# Patient Record
Sex: Female | Born: 1975 | Race: Black or African American | Hispanic: No | Marital: Single | Smoking: Never smoker
Health system: Southern US, Community
[De-identification: ages and names within clinical notes are randomized; demographics above are authoritative.]

## PROBLEM LIST (undated history)

## (undated) DIAGNOSIS — Z6830 Body mass index (BMI) 30.0-30.9, adult: Secondary | ICD-10-CM

## (undated) DIAGNOSIS — F419 Anxiety disorder, unspecified: Secondary | ICD-10-CM

## (undated) DIAGNOSIS — D259 Leiomyoma of uterus, unspecified: Secondary | ICD-10-CM

## (undated) DIAGNOSIS — G35 Multiple sclerosis: Secondary | ICD-10-CM

## (undated) DIAGNOSIS — F32A Depression, unspecified: Secondary | ICD-10-CM

## (undated) DIAGNOSIS — I10 Essential (primary) hypertension: Secondary | ICD-10-CM

## (undated) DIAGNOSIS — G35D Multiple sclerosis, unspecified: Secondary | ICD-10-CM

## (undated) HISTORY — PX: ABDOMINAL HYSTERECTOMY: SHX81

## (undated) HISTORY — DX: Depression, unspecified: F32.A

## (undated) HISTORY — DX: Anxiety disorder, unspecified: F41.9

---

## 1997-10-19 ENCOUNTER — Emergency Department (HOSPITAL_COMMUNITY): Admission: EM | Admit: 1997-10-19 | Discharge: 1997-10-19 | Payer: Self-pay | Admitting: Emergency Medicine

## 1998-06-25 ENCOUNTER — Inpatient Hospital Stay (HOSPITAL_COMMUNITY): Admission: AD | Admit: 1998-06-25 | Discharge: 1998-06-25 | Payer: Self-pay | Admitting: Obstetrics & Gynecology

## 1998-06-26 ENCOUNTER — Inpatient Hospital Stay (HOSPITAL_COMMUNITY): Admission: RE | Admit: 1998-06-26 | Discharge: 1998-06-26 | Payer: Self-pay | Admitting: *Deleted

## 1998-07-14 ENCOUNTER — Encounter (HOSPITAL_COMMUNITY): Admission: RE | Admit: 1998-07-14 | Discharge: 1998-10-12 | Payer: Self-pay | Admitting: *Deleted

## 1998-08-24 ENCOUNTER — Inpatient Hospital Stay (HOSPITAL_COMMUNITY): Admission: AD | Admit: 1998-08-24 | Discharge: 1998-08-24 | Payer: Self-pay | Admitting: Obstetrics & Gynecology

## 1998-10-06 ENCOUNTER — Encounter: Payer: Self-pay | Admitting: *Deleted

## 1998-10-10 ENCOUNTER — Encounter: Payer: Self-pay | Admitting: Endocrinology

## 1998-10-10 ENCOUNTER — Emergency Department (HOSPITAL_COMMUNITY): Admission: EM | Admit: 1998-10-10 | Discharge: 1998-10-10 | Payer: Self-pay | Admitting: Endocrinology

## 1998-10-20 ENCOUNTER — Observation Stay (HOSPITAL_COMMUNITY): Admission: AD | Admit: 1998-10-20 | Discharge: 1998-10-21 | Payer: Self-pay | Admitting: *Deleted

## 1998-10-27 ENCOUNTER — Inpatient Hospital Stay (HOSPITAL_COMMUNITY): Admission: RE | Admit: 1998-10-27 | Discharge: 1998-10-27 | Payer: Self-pay | Admitting: *Deleted

## 1998-11-03 ENCOUNTER — Inpatient Hospital Stay (HOSPITAL_COMMUNITY): Admission: AD | Admit: 1998-11-03 | Discharge: 1998-11-05 | Payer: Self-pay | Admitting: *Deleted

## 1999-02-16 ENCOUNTER — Emergency Department (HOSPITAL_COMMUNITY): Admission: EM | Admit: 1999-02-16 | Discharge: 1999-02-16 | Payer: Self-pay | Admitting: Emergency Medicine

## 1999-02-16 ENCOUNTER — Encounter: Payer: Self-pay | Admitting: Emergency Medicine

## 1999-02-18 ENCOUNTER — Emergency Department (HOSPITAL_COMMUNITY): Admission: EM | Admit: 1999-02-18 | Discharge: 1999-02-18 | Payer: Self-pay | Admitting: Emergency Medicine

## 1999-03-15 ENCOUNTER — Inpatient Hospital Stay (HOSPITAL_COMMUNITY): Admission: AD | Admit: 1999-03-15 | Discharge: 1999-03-15 | Payer: Self-pay | Admitting: Obstetrics

## 1999-04-14 ENCOUNTER — Inpatient Hospital Stay (HOSPITAL_COMMUNITY): Admission: AD | Admit: 1999-04-14 | Discharge: 1999-04-14 | Payer: Self-pay | Admitting: *Deleted

## 1999-06-02 ENCOUNTER — Inpatient Hospital Stay (HOSPITAL_COMMUNITY): Admission: AD | Admit: 1999-06-02 | Discharge: 1999-06-02 | Payer: Self-pay | Admitting: Obstetrics

## 1999-09-14 ENCOUNTER — Emergency Department (HOSPITAL_COMMUNITY): Admission: EM | Admit: 1999-09-14 | Discharge: 1999-09-14 | Payer: Self-pay | Admitting: Emergency Medicine

## 1999-09-14 ENCOUNTER — Encounter: Payer: Self-pay | Admitting: Emergency Medicine

## 1999-09-22 ENCOUNTER — Inpatient Hospital Stay (HOSPITAL_COMMUNITY): Admission: AD | Admit: 1999-09-22 | Discharge: 1999-09-22 | Payer: Self-pay | Admitting: Obstetrics

## 1999-10-28 ENCOUNTER — Inpatient Hospital Stay (HOSPITAL_COMMUNITY): Admission: AD | Admit: 1999-10-28 | Discharge: 1999-10-28 | Payer: Self-pay | Admitting: *Deleted

## 1999-12-04 ENCOUNTER — Emergency Department (HOSPITAL_COMMUNITY): Admission: EM | Admit: 1999-12-04 | Discharge: 1999-12-04 | Payer: Self-pay | Admitting: Emergency Medicine

## 2000-02-25 ENCOUNTER — Encounter (INDEPENDENT_AMBULATORY_CARE_PROVIDER_SITE_OTHER): Payer: Self-pay

## 2000-02-25 ENCOUNTER — Other Ambulatory Visit: Admission: RE | Admit: 2000-02-25 | Discharge: 2000-02-25 | Payer: Self-pay | Admitting: Obstetrics

## 2000-04-08 ENCOUNTER — Inpatient Hospital Stay (HOSPITAL_COMMUNITY): Admission: AD | Admit: 2000-04-08 | Discharge: 2000-04-08 | Payer: Self-pay | Admitting: Obstetrics

## 2000-05-05 ENCOUNTER — Emergency Department (HOSPITAL_COMMUNITY): Admission: EM | Admit: 2000-05-05 | Discharge: 2000-05-05 | Payer: Self-pay | Admitting: Emergency Medicine

## 2000-09-09 ENCOUNTER — Inpatient Hospital Stay (HOSPITAL_COMMUNITY): Admission: AD | Admit: 2000-09-09 | Discharge: 2000-09-09 | Payer: Self-pay | Admitting: *Deleted

## 2000-09-12 ENCOUNTER — Inpatient Hospital Stay (HOSPITAL_COMMUNITY): Admission: AD | Admit: 2000-09-12 | Discharge: 2000-09-12 | Payer: Self-pay | Admitting: *Deleted

## 2000-10-27 ENCOUNTER — Emergency Department (HOSPITAL_COMMUNITY): Admission: EM | Admit: 2000-10-27 | Discharge: 2000-10-27 | Payer: Self-pay | Admitting: Emergency Medicine

## 2000-11-16 ENCOUNTER — Inpatient Hospital Stay (HOSPITAL_COMMUNITY): Admission: AD | Admit: 2000-11-16 | Discharge: 2000-11-16 | Payer: Self-pay | Admitting: Obstetrics & Gynecology

## 2000-11-27 ENCOUNTER — Inpatient Hospital Stay (HOSPITAL_COMMUNITY): Admission: AD | Admit: 2000-11-27 | Discharge: 2000-11-27 | Payer: Self-pay | Admitting: Obstetrics

## 2000-12-11 ENCOUNTER — Inpatient Hospital Stay (HOSPITAL_COMMUNITY): Admission: AD | Admit: 2000-12-11 | Discharge: 2000-12-11 | Payer: Self-pay | Admitting: Obstetrics

## 2001-03-01 ENCOUNTER — Inpatient Hospital Stay (HOSPITAL_COMMUNITY): Admission: AD | Admit: 2001-03-01 | Discharge: 2001-03-04 | Payer: Self-pay | Admitting: *Deleted

## 2001-03-01 ENCOUNTER — Encounter: Payer: Self-pay | Admitting: *Deleted

## 2001-03-09 ENCOUNTER — Encounter: Payer: Self-pay | Admitting: *Deleted

## 2001-03-09 ENCOUNTER — Inpatient Hospital Stay (HOSPITAL_COMMUNITY): Admission: AD | Admit: 2001-03-09 | Discharge: 2001-03-09 | Payer: Self-pay | Admitting: *Deleted

## 2001-03-22 ENCOUNTER — Encounter (INDEPENDENT_AMBULATORY_CARE_PROVIDER_SITE_OTHER): Payer: Self-pay

## 2001-03-22 ENCOUNTER — Inpatient Hospital Stay (HOSPITAL_COMMUNITY): Admission: AD | Admit: 2001-03-22 | Discharge: 2001-04-01 | Payer: Self-pay | Admitting: Obstetrics

## 2001-03-22 ENCOUNTER — Encounter: Payer: Self-pay | Admitting: Obstetrics

## 2001-04-20 ENCOUNTER — Emergency Department (HOSPITAL_COMMUNITY): Admission: EM | Admit: 2001-04-20 | Discharge: 2001-04-20 | Payer: Self-pay | Admitting: Emergency Medicine

## 2001-05-02 ENCOUNTER — Emergency Department (HOSPITAL_COMMUNITY): Admission: EM | Admit: 2001-05-02 | Discharge: 2001-05-02 | Payer: Self-pay

## 2001-08-07 ENCOUNTER — Emergency Department (HOSPITAL_COMMUNITY): Admission: EM | Admit: 2001-08-07 | Discharge: 2001-08-07 | Payer: Self-pay | Admitting: Emergency Medicine

## 2001-09-02 ENCOUNTER — Emergency Department (HOSPITAL_COMMUNITY): Admission: EM | Admit: 2001-09-02 | Discharge: 2001-09-02 | Payer: Self-pay | Admitting: Emergency Medicine

## 2001-10-17 ENCOUNTER — Emergency Department (HOSPITAL_COMMUNITY): Admission: EM | Admit: 2001-10-17 | Discharge: 2001-10-17 | Payer: Self-pay | Admitting: Emergency Medicine

## 2001-11-02 ENCOUNTER — Emergency Department (HOSPITAL_COMMUNITY): Admission: EM | Admit: 2001-11-02 | Discharge: 2001-11-02 | Payer: Self-pay | Admitting: Emergency Medicine

## 2001-12-06 ENCOUNTER — Emergency Department (HOSPITAL_COMMUNITY): Admission: EM | Admit: 2001-12-06 | Discharge: 2001-12-06 | Payer: Self-pay | Admitting: Emergency Medicine

## 2002-02-22 ENCOUNTER — Inpatient Hospital Stay (HOSPITAL_COMMUNITY): Admission: AD | Admit: 2002-02-22 | Discharge: 2002-02-22 | Payer: Self-pay | Admitting: Family Medicine

## 2002-03-11 ENCOUNTER — Inpatient Hospital Stay (HOSPITAL_COMMUNITY): Admission: AD | Admit: 2002-03-11 | Discharge: 2002-03-11 | Payer: Self-pay | Admitting: *Deleted

## 2002-03-23 ENCOUNTER — Inpatient Hospital Stay (HOSPITAL_COMMUNITY): Admission: AD | Admit: 2002-03-23 | Discharge: 2002-03-23 | Payer: Self-pay | Admitting: *Deleted

## 2002-04-15 ENCOUNTER — Inpatient Hospital Stay (HOSPITAL_COMMUNITY): Admission: AD | Admit: 2002-04-15 | Discharge: 2002-04-15 | Payer: Self-pay | Admitting: *Deleted

## 2002-04-23 ENCOUNTER — Encounter (HOSPITAL_COMMUNITY): Admission: RE | Admit: 2002-04-23 | Discharge: 2002-05-23 | Payer: Self-pay | Admitting: *Deleted

## 2002-05-25 ENCOUNTER — Encounter: Payer: Self-pay | Admitting: *Deleted

## 2002-05-25 ENCOUNTER — Inpatient Hospital Stay (HOSPITAL_COMMUNITY): Admission: AD | Admit: 2002-05-25 | Discharge: 2002-05-25 | Payer: Self-pay | Admitting: Family Medicine

## 2002-06-11 ENCOUNTER — Emergency Department (HOSPITAL_COMMUNITY): Admission: EM | Admit: 2002-06-11 | Discharge: 2002-06-11 | Payer: Self-pay | Admitting: Emergency Medicine

## 2002-06-11 ENCOUNTER — Encounter: Payer: Self-pay | Admitting: Emergency Medicine

## 2002-06-12 ENCOUNTER — Ambulatory Visit (HOSPITAL_COMMUNITY): Admission: RE | Admit: 2002-06-12 | Discharge: 2002-06-12 | Payer: Self-pay | Admitting: *Deleted

## 2002-06-12 ENCOUNTER — Encounter: Admission: RE | Admit: 2002-06-12 | Discharge: 2002-06-12 | Payer: Self-pay | Admitting: *Deleted

## 2002-06-19 ENCOUNTER — Encounter: Admission: RE | Admit: 2002-06-19 | Discharge: 2002-06-19 | Payer: Self-pay | Admitting: *Deleted

## 2002-06-26 ENCOUNTER — Encounter: Admission: RE | Admit: 2002-06-26 | Discharge: 2002-06-26 | Payer: Self-pay | Admitting: *Deleted

## 2002-06-27 ENCOUNTER — Inpatient Hospital Stay (HOSPITAL_COMMUNITY): Admission: AD | Admit: 2002-06-27 | Discharge: 2002-06-27 | Payer: Self-pay | Admitting: *Deleted

## 2002-06-28 ENCOUNTER — Inpatient Hospital Stay (HOSPITAL_COMMUNITY): Admission: AD | Admit: 2002-06-28 | Discharge: 2002-06-28 | Payer: Self-pay | Admitting: *Deleted

## 2002-07-11 ENCOUNTER — Encounter: Admission: RE | Admit: 2002-07-11 | Discharge: 2002-07-11 | Payer: Self-pay | Admitting: *Deleted

## 2002-08-01 ENCOUNTER — Encounter: Admission: RE | Admit: 2002-08-01 | Discharge: 2002-08-01 | Payer: Self-pay | Admitting: *Deleted

## 2002-08-15 ENCOUNTER — Encounter: Admission: RE | Admit: 2002-08-15 | Discharge: 2002-08-15 | Payer: Self-pay | Admitting: *Deleted

## 2002-08-17 ENCOUNTER — Inpatient Hospital Stay (HOSPITAL_COMMUNITY): Admission: AD | Admit: 2002-08-17 | Discharge: 2002-08-17 | Payer: Self-pay | Admitting: *Deleted

## 2002-08-17 ENCOUNTER — Encounter: Payer: Self-pay | Admitting: *Deleted

## 2002-08-19 ENCOUNTER — Inpatient Hospital Stay (HOSPITAL_COMMUNITY): Admission: AD | Admit: 2002-08-19 | Discharge: 2002-08-19 | Payer: Self-pay | Admitting: Obstetrics and Gynecology

## 2002-08-29 ENCOUNTER — Encounter (HOSPITAL_COMMUNITY): Admission: RE | Admit: 2002-08-29 | Discharge: 2002-09-28 | Payer: Self-pay | Admitting: *Deleted

## 2002-08-29 ENCOUNTER — Encounter: Admission: RE | Admit: 2002-08-29 | Discharge: 2002-08-29 | Payer: Self-pay | Admitting: *Deleted

## 2002-09-05 ENCOUNTER — Inpatient Hospital Stay (HOSPITAL_COMMUNITY): Admission: AD | Admit: 2002-09-05 | Discharge: 2002-09-05 | Payer: Self-pay | Admitting: Family Medicine

## 2002-09-12 ENCOUNTER — Encounter: Admission: RE | Admit: 2002-09-12 | Discharge: 2002-09-12 | Payer: Self-pay | Admitting: Family Medicine

## 2002-09-20 ENCOUNTER — Inpatient Hospital Stay (HOSPITAL_COMMUNITY): Admission: AD | Admit: 2002-09-20 | Discharge: 2002-09-20 | Payer: Self-pay | Admitting: *Deleted

## 2002-09-23 ENCOUNTER — Inpatient Hospital Stay (HOSPITAL_COMMUNITY): Admission: AD | Admit: 2002-09-23 | Discharge: 2002-09-23 | Payer: Self-pay | Admitting: Family Medicine

## 2002-09-26 ENCOUNTER — Encounter: Admission: RE | Admit: 2002-09-26 | Discharge: 2002-09-26 | Payer: Self-pay | Admitting: Family Medicine

## 2002-10-03 ENCOUNTER — Encounter: Admission: RE | Admit: 2002-10-03 | Discharge: 2002-10-03 | Payer: Self-pay | Admitting: Family Medicine

## 2002-10-04 ENCOUNTER — Inpatient Hospital Stay (HOSPITAL_COMMUNITY): Admission: AD | Admit: 2002-10-04 | Discharge: 2002-10-04 | Payer: Self-pay | Admitting: Family Medicine

## 2002-10-04 ENCOUNTER — Encounter: Payer: Self-pay | Admitting: Family Medicine

## 2002-10-06 ENCOUNTER — Inpatient Hospital Stay (HOSPITAL_COMMUNITY): Admission: AD | Admit: 2002-10-06 | Discharge: 2002-10-06 | Payer: Self-pay | Admitting: Family Medicine

## 2002-10-08 ENCOUNTER — Inpatient Hospital Stay (HOSPITAL_COMMUNITY): Admission: AD | Admit: 2002-10-08 | Discharge: 2002-10-08 | Payer: Self-pay | Admitting: *Deleted

## 2002-10-10 ENCOUNTER — Encounter: Admission: RE | Admit: 2002-10-10 | Discharge: 2002-10-10 | Payer: Self-pay | Admitting: *Deleted

## 2002-10-17 ENCOUNTER — Encounter: Admission: RE | Admit: 2002-10-17 | Discharge: 2002-10-17 | Payer: Self-pay | Admitting: Family Medicine

## 2002-10-24 ENCOUNTER — Encounter: Admission: RE | Admit: 2002-10-24 | Discharge: 2002-10-24 | Payer: Self-pay | Admitting: Family Medicine

## 2002-10-27 ENCOUNTER — Inpatient Hospital Stay (HOSPITAL_COMMUNITY): Admission: AD | Admit: 2002-10-27 | Discharge: 2002-10-27 | Payer: Self-pay | Admitting: *Deleted

## 2002-10-28 ENCOUNTER — Encounter: Admission: RE | Admit: 2002-10-28 | Discharge: 2002-10-28 | Payer: Self-pay | Admitting: *Deleted

## 2002-10-31 ENCOUNTER — Encounter (INDEPENDENT_AMBULATORY_CARE_PROVIDER_SITE_OTHER): Payer: Self-pay | Admitting: *Deleted

## 2002-10-31 ENCOUNTER — Encounter: Admission: RE | Admit: 2002-10-31 | Discharge: 2002-10-31 | Payer: Self-pay | Admitting: *Deleted

## 2002-10-31 ENCOUNTER — Inpatient Hospital Stay (HOSPITAL_COMMUNITY): Admission: AD | Admit: 2002-10-31 | Discharge: 2002-11-03 | Payer: Self-pay | Admitting: Obstetrics & Gynecology

## 2003-04-02 ENCOUNTER — Inpatient Hospital Stay (HOSPITAL_COMMUNITY): Admission: AD | Admit: 2003-04-02 | Discharge: 2003-04-02 | Payer: Self-pay | Admitting: *Deleted

## 2003-06-18 ENCOUNTER — Emergency Department (HOSPITAL_COMMUNITY): Admission: EM | Admit: 2003-06-18 | Discharge: 2003-06-19 | Payer: Self-pay | Admitting: Emergency Medicine

## 2003-06-20 ENCOUNTER — Emergency Department (HOSPITAL_COMMUNITY): Admission: EM | Admit: 2003-06-20 | Discharge: 2003-06-21 | Payer: Self-pay | Admitting: Emergency Medicine

## 2003-07-18 ENCOUNTER — Emergency Department (HOSPITAL_COMMUNITY): Admission: EM | Admit: 2003-07-18 | Discharge: 2003-07-19 | Payer: Self-pay | Admitting: Emergency Medicine

## 2003-07-19 ENCOUNTER — Emergency Department (HOSPITAL_COMMUNITY): Admission: AD | Admit: 2003-07-19 | Discharge: 2003-07-19 | Payer: Self-pay

## 2003-09-16 ENCOUNTER — Inpatient Hospital Stay (HOSPITAL_COMMUNITY): Admission: AD | Admit: 2003-09-16 | Discharge: 2003-09-16 | Payer: Self-pay | Admitting: *Deleted

## 2003-09-16 IMAGING — US US OB TRANSVAGINAL MODIFY
1 series · 18 of 28 positions shown · non-contrast
Comparison: none

CLINICAL DATA: Pelvic pain radiating to back.  Positive home pregnancy test.  4 week 4 day gestational age by LMP.
 OBSTETRICAL ULTRASOUND WITH TRANSVAGINAL
 The uterus is retroverted.  A single tiny intrauterine gestational sac is seen with a double decidual sac sign.  Mean sac diameter measures 3 mm, corresponding to a gestational age of 4 weeks 5 days.  There is no evidence of subchorionic hemorrhage.  A submucosal fibroid is seen in the posterior uterine wall measuring 1.7 x 1.3 x 1.1 cm.  No other fibroids are identified.
 The right ovary contains an avascular heterogeneous area measuring approximately 2 cm in greatest diameter, consistent with a hemorrhagic corpus luteum.  The left ovary is normal in appearance.  There is no evidence of other adnexal masses or free fluid.
 IMPRESSION 
 Single living intrauterine gestational sac with estimated gestational age of 4 weeks 5 days by mean sac diameter.  This correlates closely with stated LMP.  
 Retroverted uterus.  1.7 cm submucosal fibroid in the posterior uterine wall.  
 2 cm right ovarian corpus luteum.  No evidence of other adnexal masses or free fluid.

[Series 1: us ob comp<14 wk · 18 of 54 slices shown]
[im 1/54]
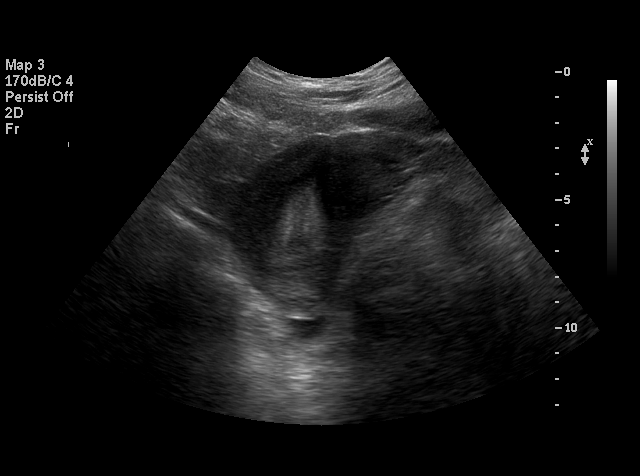
[im 4/54]
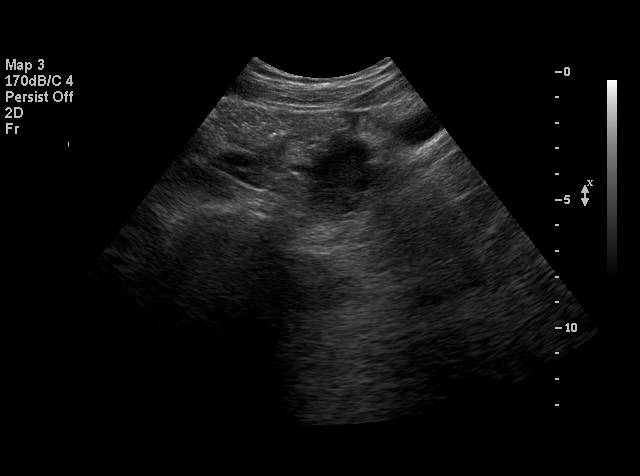
[im 6/54]
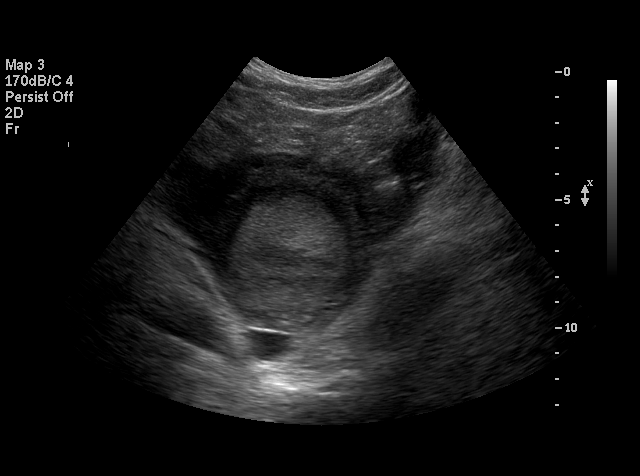
[im 10/54]
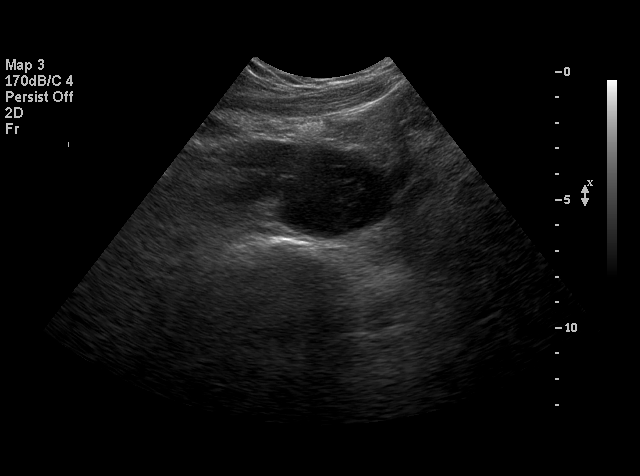
[im 14/54]
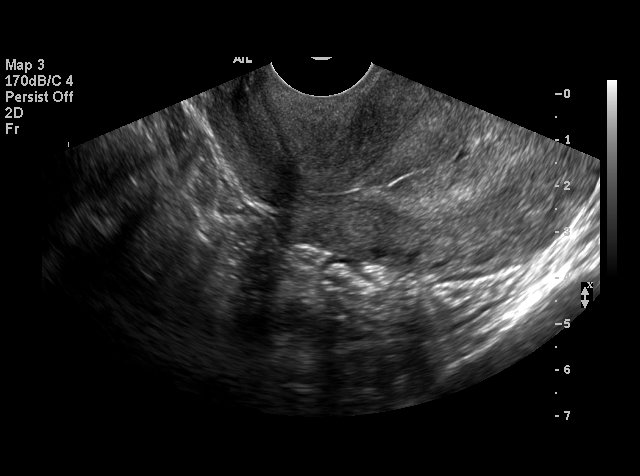
[im 16/54]
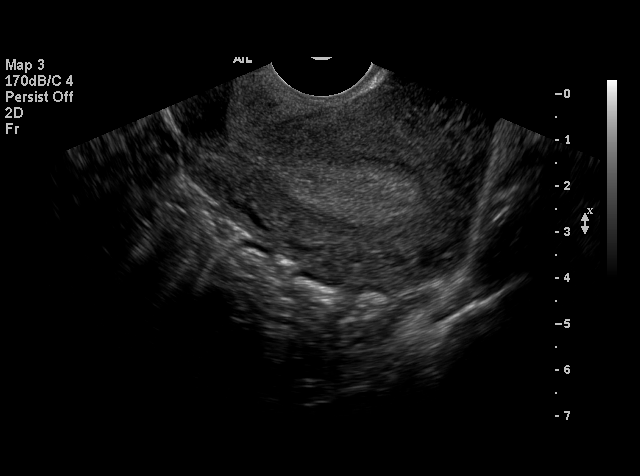
[im 20/54]
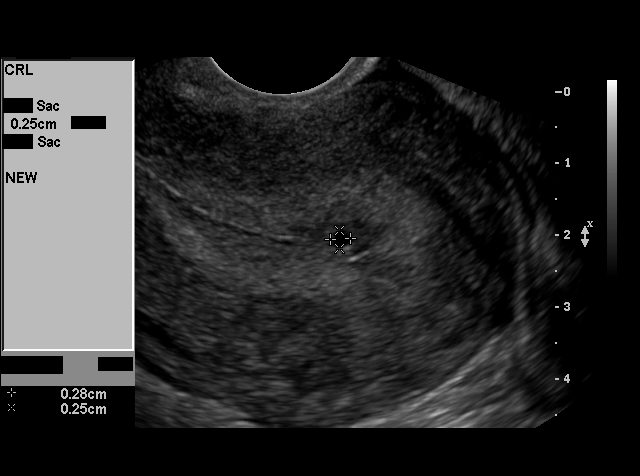
[im 22/54]
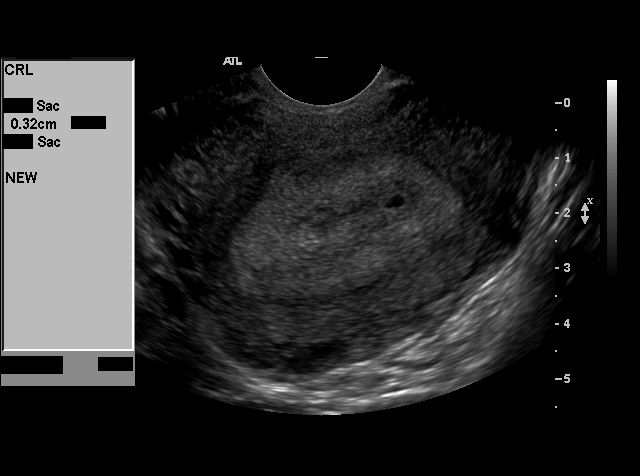
[im 26/54]
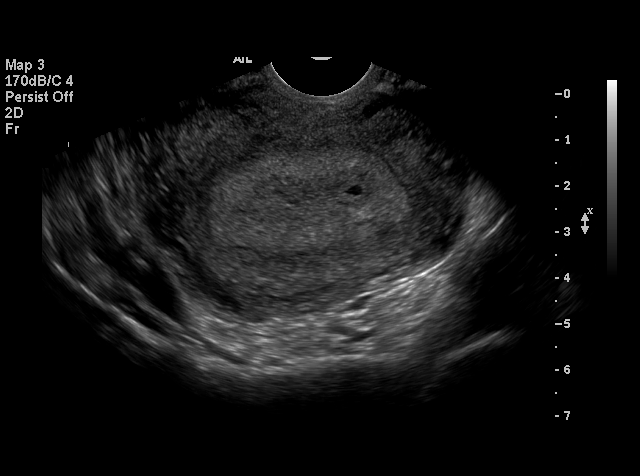
[im 28/54]
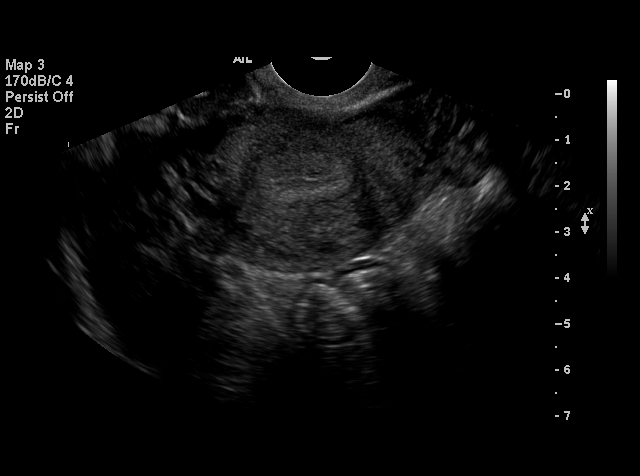
[im 32/54]
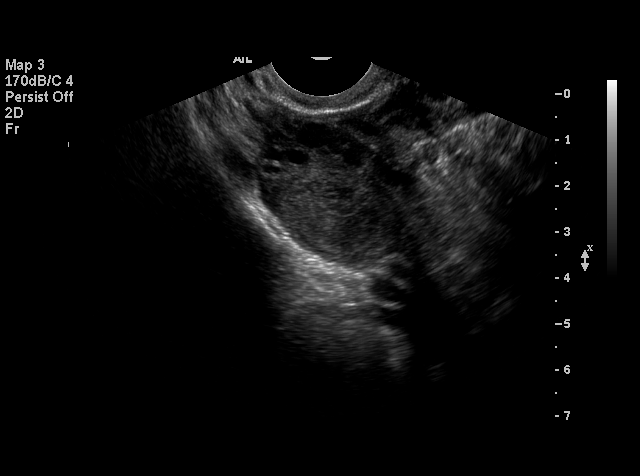
[im 34/54]
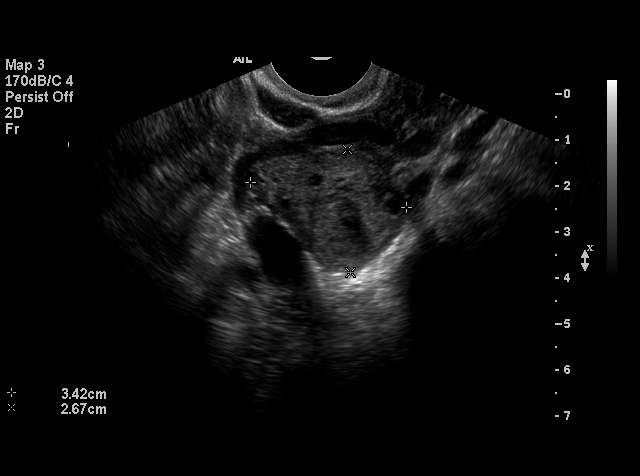
[im 38/54]
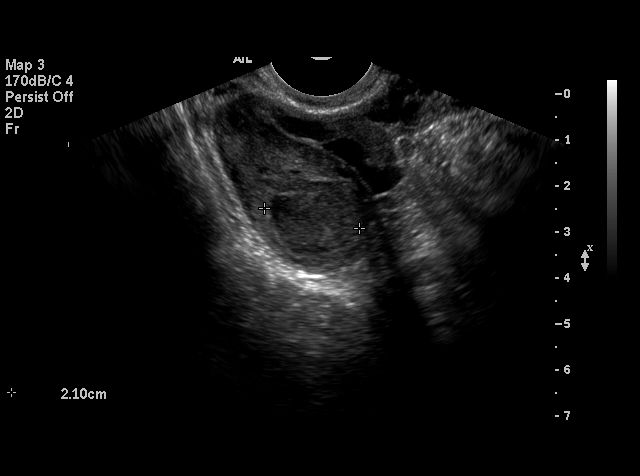
[im 42/54]
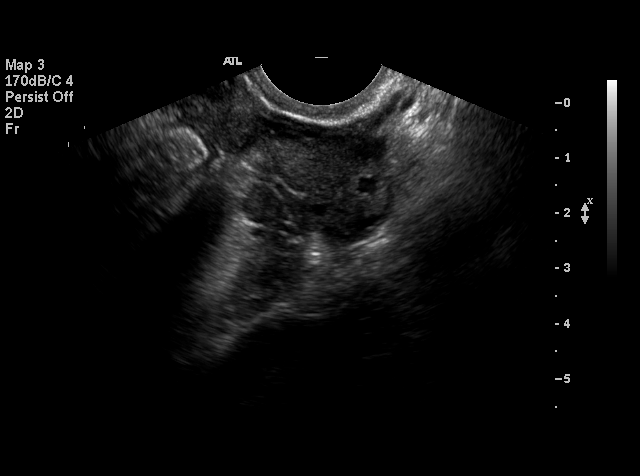
[im 44/54]
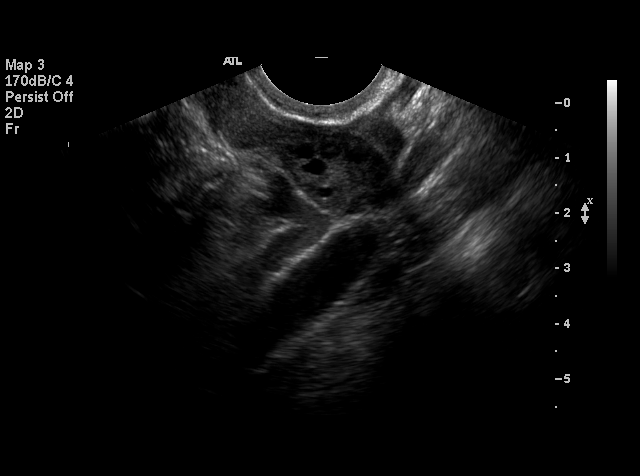
[im 48/54]
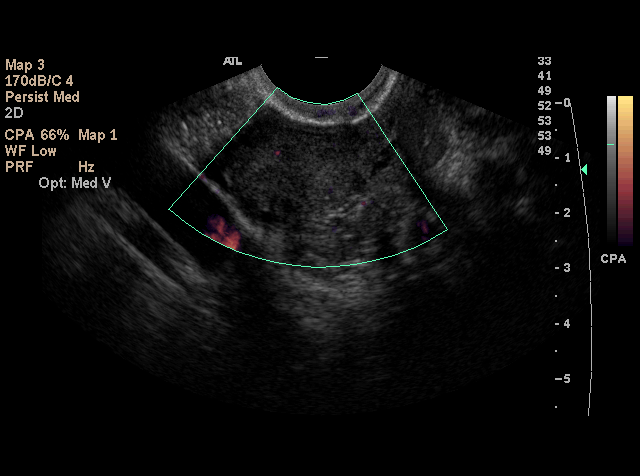
[im 50/54]
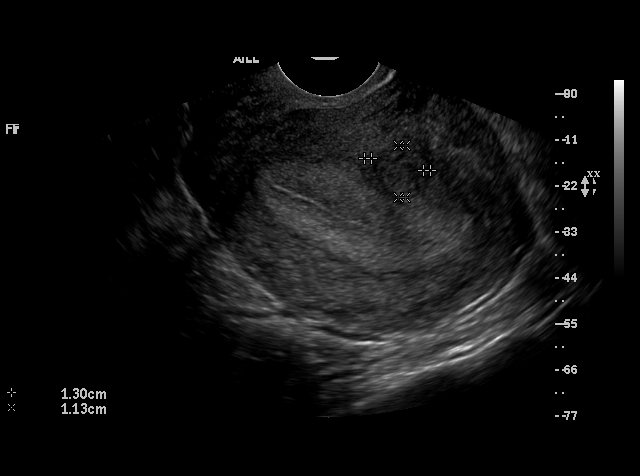
[im 54/54]
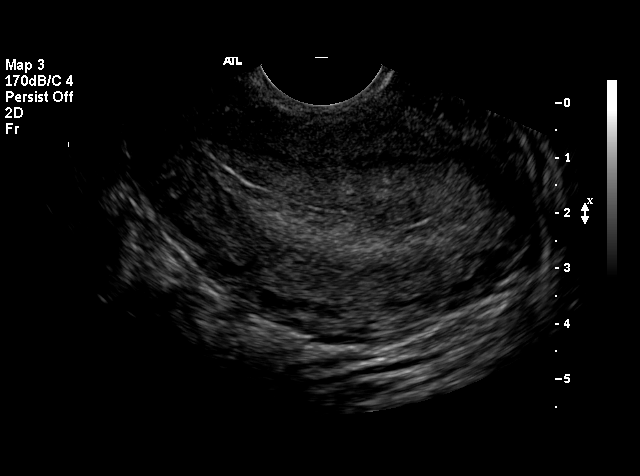

[18 of 28 positions shown; findings below may reference images not displayed]

## 2003-10-21 ENCOUNTER — Inpatient Hospital Stay (HOSPITAL_COMMUNITY): Admission: AD | Admit: 2003-10-21 | Discharge: 2003-10-21 | Payer: Self-pay | Admitting: *Deleted

## 2003-11-19 ENCOUNTER — Ambulatory Visit (HOSPITAL_COMMUNITY): Admission: RE | Admit: 2003-11-19 | Discharge: 2003-11-19 | Payer: Self-pay | Admitting: Obstetrics

## 2003-11-19 IMAGING — US US OB COMP LESS 14 WK
1 series · 18 of 28 positions shown · non-contrast
Comparison: none

CLINICAL DATA: o fetal heart tones on office exam.  Assess for fetal life.  G5 P4. LMP [DATE].
OBSTETRICAL ULTRASOUND

[Series 1: us ob comp less 14 wk · 18 of 54 slices shown]
[im 1/54]
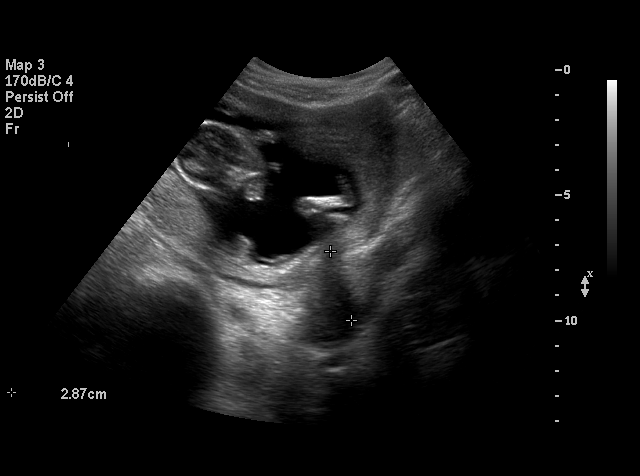
[im 4/54]
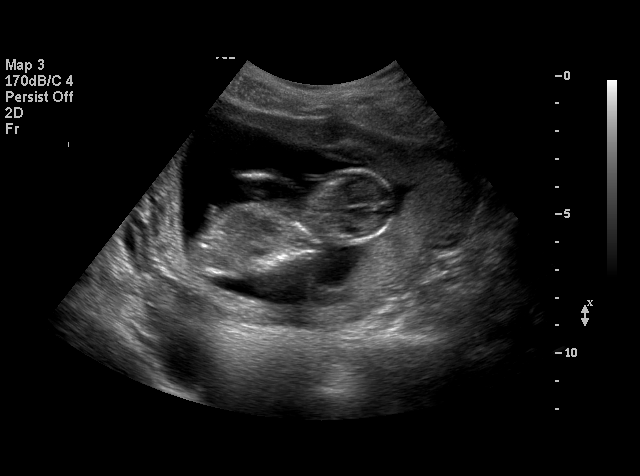
[im 6/54]
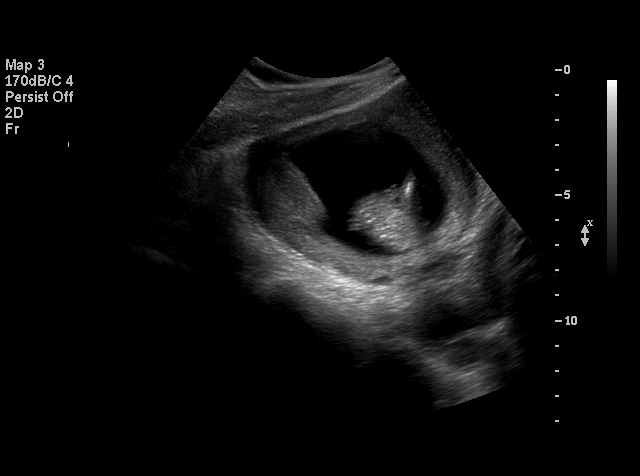
[im 10/54]
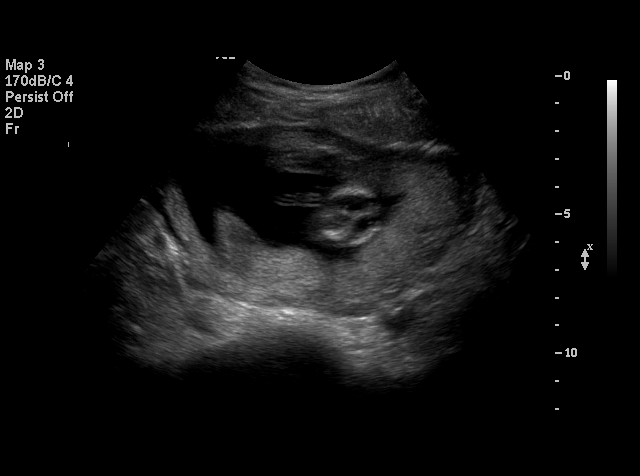
[im 14/54]
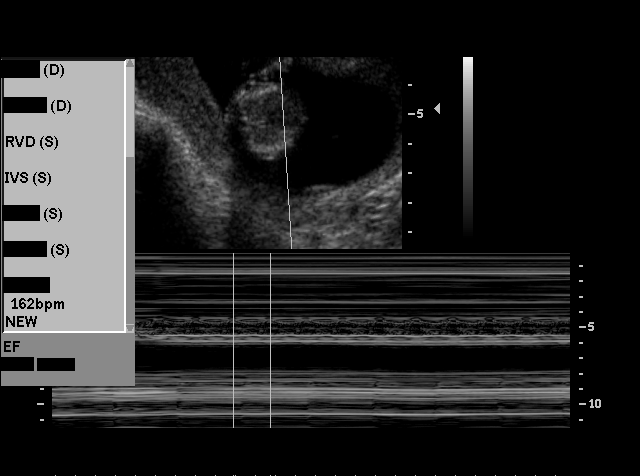
[im 16/54]
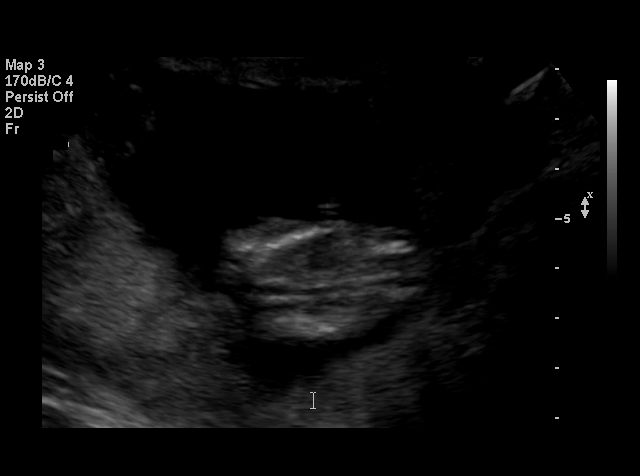
[im 20/54]
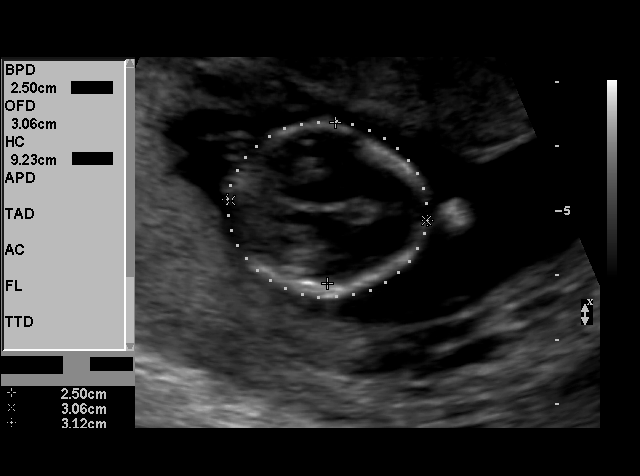
[im 22/54]
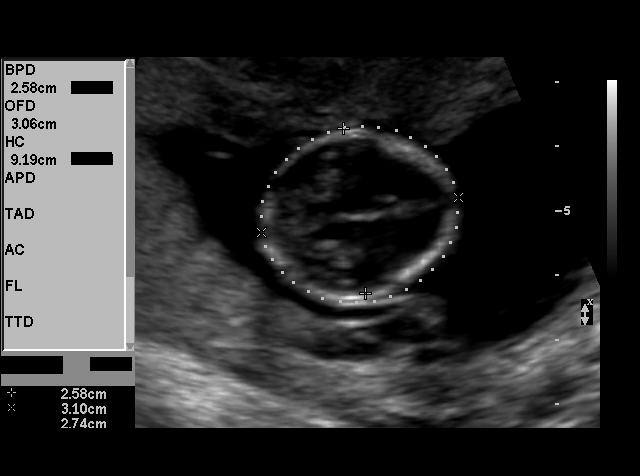
[im 26/54]
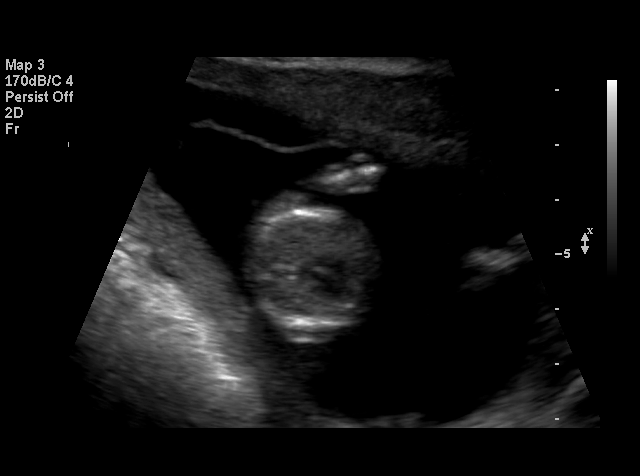
[im 28/54]
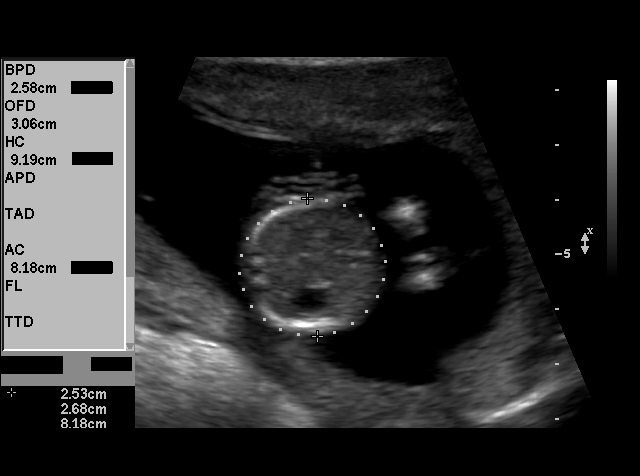
[im 32/54]
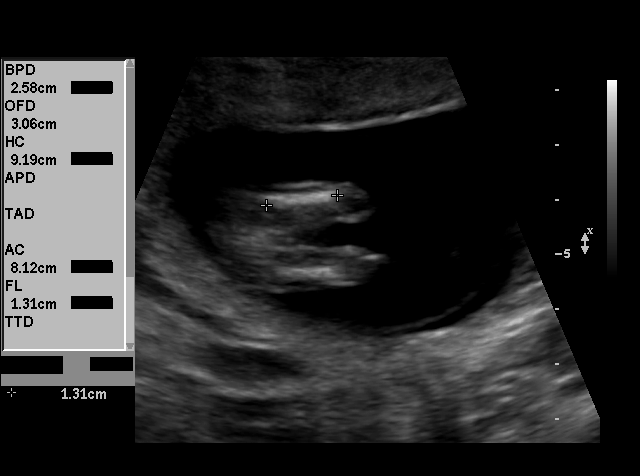
[im 34/54]
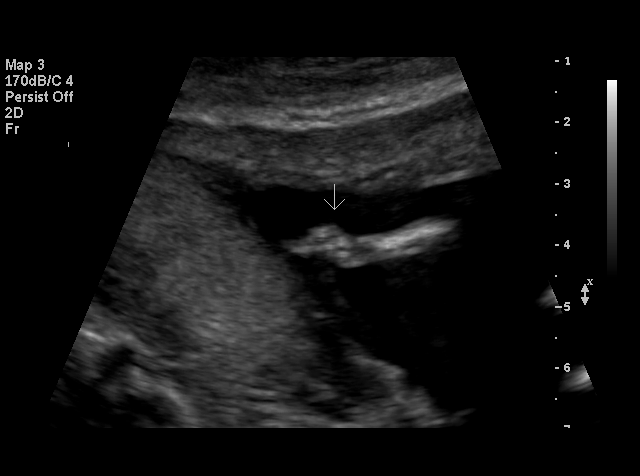
[im 38/54]
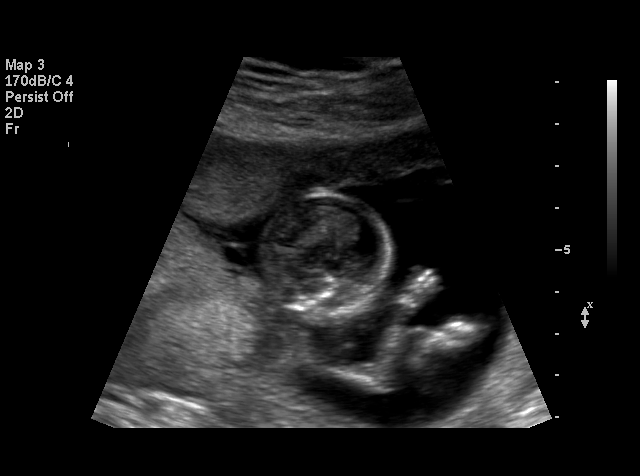
[im 42/54]
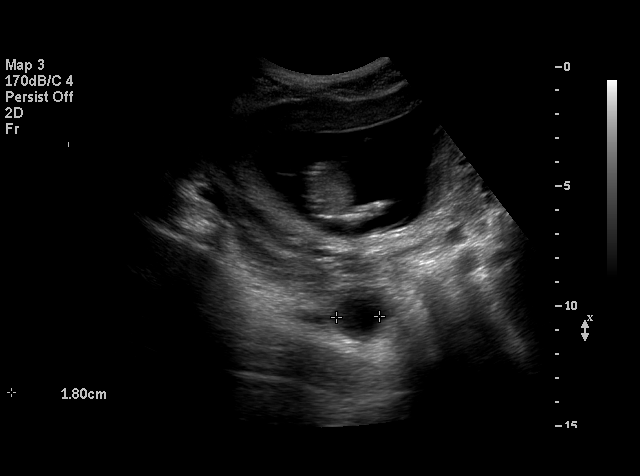
[im 44/54]
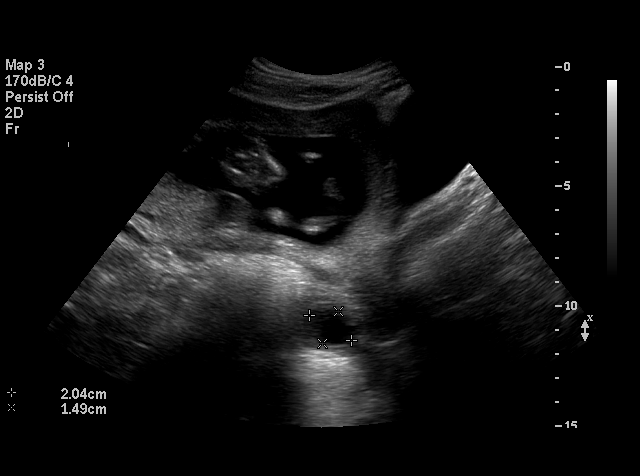
[im 48/54]
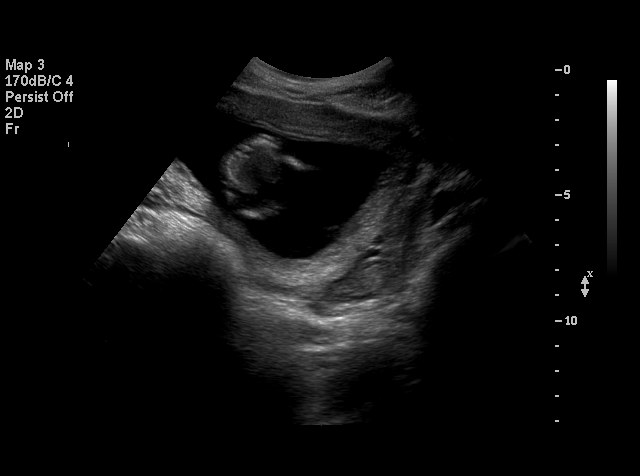
[im 50/54]
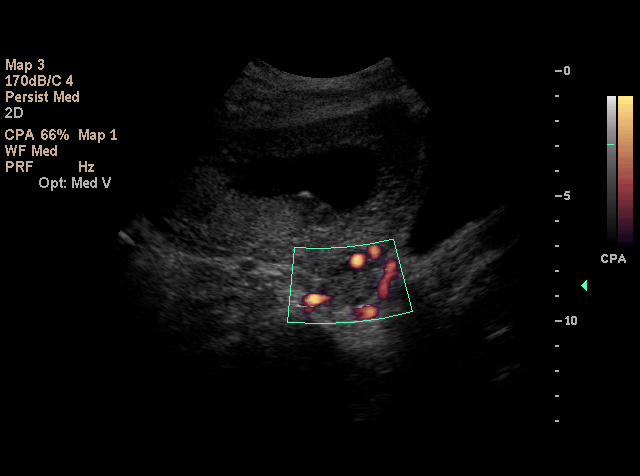
[im 54/54]
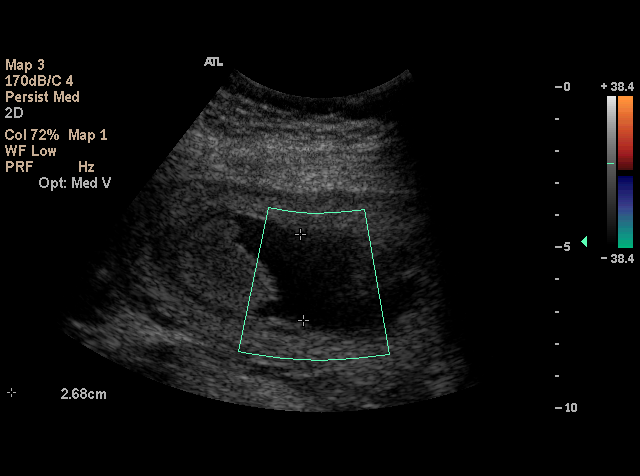

[18 of 28 positions shown; findings below may reference images not displayed]

Number of fetuses:  1
Heart Rate:  162
Movement:  Yes
Breathing:  No
Presentation:  Variable
Placental Location:  Fundal, posterior
Grade:  0
Previa:   No
Amniotic Fluid (Subjective):  Normal
Amniotic Fluid (Objective):  2.7 cm Vertical pocket 

FETAL BIOMETRY
BPD:  2.6 cm   14 w 4 d
HC:  9.3 cm   14 w 2 d
AC:  8.2 cm   14 w 4 d
FL:  1.3 cm   13 w 6 d

MEAN GA:  14 w 3 d

FETAL ANATOMY 
Lateral Ventricles:  CP visualized
Thalami/CSP:  Visualized 
Posterior Fossa:  Not visualized 
Nuchal Region:  Not visualized 
Spine:  Not visualized 
4 Chamber Heart on L:  Not visualized 
Stomach on L:  Visualized 
Three Vessel Cord:  Visualized 
Cord Insertion Site:  Not visualized 
Kidneys:  Not visualized 
Bladder:  Visualized 
Extremities:  Not visualized 

ADDITIONAL ANATOMY VISUALIZED:  Orbits

Evaluation limited by:  Early gestational age 

MATERNAL FINDINGS
Cervix:  Not evaluated
IMPRESSION: Single living intrauterine fetus in variable presentation.  Patient is 13 weeks 5 days by LMP dating and measures 14 weeks 3 days today which is within the measurement variation at this gestational age.
Limited anatomic survey due to early gestational age.
Right ovarian corpus luteum.  Normal left ovary.

## 2003-12-25 ENCOUNTER — Inpatient Hospital Stay (HOSPITAL_COMMUNITY): Admission: AD | Admit: 2003-12-25 | Discharge: 2003-12-25 | Payer: Self-pay | Admitting: Obstetrics

## 2004-03-15 ENCOUNTER — Inpatient Hospital Stay (HOSPITAL_COMMUNITY): Admission: AD | Admit: 2004-03-15 | Discharge: 2004-03-15 | Payer: Self-pay | Admitting: Obstetrics

## 2004-03-19 ENCOUNTER — Emergency Department (HOSPITAL_COMMUNITY): Admission: EM | Admit: 2004-03-19 | Discharge: 2004-03-19 | Payer: Self-pay | Admitting: Emergency Medicine

## 2004-03-22 ENCOUNTER — Inpatient Hospital Stay (HOSPITAL_COMMUNITY): Admission: AD | Admit: 2004-03-22 | Discharge: 2004-03-22 | Payer: Self-pay | Admitting: Obstetrics

## 2004-03-30 ENCOUNTER — Inpatient Hospital Stay (HOSPITAL_COMMUNITY): Admission: AD | Admit: 2004-03-30 | Discharge: 2004-03-30 | Payer: Self-pay | Admitting: Obstetrics

## 2004-04-22 ENCOUNTER — Emergency Department (HOSPITAL_COMMUNITY): Admission: EM | Admit: 2004-04-22 | Discharge: 2004-04-22 | Payer: Self-pay | Admitting: Emergency Medicine

## 2004-04-22 IMAGING — CR DG KNEE COMPLETE 4+V*R*
4 series · 4 of 4 positions shown · non-contrast
Comparison: none

CLINICAL DATA: Fall with pain

RIGHT KNEE - 4 VIEW:

[view not recorded (1 of 4)]
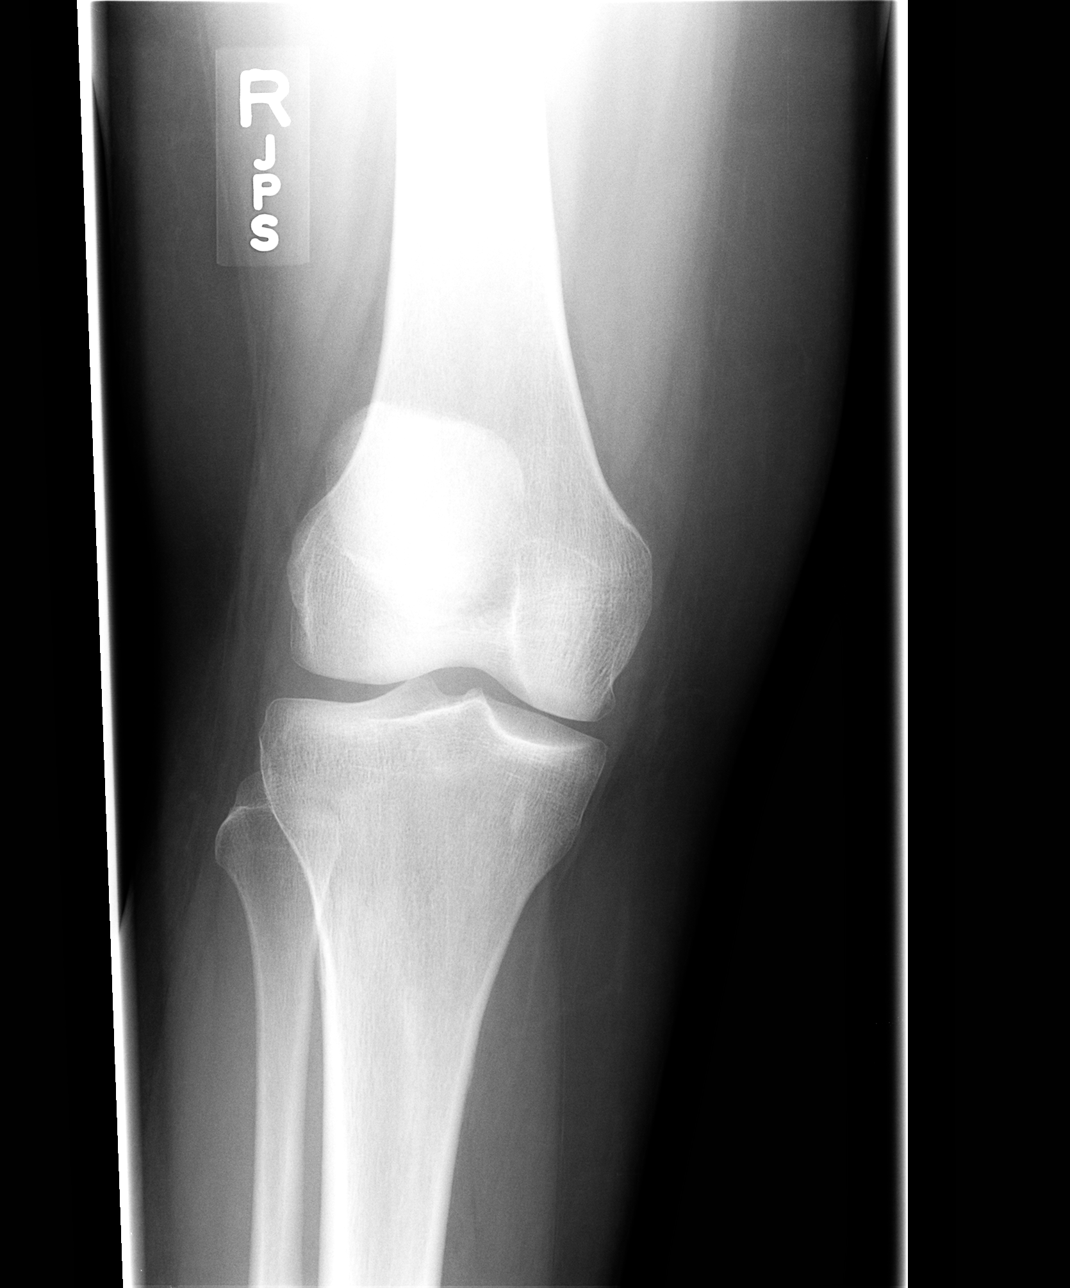

[view not recorded (2 of 4)]
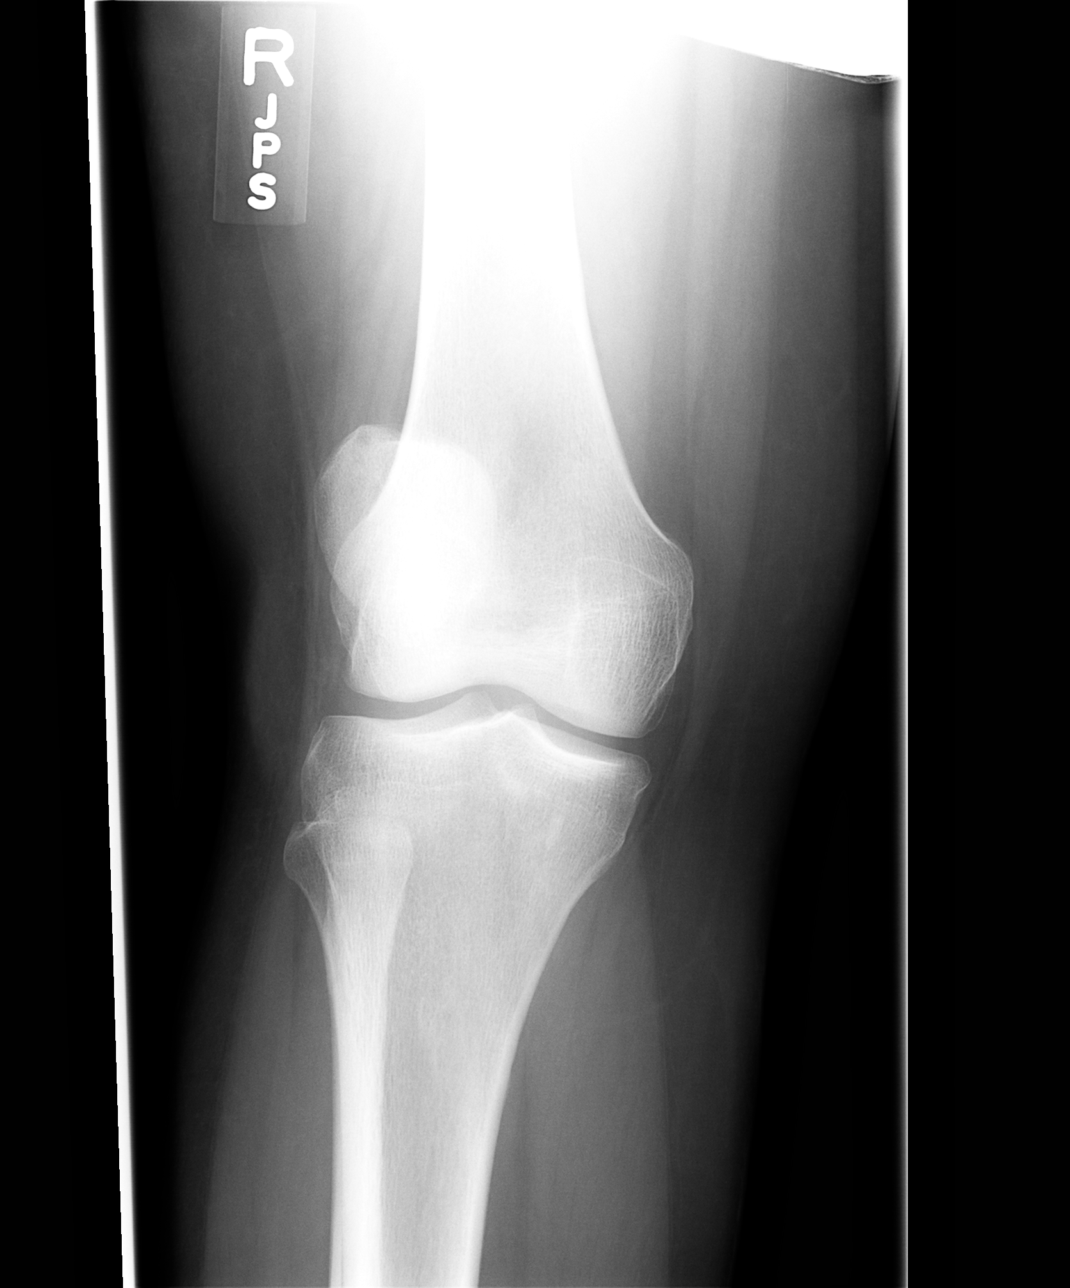

[view not recorded (3 of 4)]
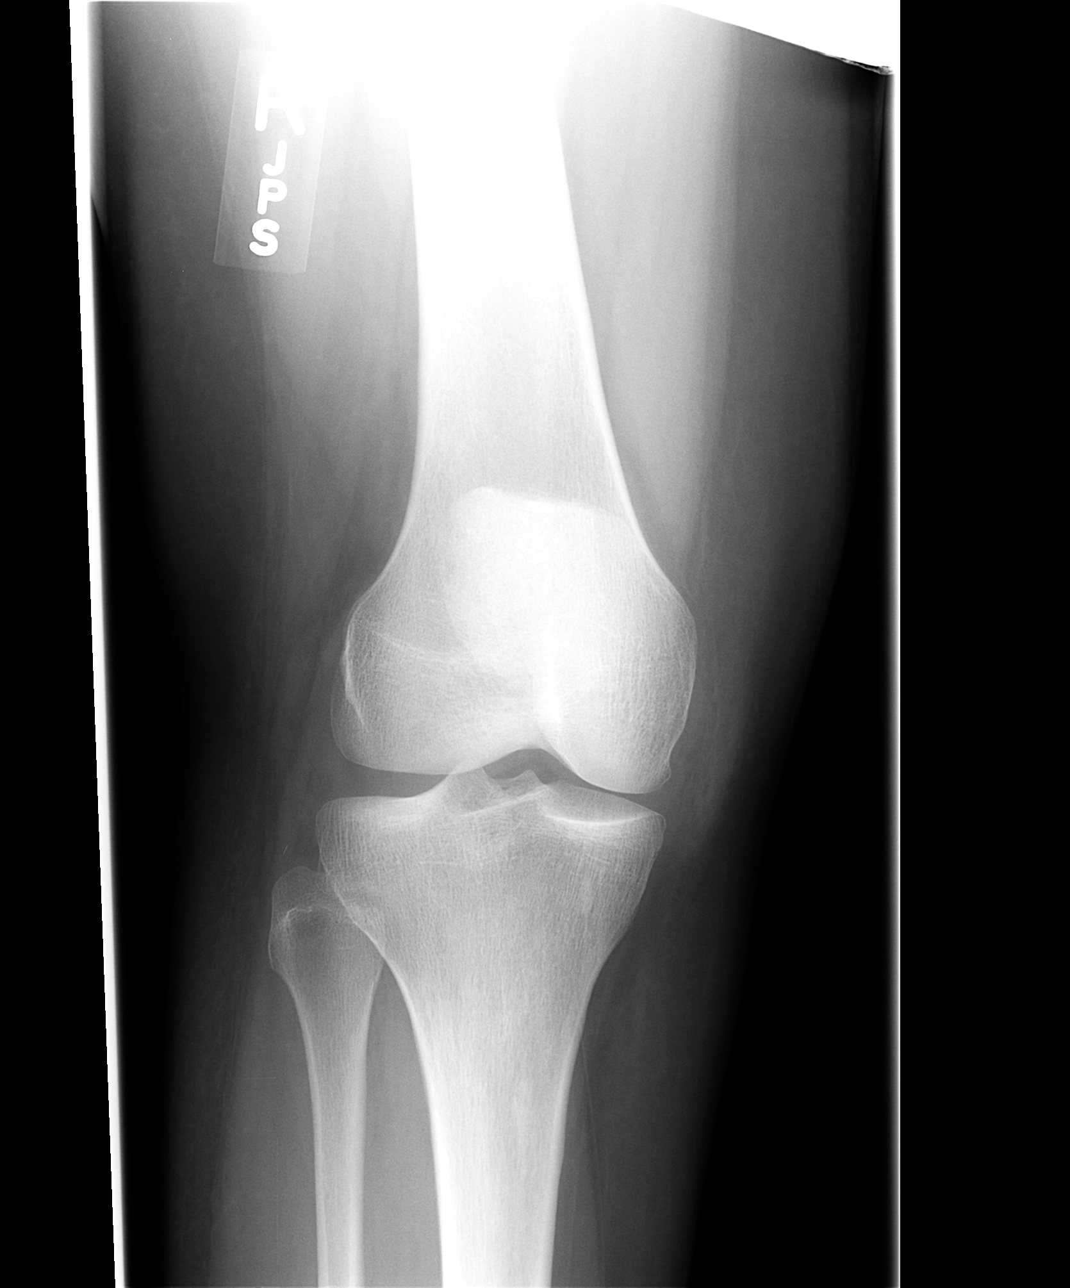

[view not recorded (4 of 4)]
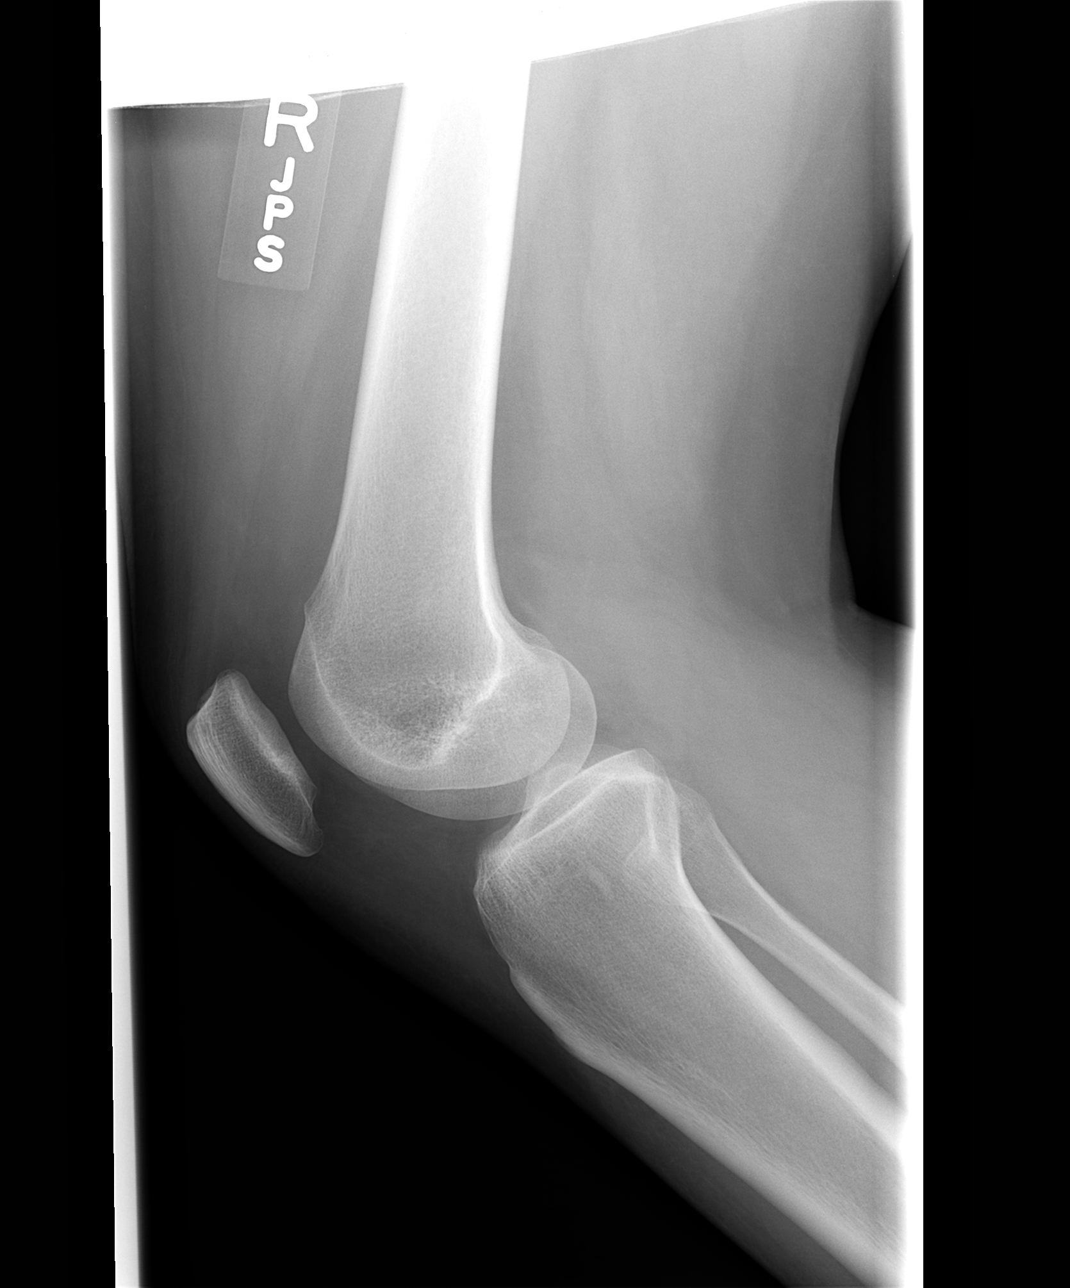

[4 of 4 positions shown; findings below may reference images not displayed]

FINDINGS: There is no evidence of fracture, dislocation, or joint effusion. 
There is no evidence of arthropathy or other focal bone abnormality.  Soft
tissues are unremarkable.
IMPRESSION: Negative.

## 2004-04-26 ENCOUNTER — Inpatient Hospital Stay (HOSPITAL_COMMUNITY): Admission: AD | Admit: 2004-04-26 | Discharge: 2004-04-26 | Payer: Self-pay | Admitting: Obstetrics

## 2004-05-04 ENCOUNTER — Ambulatory Visit (HOSPITAL_COMMUNITY): Admission: RE | Admit: 2004-05-04 | Discharge: 2004-05-04 | Payer: Self-pay | Admitting: Obstetrics

## 2004-05-04 IMAGING — US US OB COMP +14 WK
1 series · 13 of 23 positions shown · non-contrast
Comparison: none

CLINICAL DATA: 28-year-old.  G5 P4 with LMP of [DATE].  IUGR at 37 weeks 4 day assigned gestational age and LMP.

[Series 1: us ob comp +14 wk · 0.39mm/px · 13 of 23 slices shown]
[im 1/23]
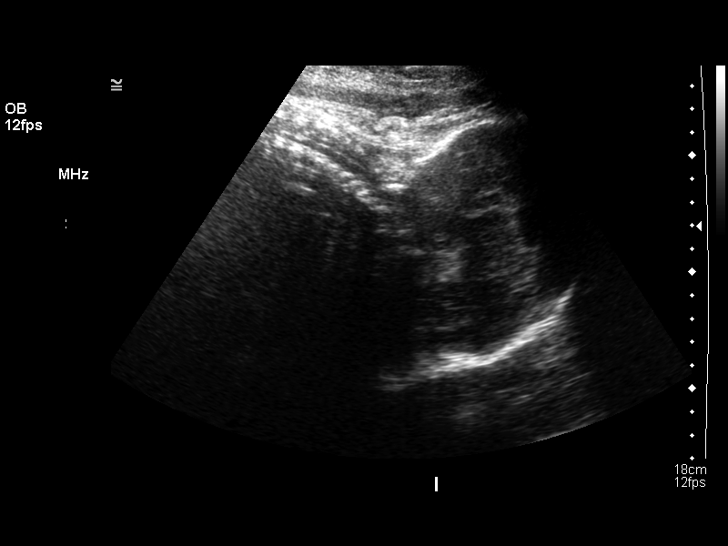
[im 3/23]
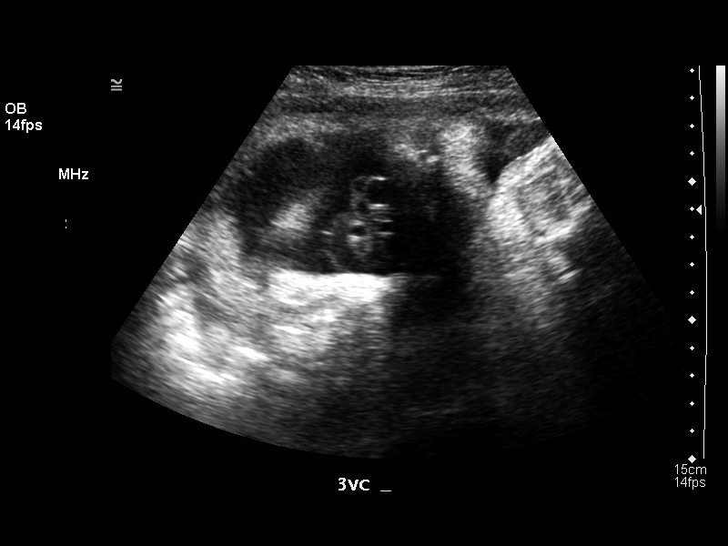
[im 5/23]
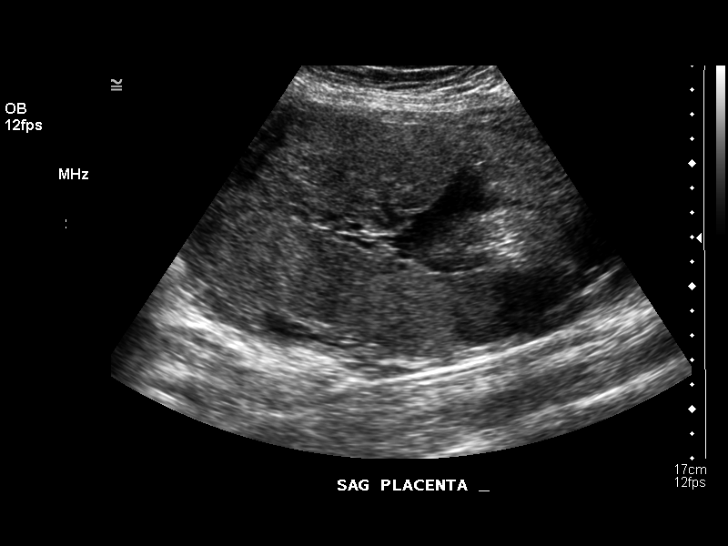
[im 7/23]
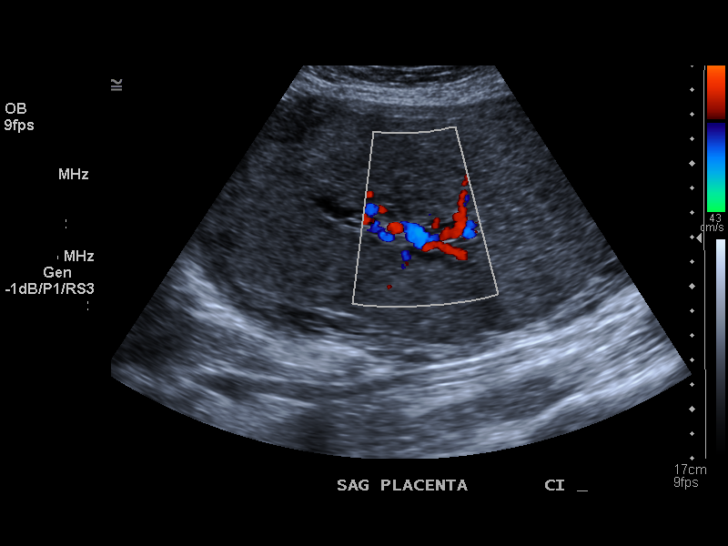
[im 8/23]
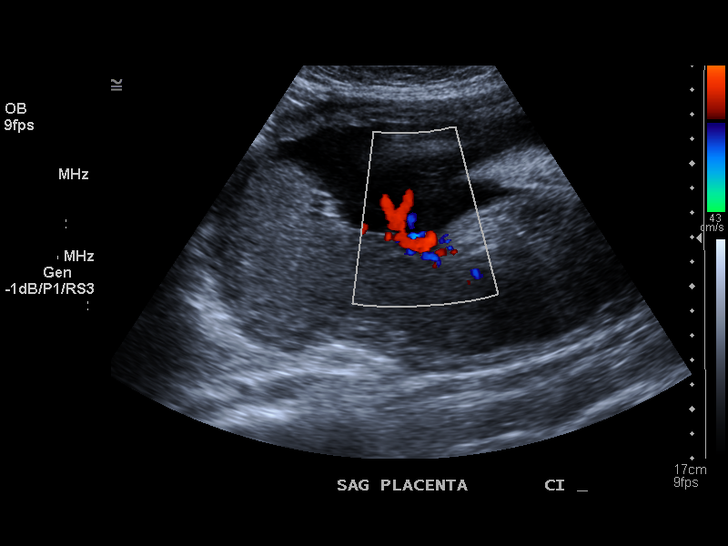
[im 10/23]
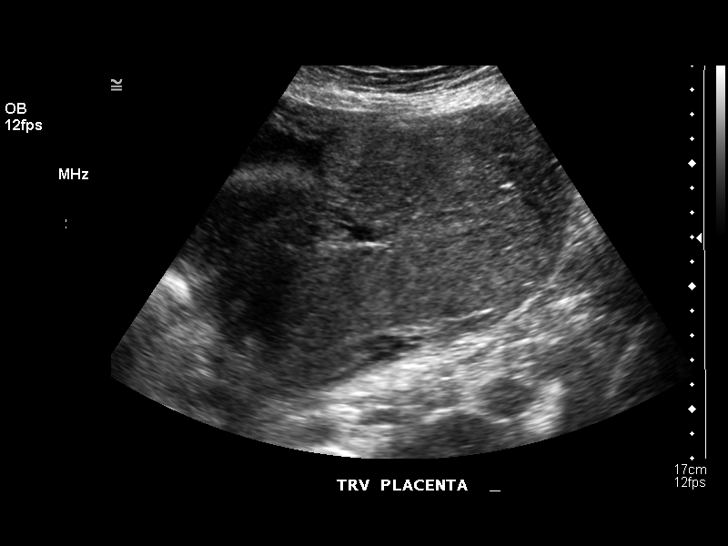
[im 12/23]
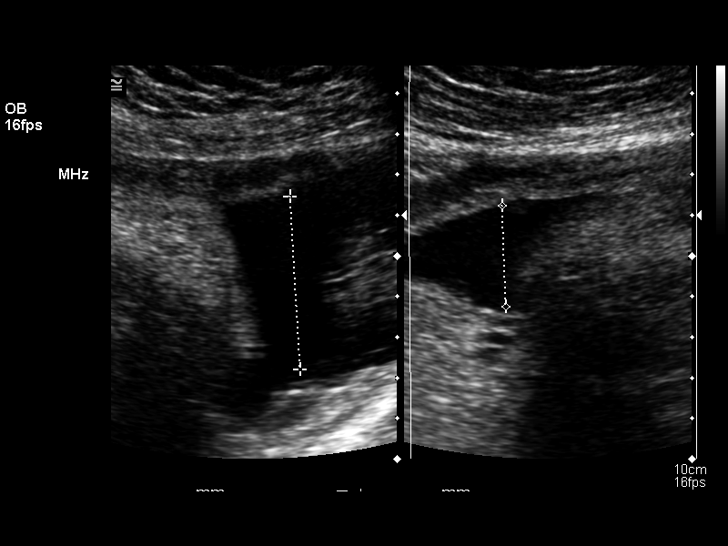
[im 14/23]
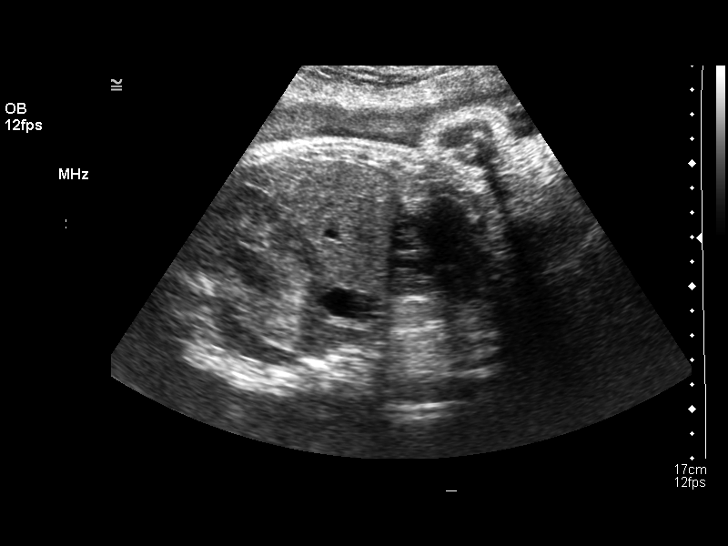
[im 16/23]
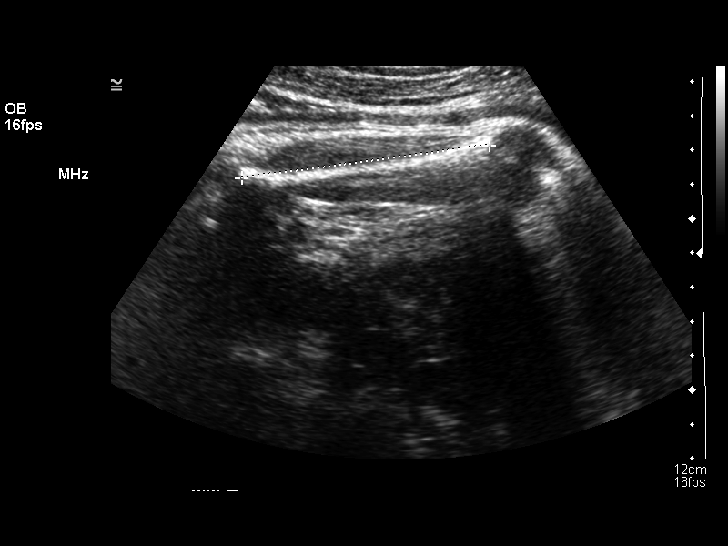
[im 17/23]
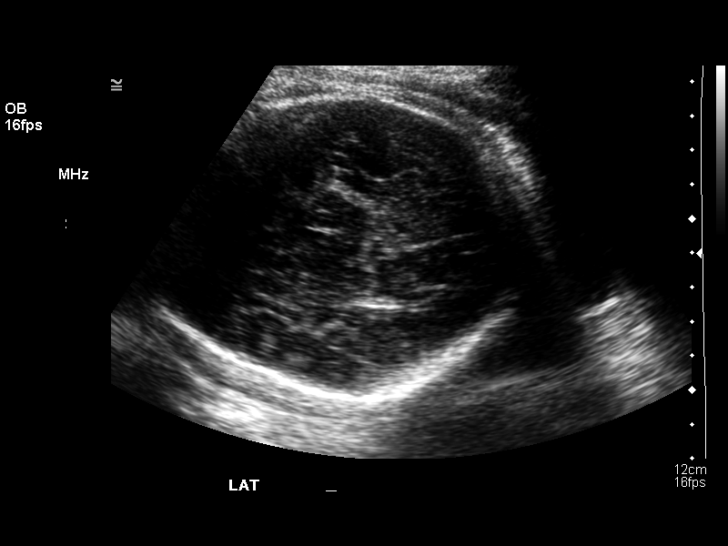
[im 19/23]
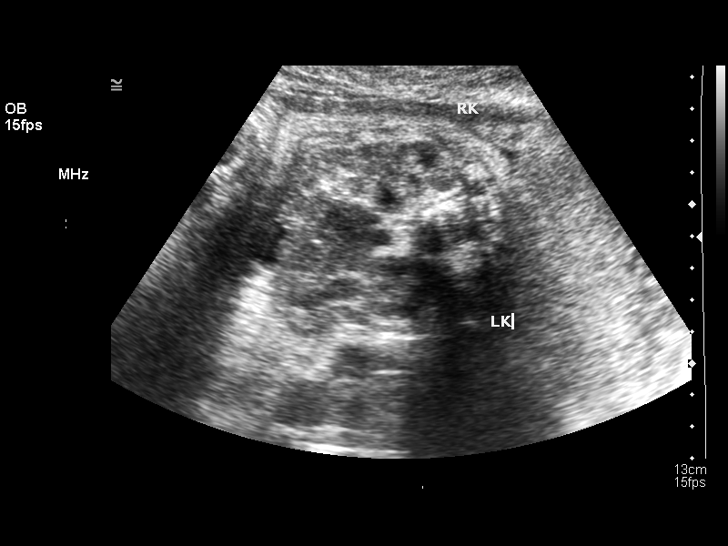
[im 21/23]
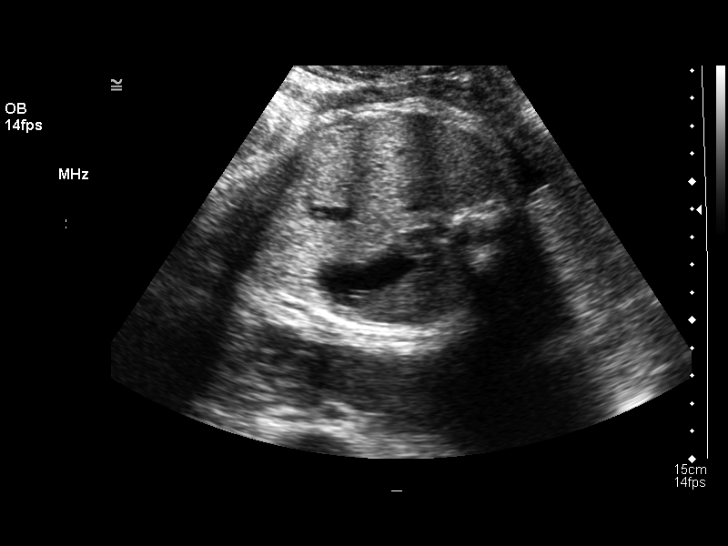
[im 23/23]
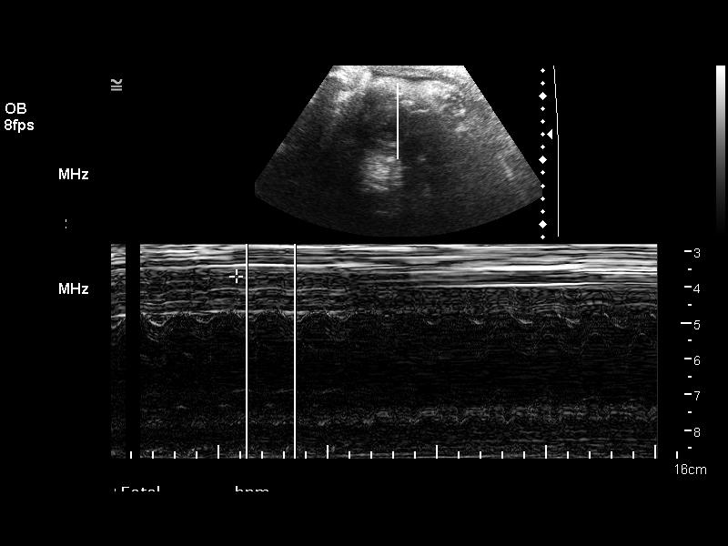

[13 of 23 positions shown; findings below may reference images not displayed]

OBSTETRICAL ULTRASOUND:
 Number of Fetuses:  1
 Heart Rate:  136
 Movement:  Yes
 Breathing:  Yes  
 Presentation:  Cephalic
 Placental Location:  Fundal, posterior
 Grade:  I
 Previa:  No
 Amniotic Fluid (Subjective):  Normal
 Amniotic Fluid (Objective):   14.1 cm AFI (5th -95th%ile = 9.2 ? 22.6 cm for 38 wks)

 FETAL BIOMETRY
 BPD:   8.8 cm   35 w 3 d
 HC:   32.0 cm  35 w 6 d
 AC:   33.0 cm   36 w 5 d
 FL:    7.2 cm   37 w 0 d

 MEAN GA:  36 w 2 d

 EFW:  [7E] g (H) 25th ? 50th%ile ([7E] ? [7E] g) For 38 wks

 FETAL ANATOMY
 Lateral Ventricles:    Visualized 
 Thalami/CSP:      Previously seen 
 Posterior Fossa:  Not visualized 
 Nuchal Region:    N/A
 Spine:      Not visualized 
 4 Chamber Heart on Left:      Not visualized 
 Stomach on Left:      Visualized 
 3 Vessel Cord:    Visualized 
 Cord Insertion site:    Not visualized 
 Kidneys:  Visualized 
 Bladder:  Visualized 
 Extremities:      Not visualized 

 MATERNAL UTERINE AND ADNEXAL FINDINGS
 Cervix:   Not evaluated.
IMPRESSION: Single living intrauterine fetus in cephalic presentation.   Amniotic fluid volume is within normal limits.  Estimated fetal weight is in the 25th ? 50th percentile for 38 wks.  

 </u12:p>

## 2004-05-09 ENCOUNTER — Inpatient Hospital Stay (HOSPITAL_COMMUNITY): Admission: AD | Admit: 2004-05-09 | Discharge: 2004-05-09 | Payer: Self-pay | Admitting: Obstetrics

## 2004-05-14 ENCOUNTER — Inpatient Hospital Stay (HOSPITAL_COMMUNITY): Admission: AD | Admit: 2004-05-14 | Discharge: 2004-05-14 | Payer: Self-pay | Admitting: Obstetrics

## 2004-05-18 ENCOUNTER — Inpatient Hospital Stay (HOSPITAL_COMMUNITY): Admission: AD | Admit: 2004-05-18 | Discharge: 2004-05-20 | Payer: Self-pay | Admitting: Obstetrics

## 2004-05-18 ENCOUNTER — Encounter (INDEPENDENT_AMBULATORY_CARE_PROVIDER_SITE_OTHER): Payer: Self-pay | Admitting: *Deleted

## 2004-07-08 ENCOUNTER — Emergency Department (HOSPITAL_COMMUNITY): Admission: EM | Admit: 2004-07-08 | Discharge: 2004-07-08 | Payer: Self-pay | Admitting: Family Medicine

## 2004-08-13 ENCOUNTER — Emergency Department (HOSPITAL_COMMUNITY): Admission: EM | Admit: 2004-08-13 | Discharge: 2004-08-13 | Payer: Self-pay | Admitting: Family Medicine

## 2004-09-16 ENCOUNTER — Emergency Department (HOSPITAL_COMMUNITY): Admission: EM | Admit: 2004-09-16 | Discharge: 2004-09-17 | Payer: Self-pay | Admitting: Emergency Medicine

## 2005-01-01 ENCOUNTER — Emergency Department (HOSPITAL_COMMUNITY): Admission: EM | Admit: 2005-01-01 | Discharge: 2005-01-01 | Payer: Self-pay | Admitting: Family Medicine

## 2005-06-30 ENCOUNTER — Emergency Department (HOSPITAL_COMMUNITY): Admission: EM | Admit: 2005-06-30 | Discharge: 2005-06-30 | Payer: Self-pay | Admitting: Emergency Medicine
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
## 2005-07-17 ENCOUNTER — Inpatient Hospital Stay (HOSPITAL_COMMUNITY): Admission: AD | Admit: 2005-07-17 | Discharge: 2005-07-17 | Payer: Self-pay | Admitting: Obstetrics

## 2005-07-17 IMAGING — US US PELVIS COMPLETE MODIFY
1 series · 14 of 25 positions shown · non-contrast
Comparison: None relevant.

CLINICAL DATA: Pelvic and back pain.
 TRANSABDOMINAL AND TRANSVAGINAL PELVIC ULTRASOUND:
TECHNIQUE: Both transabdominal and transvaginal ultrasound examinations of the pelvis were performed including evaluation of the uterus, ovaries, adnexal regions, and pelvic cul-de-sac.

[Series 1: us pelvis complete modify · 0.33mm/px · 14 of 54 slices shown]
[im 1/54]
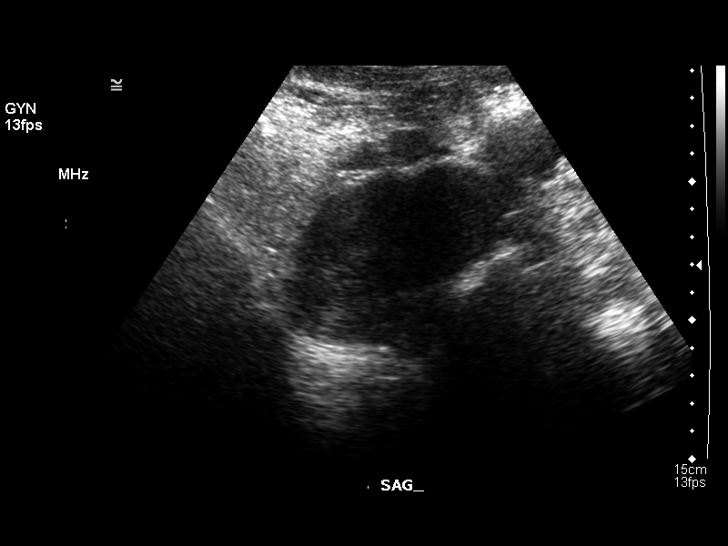
[im 5/54]
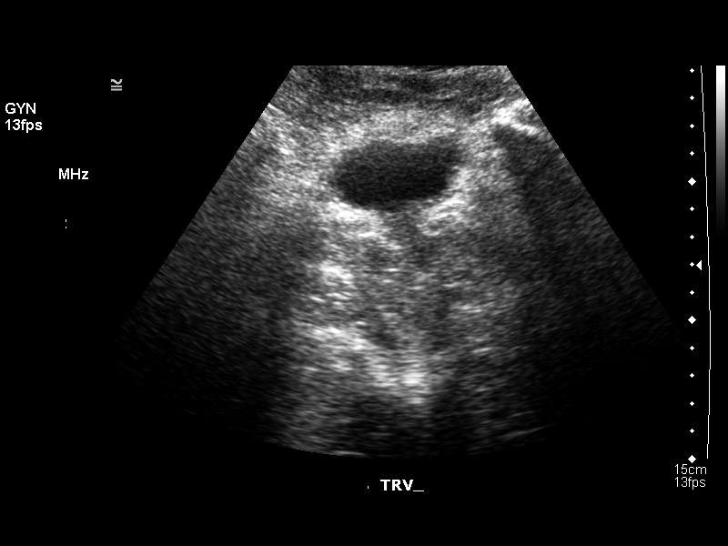
[im 9/54]
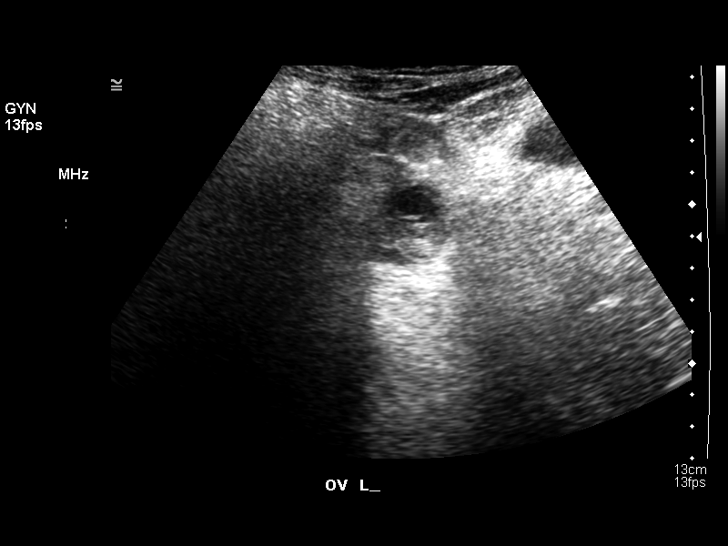
[im 14/54]
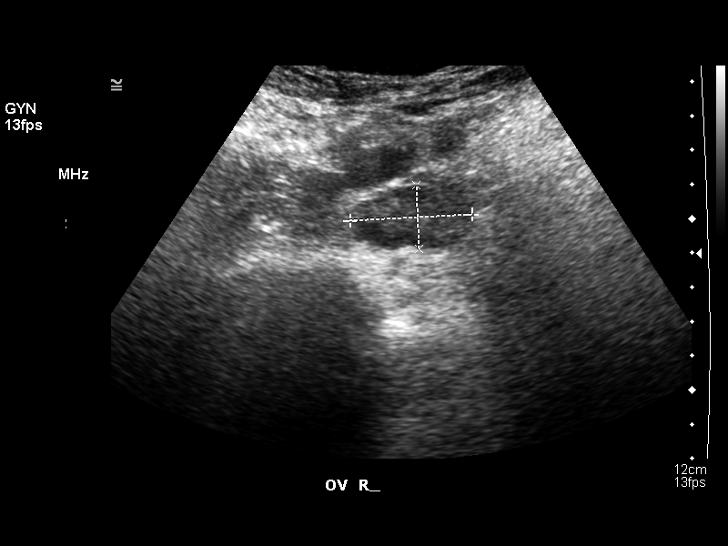
[im 18/54]
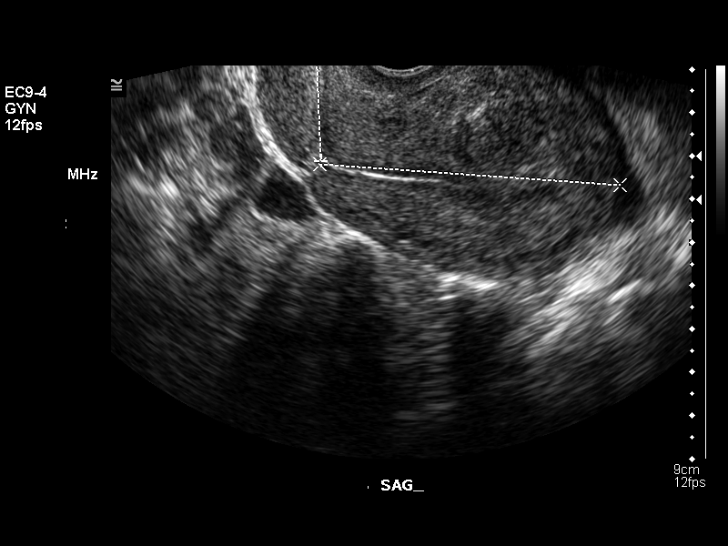
[im 20/54]
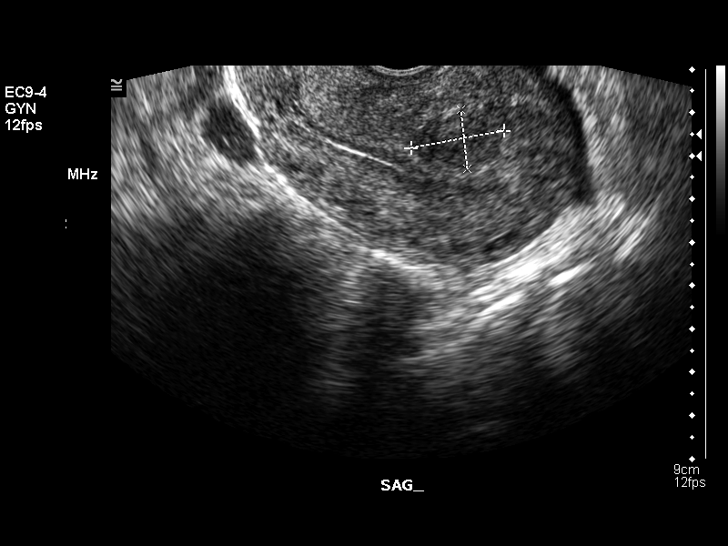
[im 25/54]
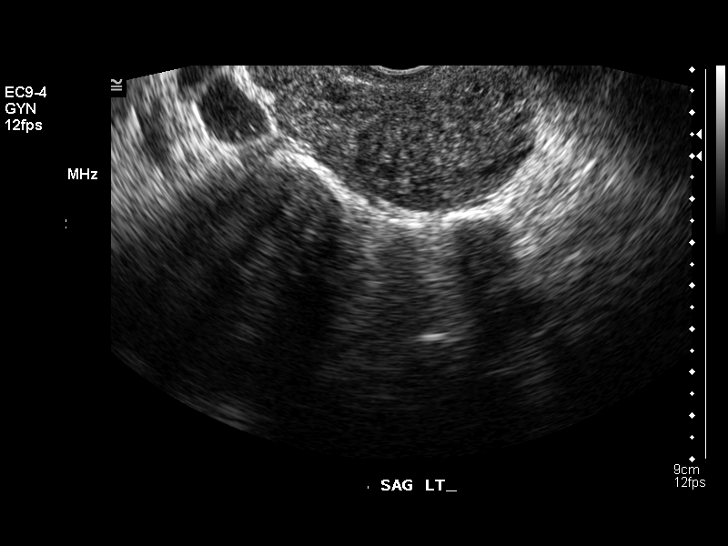
[im 29/54]
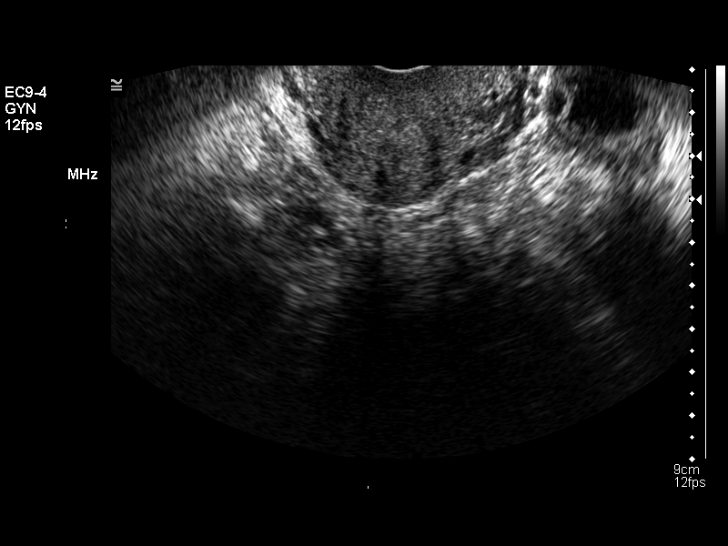
[im 34/54]
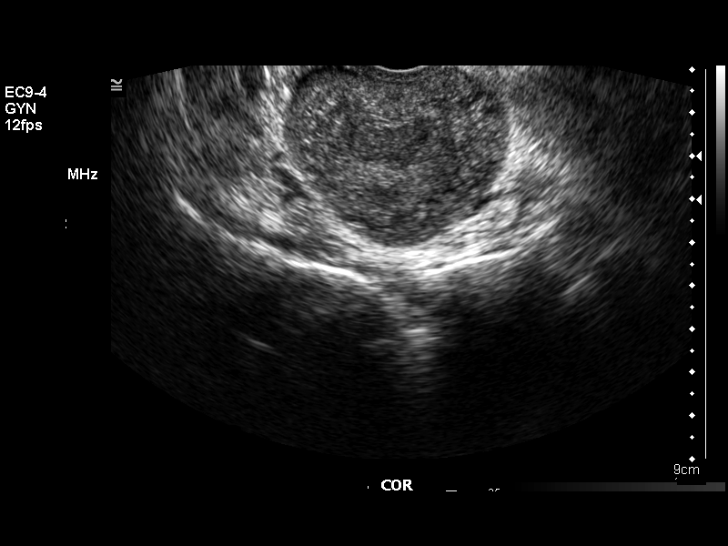
[im 36/54]
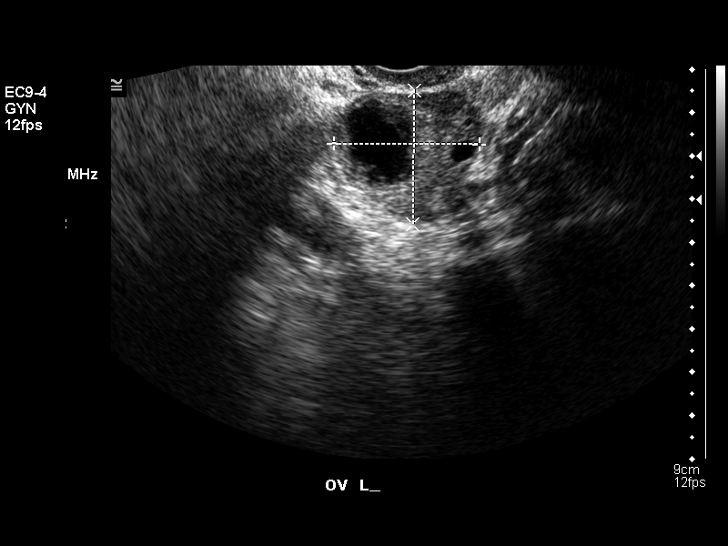
[im 40/54]
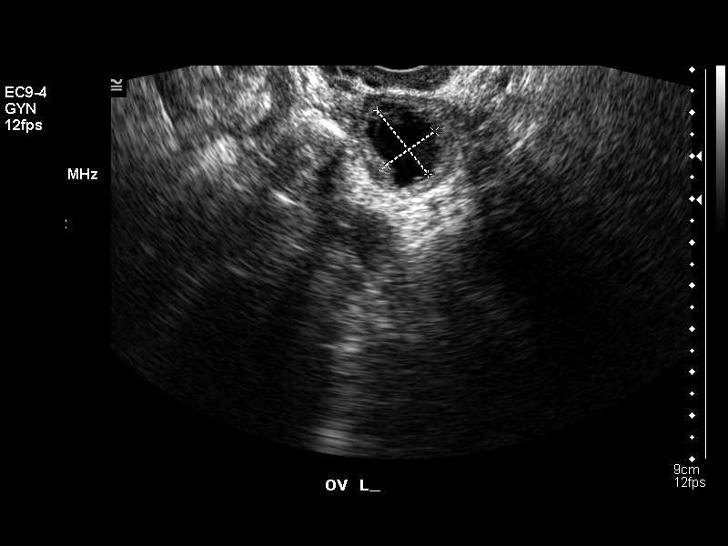
[im 45/54]
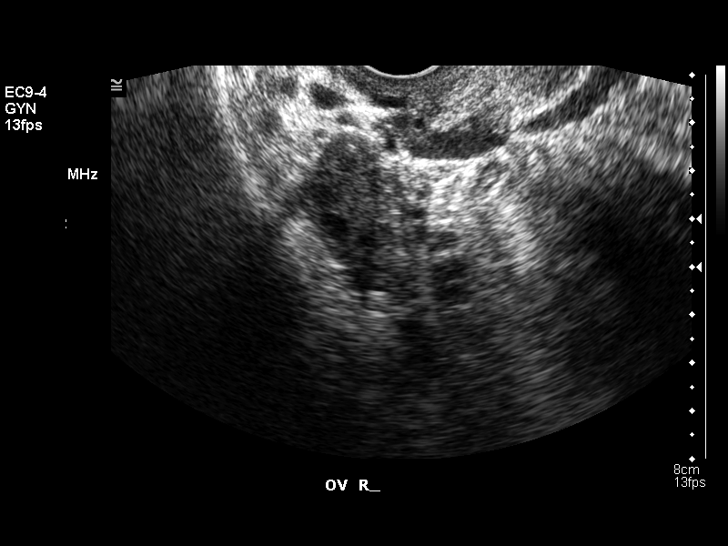
[im 49/54]
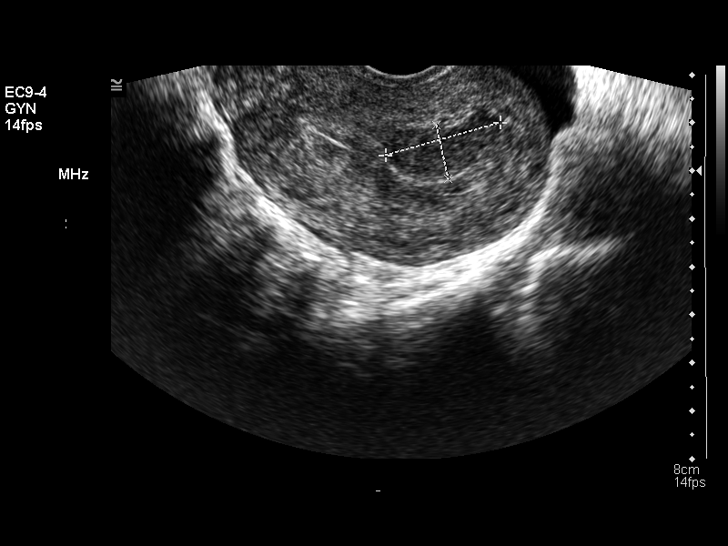
[im 54/54]
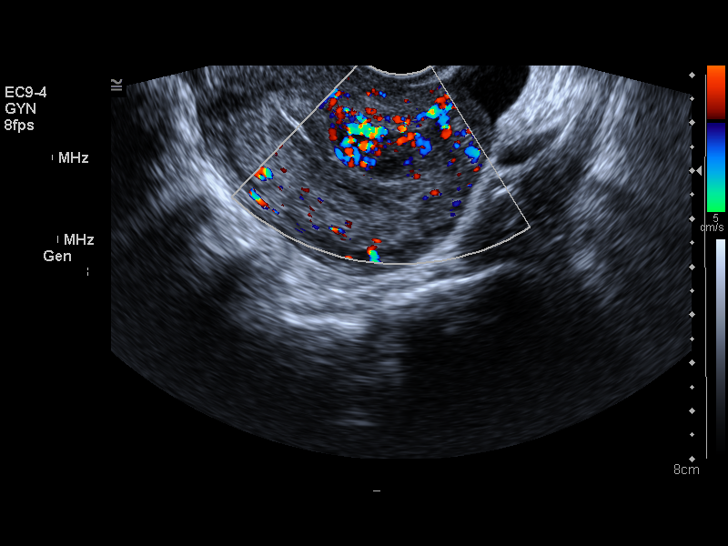

[14 of 25 positions shown; findings below may reference images not displayed]

FINDINGS: The uterus is retroverted, measuring approximately 9.5 x 4.8 x 5.7 cm.  An intramural fundal fibroid measures 2.4 x 1.1 cm and distorts the fundal endometrium.  There is no endometrial thickening.
 The right ovary appears normal, measuring 3.3 x 1.7 x 1.8 cm.  The left ovary measures 3.3 x 3.1 x 2.1 cm and demonstrates a small collapsing cyst measuring 1.8 cm in greatest dimension.  There is a small amount of free pelvic fluid.
IMPRESSION: 1.  Submucosal fundal fibroid with resulting endometrial distortion.
 2.  Collapsing cyst or follicle of the left ovary.  The right ovary appears unremarkable.

## 2005-07-31 ENCOUNTER — Emergency Department (HOSPITAL_COMMUNITY): Admission: EM | Admit: 2005-07-31 | Discharge: 2005-07-31 | Payer: Self-pay | Admitting: Emergency Medicine

## 2005-07-31 IMAGING — CR DG KNEE COMPLETE 4+V*L*
4 series · 4 of 4 positions shown · non-contrast
Comparison: None.

CLINICAL DATA: Popping sensation in the knee.
 LEFT KNEE - 4 VIEW:

[t knee ap left]
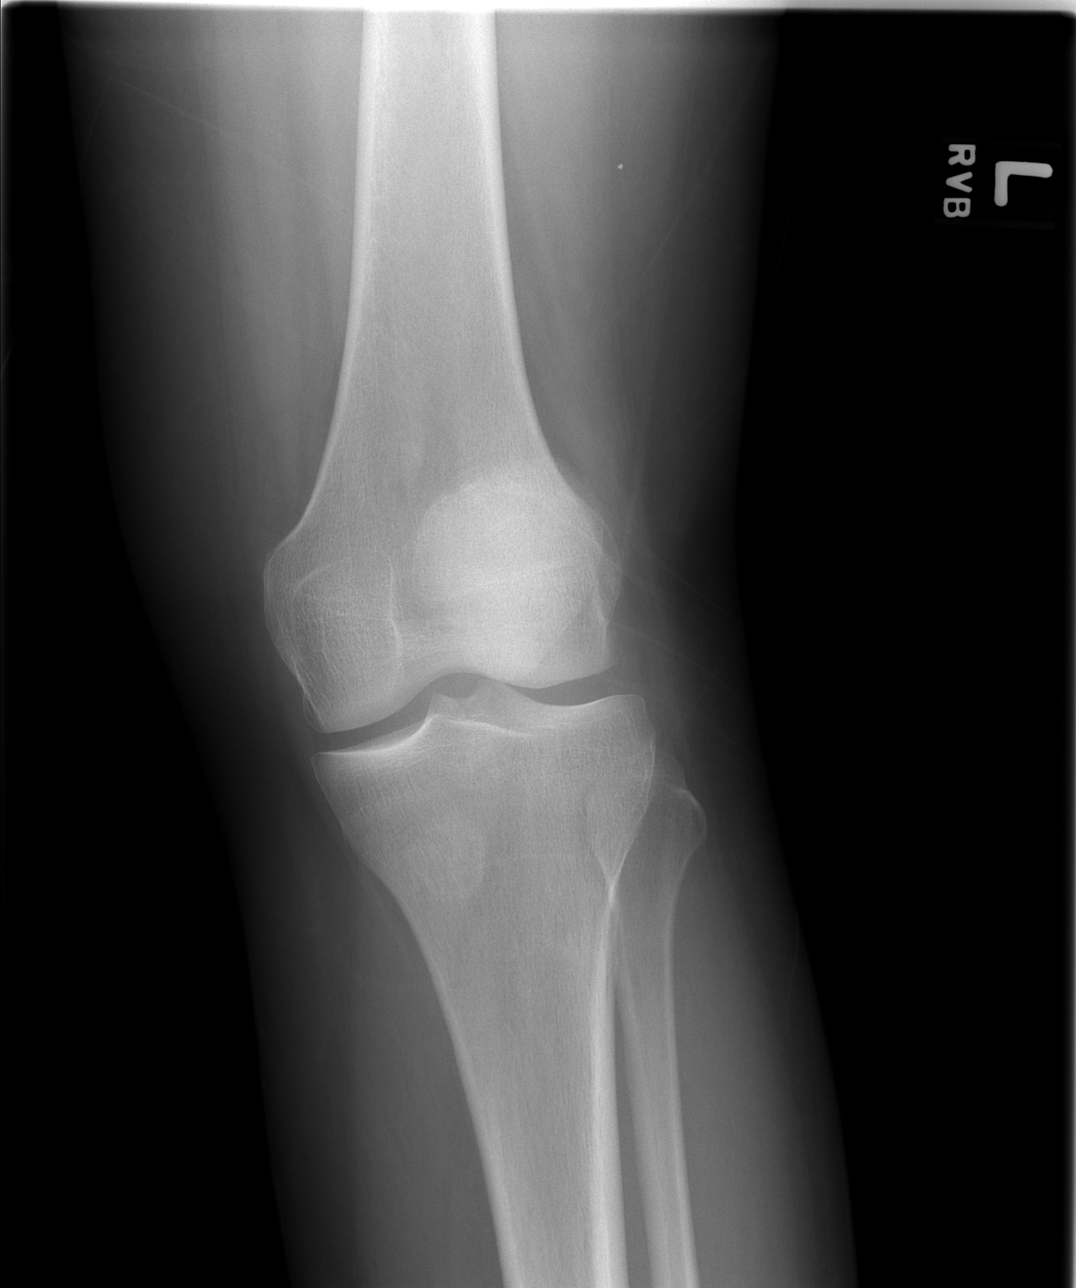

[t knee oblique left (1 of 2)]
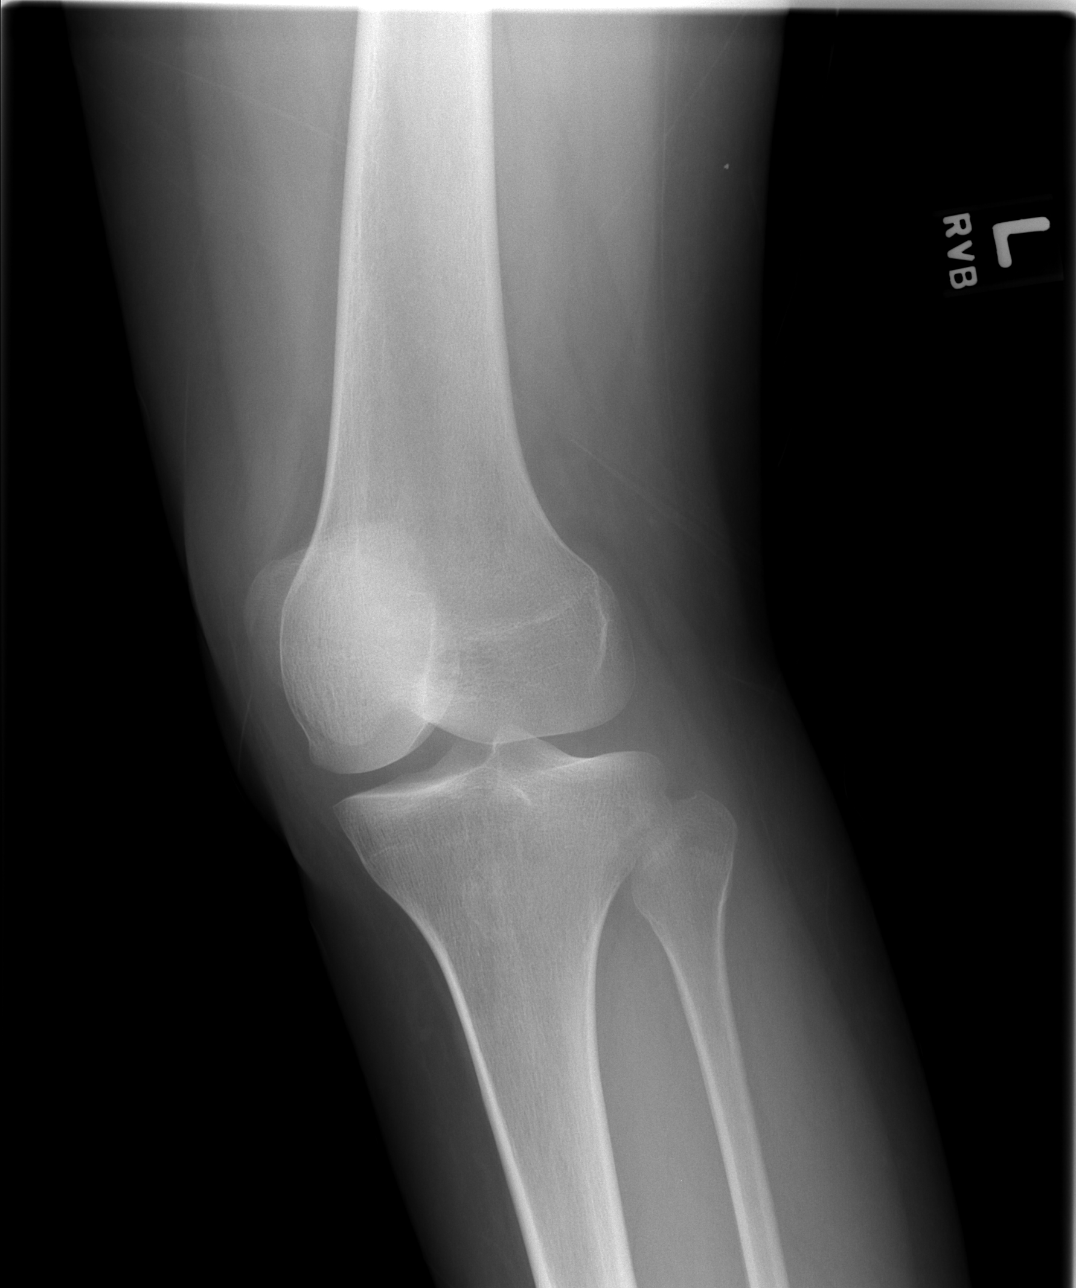

[t knee oblique left (2 of 2)]
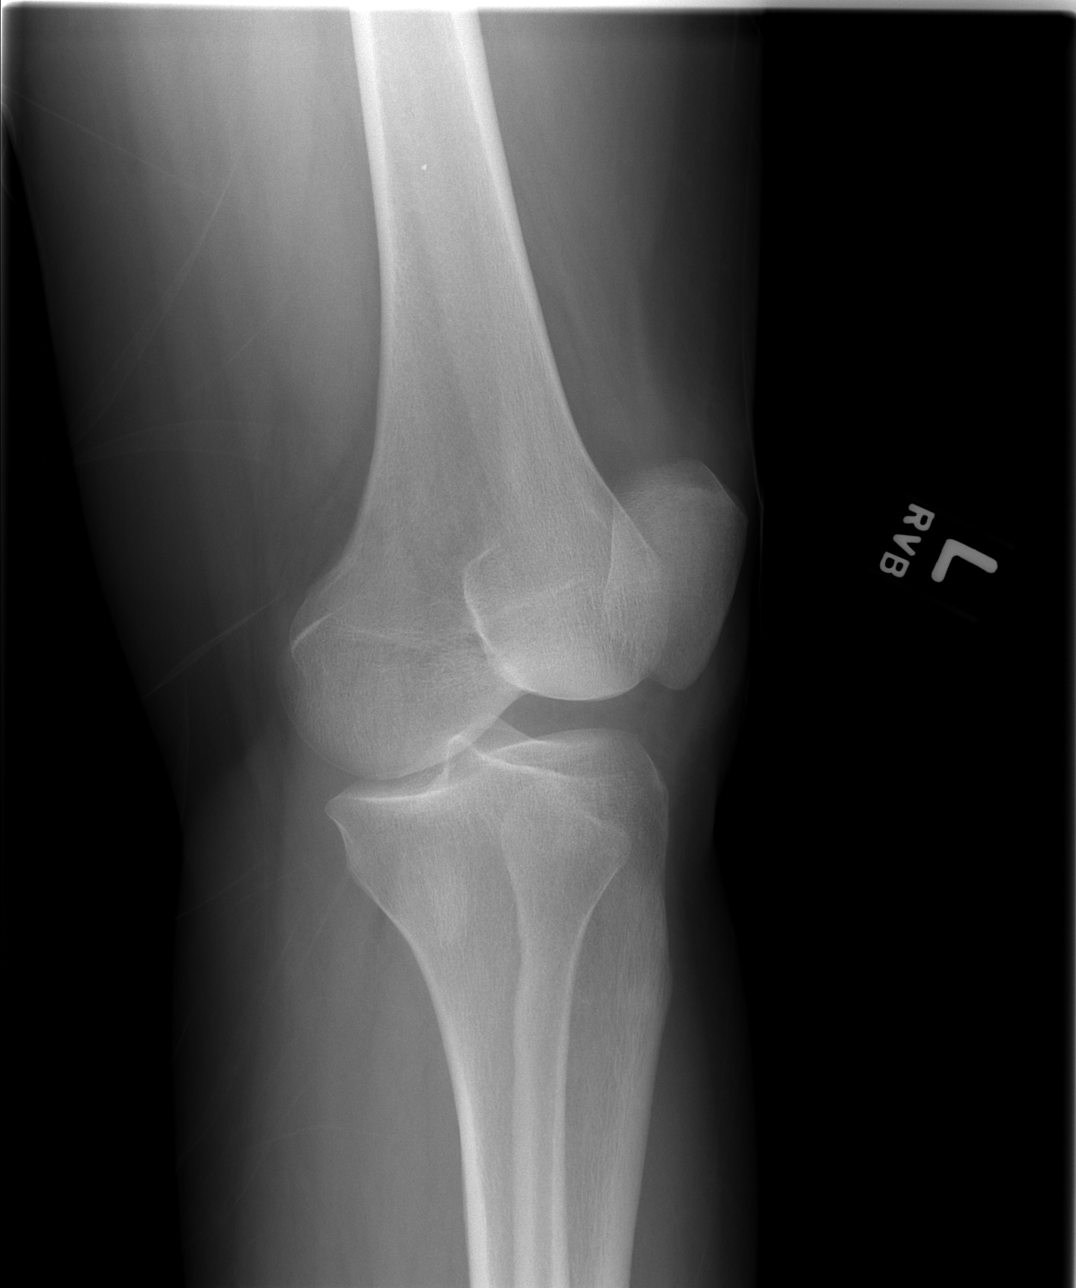

[t knee lat left]
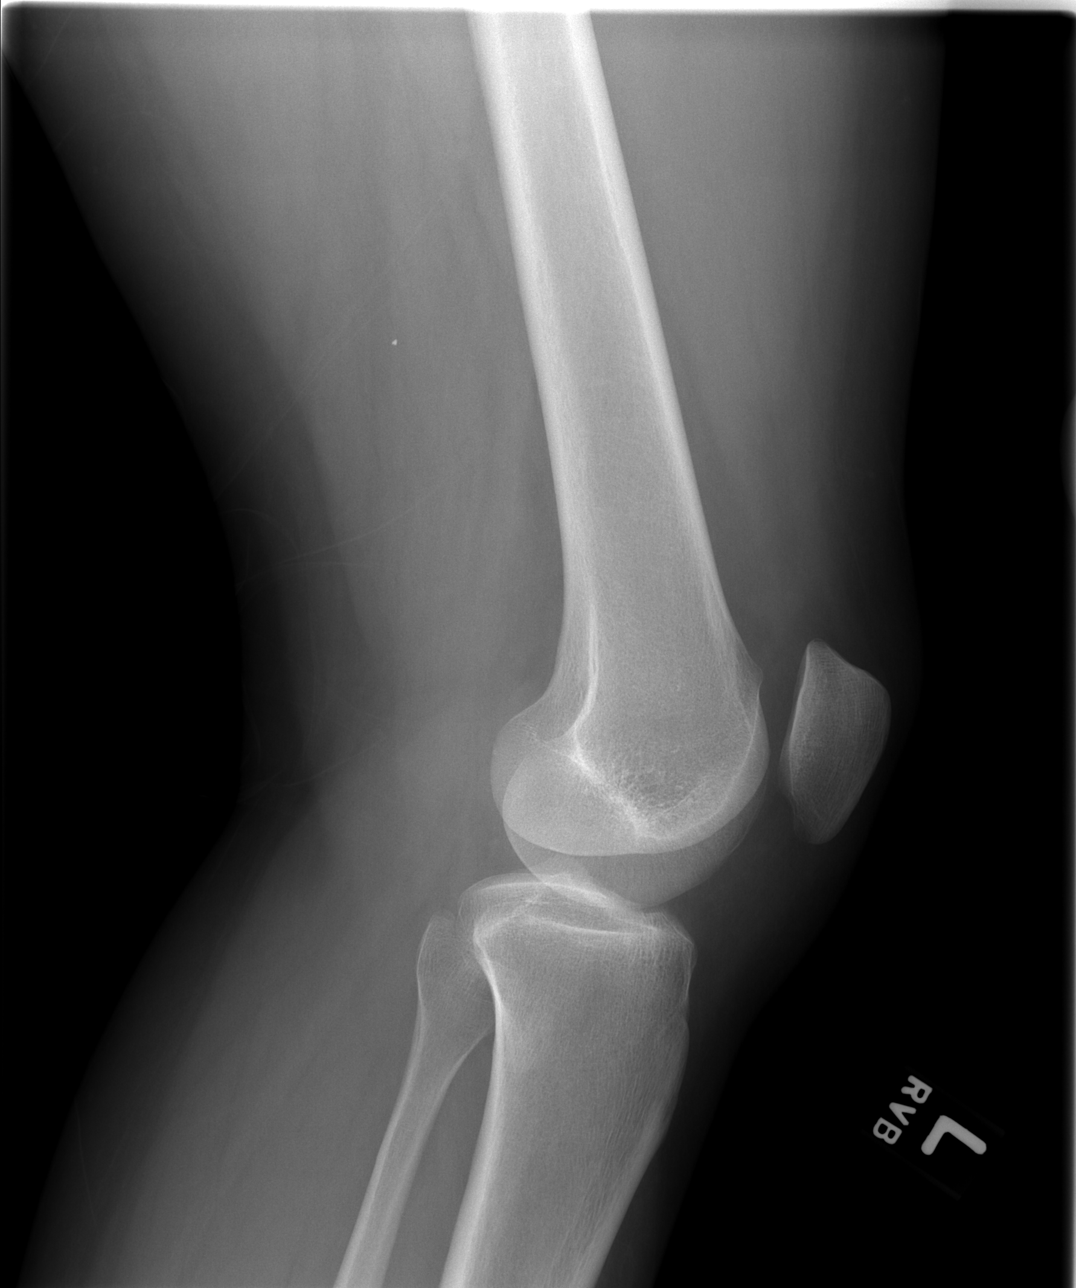

[4 of 4 positions shown; findings below may reference images not displayed]

FINDINGS: No definite knee effusion.  No visible fracture.  If pain persists despite conservative therapy, then MRI may be warranted.
IMPRESSION: No acute radiographic finding.

## 2005-10-16 ENCOUNTER — Emergency Department (HOSPITAL_COMMUNITY): Admission: EM | Admit: 2005-10-16 | Discharge: 2005-10-16 | Payer: Self-pay | Admitting: Emergency Medicine

## 2005-12-15 ENCOUNTER — Inpatient Hospital Stay (HOSPITAL_COMMUNITY): Admission: AD | Admit: 2005-12-15 | Discharge: 2005-12-15 | Payer: Self-pay | Admitting: Obstetrics

## 2006-07-22 ENCOUNTER — Emergency Department (HOSPITAL_COMMUNITY): Admission: EM | Admit: 2006-07-22 | Discharge: 2006-07-22 | Payer: Self-pay | Admitting: Family Medicine
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
## 2006-08-15 ENCOUNTER — Emergency Department (HOSPITAL_COMMUNITY): Admission: EM | Admit: 2006-08-15 | Discharge: 2006-08-15 | Payer: Self-pay | Admitting: Emergency Medicine

## 2006-10-08 ENCOUNTER — Emergency Department (HOSPITAL_COMMUNITY): Admission: EM | Admit: 2006-10-08 | Discharge: 2006-10-08 | Payer: Self-pay | Admitting: Family Medicine

## 2006-10-12 ENCOUNTER — Emergency Department (HOSPITAL_COMMUNITY): Admission: EM | Admit: 2006-10-12 | Discharge: 2006-10-12 | Payer: Self-pay | Admitting: Emergency Medicine

## 2006-11-18 ENCOUNTER — Inpatient Hospital Stay (HOSPITAL_COMMUNITY): Admission: AD | Admit: 2006-11-18 | Discharge: 2006-11-19 | Payer: Self-pay | Admitting: Obstetrics

## 2006-11-29 ENCOUNTER — Emergency Department (HOSPITAL_COMMUNITY): Admission: EM | Admit: 2006-11-29 | Discharge: 2006-11-30 | Payer: Self-pay | Admitting: Emergency Medicine

## 2006-11-30 IMAGING — CR DG CERVICAL SPINE COMPLETE 4+V
5 series · 5 of 5 positions shown · non-contrast
Comparison: none

CLINICAL DATA: Trauma. 
 CERVICAL SPINE - 5 VIEW:

[w c-spine lat]
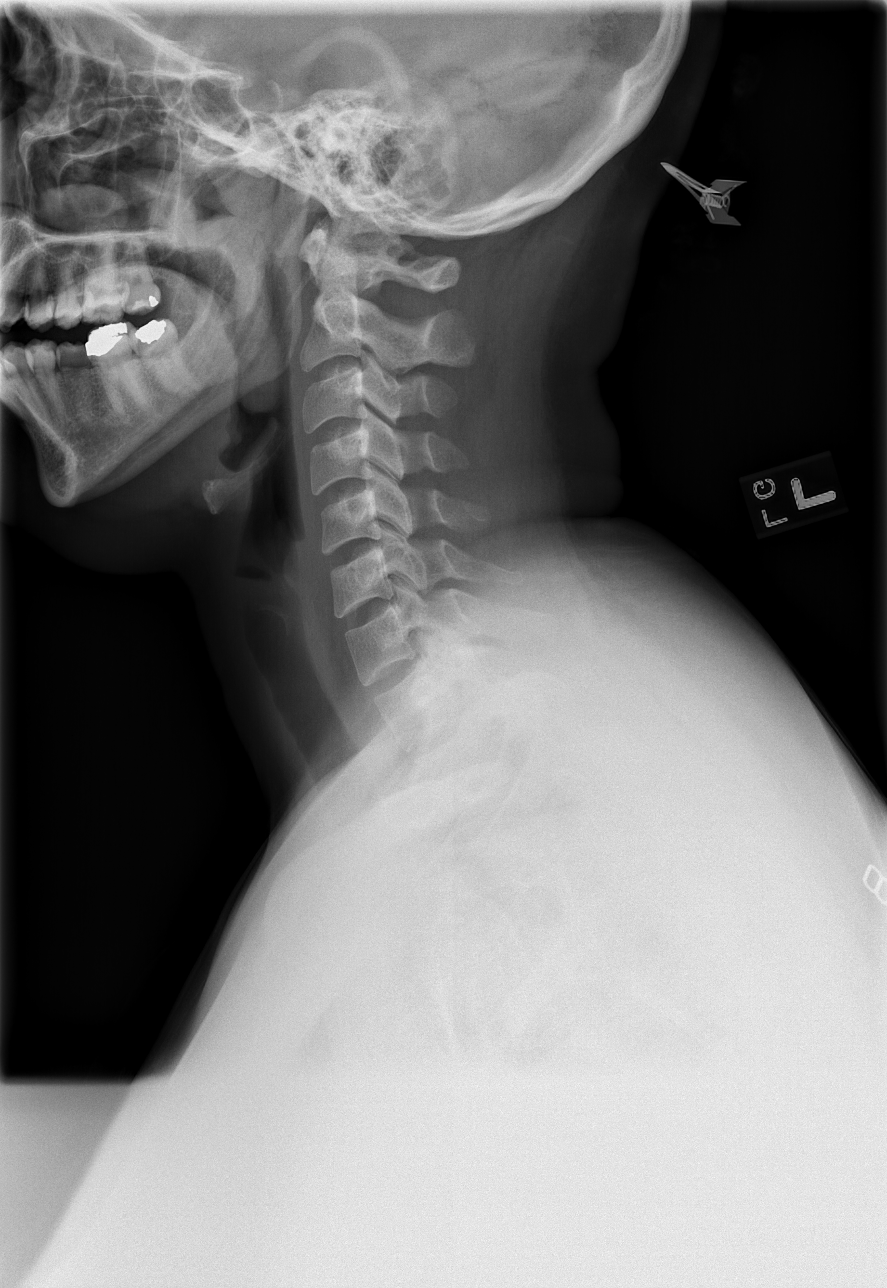

[w c-spine oblique (1 of 2)]
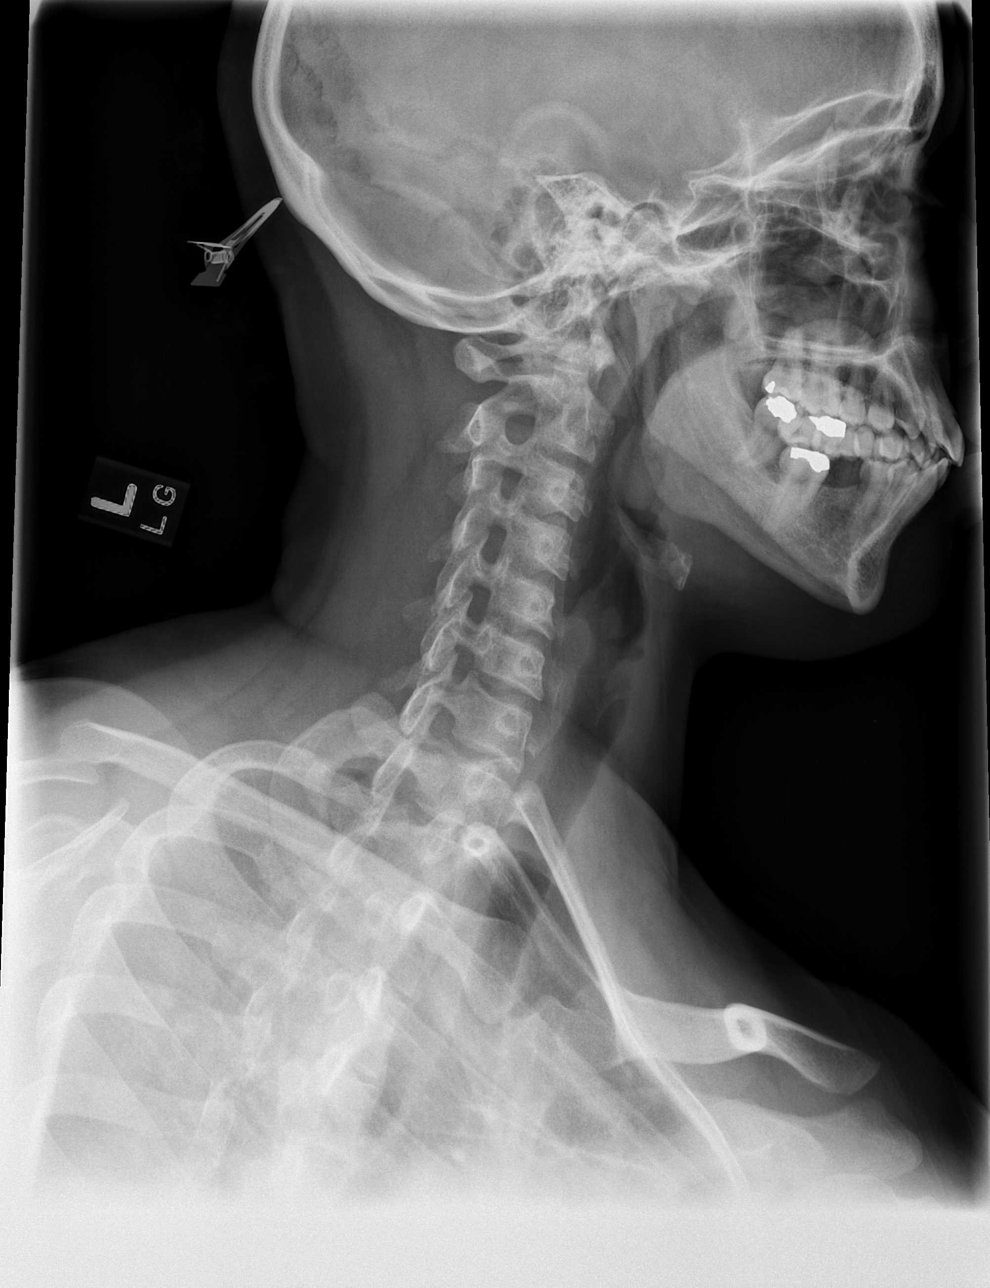

[w c-spine oblique (2 of 2)]
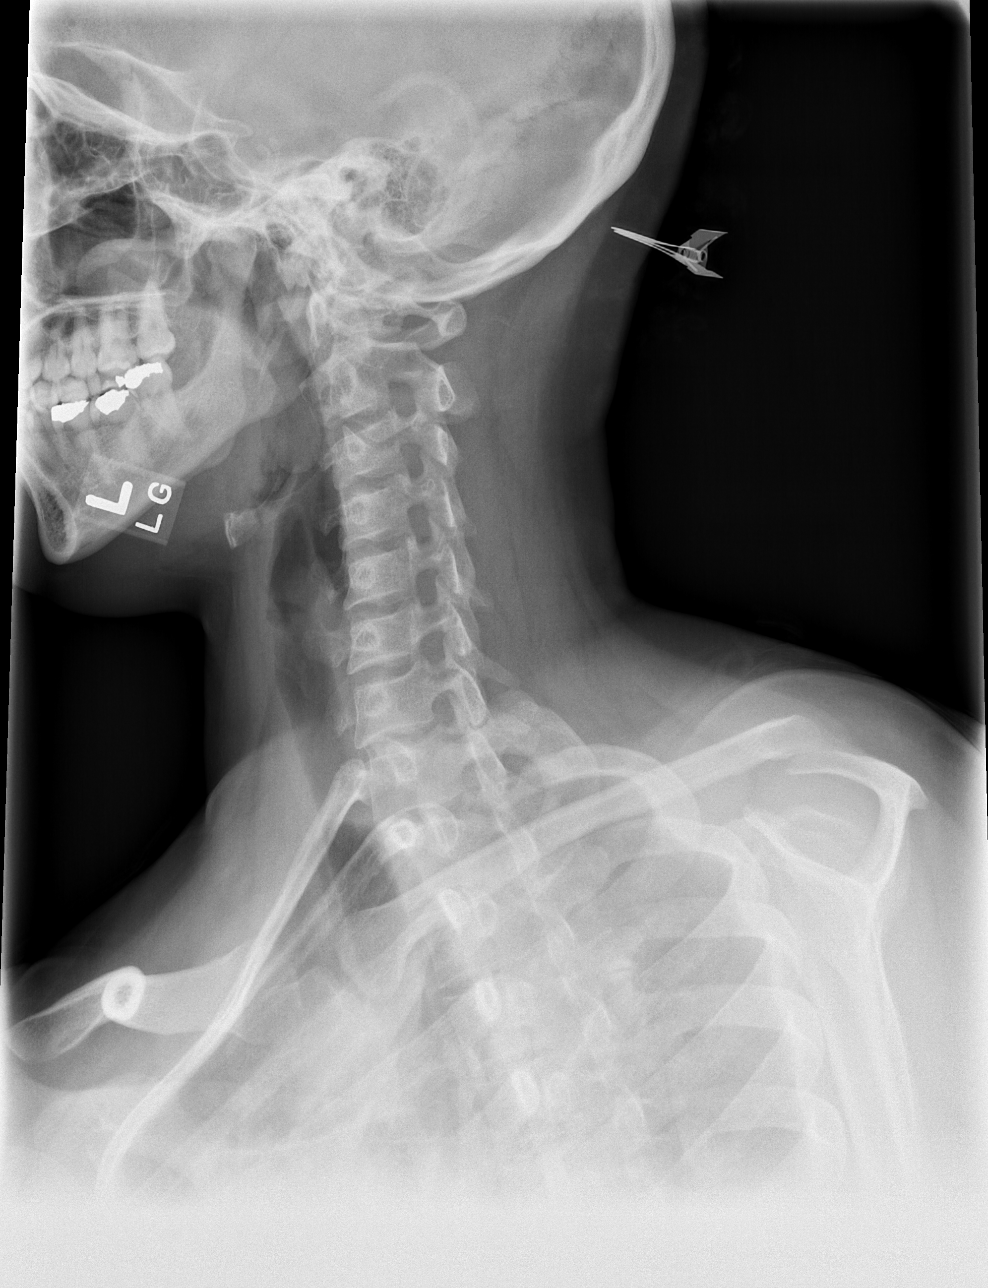

[t c-spine odontoid]
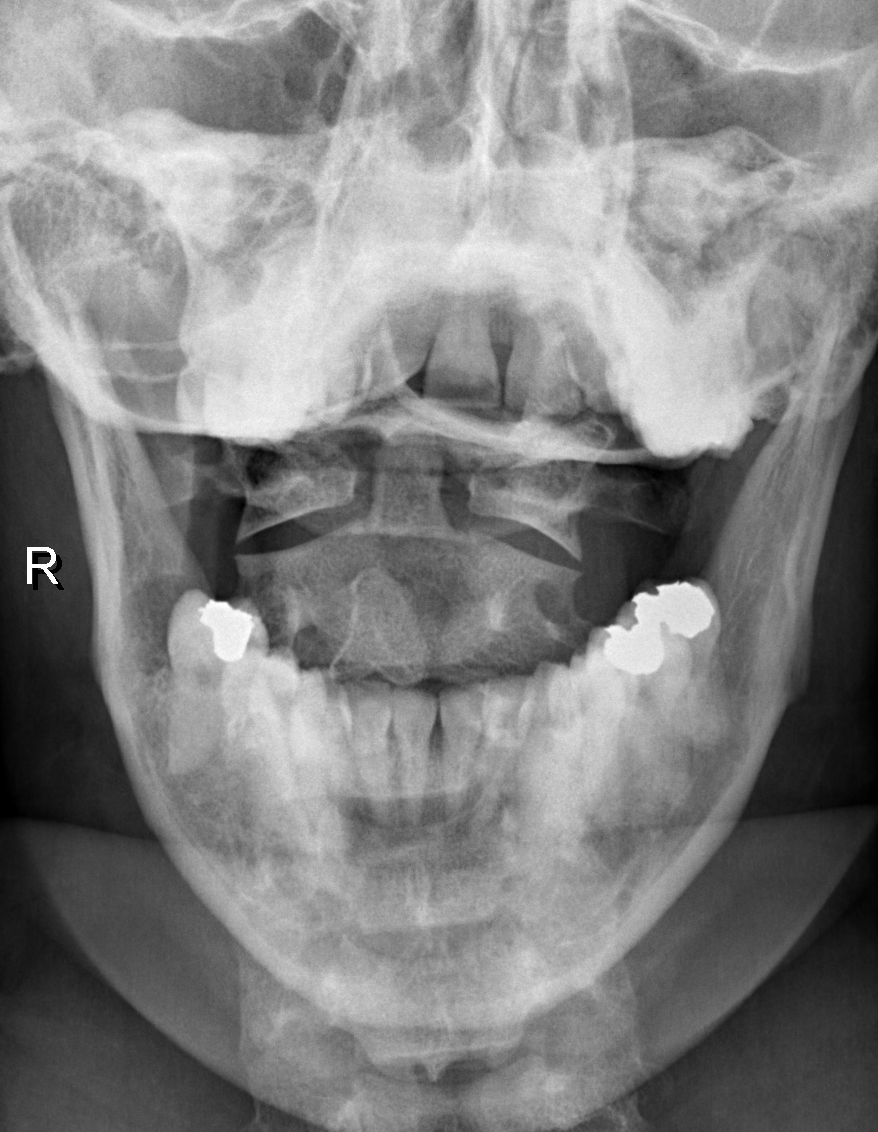

[t c-spine a.p.]
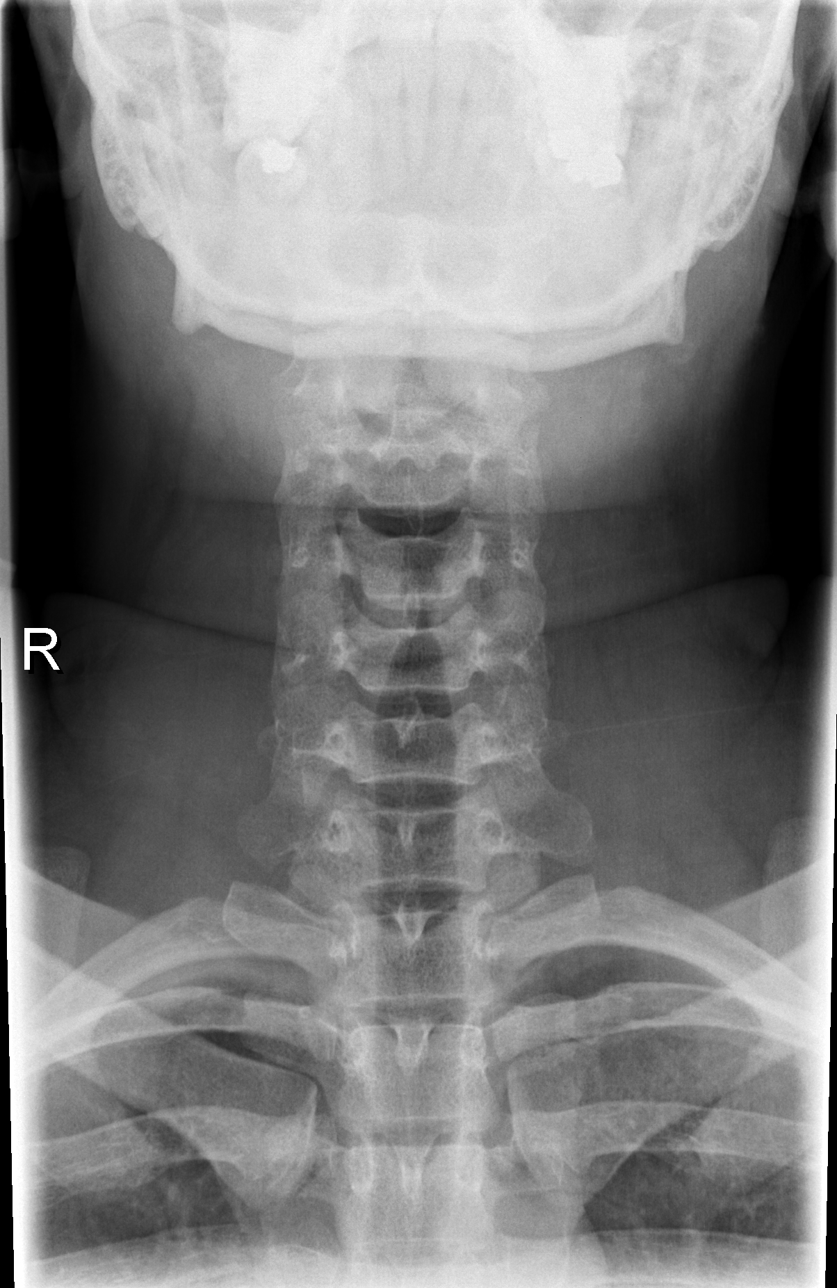

[5 of 5 positions shown; findings below may reference images not displayed]

FINDINGS: AP, lateral, oblique, and odontoid views of the cervical spine were obtained. Normal alignment without fracture or dislocation.  The prevertebral soft tissues are normal.
IMPRESSION: Negative cervical spine series.

## 2006-11-30 IMAGING — CR DG LUMBAR SPINE COMPLETE 4+V
6 series · 6 of 6 positions shown · non-contrast
Comparison: none

CLINICAL DATA: Trauma.  
 LUMBAR SPINE ? 4 VIEW:

[t l-spine a.p.]
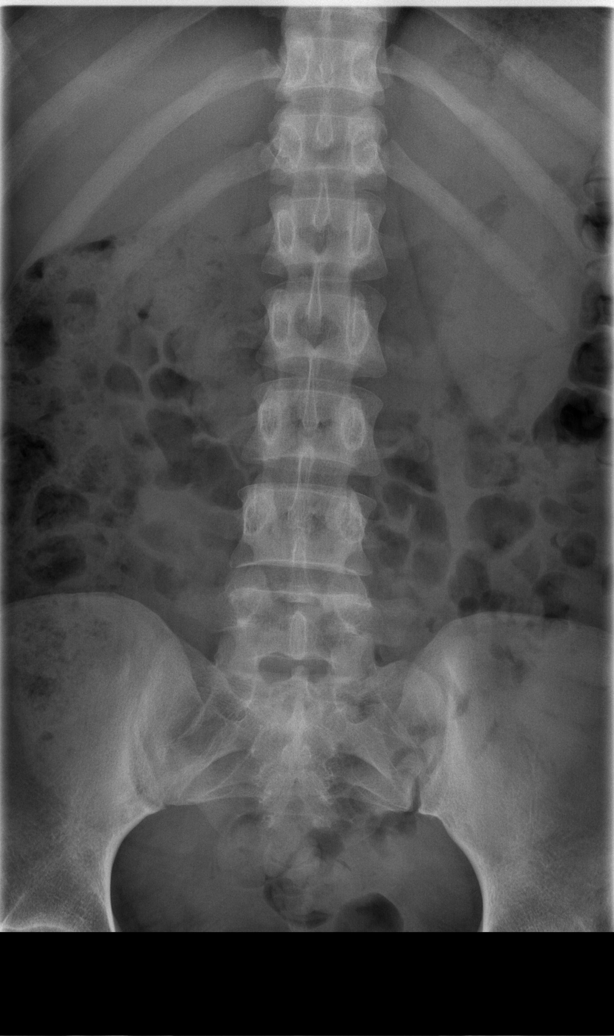

[t l-spine oblique exposure (1 of 2)]
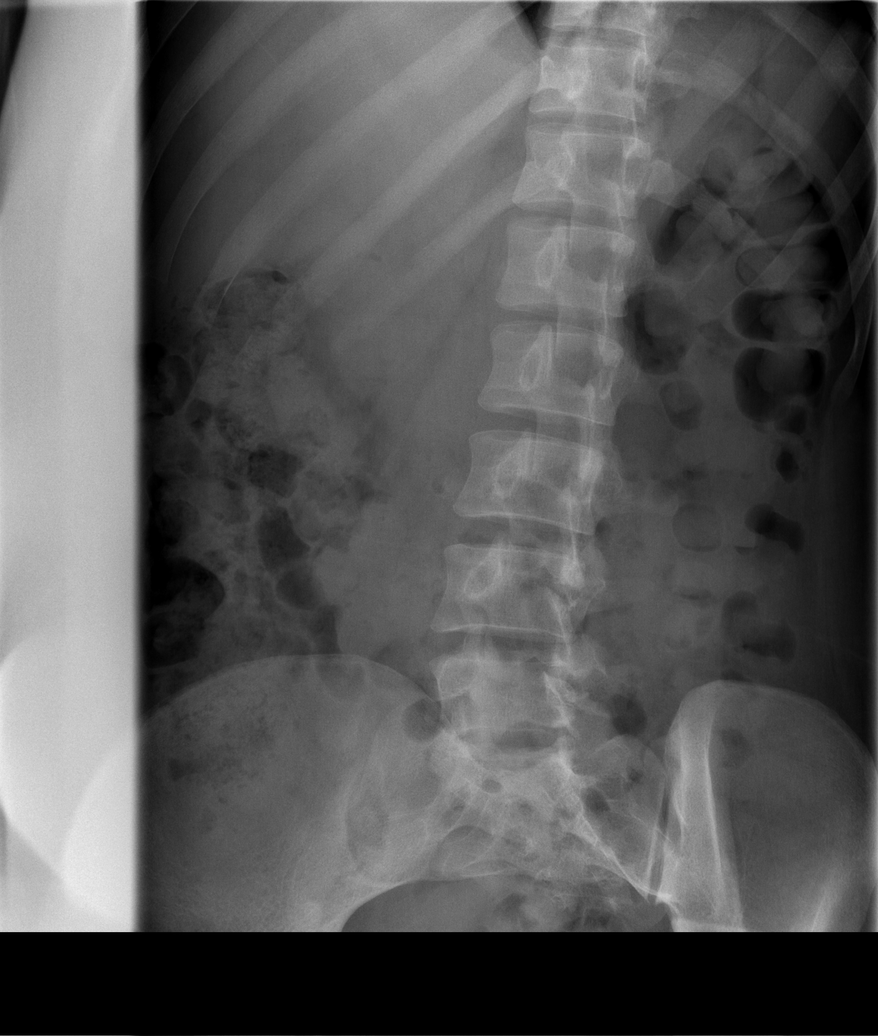

[t l-spine oblique exposure (2 of 2)]
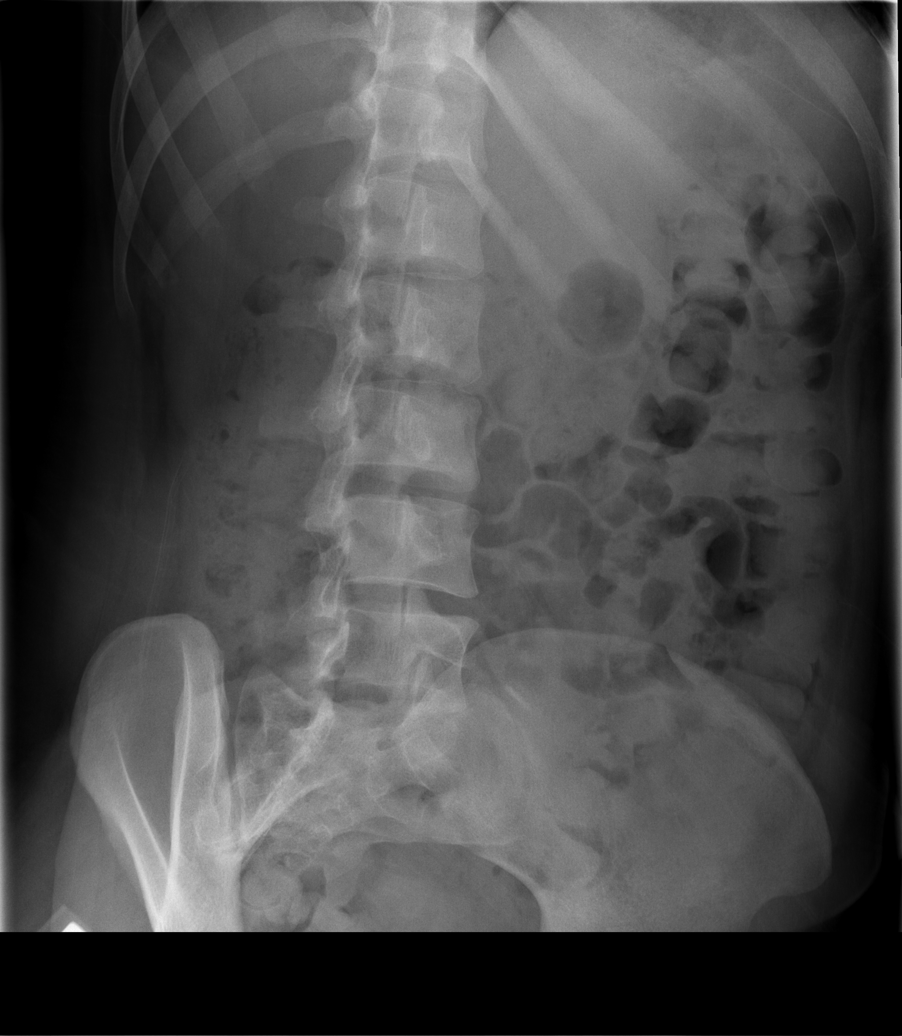

[t l-spine lat (1 of 2)]
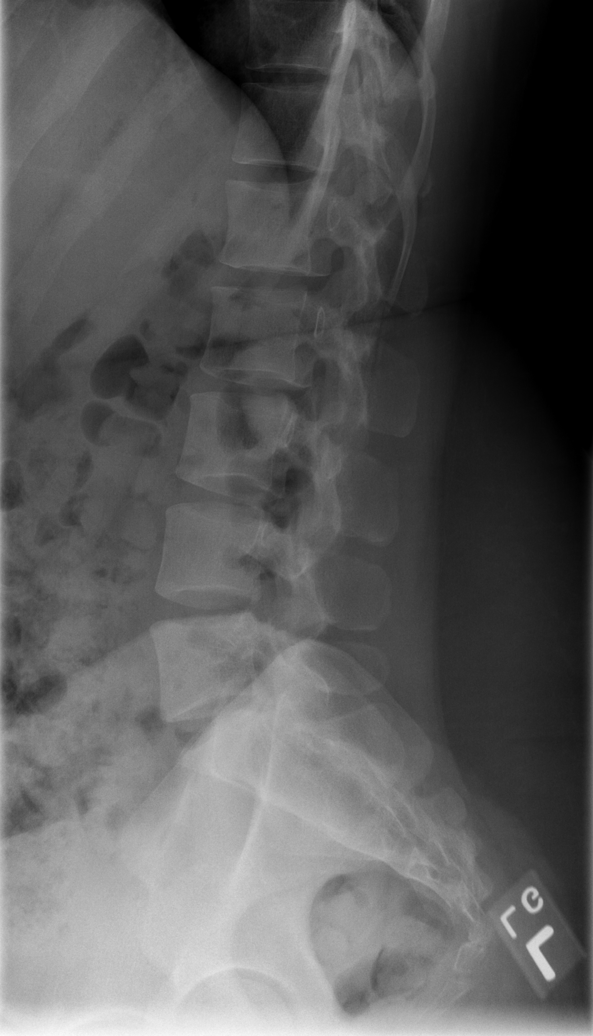

[t l-spine l5-s1 spot]
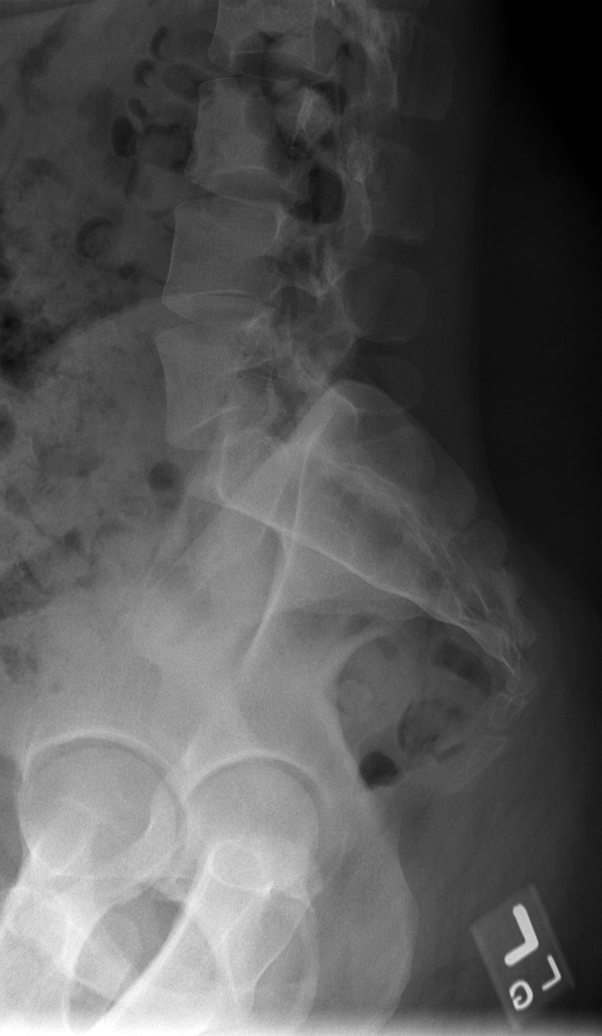

[t l-spine lat (2 of 2)]
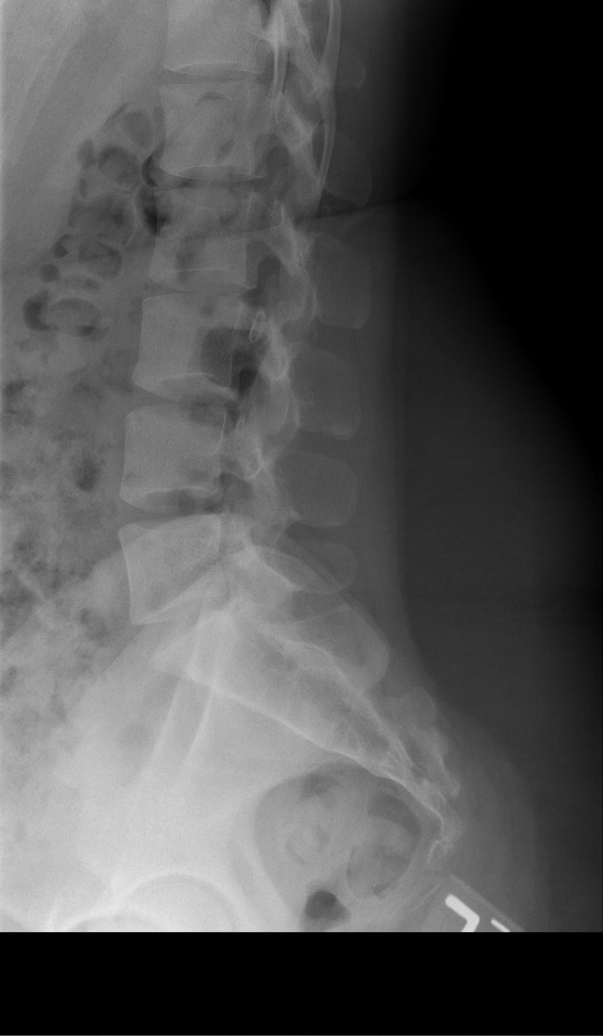

[6 of 6 positions shown; findings below may reference images not displayed]

FINDINGS: AP, lateral and oblique images of the lumbar spine were obtained.  Normal alignment without fracture or dislocation.  The vertebral body heights are maintained.
IMPRESSION: Negative radiographs of the lumbar spine.

## 2006-12-02 ENCOUNTER — Inpatient Hospital Stay (HOSPITAL_COMMUNITY): Admission: AD | Admit: 2006-12-02 | Discharge: 2006-12-02 | Payer: Self-pay | Admitting: Obstetrics

## 2006-12-02 IMAGING — US US OB TRANSVAGINAL MODIFY
1 series · 14 of 28 positions shown · non-contrast
Comparison: [DATE].

CLINICAL DATA: Low back pain following a fall. Four weeks and 3 days pregnant
by last menstrual period. No quantitative beta-hCG available at this time.

COMPLETE OBSTETRICAL ULTRASOUND LESS THAN 14 WEEKS AND TRANSVAGINAL OBSTETRICAL
ULTRASOUND

[Series 1: us ob comp less 14 wks · 14 of 40 slices shown]
[im 2/40]
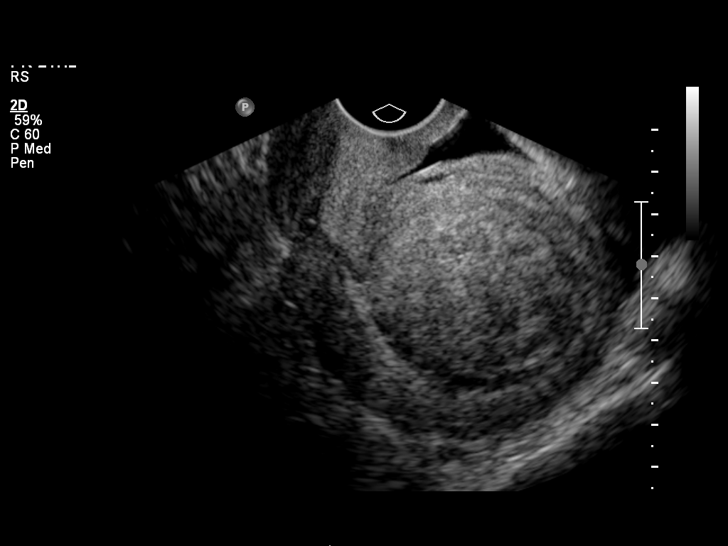
[im 5/40]
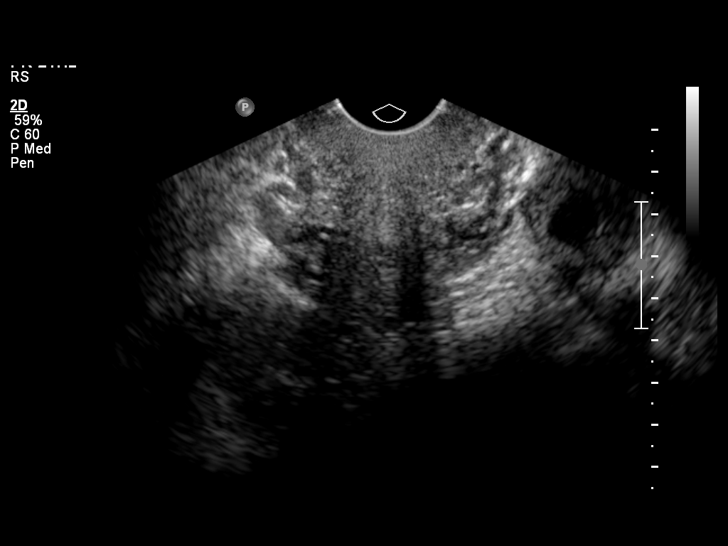
[im 8/40]
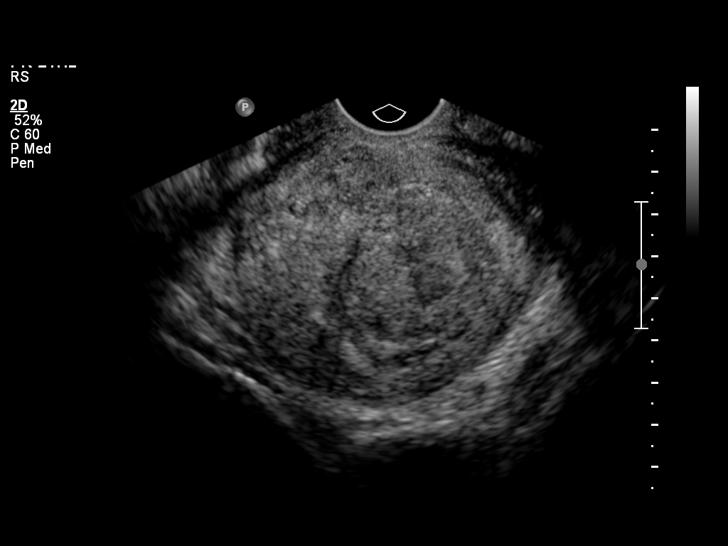
[im 11/40]
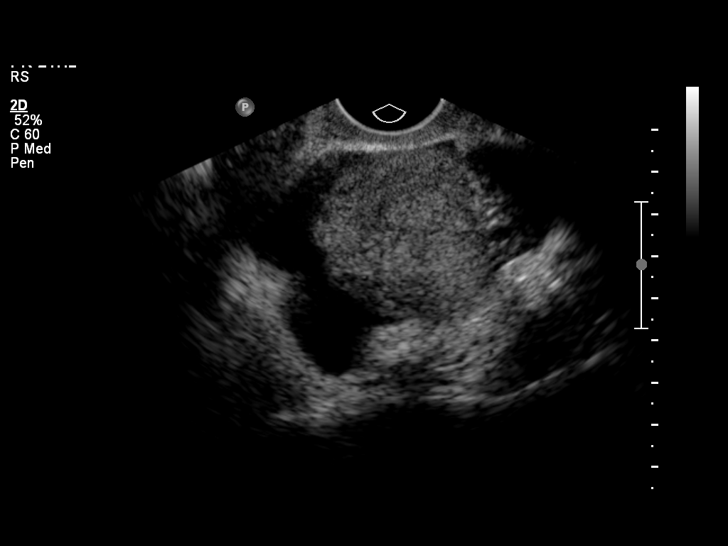
[im 14/40]
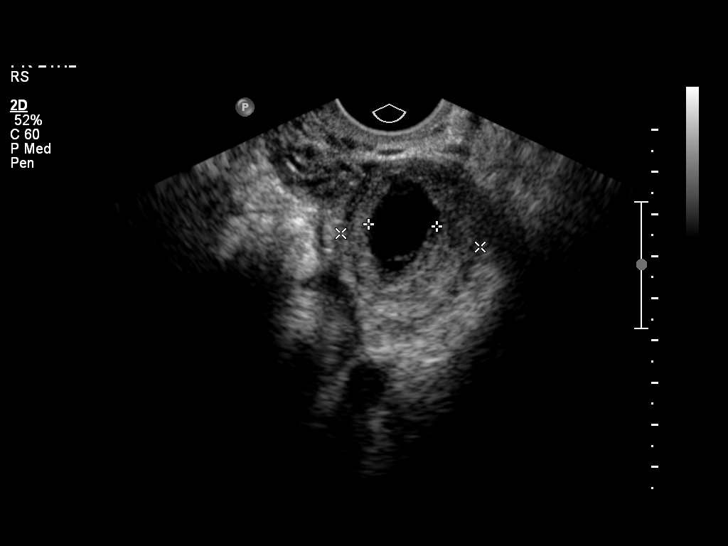
[im 16/40]
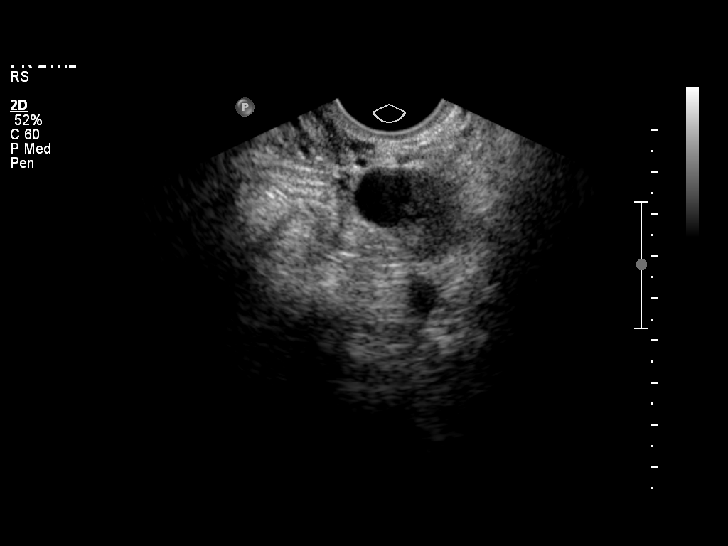
[im 19/40]
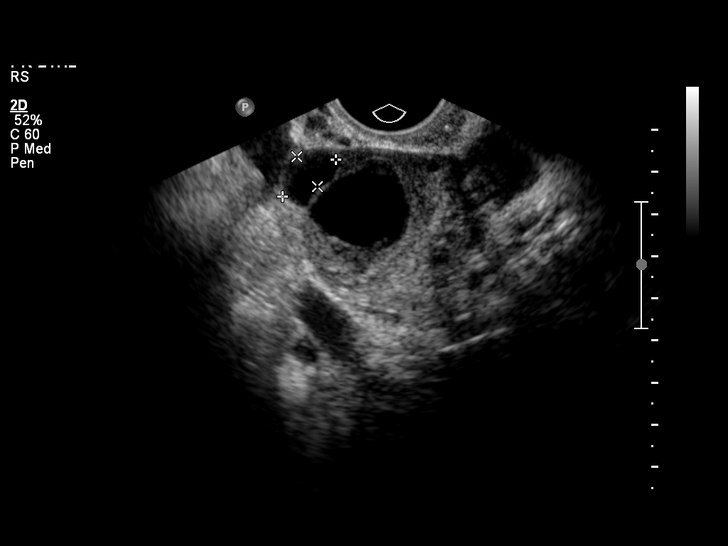
[im 22/40]
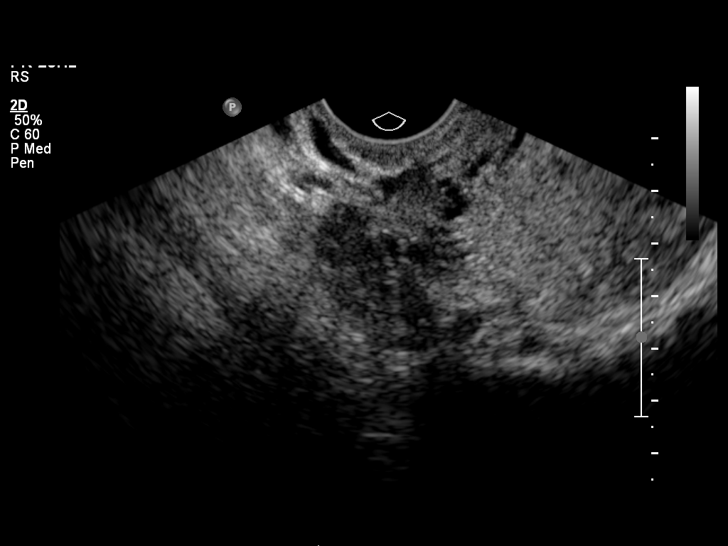
[im 25/40]
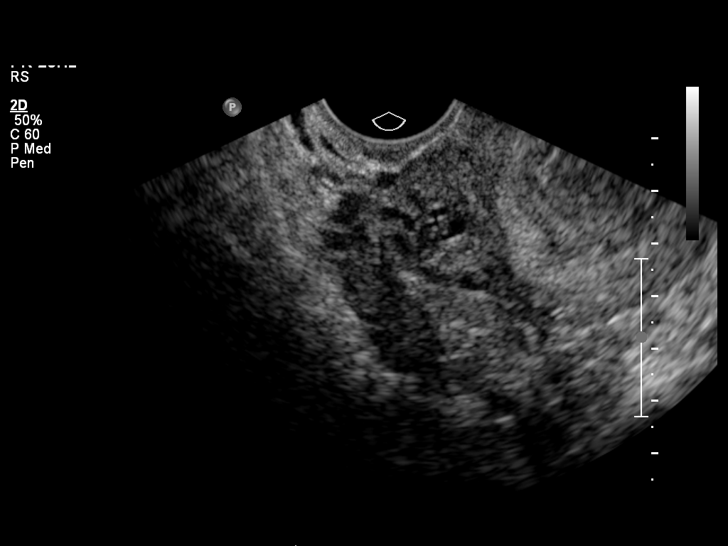
[im 28/40]
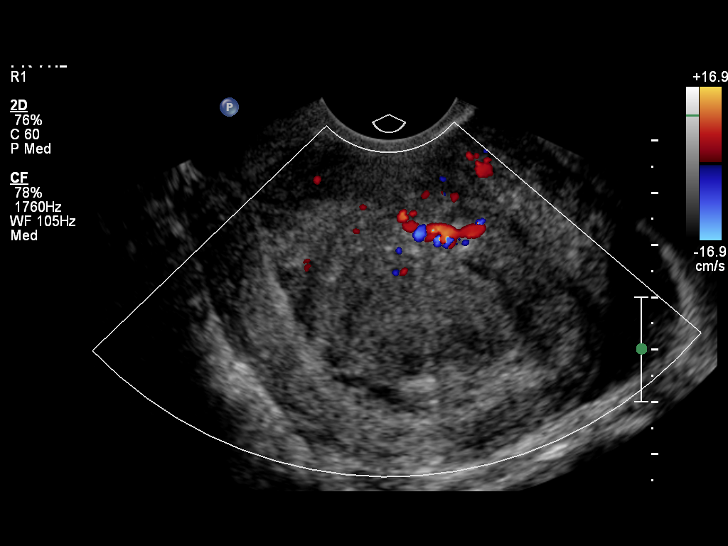
[im 31/40]
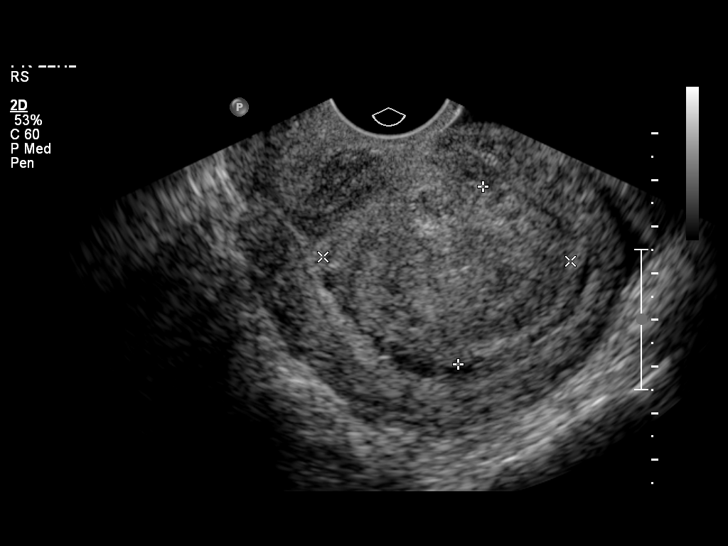
[im 34/40]
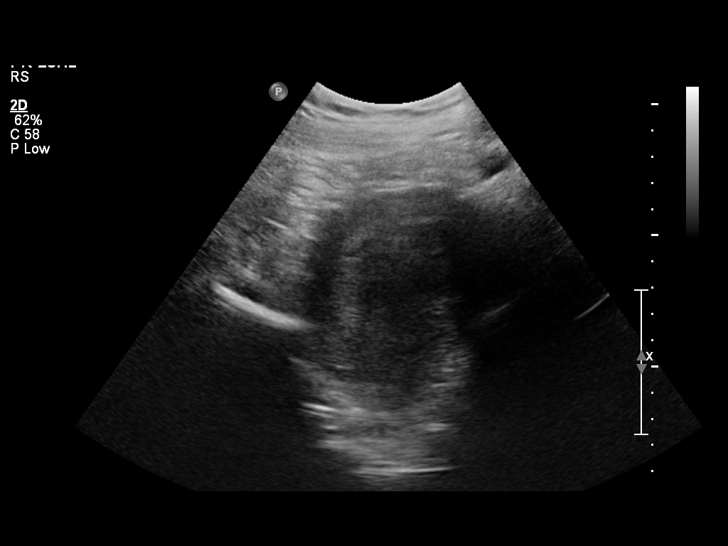
[im 37/40]
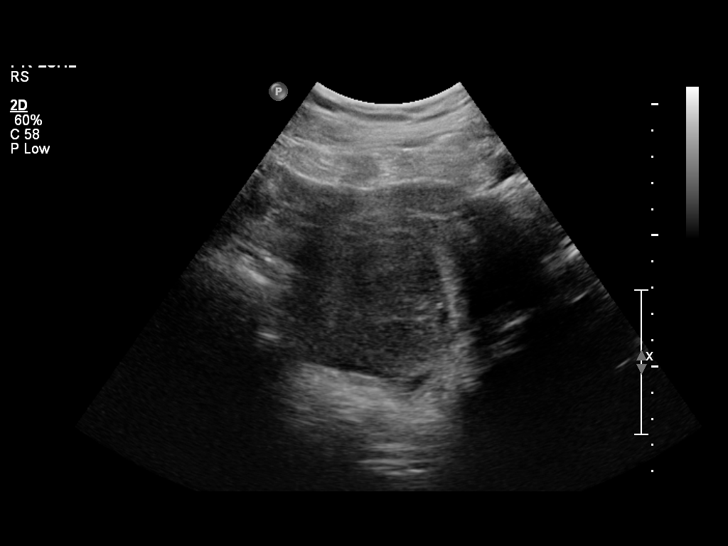
[im 40/40]
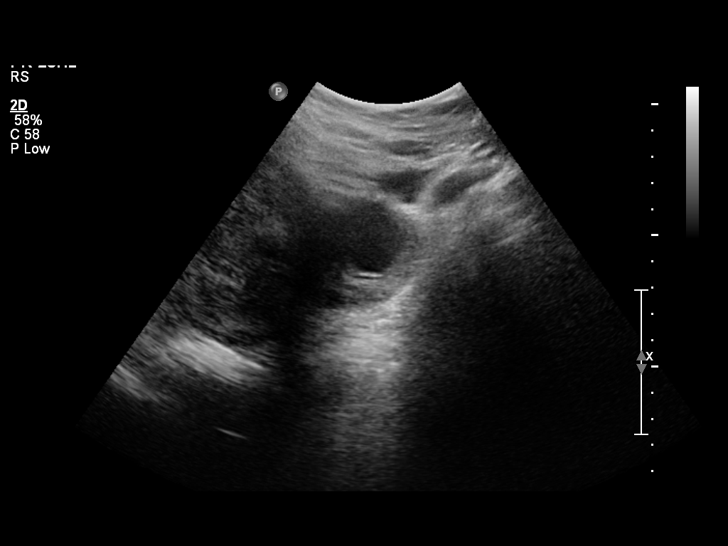

[14 of 28 positions shown; findings below may reference images not displayed]

FINDINGS: Transabdominal and transvaginal sonographic imaging of the pelvis
demonstrates an increase in size of the previously demonstrated 2.4 x 1.1 cm
fundal uterine fibroid. This currently measures 5.3 x 4.0 x 4.0 cm in maximum
dimensions. The endometrial stripe cannot be identified separate from the
fibroid. No intrauterine or extrauterine gestational sac is seen. A 2.2 cm
simple appearing left ovarian cyst is noted as well as an adjacent follicle.
Normal appearing right ovary. Small amount of free peritoneal fluid.

IMPRESSION

1. No intrauterine pregnancy or adnexal mass seen. This does not exclude the
possibility of an early normal intrauterine pregnancy or ectopic pregnancy. 
2. Significant increase in size of the previously seen submucosal uterine
fibroid.
3. 2.2 cm simple appearing left ovarian cyst.
4. Small amount of free peritoneal fluid

## 2006-12-05 ENCOUNTER — Inpatient Hospital Stay (HOSPITAL_COMMUNITY): Admission: AD | Admit: 2006-12-05 | Discharge: 2006-12-05 | Payer: Self-pay | Admitting: Obstetrics

## 2007-01-28 ENCOUNTER — Emergency Department (HOSPITAL_COMMUNITY): Admission: EM | Admit: 2007-01-28 | Discharge: 2007-01-28 | Payer: Self-pay | Admitting: Emergency Medicine

## 2007-07-17 ENCOUNTER — Inpatient Hospital Stay (HOSPITAL_COMMUNITY): Admission: AD | Admit: 2007-07-17 | Discharge: 2007-07-17 | Payer: Self-pay | Admitting: Obstetrics

## 2007-08-07 ENCOUNTER — Inpatient Hospital Stay (HOSPITAL_COMMUNITY): Admission: RE | Admit: 2007-08-07 | Discharge: 2007-08-10 | Payer: Self-pay | Admitting: Obstetrics

## 2007-08-07 ENCOUNTER — Encounter (INDEPENDENT_AMBULATORY_CARE_PROVIDER_SITE_OTHER): Payer: Self-pay | Admitting: Obstetrics

## 2007-08-12 ENCOUNTER — Inpatient Hospital Stay (HOSPITAL_COMMUNITY): Admission: AD | Admit: 2007-08-12 | Discharge: 2007-08-14 | Payer: Self-pay | Admitting: Obstetrics

## 2007-11-04 ENCOUNTER — Inpatient Hospital Stay (HOSPITAL_COMMUNITY): Admission: AD | Admit: 2007-11-04 | Discharge: 2007-11-04 | Payer: Self-pay | Admitting: Obstetrics

## 2008-02-20 ENCOUNTER — Inpatient Hospital Stay (HOSPITAL_COMMUNITY): Admission: AD | Admit: 2008-02-20 | Discharge: 2008-02-20 | Payer: Self-pay | Admitting: Obstetrics

## 2008-04-14 ENCOUNTER — Inpatient Hospital Stay (HOSPITAL_COMMUNITY): Admission: AD | Admit: 2008-04-14 | Discharge: 2008-04-15 | Payer: Self-pay | Admitting: Obstetrics

## 2008-05-09 ENCOUNTER — Inpatient Hospital Stay (HOSPITAL_COMMUNITY): Admission: AD | Admit: 2008-05-09 | Discharge: 2008-05-09 | Payer: Self-pay | Admitting: Obstetrics

## 2008-09-12 ENCOUNTER — Inpatient Hospital Stay (HOSPITAL_COMMUNITY): Admission: AD | Admit: 2008-09-12 | Discharge: 2008-09-12 | Payer: Self-pay | Admitting: Obstetrics

## 2008-09-12 IMAGING — US US TRANSVAGINAL NON-OB
1 series · 14 of 25 positions shown · non-contrast
Comparison: [DATE]

CLINICAL DATA: Vaginal bleeding.  Passing clots.  Pelvic pain.

TRANSABDOMINAL AND TRANSVAGINAL ULTRASOUND OF PELVIS
TECHNIQUE: Both transabdominal and transvaginal ultrasound
examinations of the pelvis were performed including evaluation of
the uterus, ovaries, adnexal regions, and pelvic cul-de-sac.

[Series 1: us pelvis complete modify · 49 acquisitions, 14 frames shown]
[im 1/49]
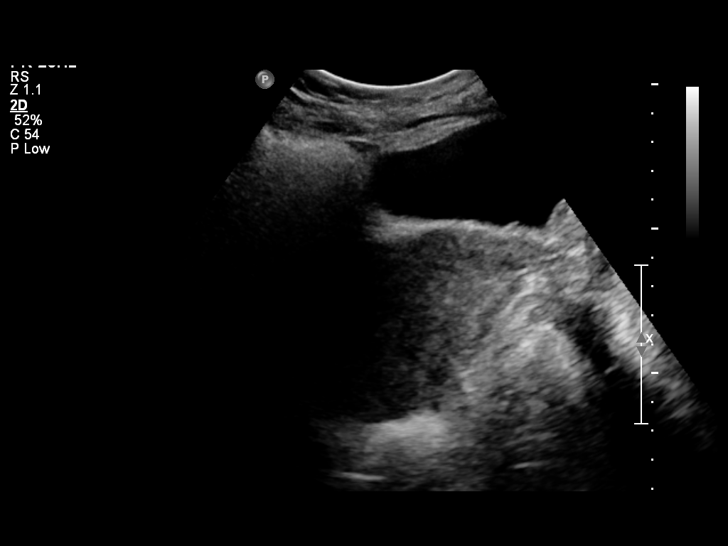
[im 5/49]
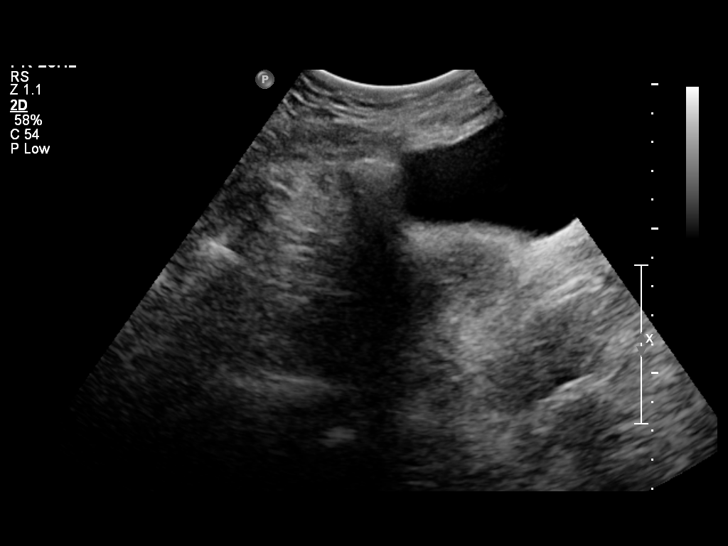
[im 9/49]
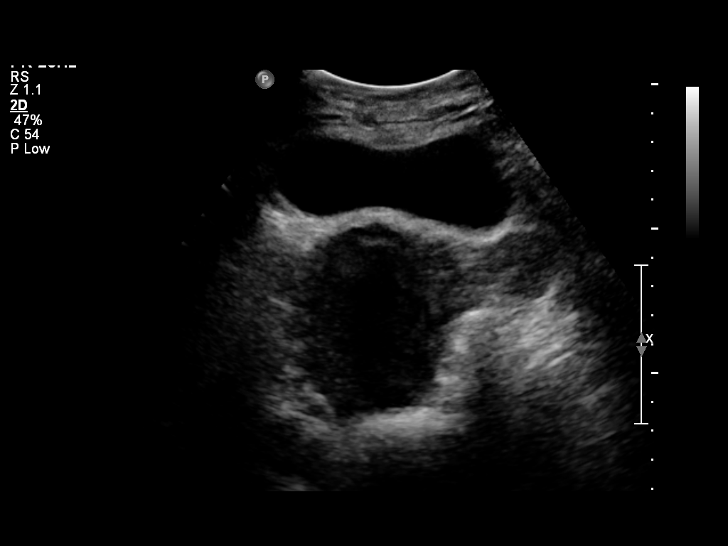
[im 13/49]
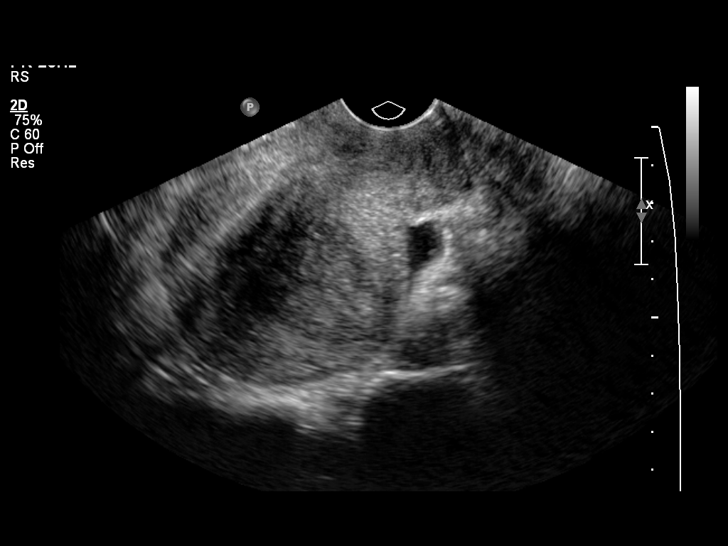
[im 17/49]
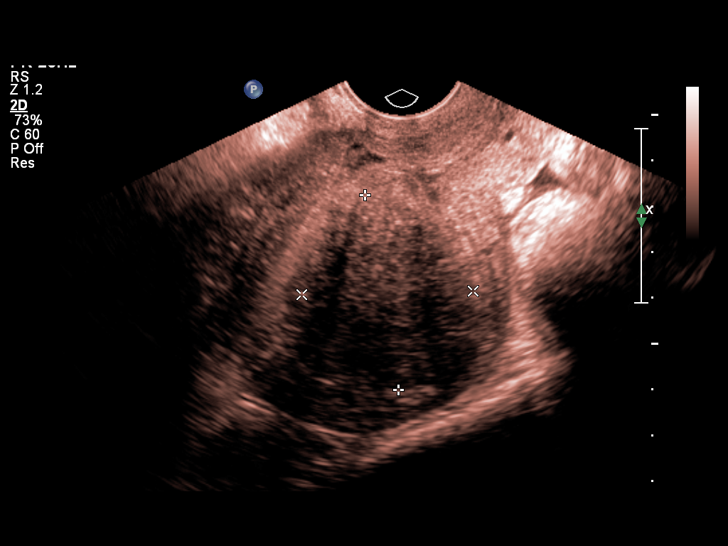
[im 19/49]
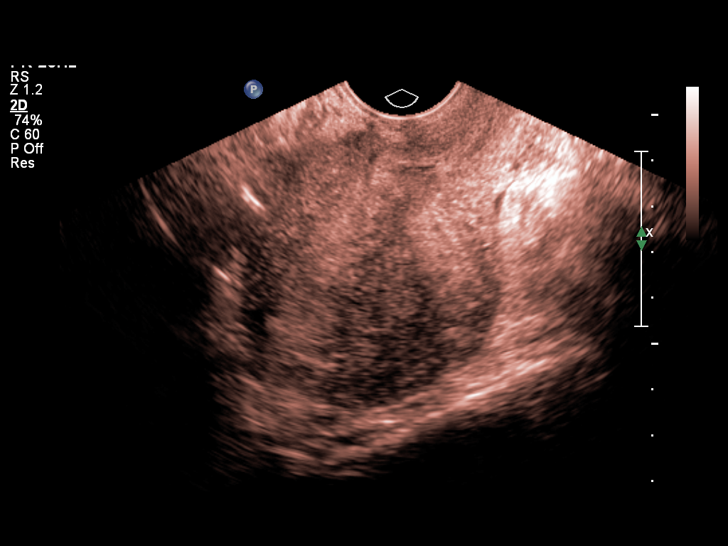
[im 23/49]
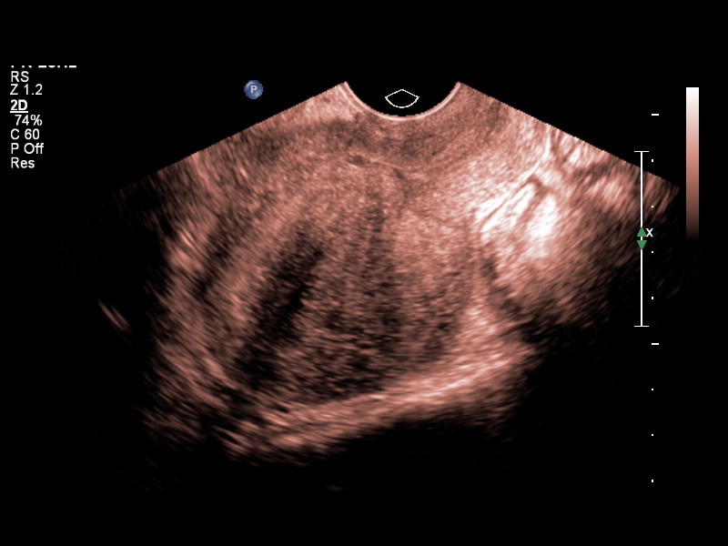
[im 27/49]
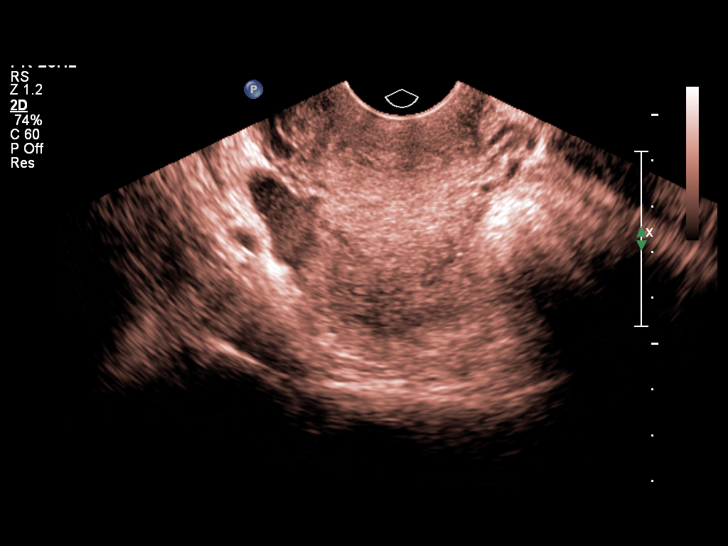
[im 31/49]
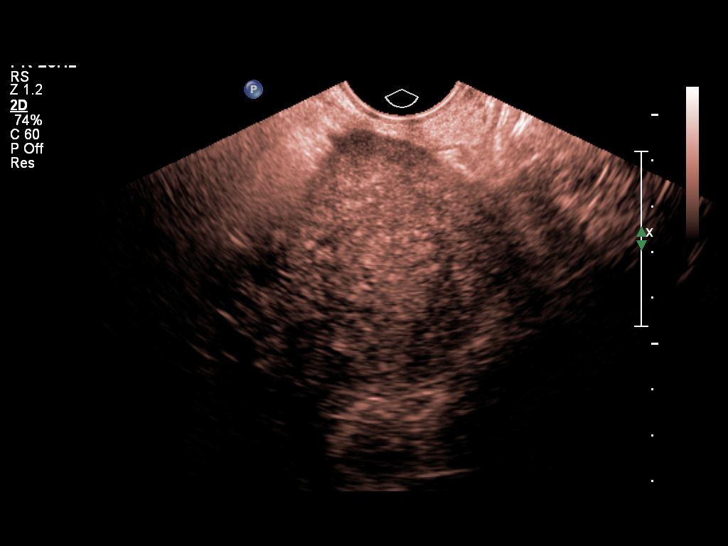
[im 33/49]
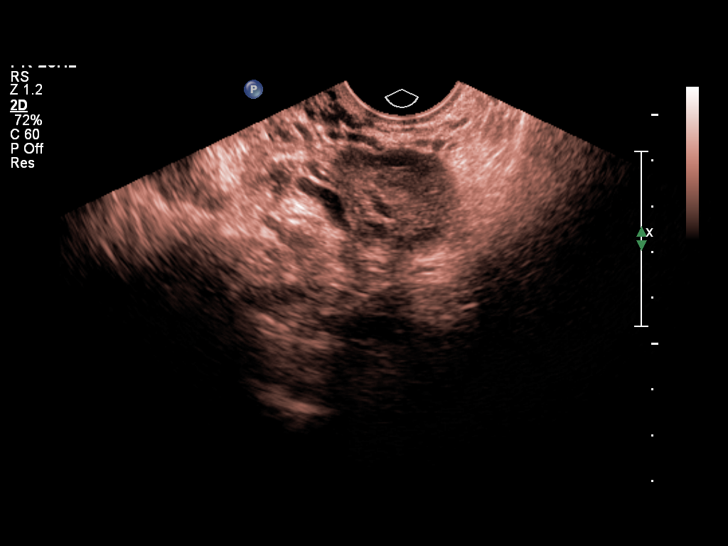
[im 37/49]
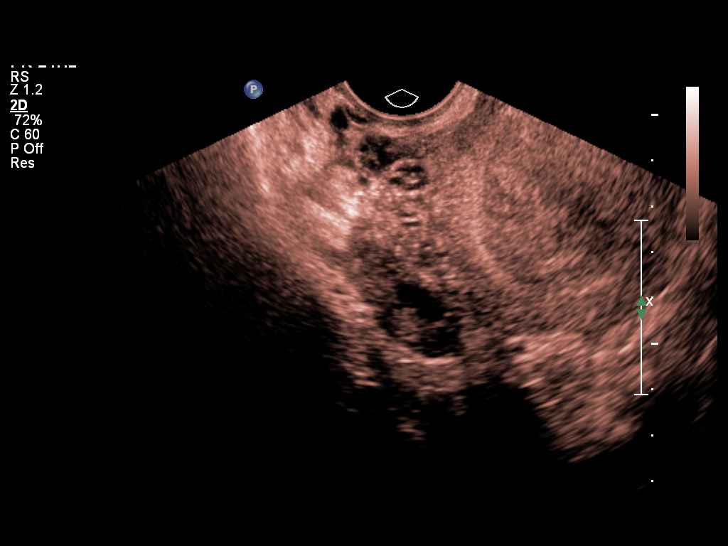
[im 41/49]
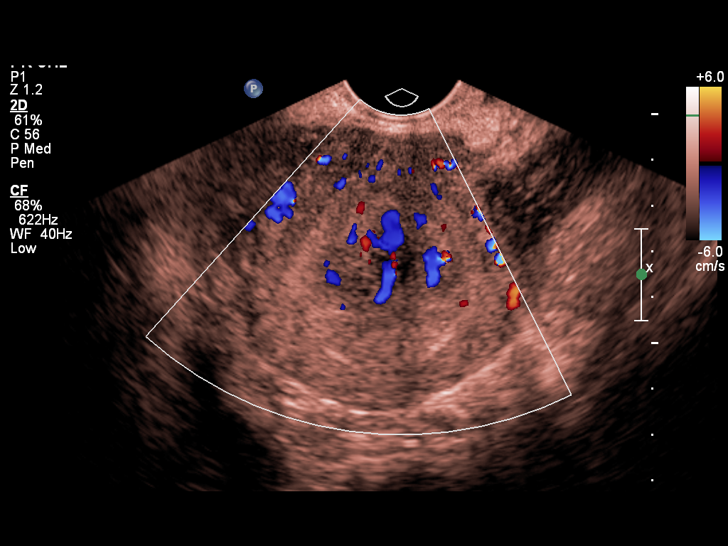
[im 45/49]
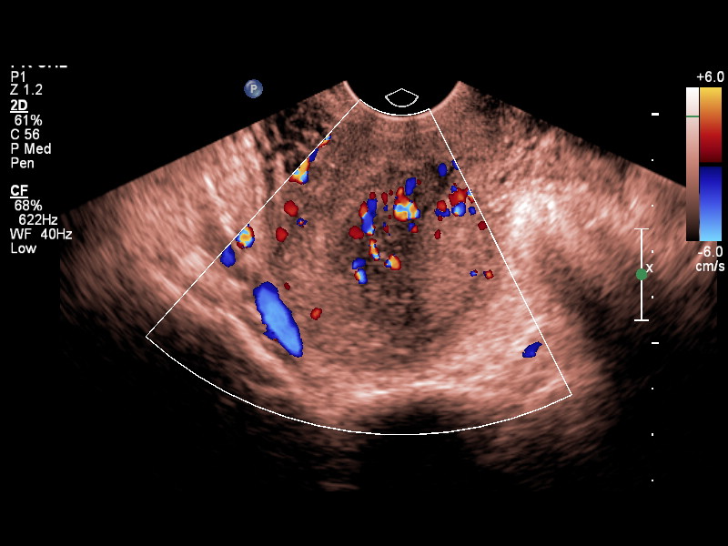
[im 49/49]
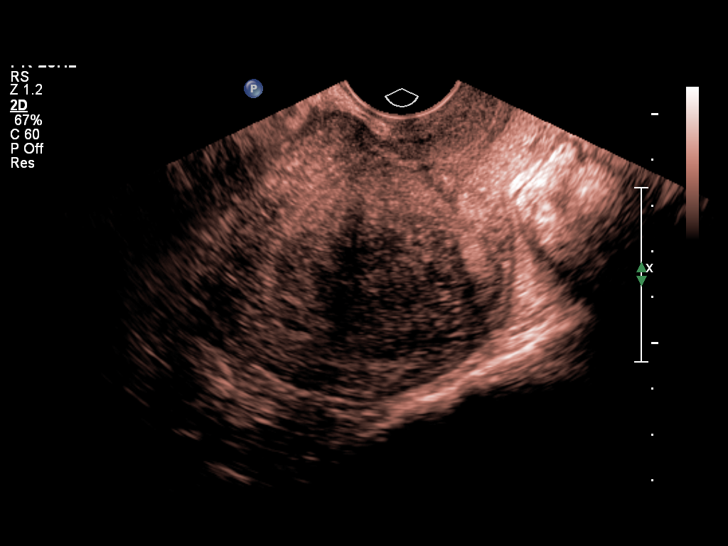

[14 of 25 positions shown; findings below may reference images not displayed]

FINDINGS: Uterus:  Measures 8.9 x 5.8 x 6.3 cm.  There is a 4.4 x 3.9 x
cm mass which is most likely a large submucosal fibroid.  A large
endometrial polyp is much less likely.  This was also noted on the
prior ultrasound examination and is stable.

Endometrium:  Not well visualized.

Right Ovary:  Measures 2.8 x 1.7 x 1.8 cm.

Left Ovary:  Measures 3.2 x 2.6 x 2.1 cm.

Other Findings:  No free pelvic fluid collections.
IMPRESSION: 1.  4 cm uterine mass is most likely a large submucosal fibroid.
This appears stable overall when compared with the prior ultrasound
examination from [DATE].
2.  Normal sonographic appearance of both ovaries.

## 2008-10-08 ENCOUNTER — Inpatient Hospital Stay (HOSPITAL_COMMUNITY): Admission: AD | Admit: 2008-10-08 | Discharge: 2008-10-08 | Payer: Self-pay | Admitting: Obstetrics

## 2008-10-16 ENCOUNTER — Emergency Department (HOSPITAL_COMMUNITY): Admission: EM | Admit: 2008-10-16 | Discharge: 2008-10-16 | Payer: Self-pay | Admitting: Emergency Medicine

## 2008-10-16 IMAGING — CR DG CHEST 2V
2 series · 2 of 2 positions shown · non-contrast
Comparison: None

CLINICAL DATA: Cough and congestion.

CHEST - 2 VIEW

[w chest pa]
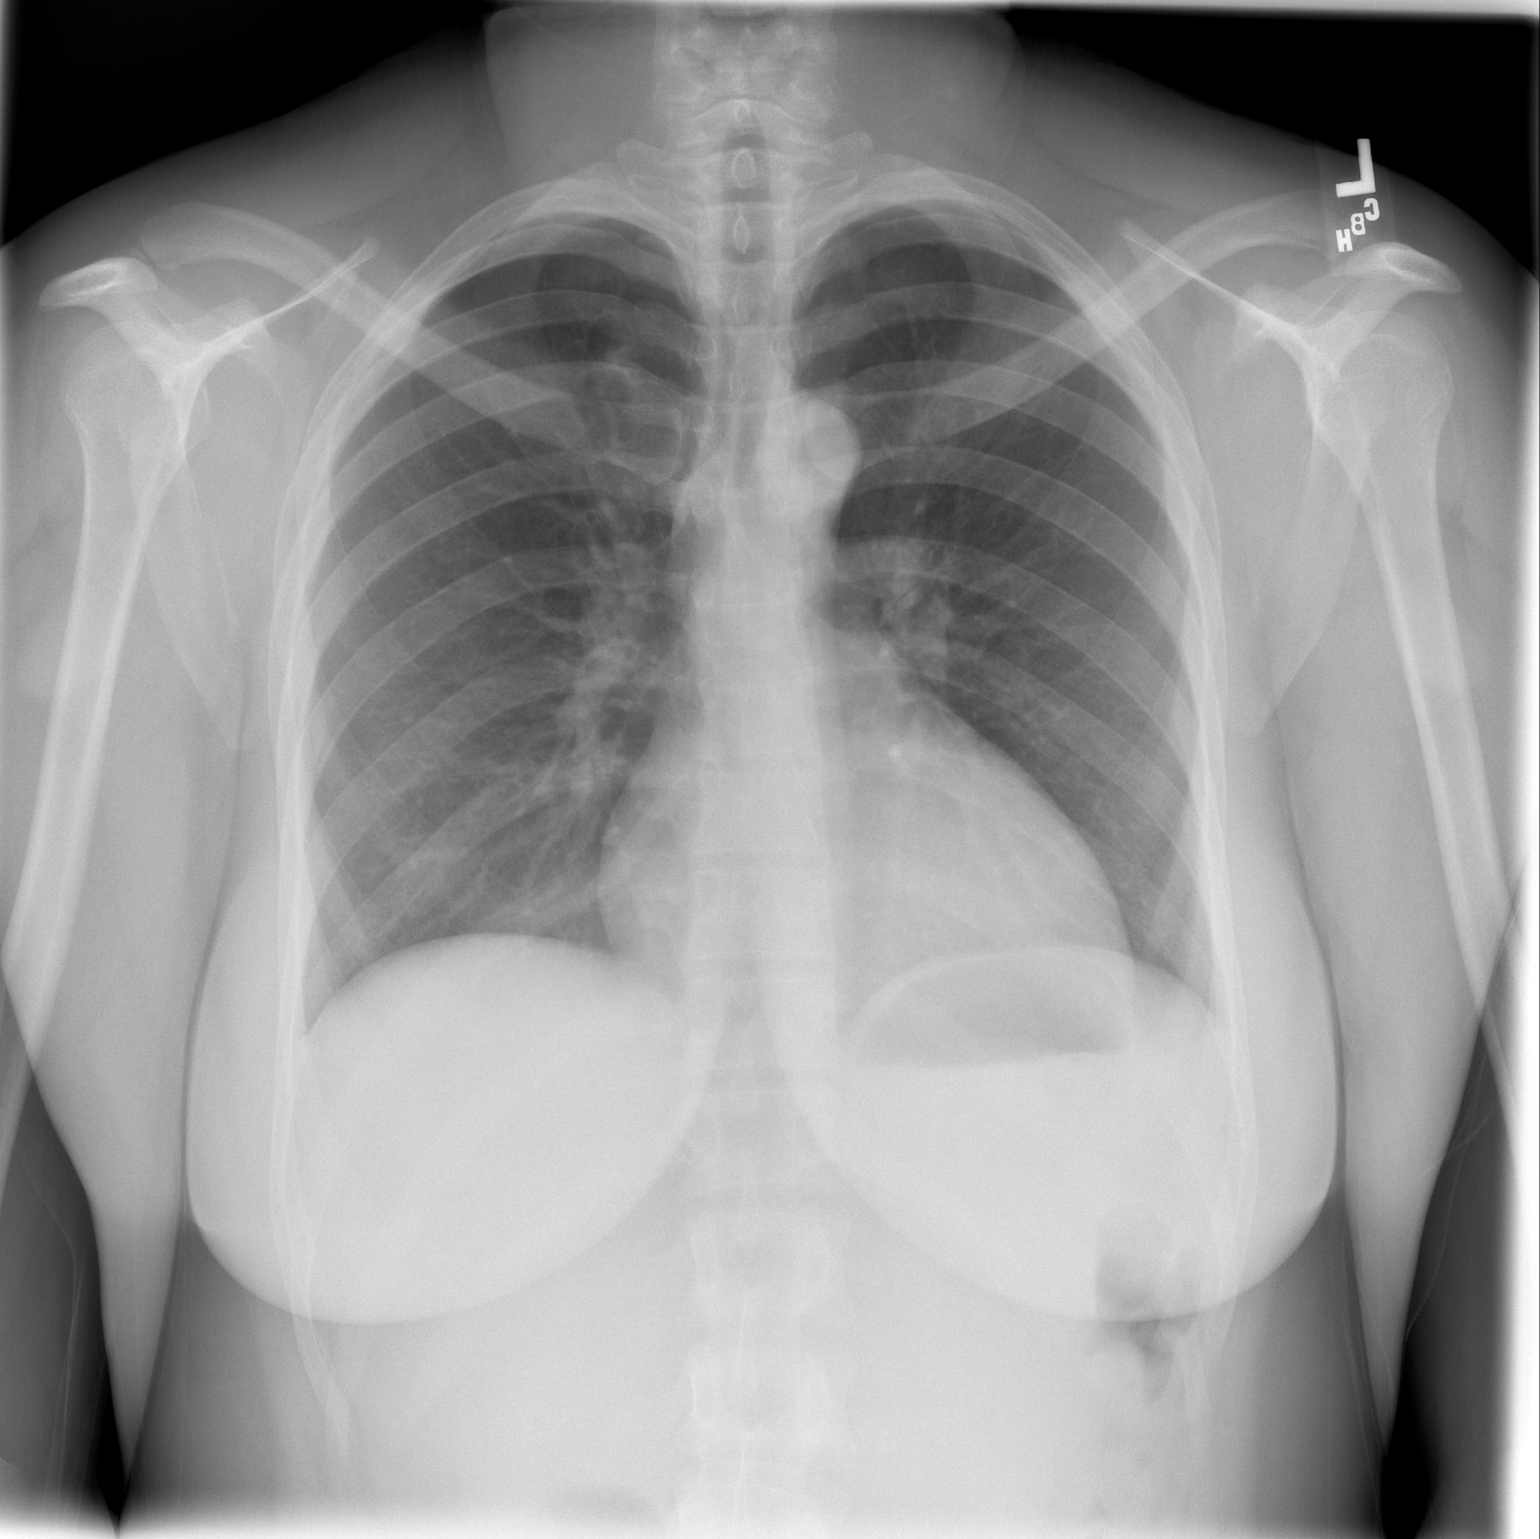

[w chest lat]
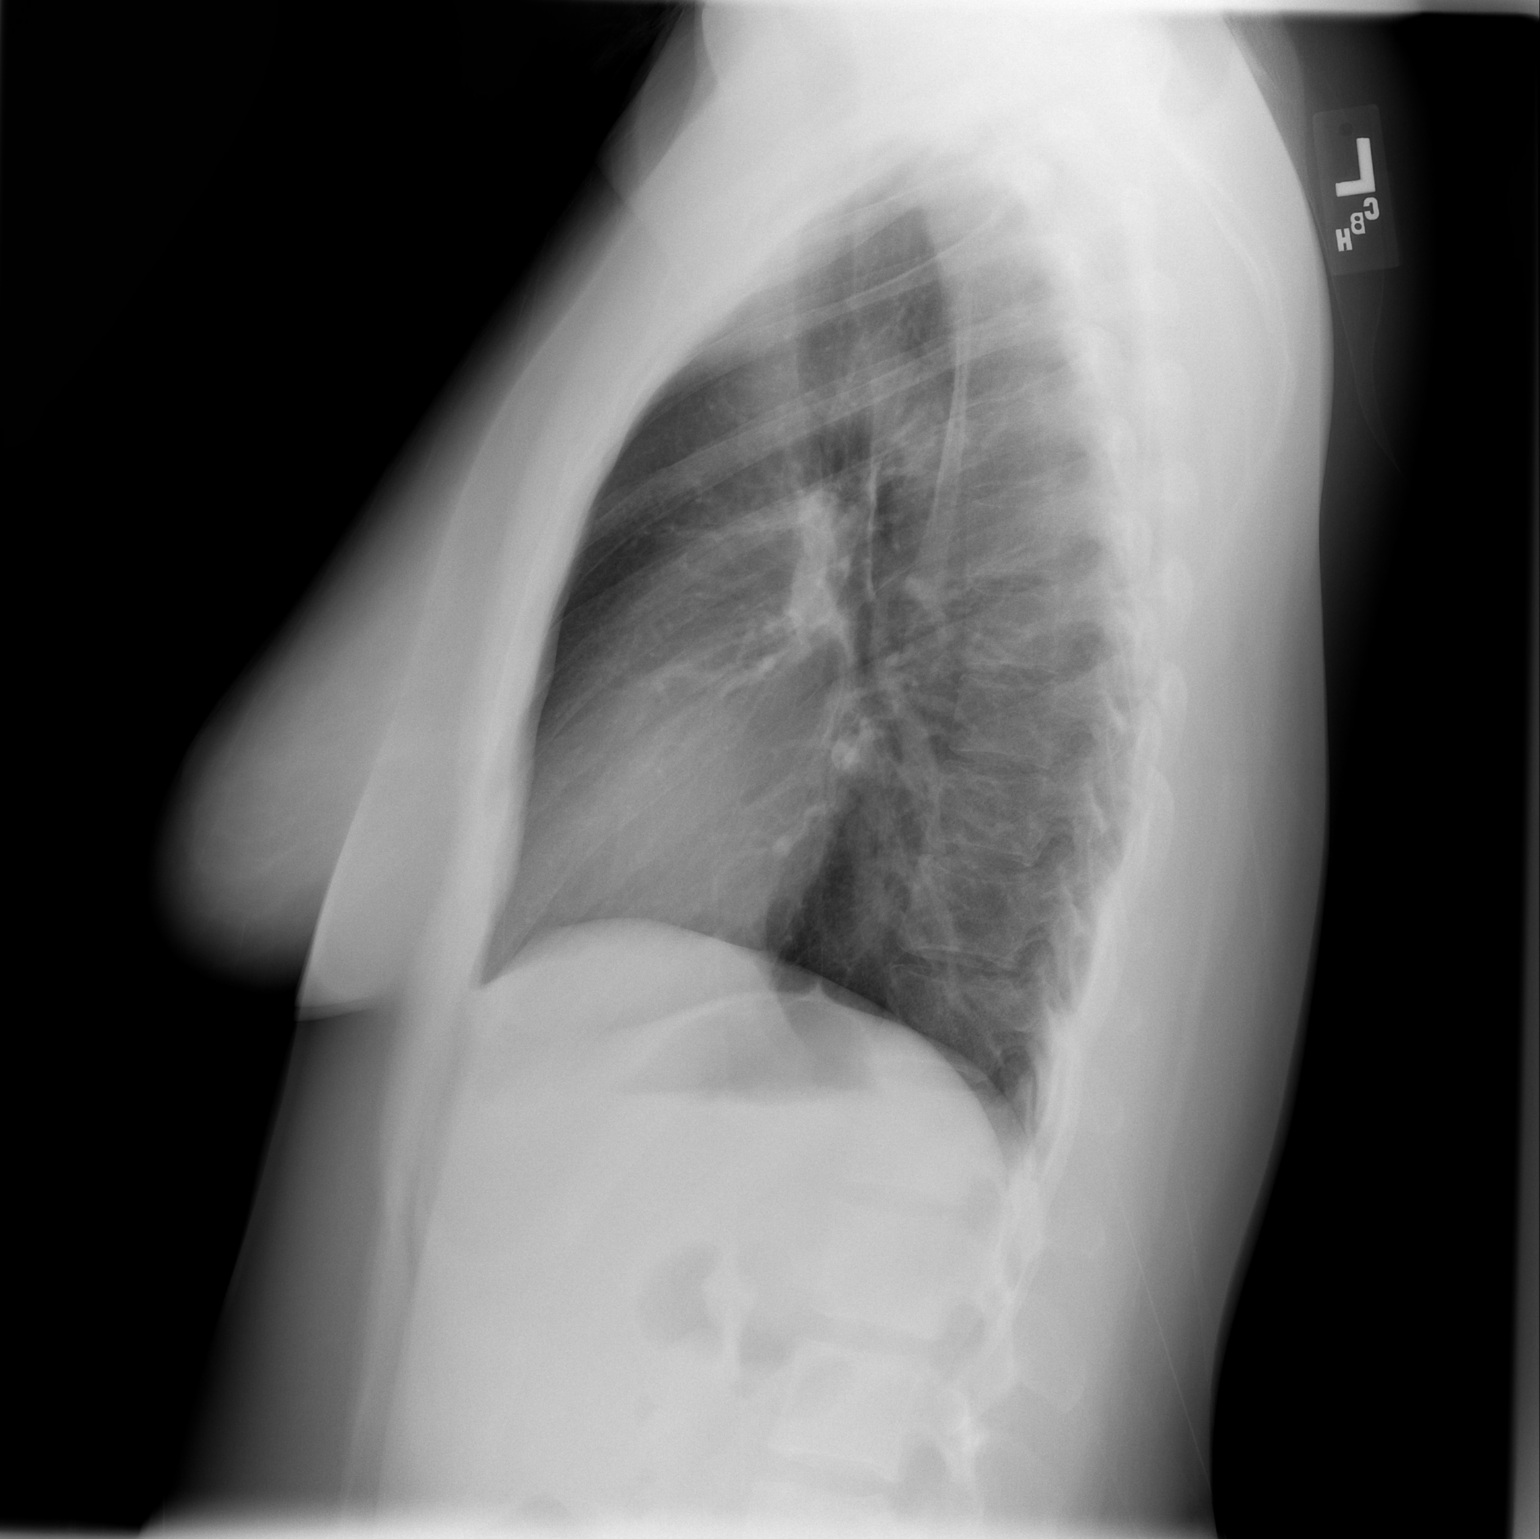

[2 of 2 positions shown; findings below may reference images not displayed]

FINDINGS: The cardiac silhouette, mediastinal and hilar contours
are within normal limits.  The lungs are clear.  The bony thorax is
intact.
IMPRESSION: No acute cardiopulmonary findings.

## 2008-11-01 ENCOUNTER — Emergency Department (HOSPITAL_COMMUNITY): Admission: EM | Admit: 2008-11-01 | Discharge: 2008-11-01 | Payer: Self-pay | Admitting: Family Medicine

## 2008-11-11 ENCOUNTER — Inpatient Hospital Stay (HOSPITAL_COMMUNITY): Admission: AD | Admit: 2008-11-11 | Discharge: 2008-11-12 | Payer: Self-pay | Admitting: Obstetrics & Gynecology

## 2008-11-25 ENCOUNTER — Inpatient Hospital Stay (HOSPITAL_COMMUNITY): Admission: RE | Admit: 2008-11-25 | Discharge: 2008-11-28 | Payer: Self-pay | Admitting: Obstetrics & Gynecology

## 2008-11-26 ENCOUNTER — Encounter: Payer: Self-pay | Admitting: Obstetrics & Gynecology

## 2009-04-19 ENCOUNTER — Emergency Department (HOSPITAL_COMMUNITY): Admission: EM | Admit: 2009-04-19 | Discharge: 2009-04-19 | Payer: Self-pay | Admitting: Emergency Medicine

## 2009-04-19 IMAGING — CR DG NECK SOFT TISSUE
2 series · 2 of 2 positions shown · non-contrast
Comparison: [DATE]

CLINICAL DATA: Crochet needle stuck in tonsils.

NECK SOFT TISSUES - 1+ VIEW

[w soft tissue neck (1 of 2)]
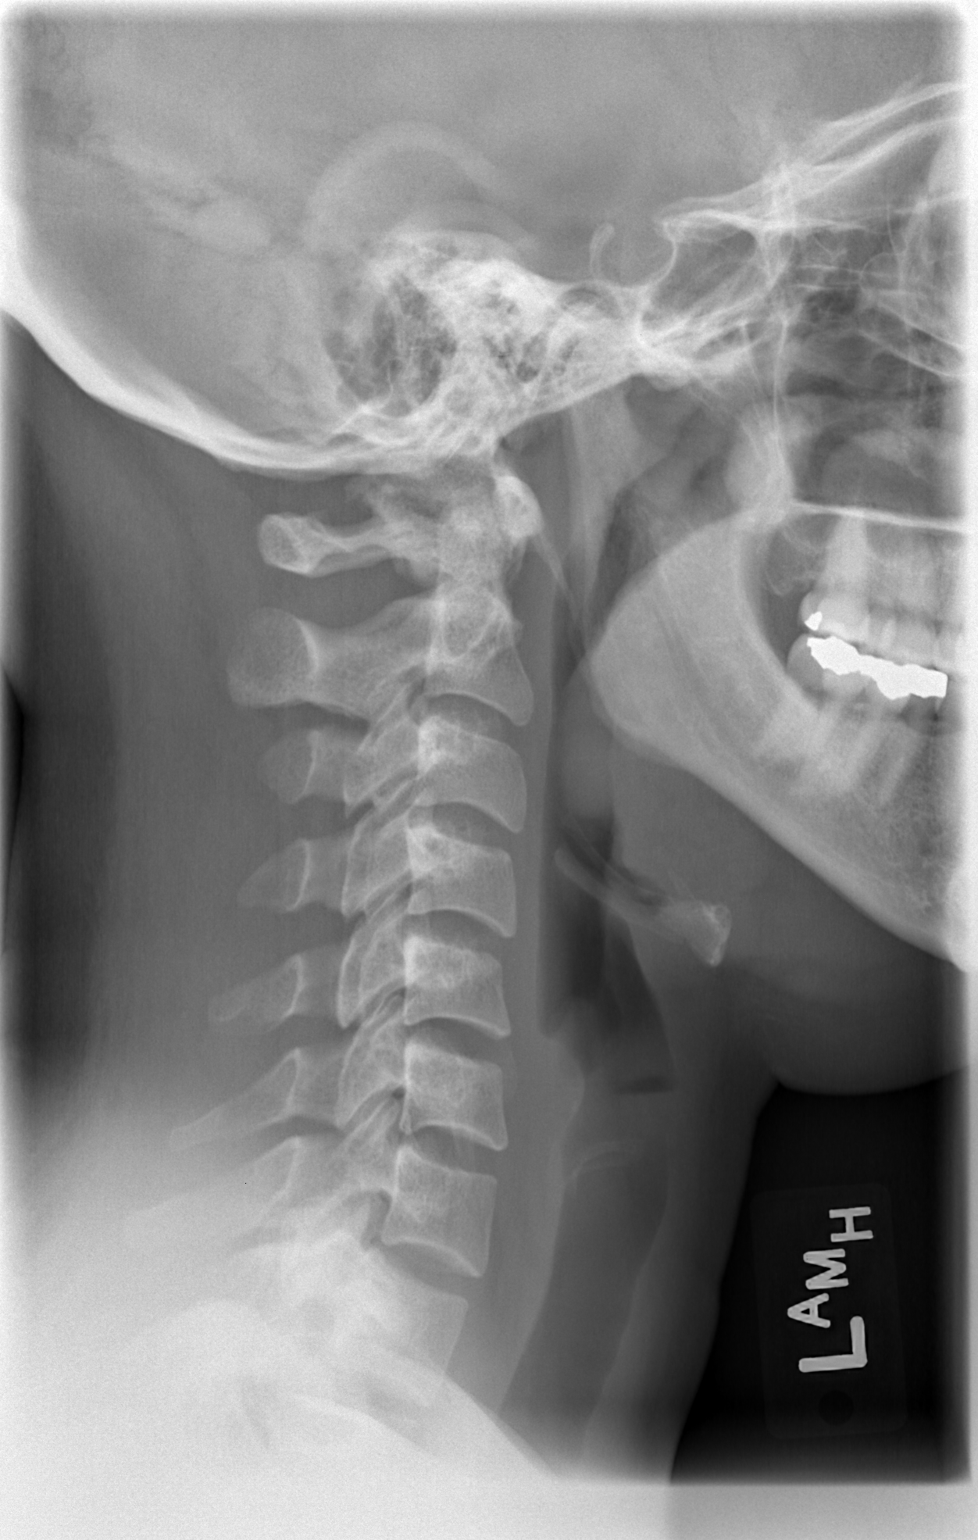

[w soft tissue neck (2 of 2)]
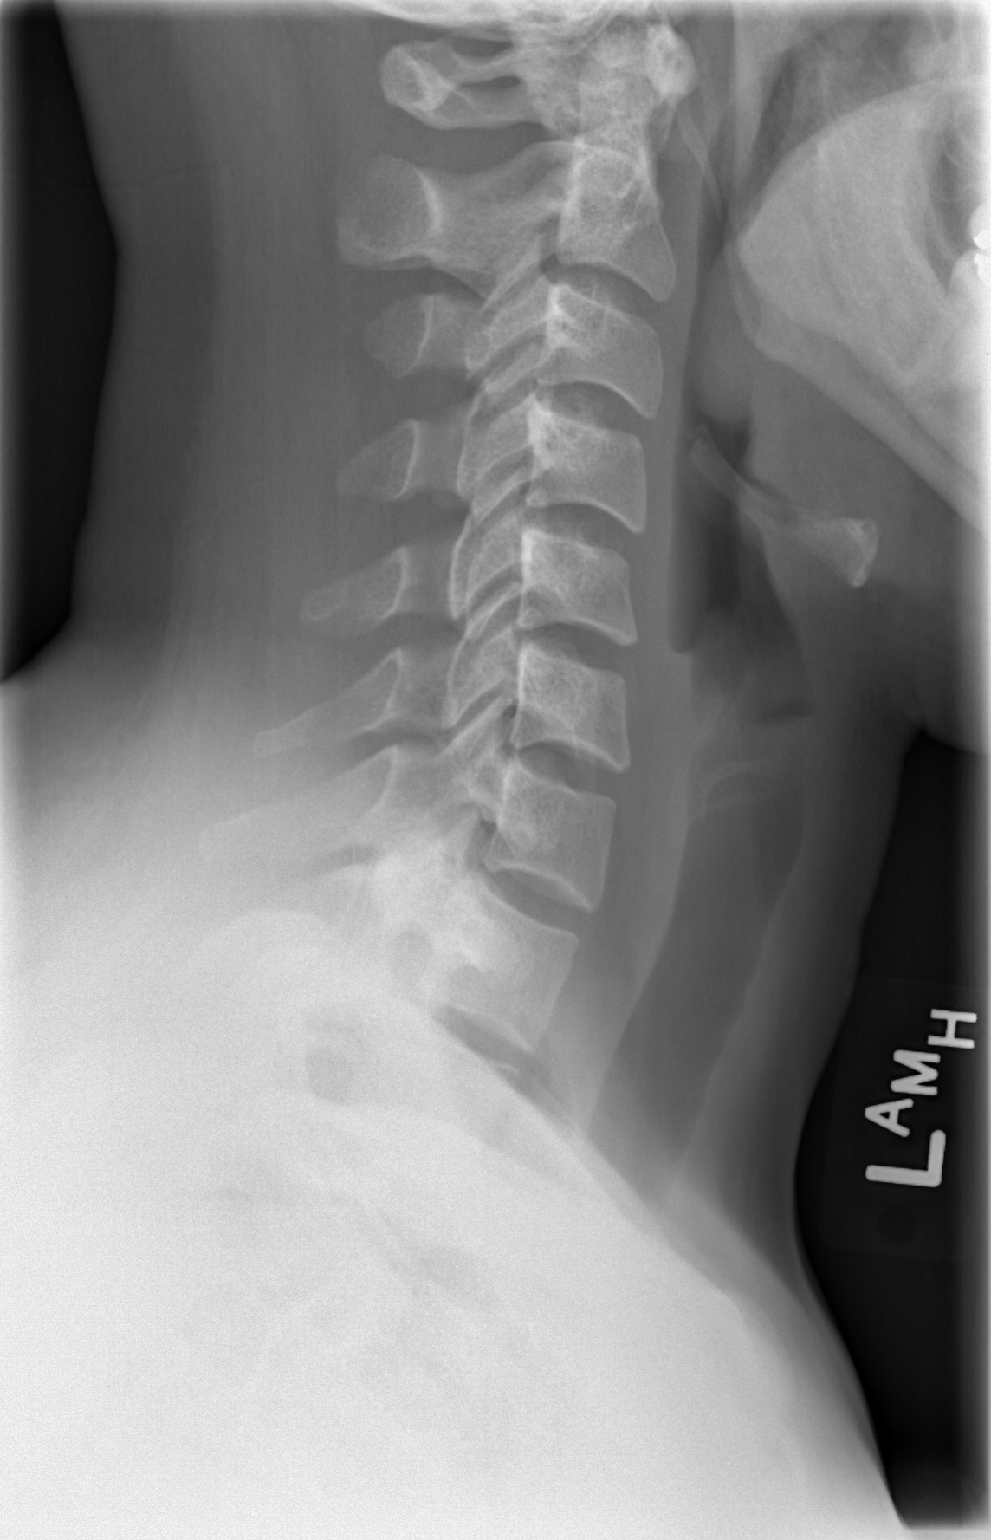

[2 of 2 positions shown; findings below may reference images not displayed]

FINDINGS: No foreign body is noted - if the crochet needle was
swallowed or aspirated then further imaging workup is likely
warranted.

No prevertebral soft tissue swelling is noted.  There is a
suggestion of uvular or lingual tonsillar prominence similar to the
prior exam.
IMPRESSION: 1.
1.  No foreign body is seen in the neck.
2.  No prevertebral abscess or abnormal gas tracking in the tissues
of the neck.

## 2010-01-09 ENCOUNTER — Emergency Department (HOSPITAL_COMMUNITY): Admission: EM | Admit: 2010-01-09 | Discharge: 2010-01-09 | Payer: Self-pay | Admitting: Emergency Medicine

## 2010-03-30 ENCOUNTER — Emergency Department (HOSPITAL_COMMUNITY): Admission: EM | Admit: 2010-03-30 | Discharge: 2010-03-30 | Payer: Self-pay | Admitting: Family Medicine

## 2010-08-15 LAB — COMPREHENSIVE METABOLIC PANEL
ALT: 14 U/L (ref 0–35)
AST: 19 U/L (ref 0–37)
Alkaline Phosphatase: 56 U/L (ref 39–117)
BUN: 5 mg/dL — ABNORMAL LOW (ref 6–23)
GFR calc Af Amer: >60 mL/min (ref >60)
Potassium: 3.4 mEq/L — ABNORMAL LOW (ref 3.5–5.1)
Sodium: 137 mEq/L (ref 135–145)
Total Protein: 6.6 g/dL (ref 6.0–8.3)

## 2010-08-15 LAB — CBC
HCT: 34.9 % — ABNORMAL LOW (ref 36.0–46.0)
MCV: 68.5 fL — ABNORMAL LOW (ref 78.0–100.0)
MCV: 69.3 fL — ABNORMAL LOW (ref 78.0–100.0)
RBC: 4.5 MIL/uL (ref 3.87–5.11)
RBC: 5.09 MIL/uL (ref 3.87–5.11)
RDW: 30.7 % — ABNORMAL HIGH (ref 11.5–15.5)
WBC: 11.2 10*3/uL — ABNORMAL HIGH (ref 4.0–10.5)

## 2010-08-15 LAB — PLATELET FUNCTION ASSAY: Collagen / Epinephrine: 100 seconds (ref 0–180)

## 2010-08-15 LAB — TYPE AND SCREEN: Antibody Screen: NEGATIVE

## 2010-08-15 LAB — PROTIME-INR: Prothrombin Time: 13.6 seconds (ref 11.6–15.2)

## 2010-08-16 LAB — CBC
HCT: 22.9 % — ABNORMAL LOW (ref 36.0–46.0)
HCT: 34 % — ABNORMAL LOW (ref 36.0–46.0)
Hemoglobin: 5.8 g/dL — CL (ref 12.0–15.0)
Hemoglobin: 6.7 g/dL — CL (ref 12.0–15.0)
Platelets: 486 10*3/uL — ABNORMAL HIGH (ref 150–400)
RBC: 3.64 MIL/uL — ABNORMAL LOW (ref 3.87–5.11)
RDW: 21.7 % — ABNORMAL HIGH (ref 11.5–15.5)
WBC: 5.5 10*3/uL (ref 4.0–10.5)
WBC: 5.7 10*3/uL (ref 4.0–10.5)
WBC: 6.6 10*3/uL (ref 4.0–10.5)

## 2010-08-16 LAB — CROSSMATCH: Antibody Screen: NEGATIVE

## 2010-08-16 LAB — ABO/RH: ABO/RH(D): B POS

## 2010-08-16 LAB — POCT PREGNANCY, URINE: Preg Test, Ur: NEGATIVE

## 2010-08-17 LAB — CBC
HCT: 23.2 % — ABNORMAL LOW (ref 36.0–46.0)
Hemoglobin: 6.9 g/dL — CL (ref 12.0–15.0)
MCHC: 29.6 g/dL — ABNORMAL LOW (ref 30.0–36.0)
RDW: 21 % — ABNORMAL HIGH (ref 11.5–15.5)

## 2010-08-17 LAB — WET PREP, GENITAL

## 2010-08-23 LAB — WET PREP, GENITAL
Trich, Wet Prep: NONE SEEN
Yeast Wet Prep HPF POC: NONE SEEN

## 2010-08-23 LAB — GC/CHLAMYDIA PROBE AMP, GENITAL: GC Probe Amp, Genital: NEGATIVE

## 2010-09-21 NOTE — H&P (Signed)
[name], [name]               ACCOUNT NO.:  [id number]   MEDICAL RECORD NO.:  [id number]          PATIENT TYPE:  INP   LOCATION:  9131                          FACILITY:  [facility]   PHYSICIAN:  Kathreen Cosier, M.D.DATE OF BIRTH:  1975/06/20   DATE OF ADMISSION:  08/07/2007  DATE OF DISCHARGE:                              HISTORY & PHYSICAL   The patient is a 35 year old gravida 6, para 4-1-0-4, [facility] August 04, 2007.  She was in for induction.  Cervix 2-3 cm, 70%, the vertex -3.  She had a history of one C-section in the past and the rest vaginal.  Membranes were ruptured artificially, fluid clear.  IUPC inserted.  She  had negative GBS and HIV.  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  It was noted  that, without Pitocin, she started contracting mildly less than a minute  apart and fetal heart was fine.  She was on oxygen and given terbutaline  0.25 subcu.  The contraction pattern and then eventually she resumed in  a normal pattern.  This continued throughout the course of the day and  by 4:50 p.m., she had been having consistent late decelerations with  each contraction for 45 minutes.  She was on O2 and there was no  resolution of the lates.  Cervix was unchanged at 3 cm and it was  decided she would have a repeat C-section for nonreassuring fetal heart  rate tracing.   PHYSICAL EXAMINATION:  GENERAL APPEARANCE:  A well-developed female in  labor.  HEENT: Negative.  LUNGS: Clear.  HEART:  Regular rhythm, no murmurs or gallops.  BREASTS:  No masses.  ABDOMEN:  Term size uterus.  Estimated date was 6-7.  EXTREMITIES:  Negative.           ______________________________  Kathreen Cosier, M.D.     BAM/MEDQ  D:  08/07/2007  T:  08/08/2007  Job:  [id number]

## 2010-09-21 NOTE — Discharge Summary (Signed)
[name], [name]               ACCOUNT NO.:  [id number]   MEDICAL RECORD NO.:  [id number]          PATIENT TYPE:  INP   LOCATION:  9313                          FACILITY:  [facility]   PHYSICIAN:  Charles A. Clearance Coots, M.D.DATE OF BIRTH:  1976-05-05   DATE OF ADMISSION:  11/25/2008  DATE OF DISCHARGE:  11/28/2008                               DISCHARGE SUMMARY   ADMITTING DIAGNOSES:  Symptomatic uterine fibroids, severe anemia.   DISCHARGE DIAGNOSES:  Symptomatic uterine fibroids, severe anemia,  status post total abdominal hysterectomy, discharged home in good  condition.   REASON FOR ADMISSION:  A 35 year old female with a history of uterine  fibroids presented with complaints of vaginal bleeding.  The patient was  recently admitted for several weeks ago with similar complaints.  On  admission, she was found to be profoundly anemic with a hemoglobin of  5.8, a post transfusion hemoglobin was 10.5.  She was discharged home on  Premarin and Provera.  She presents today with heavy vaginal bleeding  and passing clots.  She also reports associated cramping.  She has a  history of small fibroid uterus with a fairly large submucous myoma  approximately 4 cm in diameter.   PAST MEDICAL HISTORY:   SURGERY:  Two previous cesarean sections.   ILLNESSES:  Anemia, genital herpes, eczema.   MEDICATIONS:  Please see medication reconsolidation form.   ALLERGIES:  No known drug allergies   SOCIAL HISTORY:  Negative for tobacco, alcohol, or recreational drug  use.   FAMILY HISTORY:  Noncontributory.   REVIEW OF SYSTEMS:  Remarkable for genitourinary system with heavy  vaginal bleeding and cramping.   PHYSICAL EXAMINATION:  GENERAL:  A well-nourished, well-developed female  in no acute distress, afebrile.  VITAL SIGNS:  Blood pressure 130/88, pulse 104, temperature 98.  LUNGS:  Clear to auscultation bilaterally.  HEART:  Regular rate and rhythm.  ABDOMEN:  Nontender.  No organomegaly.  PELVIC:  On speculum exam, there is blood in the vaginal vault.  On  bimanual exam, uterus appears upper limits of normal in size, globular  and nontender.  The adnexa are nonpalpable and nontender.   IMPRESSION:  Symptomatic uterine fibroids, primarily submucosal fibroid  with refractory bleeding, failed attempts at medical management, history  of severe anemia.  Desires definitive surgical therapy.   PLAN:  Total abdominal hysterectomy.   ADMITTING LABORATORY FINDINGS:  Hemoglobin 10.9, hematocrit 34.9, white  blood cell count 11,200, platelets 504,000.  Coags were within normal  limits.  Comprehensive metabolic panel was within normal limits.   HOSPITAL COURSE:  The patient underwent a total abdominal hysterectomy  on November 26, 2008.  There were no intraoperative complications.  Postoperative course was uncomplicated.  The patient was discharged home  on postop day #2 in good condition.   DISCHARGE LABORATORY FINDINGS:  Hemoglobin 9.6, hematocrit 31.2, white  blood cell count 14,300, platelets 387,000.   DISCHARGE DISPOSITION:  Medications; Percocet and ibuprofen were  prescribed for pain.  Routine written instructions were given for  discharge after hysterectomy.  The patient is to call our office for a  followup  appointment in 2 weeks.      Charles A. Clearance Coots, M.D.  Electronically Signed     CAH/MEDQ  D:  11/28/2008  T:  11/28/2008  Job:  [id number]

## 2010-09-21 NOTE — Op Note (Signed)
[name], [name]               ACCOUNT NO.:  [id number]   MEDICAL RECORD NO.:  [id number]          PATIENT TYPE:  INP   LOCATION:  9131                          FACILITY:  [facility]   PHYSICIAN:  Kathreen Cosier, M.D.DATE OF BIRTH:  [birth year]   DATE OF PROCEDURE:  08/07/2007  DATE OF DISCHARGE:                               OPERATIVE REPORT   PREOPERATIVE DIAGNOSES:  1. Intrauterine pregnancy at 40+ weeks, failed induction.  2. Previous cesarean section with repeat cesarean section.   POSTOPERATIVE DIAGNOSES:  1. Intrauterine pregnancy at 40+ weeks, failed induction.  2. Previous cesarean section with repeat cesarean section.   SURGEON:  Kathreen Cosier, M.D.   ANESTHESIA:  Epidural.   PROCEDURE:  Patient was placed on the operating room table in a supine  position.  Abdomen prepped and draped.  Bladder emptied with Foley  catheter.  A transverse suprapubic incision made through the old scar  and carried down through the rectus fascia.  The fascia cleanly incised  the length of the incision.  Rectal muscles were retracted laterally.  The peritoneum incised longitudinally.  A transverse incision was made  in the visceroperitoneum above the bladder.  The bladder was mobilized  inferiorly.  The transverse lower uterine incision made.  The patient  was delivered from the LOA position of a female, Apgars 8 and 9, weight  4.15 ounces with team in attendance.  Fluid was clear. The placenta was  posterior, removed manually, and sent to pathology.  The uterine cavity  was cleaned with dry laps.  The uterine incision was closed in one layer  with continuous suture of #1 chromic.  Hemostasis satisfactory.  Bladder  flap reattached with 2-0 chromic.  Uterus well contracted.  Tubes and  ovaries normal.  Abdomen closed in layers.  Peritoneum continuously with  2-0 chromic.  Fascia continuous with 2-0 Dexon.  The skin closed with a  subcuticular stitch of 4-0 Monocryl.   Blood loss 600  cc.   Patient tolerated the procedure well, returned to the recovery room in  good condition.           ______________________________  Kathreen Cosier, M.D.     BAM/MEDQ  D:  08/07/2007  T:  08/07/2007  Job:  [id number]

## 2010-09-21 NOTE — H&P (Signed)
[name], [name]               ACCOUNT NO.:  [id number]   MEDICAL RECORD NO.:  [id number]          PATIENT TYPE:  INP   LOCATION:  9313                          [facility]ILITY[facility]  [facility]   PHYSICIAN:  Roseanna Rainbow, M.D.DATE OF BIRTH:  [birth year]   DATE OF ADMISSION:  11/25/2008  DATE OF DISCHARGE:                              HISTORY & PHYSICAL   CHIEF COMPLAINT:  The patient is a 35 year old with a history of uterine  fibroids, now complaining of vaginal bleeding.   HISTORY OF PRESENT ILLNESS:  The patient was recently admitted several  weeks ago with similar complaints.  On admission, she was found to be  profoundly anemic with a hemoglobin of 5.8.  A post-transfusion  hemoglobin was 10.5.  She was discharged to home on Premarin and  Provera.  She presents today with heavy vaginal bleeding and passing  blood clots.  She also reports associated cramping.  She has a history  of small fibroid uterus with a fairly large submucous myoma  approximately 4 cm in diameter.   PAST MEDICAL HISTORY:  Please see the above eczema, genital herpes.   FAMILY HISTORY:  Noncontributory.   SURGICAL HISTORY:  There is a history of 2 previous cesarean delivery.   SOCIAL HISTORY:  He is a nonsmoker, nondrinker.  No substance abuse.   PAST GYNECOLOGICAL HISTORY:  Please see the above.  There is history of  an abnormal Pap smear.  There is a history of 3 spontaneous vaginal  deliveries.   ALLERGIES:  No known drug allergies.   MEDICATIONS:  Please see the medication reconciliation form.   REVIEW OF SYSTEMS:  GU:  Please see the above.   PHYSICAL EXAMINATION:  VITAL SIGNS:  Blood pressure 130/88, pulse 104,  temperature 98.  GENERAL:  No apparent distress.  LUNGS:  Clear to auscultation bilaterally.  HEART:  Regular rate and rhythm.  ABDOMEN:  No organomegaly, nontender.  PELVIC:  Normal EGBUS.  On speculum exam, there is blood in the vault.  On bimanual exam, the uterus is upper limits of  normal in size,  globular, nontender.  The adnexa are nonpalpable, nontender.   ASSESSMENT:  Symptomatic uterine fibroids, primarily submucosal with  bleeding refractory, attempts to manage medically, history of severe  anemia.   PLAN:  Admission.  Recheck hemoglobin and transfuse as necessary.  Prep  for a total abdominal hysterectomy.  The risks, benefits, and  alternative forms of management were reviewed with the patient and  informed consent had been obtained.      Roseanna Rainbow, M.D.  Electronically Signed     LAJ/MEDQ  D:  11/25/2008  T:  11/26/2008  Job:  [id number]

## 2010-09-21 NOTE — Op Note (Signed)
NAMELEYDY, WORTHEY               ACCOUNT NO.:  [id number]   MEDICAL RECORD NO.:  [id number]          PATIENT TYPE:  INP   LOCATION:  9313                          [facility]ILITY[facility]  [facility]   PHYSICIAN:  Roseanna Rainbow, M.D.DATE OF BIRTH:  [birth year]   DATE OF PROCEDURE:  11/26/2008  DATE OF DISCHARGE:                               OPERATIVE REPORT   PREOPERATIVE DIAGNOSES:  Symptomatic uterine fibroids with abnormal  uterine bleeding.  History of secondary severe anemia, iron deficiency.   POSTOPERATIVE DIAGNOSES:  Symptomatic uterine fibroids with abnormal  uterine bleeding.  History of secondary severe anemia, iron deficiency.   PROCEDURE:  Total abdominal hysterectomy.   SURGEONS:  1. Roseanna Rainbow, MD  2. Charles A. Clearance Coots, MD   ANESTHESIA:  General endotracheal.   ESTIMATED BLOOD LOSS:  100 mL.   PHYSICAL FINDINGS:  At the time of exploratory laparotomy, the uterus  appeared globular and slightly enlarged.  The remainder of the pelvic  anatomy was normal appearing.   PROCEDURE IN DETAILS:  The patient was taken to the operating room.  She  was given general anesthesia and placed in a modified lithotomy position  with Allis stirrups.  The abdomen was prepped and draped in the usual  sterile fashion.  After a time-out had been completed, the abdomen was  entered through the previous transverse skin incision.  The abdomen and  pelvis were explored with the above findings noted.  An O'Connor-  O'Sullivan retractor was placed into the incision.  The bowel was packed  away with moist laparotomy sponges.  The uterus was grasped with 2 long  Kelly clamps.  The round ligaments were divided.  The utero-ovarian  ligament and fallopian tubes were clamped, cut.  These pedicles were  secured with free ligatures and suture ligatures.  The bladder flap was  then advanced using sharp and blunt dissection.  The uterine vessels  were skeletonized, clamped, cut and suture ligated.   In a stepwise  fashion, the cardinal ligaments were clamped, cut and suture ligated.  The uterine fundus was amputated with cautery above the level of the  uterine vascular pedicles.  The vaginal angles were cross-clamped and  the vagina was transected from the cervix.  The vaginal angles were  transfixed with 0 Vicryl sutures.  The remainder of the vaginal cuff was  closed with interrupted sutures of 0 Vicryl.  The pelvis was irrigated  and found to be hemostatic.  The retractor and packs were then removed  from the abdomen.  The parietal peritoneum was reapproximated in a  running fashion using a suture of 2-0 Monocryl.  The fascia was  reapproximated with running  sutures of 0 PDS, tied in the midline.  The skin was closed in a  subcuticular fashion using a suture of 3-0 Monocryl.  At the closure of  the procedure, the instrument and pack counts were said to be correct  x2.  The patient was taken to the PACU awake and in stable condition.      Roseanna Rainbow, M.D.  Electronically Signed     LAJ/MEDQ  D:  11/26/2008  T:  11/27/2008  Job:  [id number]

## 2010-09-24 NOTE — Discharge Summary (Signed)
[facility]  Patient:    [name], [name] Visit Number: [id number] MRN: [id number]          Service Type: OBS Location: 9300 [id number] Attending Physician:  Tammi Sou Dictated by:   Bradly Bienenstock, M.D. Admit Date:  03/22/2001 Discharge Date: 04/01/2001                             Discharge Summary  DATE OF BIRTH:                02-20-1976  DISCHARGE DIAGNOSES:          1. Second trimester fetal loss at 23-weeks                                  gestation.                               2. Premature preterm rupture of membranes                               3. Anemia, hemoglobin of 8.7 upon discharge.  DISCHARGE MEDICATIONS:        1. Ibuprofen 600 mg p.o. q.d.                               2. Iron 325 mg one pill b.i.d. x1 month.                               3. Colace 100 mg one pill b.i.d. x1 month.                               4. Methergine 0.2 one pill three times a day for                                  three days.                               5. Percocet 5/325 1-2 pills q.4-6h. p.r.n. pain.  DISCHARGE LABS:               Hemoglobin 8.7.  HISTORY OF PRESENT ILLNESS:   This is a 35 year old G3, P0-2-2, who presented to [facility] emergency department at 21 weeks, 4 days gestation with rupture of membranes and cramping.  Patient was admitted for preterm, premature rupture of membranes and was placed on broad-spectrum antibiotics and followed expectantly.  The patient was placed on gentamycin and clindamycin.  She was followed for several days, however, on March 30, 2001, patient did have an increase in cramping and pain and did deliver a nonviable 23-week female infant, who survived for approximately 30 minutes before expiring.  Patient had an uncomplicated post-delivery course.  Patient was offered grief counseling for the loss and she was discharged two days after delivery, in stable condition. Dictated by:   Bradly Bienenstock,  M.D. Attending Physician:  Tammi Sou DD:  04/01/01 TD:  04/01/01 Job: [id number]

## 2010-09-24 NOTE — Discharge Summary (Signed)
NAMEKRIMSON, MASSMANN               ACCOUNT NO.:  [id number]   MEDICAL RECORD NO.:  [id number]          PATIENT TYPE:  INP   LOCATION:  9131                          [facility]:  [facility]   PHYSICIAN:  Kathreen Cosier, M.D.DATE OF BIRTH:  16-Jul-1975   DATE OF ADMISSION:  08/07/2007  DATE OF DISCHARGE:  08/10/2007                               DISCHARGE SUMMARY   The patient is a 35 year old gravida 6, para 4-1-0-4, EDC August 04, 2007, admitted for induction, cervix 2-3 cm, 70% vertex, -3.  Membranes  ruptured artificially and fluid was clear.  IUPC inserted and she was  started on Pitocin.  She also had a history of herpes, which shows no  outbreaks during the pregnancy and was on Valtrex 500 daily.  The  patient subsequently developed persistent late decelerations and  underwent a repeat low transverse cesarean section.  She had a female,  Apgars 8 and 9, weighing 4 pounds 15 ounces.  She had IUGR.  Postop  hemoglobin is 7.4.  It had been reported she had some excessive bleeding  during the night of surgery, and she got methadone and Hemabate, and by  day 1, her bleeding was normal.  She did well and was discharged on the  third postoperative day, ambulatory, on a regular diet, on ferrous  sulfate twice a day, and Tylox for pain.   DISCHARGE DIAGNOSES:  Status post repeat low transverse cesarean section  at term for nonreassuring fetal heart rate tracing.           ______________________________  Kathreen Cosier, M.D.     BAM/MEDQ  D:  09/05/2007  T:  09/05/2007  Job:  [id number]

## 2010-09-24 NOTE — Discharge Summary (Signed)
[facility]  Patient:    [name], [name] Visit Number: [id number] MRN: [id number]          Service Type: EXP Location: ED Attending Physician:  Sandi Raveling Dictated by:   Marlinda Mike, C.N.M. Admit Date:  08/07/2001 Discharge Date: 08/07/2001                             Discharge Summary  DATE OF BIRTH:                Jun 29, 1975  ADMISSION DIAGNOSES:          1. Intrauterine pregnancy at 18 weeks 5 days.                               2. Abdominal pain.                               3. Preterm premature rupture of membranes.  DISCHARGE DIAGNOSES:          1. Intrauterine pregnancy at 19 weeks 1 day.                               2. Preterm premature rupture of membranes.                               3. Expectant management.                               4. Positive Gonorrhea, treated.  HISTORY OF PRESENT ILLNESS:   The patient is a 35 year old, gravida 3, para 0-2-0-2, with a last menstrual period of October 20, 2000, which makes current gestation of pregnancy 18 weeks 5 days.  She presents to maternity admissions unit complaining of cramping and abdominal pain with yellow discharge and gushes of fluid.  The patient has received prenatal care at [facility] maternity clinic x3 visits.  The patient has a history of spontaneous vaginal delivery x2 at 35 to [redacted] weeks gestation.  No medical history, no surgical history.  MEDICATIONS:                  1. Prenatal vitamins.                               2. Iron once a day.  ALLERGIES:                    No known drug allergies.  HOSPITAL COURSE:              The patient was admitted in maternity admissions unit with evaluation of presenting complaint.  Vital signs were stable with a temperature of 99.7 noted on examination.  Pelvic examination includes moderate yellow discharge and clear fluid leakage from cervical os.  Uterus size 18 to 20 weeks, nontender.  Gonorrhea and Chlamydia  cultures were taken. Wet prep revealed rare white blood cells.  urinalysis was within normal limits.  Nitrazine was positive, ferning was positive for confirmation of preterm premature rupture of membranes.  The patient underwent ultrasound during initial evaluation which showed  18 weeks 6 days with positive fetal heart tones in the 180s to 190s with a fluid pocket of less than 1 cm.  The patient was admitted to the hospital for a course of IV antibiotics and observation.  The patient received antibiotics of Unasyn 3 grams IV q.6h. and observation.  Cultures revealed positive gonorrhea and negative Chlamydia on admission examination.  The patient treated with Rocephin 250 mg intramuscularly x1 and Azithromycin 1 gram p.o.  Instructed for partner to be treated.  The patient discharged from the hospital on March 04, 2001, after consultation with Dr. Gavin Potters.  The patient to continue bed rest at home for expectant management of preterm premature rupture of membranes at a nonviable gestation.  The patient to return to maternity admissions unit on Friday, November 1, at 9:30 a.m.  To return to the hospital in the interim with any increase in pain, onset of cramping, further leakage of fluid, or any bleeding. The patient understands plan of care and accepts plan of care.  The patient is discharged from the hospital on March 04, 2002. Dictated by:   Marlinda Mike, C.N.M. Attending Physician:  Sandi Raveling DD:  08/18/01 TD:  08/20/01 Job: [id number]

## 2010-09-24 NOTE — Discharge Summary (Signed)
   [name], [name]                         ACCOUNT NO.:  [id number]   MEDICAL RECORD NO.:  [id number]                   PATIENT TYPE:  INP   LOCATION:  9120                                 [facility]ACILITY:  [facility]   PHYSICIAN:  Burnadette Peter, M.D.             DATE OF BIRTH:  [birth year]   DATE OF ADMISSION:  10/31/2002  DATE OF DISCHARGE:  11/03/2002                                 DISCHARGE SUMMARY   DISCHARGE DIAGNOSIS:  Low transverse cesarean section secondary to genital  herpes.   DISCHARGE MEDICATIONS:  1. Percocet 5/325 mg one p.o. q.6 h. p.r.n.  2. Ibuprofen 600 mg p.o. q.6 h. p.r.n.  3. Micronor one p.o. daily as directed.   DISCHARGE INSTRUCTIONS:  She was discharged with wound care, activity, and  sexual activity instructions and instructed to return to high-risk clinic in  six weeks for a postpartum checkup.   HOSPITAL COURSE:  The patient is a 35 year old P 1-2-0-2 who presented on  June 24th in an acute labor with herpes simplex.  After discussion the risks  of labor and vaginal delivery with her disease, she opted for a C-section.  The patient had a routine postoperative course and is discharged in good  condition.     [name] [name], M.D.                     Burnadette Peter, M.D.    SAG/MEDQ  D:  11/03/2002  T:  11/03/2002  Job:  [id number]

## 2010-09-24 NOTE — Discharge Summary (Signed)
[name], [name]               ACCOUNT NO.:  [id number]   MEDICAL RECORD NO.:  [id number]          PATIENT TYPE:  INP   LOCATION:  9317                          [facility]   PHYSICIAN:  Kathreen Cosier, M.D.DATE OF BIRTH:  12-26-1975   DATE OF ADMISSION:  08/12/2007  DATE OF DISCHARGE:  08/14/2007                               DISCHARGE SUMMARY   The patient is a 35 year old gravida 7, para 5-1-1-5.  She had a C-  section on August 07, 2007, was admitted with a headache and her blood  pressures were 149/89 to 172/84, hemoglobin 8.6, SGOT 63, SGPT 72, and  uric acid 3.8.  She was admitted and placed on magnesium sulfate 2 g per  hour.  She was started on labetalol 200 mg p.o. b.i.d. and her  diastolics came down into the 70s by August 14, 2007.  She was offered  magnesium and her blood pressures were normal.  She was discharged home  on labetalol 200 mg p.o. b.i.d. and to follow up with me in 1 week.   DISCHARGE DIAGNOSIS:  Postpartum pregnancy-induced hypertension.           ______________________________  Kathreen Cosier, M.D.     BAM/MEDQ  D:  09/05/2007  T:  09/05/2007  Job:  [id number]

## 2010-09-24 NOTE — Op Note (Signed)
   NAMESUNDUS, [name]                         ACCOUNT NO.:  [id number]   MEDICAL RECORD NO.:  [id number]                   PATIENT TYPE:  INP   LOCATION:  9120                                 [facility]ILITY[facility]   PHYSICIAN:  Phil D. Okey Dupre, M.D.                  DATE OF BIRTH:  11-11-1975   DATE OF PROCEDURE:  10/31/2002  DATE OF DISCHARGE:                                 OPERATIVE REPORT   PROCEDURE:  Low transverse cesarean section.   PREOPERATIVE DIAGNOSIS:  Active labor with active herpes simplex genitalis.   POSTOPERATIVE DIAGNOSIS:  Active labor with active herpes simplex genitalis.   PROCEDURE:  Under satisfactory spinal anesthesia with the patient in a  dorsal supine position, a Foley catheter in the urinary bladder, the abdomen  was prepped and draped in the usual sterile manner and entered through  Pfannenstiel incision starting 3 cm above the symphysis and extending for a  total length of 18 cm.  The abdomen was entered by layers.  On entering the  peritoneal cavity, the visceral peritoneum on the anterior surface of the  uterus opened transversely by sharp dissection, the bladder pushed away from  the lower uterine segment, was entered by sharp and blunt dissection.  From  an OP presentation, the baby was easily delivered, the cord doubly clamped,  divided, the baby handed to the pediatrician after being sucked with the  bulb suction, blood taken from the cord for analysis, and the placenta  manually removed, the uterus explored and closed with a continuous running  locked 0 Vicryl on an atraumatic needle.  The area was observed for  bleeding, none was noted, and the fascia was closed from the left hand of  the incision in a continuous running alternating locked 0 Vicryl to the  right hand of the incision. Subcutaneous bleeders were controlled with hot  cautery and the skin edges approximated with skin staples.  A dry sterile  dressing was applied.  The patient tolerated  the procedure well and was  transferred to the recovery room in satisfactory condition.  Total blood  loss about 800 mL.  Tape, instrument, sponge, and needle count were reported  correct at the end of the procedure.                                                Phil D. Okey Dupre, M.D.    PDR/MEDQ  D:  11/01/2002  T:  11/02/2002  Job:  [id number]

## 2010-09-24 NOTE — Consult Note (Signed)
NAMEKARYS, MECKLEY                         ACCOUNT NO.:  [id number]   MEDICAL RECORD NO.:  [id number]                   PATIENT TYPE:  EMS   LOCATION:  MINO                                 FACILITY:  [facility]   PHYSICIAN:  Hermelinda Medicus, M.D.                DATE OF BIRTH:  [birth year]   DATE OF CONSULTATION:  07/19/2003  DATE OF DISCHARGE:  07/19/2003                                   CONSULTATION   The patient came in as a 35 year old female who has three small children who  was concerned about a sore throat. He was concerned that she had tonsillitis  and was treating but wanted a check to see if she had any evidence of a  peritonsillar abscess. I am now seeing her at [facility] ER for this  purpose. She has been given at [facility] Decadron 8 and Ancef 1 g IV.  White count was 9.3. Electrolytes look to be in good condition, good status.  Hemoglobin 21.2. The patient has had a problem in the past with a  peritonsillar abscess at age 69 approximately seven years ago. She also had  other tonsillitis problems for which she was cultured three weeks ago with  strep and apparently was not treated at that time. She now enters with a  tonsillitis but no evidence of abscess. Ears are clear. Oral cavity shows  right tonsillar inflammation of very mild appearance. She is able to swallow  very well. She has no air way difficulties. Her neck is free of any  thyromegaly, cervical adenopathy, or mass except mild cervical tenderness at  the highest portion of her right neck. Her nose was clear. Nasopharynx is  clear. Larynx is clear. The patient will continue her antibiotics by mouth  and is to return and see me Wednesday, approximately four days from now for  further evaluation to make sure the antibiotic is working well. I then  advised her that considering she has got the history of tonsillitis and  peritonsillar abscess several years ago that she could more difficult  situation in the future and  that I recommend she have her tonsils removed in  approximately six weeks. The patient is agreeable to that, and we will be  following up in my office.                                               Hermelinda Medicus, M.D.    JC/MEDQ  D:  07/19/2003  T:  07/21/2003  Job:  [id number]

## 2011-01-01 ENCOUNTER — Emergency Department (HOSPITAL_COMMUNITY)
Admission: EM | Admit: 2011-01-01 | Discharge: 2011-01-02 | Disposition: A | Discharge status: Home / Self Care | Payer: Medicaid Other | Attending: Emergency Medicine | Admitting: Emergency Medicine

## 2011-01-01 DIAGNOSIS — L259 Unspecified contact dermatitis, unspecified cause: Secondary | ICD-10-CM | POA: Insufficient documentation

## 2011-01-01 DIAGNOSIS — B0089 Other herpesviral infection: Secondary | ICD-10-CM | POA: Insufficient documentation

## 2011-01-31 LAB — RPR: RPR Ser Ql: NONREACTIVE

## 2011-01-31 LAB — CBC
HCT: 32.8 — ABNORMAL LOW
Hemoglobin: 10.5 — ABNORMAL LOW
MCHC: 31.9
MCV: 70.9 — ABNORMAL LOW
Platelets: 277
RBC: 4.63
RDW: 20.4 — ABNORMAL HIGH
WBC: 9.6

## 2011-02-01 LAB — COMPREHENSIVE METABOLIC PANEL
ALT: 72 — ABNORMAL HIGH
AST: 45 — ABNORMAL HIGH
AST: 63 — ABNORMAL HIGH
Albumin: 2.1 — ABNORMAL LOW
Albumin: 2.1 — ABNORMAL LOW
BUN: 3 — ABNORMAL LOW
CO2: 26
CO2: 27
Calcium: 7.7 — ABNORMAL LOW
Chloride: 108
Creatinine, Ser: 0.62
GFR calc Af Amer: >60
GFR calc Af Amer: >60
GFR calc non Af Amer: >60
GFR calc non Af Amer: >60
Potassium: 4.1
Sodium: 140
Total Bilirubin: 0.3
Total Bilirubin: 0.4

## 2011-02-01 LAB — URINALYSIS, ROUTINE W REFLEX MICROSCOPIC
Hgb urine dipstick: NEGATIVE
Nitrite: NEGATIVE
Specific Gravity, Urine: 1.015
Urobilinogen, UA: 0.2

## 2011-02-01 LAB — CBC
HCT: 18.7 — ABNORMAL LOW
HCT: 22.4 — ABNORMAL LOW
Hemoglobin: 6.1 — CL
MCHC: 32.5
MCV: 70.6 — ABNORMAL LOW
MCV: 71.3 — ABNORMAL LOW
Platelets: 195
Platelets: 368
RBC: 2.71 — ABNORMAL LOW
RDW: 20.4 — ABNORMAL HIGH
RDW: 20.5 — ABNORMAL HIGH
WBC: 8.6

## 2011-02-01 LAB — LACTATE DEHYDROGENASE: LDH: 273 — ABNORMAL HIGH

## 2011-02-03 LAB — URINE MICROSCOPIC-ADD ON

## 2011-02-03 LAB — WET PREP, GENITAL: Yeast Wet Prep HPF POC: NONE SEEN

## 2011-02-03 LAB — URINALYSIS, ROUTINE W REFLEX MICROSCOPIC
Bilirubin Urine: NEGATIVE
Leukocytes, UA: NEGATIVE
Nitrite: NEGATIVE
Specific Gravity, Urine: >1.03 — ABNORMAL HIGH
pH: 6

## 2011-02-03 LAB — POCT PREGNANCY, URINE: Preg Test, Ur: NEGATIVE

## 2011-02-08 LAB — WET PREP, GENITAL

## 2011-02-08 LAB — DIFFERENTIAL
Eosinophils Absolute: 0.4
Eosinophils Relative: 6 — ABNORMAL HIGH
Lymphs Abs: 3.2
Monocytes Absolute: 0.3
Monocytes Relative: 4

## 2011-02-08 LAB — CBC
HCT: 31.1 — ABNORMAL LOW
Hemoglobin: 9.4 — ABNORMAL LOW
MCV: 63.9 — ABNORMAL LOW
RBC: 4.87
WBC: 8.1

## 2011-02-08 LAB — GC/CHLAMYDIA PROBE AMP, GENITAL
Chlamydia, DNA Probe: NEGATIVE
GC Probe Amp, Genital: NEGATIVE

## 2011-02-11 LAB — CBC
MCHC: 29.9 g/dL — ABNORMAL LOW (ref 30.0–36.0)
Platelets: 417 10*3/uL — ABNORMAL HIGH (ref 150–400)
RBC: 4.23 MIL/uL (ref 3.87–5.11)
WBC: 6.7 10*3/uL (ref 4.0–10.5)

## 2011-02-11 LAB — GC/CHLAMYDIA PROBE AMP, GENITAL: GC Probe Amp, Genital: NEGATIVE

## 2011-02-11 LAB — POCT PREGNANCY, URINE: Preg Test, Ur: NEGATIVE

## 2011-02-11 LAB — WET PREP, GENITAL
Trich, Wet Prep: NONE SEEN
Yeast Wet Prep HPF POC: NONE SEEN

## 2011-02-21 LAB — URINALYSIS, ROUTINE W REFLEX MICROSCOPIC
Glucose, UA: NEGATIVE
Hgb urine dipstick: NEGATIVE
Protein, ur: NEGATIVE
pH: 6

## 2011-02-21 LAB — CBC
Hemoglobin: 8.6 — ABNORMAL LOW
MCHC: 31
RBC: 4.4
WBC: 4.4

## 2011-02-21 LAB — HCG, QUANTITATIVE, PREGNANCY
hCG, Beta Chain, Quant, S: 1474 — ABNORMAL HIGH
hCG, Beta Chain, Quant, S: 311 — ABNORMAL HIGH

## 2011-02-21 LAB — POCT PREGNANCY, URINE: Preg Test, Ur: POSITIVE

## 2011-02-22 LAB — URINALYSIS, ROUTINE W REFLEX MICROSCOPIC
Bilirubin Urine: NEGATIVE
Leukocytes, UA: NEGATIVE
Nitrite: NEGATIVE
Specific Gravity, Urine: 1.025
Urobilinogen, UA: 0.2
pH: 6

## 2011-02-22 LAB — POCT PREGNANCY, URINE
Operator id: 134401
Preg Test, Ur: NEGATIVE

## 2011-02-22 LAB — WET PREP, GENITAL: Yeast Wet Prep HPF POC: NONE SEEN

## 2011-02-24 LAB — I-STAT 8, (EC8 V) (CONVERTED LAB)
Bicarbonate: 23.7
Glucose, Bld: 92
Sodium: 139
TCO2: 25
pH, Ven: 7.39 — ABNORMAL HIGH

## 2011-02-24 LAB — CBC
HCT: 30.6 — ABNORMAL LOW
Hemoglobin: 9.4 — ABNORMAL LOW
MCHC: 30.6
MCV: 64.4 — ABNORMAL LOW
RDW: 17 — ABNORMAL HIGH

## 2011-02-24 LAB — DIFFERENTIAL
Basophils Absolute: 0.1
Eosinophils Absolute: 0.2
Lymphs Abs: 2.2
Neutro Abs: 2.9

## 2011-02-24 LAB — POCT RAPID STREP A
Streptococcus, Group A Screen (Direct): NEGATIVE
Streptococcus, Group A Screen (Direct): NEGATIVE

## 2011-08-18 ENCOUNTER — Encounter (HOSPITAL_COMMUNITY): Payer: Self-pay | Admitting: Emergency Medicine

## 2011-08-18 ENCOUNTER — Emergency Department (HOSPITAL_COMMUNITY)
Admission: EM | Admit: 2011-08-18 | Discharge: 2011-08-18 | Disposition: A | Discharge status: Home / Self Care | Payer: Medicaid Other | Attending: Emergency Medicine | Admitting: Emergency Medicine

## 2011-08-18 DIAGNOSIS — T169XXA Foreign body in ear, unspecified ear, initial encounter: Secondary | ICD-10-CM

## 2011-08-18 DIAGNOSIS — IMO0002 Reserved for concepts with insufficient information to code with codable children: Secondary | ICD-10-CM | POA: Insufficient documentation

## 2011-08-18 MED ORDER — LIDOCAINE VISCOUS 2 % MT SOLN
OROMUCOSAL | Status: AC
  Administered 2011-08-18: 03:00:00 via OTIC
  Filled 2011-08-18: qty 15

## 2011-08-18 NOTE — ED Notes (Signed)
Pt voiced understanding to return for any signs/symptoms of infection, and/or increasing pain.

## 2011-08-18 NOTE — ED Notes (Signed)
Viscous lidocaine placed into right ear for pain control for patient.  Organism has stopped moving.  Part of organism did come out, in specimen container.

## 2011-08-18 NOTE — ED Notes (Addendum)
Patient states she felt something crawl into ear, ear pain when bug moves.  Right ear.  Unknown organism.

## 2011-08-18 NOTE — ED Provider Notes (Signed)
History     CSN: [id number]  Arrival date & time 08/18/11  0245   First MD Initiated Contact with Patient 08/18/11 0353      Chief Complaint  Patient presents with  . Foreign Body in Ear    (Consider location/radiation/quality/duration/timing/severity/associated sxs/prior treatment) HPI Comments: 36 y/o female with hx of acute onset of FB (insect) in the R ear that occurred just pta - she tried to fill the ear with water but the bug kept moving and she presented with mild to moderate distress.  No associated dizzyness, vertigo, nasuea or light headeness, and no tinnitus.    Patient is a 36 y.o. female presenting with foreign body in ear. The history is provided by the patient and a friend.  Foreign Body in Ear Pertinent negatives include no headaches.    History reviewed. No pertinent past medical history.  History reviewed. No pertinent past surgical history.  History reviewed. No pertinent family history.  History  Substance Use Topics  . Smoking status: Never Smoker   . Smokeless tobacco: Not on file  . Alcohol Use: No    OB History    Grav Para Term Preterm Abortions TAB SAB Ect Mult Living                  Review of Systems  HENT: Positive for ear pain.   Gastrointestinal: Negative for nausea and vomiting.  Neurological: Negative for dizziness, light-headedness and headaches.    Allergies  Review of patient's allergies indicates no known allergies.  Home Medications  No current outpatient prescriptions on file.  BP 158/108  Pulse 108  Temp(Src) 98.3 F (36.8 C) (Oral)  Resp 18  SpO2 97%  Physical Exam  Constitutional: She appears well-developed and well-nourished. No distress.  HENT:  Head: Normocephalic and atraumatic.       R TM with insect present, not moving.  L TM normal, OP clear  Eyes: Conjunctivae are normal. Right eye exhibits no discharge. Left eye exhibits no discharge. No scleral icterus.  Pulmonary/Chest: Effort normal.    Neurological: She is alert. Coordination normal.  Skin: Skin is warm and dry. No rash noted. She is not diaphoretic. No erythema.    ED Course  FOREIGN BODY REMOVAL Date/Time: 08/18/2011 4:15 AM Performed by: Eber Hong D Authorized by: Eber Hong D Consent: Verbal consent obtained. Risks and benefits: risks, benefits and alternatives were discussed Consent given by: patient Patient understanding: patient states understanding of the procedure being performed Patient identity confirmed: verbally with patient Time out: Immediately prior to procedure a "time out" was called to verify the correct patient, procedure, equipment, support staff and site/side marked as required. Body area: ear Location details: right ear Patient sedated: no Patient cooperative: yes Localization method: visualized Removal mechanism: alligator forceps Complexity: simple 1 objects recovered. Objects recovered: Insect Post-procedure assessment: foreign body removed Patient tolerance: Patient tolerated the procedure well with no immediate complications.   (including critical care time)  Labs Reviewed - No data to display No results found.   1. Foreign body in ear       MDM  Bug removed manually with alligator forceps - pt appears stable for d/c.        Vida Roller, MD 08/18/11 [phone]

## 2011-08-18 NOTE — ED Notes (Signed)
Pt states something crawled in ear tonight, feels it moving, states painful with object moves around.  Denies any other sx or pain elsewhere.

## 2011-09-22 ENCOUNTER — Inpatient Hospital Stay (HOSPITAL_COMMUNITY)
Admission: AD | Admit: 2011-09-22 | Discharge: 2011-09-22 | Disposition: A | Discharge status: Home / Self Care | Payer: Medicaid Other | Source: Ambulatory Visit | Attending: Obstetrics & Gynecology | Admitting: Obstetrics & Gynecology

## 2011-09-22 DIAGNOSIS — N949 Unspecified condition associated with female genital organs and menstrual cycle: Secondary | ICD-10-CM | POA: Insufficient documentation

## 2011-09-22 DIAGNOSIS — B3731 Acute candidiasis of vulva and vagina: Secondary | ICD-10-CM | POA: Insufficient documentation

## 2011-09-22 DIAGNOSIS — B373 Candidiasis of vulva and vagina: Secondary | ICD-10-CM

## 2011-09-22 LAB — WET PREP, GENITAL: Trich, Wet Prep: NONE SEEN

## 2011-09-22 MED ORDER — FLUCONAZOLE 150 MG PO TABS: 150.0000 mg | ORAL_TABLET | Freq: Once | ORAL | Status: AC

## 2011-09-22 NOTE — Discharge Instructions (Signed)
Candidal Vulvovaginitis Candidal vulvovaginitis is an infection of the vagina and vulva. The vulva is the skin around the opening of the vagina. This may cause itching and discomfort in and around the vagina.  HOME CARE  Only take medicine as told by your doctor.   Do not have sex (intercourse) until the infection is healed or as told by your doctor.   Practice safe sex.   Tell your sex partner about your infection.   Do not douche or use tampons.   Wear cotton underwear. Do not wear tight pants or panty hose.   Eat yogurt. This may help treat and prevent yeast infections.  GET HELP RIGHT AWAY IF:   You have a fever.   Your problems get worse during treatment or do not get better in 3 days.   You have discomfort, irritation, or itching in your vagina or vulva area.   You have pain after sex.   You start to get belly (abdominal) pain.  MAKE SURE YOU:  Understand these instructions.   Will watch your condition.   Will get help right away if you are not doing well or get worse.  Document Released: 07/22/2008 Document Revised: 04/14/2011 Document Reviewed: 07/22/2008 [employer] Patient Information 2012 [employer]. 

## 2011-09-22 NOTE — MAU Note (Signed)
Patient presents with vaginal discharge, itching onset x 2 day history of hysterectomy

## 2011-09-22 NOTE — MAU Provider Note (Signed)
History     CSN: [id number]  Arrival date & time 09/22/11  1720   None     Chief Complaint  Patient presents with  . Vaginal Discharge     HPI CLASSIE [name] is a 36 y.o. female who presents to MAU for vaginal discharge that started a few days ago. Vaginal itching and burning. Current sex partner x 7 months. Unprotected sex. Hysterectomy 3 years ago due to abnormal bleeding and hemorrhage. Blood transfusions due to hemorrhage. The history was provided by the patient.  No past medical history on file.  No past surgical history on file.  No family history on file.  History  Substance Use Topics  . Smoking status: Never Smoker   . Smokeless tobacco: Not on file  . Alcohol Use: No    OB History    Grav Para Term Preterm Abortions TAB SAB Ect Mult Living                  Review of Systems  Constitutional: Negative for fever, chills, diaphoresis and fatigue.  HENT: Negative for ear pain, congestion, sore throat, facial swelling, neck pain, neck stiffness, dental problem and sinus pressure.   Eyes: Negative for photophobia, pain and discharge.  Respiratory: Negative for cough, chest tightness and wheezing.   Gastrointestinal: Negative for nausea, vomiting, abdominal pain, diarrhea, constipation and abdominal distention.  Genitourinary: Positive for vaginal discharge. Negative for dysuria, frequency, flank pain and difficulty urinating.  Musculoskeletal: Negative for myalgias, back pain and gait problem.  Skin: Negative for color change and rash.  Neurological: Negative for dizziness, speech difficulty, weakness, light-headedness, numbness and headaches.  Psychiatric/Behavioral: Negative for confusion and agitation.    Allergies  Review of patient's allergies indicates no known allergies.  Home Medications  No current outpatient prescriptions on file.  BP 134/83  Pulse 91  Temp(Src) 98.1 F (36.7 C) (Oral)  Resp 16  Ht 5' 7.5" (1.715 m)  Wt 193 lb 12.8 oz (87.907  kg)  BMI 29.91 kg/m2  Physical Exam  Nursing note and vitals reviewed. Constitutional: She is oriented to person, place, and time. She appears well-developed and well-nourished. No distress.  HENT:  Head: Normocephalic.  Eyes: EOM are normal.  Neck: Neck supple.  Cardiovascular: Normal rate.   Pulmonary/Chest: Effort normal.  Abdominal: Soft. There is no tenderness.  Genitourinary:       External genitalia without lesions. Thick white cheesy discharge vaginal vault. Cervix absent, uterus absent. No adnexal tenderness.   Musculoskeletal: Normal range of motion.  Neurological: She is alert and oriented to person, place, and time. No cranial nerve deficit.  Skin: Skin is warm and dry.  Psychiatric: She has a normal mood and affect. Her behavior is normal. Judgment and thought content normal.    ED Course  Procedures Care turned over to Georges Mouse, CNM @ 17:55. Wet prep pending.  Results for orders placed during the hospital encounter of 09/22/11 (from the past 24 hour(s))  WET PREP, GENITAL     Status: Abnormal   Collection Time   09/22/11  5:47 PM      Component Value Range   Yeast Wet Prep HPF POC MODERATE (*) NONE SEEN    Trich, Wet Prep NONE SEEN  NONE SEEN    Clue Cells Wet Prep HPF POC NONE SEEN  NONE SEEN    WBC, Wet Prep HPF POC MODERATE (*) NONE SEEN      MDM    A/P: 36 y.o. female with vaginal  candidiasis Rx Diflucan F/U PRN

## 2011-10-11 ENCOUNTER — Encounter (HOSPITAL_COMMUNITY): Payer: Self-pay | Admitting: *Deleted

## 2011-10-11 ENCOUNTER — Inpatient Hospital Stay (HOSPITAL_COMMUNITY)
Admission: AD | Admit: 2011-10-11 | Discharge: 2011-10-11 | Disposition: A | Discharge status: Home / Self Care | Payer: Medicaid Other | Source: Ambulatory Visit | Attending: Obstetrics & Gynecology | Admitting: Obstetrics & Gynecology

## 2011-10-11 DIAGNOSIS — L293 Anogenital pruritus, unspecified: Secondary | ICD-10-CM | POA: Insufficient documentation

## 2011-10-11 DIAGNOSIS — B3731 Acute candidiasis of vulva and vagina: Secondary | ICD-10-CM

## 2011-10-11 DIAGNOSIS — B373 Candidiasis of vulva and vagina: Secondary | ICD-10-CM

## 2011-10-11 LAB — URINALYSIS, ROUTINE W REFLEX MICROSCOPIC
Glucose, UA: NEGATIVE mg/dL
Protein, ur: NEGATIVE mg/dL
pH: 7 (ref 5.0–8.0)

## 2011-10-11 LAB — URINE MICROSCOPIC-ADD ON

## 2011-10-11 LAB — WET PREP, GENITAL: Trich, Wet Prep: NONE SEEN

## 2011-10-11 MED ORDER — FLUCONAZOLE 150 MG PO TABS: 150.0000 mg | ORAL_TABLET | Freq: Once | ORAL | Status: AC

## 2011-10-11 MED ORDER — FLUCONAZOLE 150 MG PO TABS
150.0000 mg | ORAL_TABLET | Freq: Once | ORAL | Status: AC
  Administered 2011-10-11: 150 mg via ORAL
  Filled 2011-10-11: qty 1

## 2011-10-11 NOTE — MAU Provider Note (Signed)
History     CSN: [id number]  Arrival date & time 10/11/11  1457   None     No chief complaint on file.   HPI [name] is a 36 y.o. female who is not pregnant. She has had a hysterectomy. She was evaluated here 09/1611 and treated for a yeast infection. She was doing better until 2 days ago when she decided to use a douche. After that she began having more itching and vaginal discomfort. The history was provided by the patient.  Past Medical History  Diagnosis Date  . No pertinent past medical history     Past Surgical History  Procedure Date  . Abdominal hysterectomy   . Cesarean section     x 2    No family history on file.  History  Substance Use Topics  . Smoking status: Never Smoker   . Smokeless tobacco: Not on file  . Alcohol Use: No    OB History    Grav Para Term Preterm Abortions TAB SAB Ect Mult Living   7 5 4 1 2 1 1   5       Review of Systems  Constitutional: Negative for fever, chills, diaphoresis and fatigue.  HENT: Negative for ear pain, congestion, sore throat, facial swelling, neck pain, neck stiffness, dental problem and sinus pressure.   Eyes: Negative for photophobia, pain and discharge.  Respiratory: Negative for cough, chest tightness and wheezing.   Gastrointestinal: Negative for nausea, vomiting, abdominal pain, diarrhea, constipation and abdominal distention.  Genitourinary: Positive for vaginal discharge and vaginal pain. Negative for dysuria, frequency, flank pain and difficulty urinating.  Musculoskeletal: Negative for myalgias, back pain and gait problem.  Skin: Negative for color change and rash.  Neurological: Negative for dizziness, speech difficulty, weakness, light-headedness, numbness and headaches.  Psychiatric/Behavioral: Negative for confusion and agitation.    Allergies  Review of patient's allergies indicates no known allergies.  Home Medications  No current outpatient prescriptions on file.  BP 121/78  Pulse 86   Temp(Src) 98.2 F (36.8 C) (Oral)  Resp 18  Ht 5' 7.5" (1.715 m)  Physical Exam  Nursing note and vitals reviewed. Constitutional: She is oriented to person, place, and time. She appears well-developed and well-nourished. No distress.  HENT:  Head: Normocephalic.  Eyes: EOM are normal.  Neck: Neck supple.  Cardiovascular: Normal rate.   Pulmonary/Chest: Effort normal.  Abdominal: Soft. There is no tenderness.  Genitourinary:       External genitalia without lesions. Thick white cheesy discharge vaginal vault. Cervix absent. Normal cuff repair.   Musculoskeletal: Normal range of motion.  Neurological: She is alert and oriented to person, place, and time. No cranial nerve deficit.  Skin: Skin is warm and dry.  Psychiatric: She has a normal mood and affect. Her behavior is normal. Judgment and thought content normal.   Assessment:  Monilia vaginosis  Plan:  Diflucan 150 mg po now   Diflucan Rx to take in 2 days   Follow up with [facility]   Return as needed   Do no douche ED Course  Procedures MDM

## 2011-10-11 NOTE — MAU Note (Signed)
C/O white vaginal Discharge that started today. Pt reports she has douched on Sunday

## 2011-10-11 NOTE — Discharge Instructions (Signed)
Candidal Vulvovaginitis Candidal vulvovaginitis is an infection of the vagina and vulva. The vulva is the skin around the opening of the vagina. This may cause itching and discomfort in and around the vagina.  HOME CARE  Only take medicine as told by your doctor.   Do not have sex (intercourse) until the infection is healed or as told by your doctor.   Practice safe sex.   Tell your sex partner about your infection.   Do not douche or use tampons.   Wear cotton underwear. Do not wear tight pants or panty hose.   Eat yogurt. This may help treat and prevent yeast infections.  GET HELP RIGHT AWAY IF:   You have a fever.   Your problems get worse during treatment or do not get better in 3 days.   You have discomfort, irritation, or itching in your vagina or vulva area.   You have pain after sex.   You start to get belly (abdominal) pain.  MAKE SURE YOU:  Understand these instructions.   Will watch your condition.   Will get help right away if you are not doing well or get worse.  Document Released: 07/22/2008 Document Revised: 04/14/2011 Document Reviewed: 07/22/2008 [employer] Patient Information 2012 [employer]. 

## 2011-10-12 LAB — GC/CHLAMYDIA PROBE AMP, URINE: GC Probe Amp, Urine: NEGATIVE

## 2012-02-17 ENCOUNTER — Encounter (HOSPITAL_COMMUNITY): Payer: Self-pay | Admitting: *Deleted

## 2012-02-17 ENCOUNTER — Inpatient Hospital Stay (HOSPITAL_COMMUNITY)
Admission: AD | Admit: 2012-02-17 | Discharge: 2012-02-17 | Disposition: A | Discharge status: Home / Self Care | Payer: Medicaid Other | Source: Ambulatory Visit | Attending: Family Medicine | Admitting: Family Medicine

## 2012-02-17 DIAGNOSIS — N949 Unspecified condition associated with female genital organs and menstrual cycle: Secondary | ICD-10-CM | POA: Insufficient documentation

## 2012-02-17 DIAGNOSIS — N898 Other specified noninflammatory disorders of vagina: Secondary | ICD-10-CM

## 2012-02-17 HISTORY — DX: Leiomyoma of uterus, unspecified: D25.9

## 2012-02-17 LAB — WET PREP, GENITAL: Yeast Wet Prep HPF POC: NONE SEEN

## 2012-02-17 MED ORDER — FLUCONAZOLE 150 MG PO TABS
150.0000 mg | ORAL_TABLET | Freq: Once | ORAL | Status: AC
  Administered 2012-02-17: 150 mg via ORAL
  Filled 2012-02-17: qty 1

## 2012-02-17 NOTE — MAU Provider Note (Signed)
  History     CSN: [id number]  Arrival date and time: 02/17/12 1238   First Provider Initiated Contact with Patient 02/17/12 1321      Chief Complaint  Patient presents with  . Vaginal Discharge   HPI  Pt here with report of dark green discharge noticed this morning.  No odor reported. New sexual partner 5 mos ago.    Past Medical History  Diagnosis Date  . Uterine fibroid     Past Surgical History  Procedure Date  . Cesarean section     x 2  . Abdominal hysterectomy     2010    History reviewed. No pertinent family history.  History  Substance Use Topics  . Smoking status: Never Smoker   . Smokeless tobacco: Not on file  . Alcohol Use: No    Allergies: No Known Allergies  No prescriptions prior to admission    Review of Systems  Genitourinary:       Vaginal discharge  All other systems reviewed and are negative.   Physical Exam   Blood pressure 123/80, pulse 83, temperature 98.3 F (36.8 C), temperature source Oral, resp. rate 16, height 5' 6.25" (1.683 m), weight 89.994 kg (198 lb 6.4 oz), SpO2 100.00%.  Physical Exam  Constitutional: She is oriented to person, place, and time. She appears well-developed and well-nourished. No distress.  HENT:  Head: Normocephalic.  Neck: Normal range of motion. Neck supple.  Cardiovascular: Normal rate, regular rhythm and normal heart sounds.   Respiratory: Effort normal and breath sounds normal. No respiratory distress.  GI: Soft. There is no tenderness. There is no CVA tenderness.  Genitourinary: No bleeding around the vagina. Vaginal discharge (white, thick, curd-like) found.  Musculoskeletal: Normal range of motion.  Neurological: She is alert and oriented to person, place, and time.  Skin: Skin is warm and dry.  Psychiatric: She has a normal mood and affect.    MAU Course  Procedures  Results for orders placed during the hospital encounter of 02/17/12 (from the past 24 hour(s))  WET PREP, GENITAL      Status: Abnormal   Collection Time   02/17/12  1:52 PM      Component Value Range   Yeast Wet Prep HPF POC NONE SEEN  NONE SEEN   Trich, Wet Prep NONE SEEN  NONE SEEN   Clue Cells Wet Prep HPF POC NONE SEEN  NONE SEEN   WBC, Wet Prep HPF POC MANY (*) NONE SEEN     Assessment and Plan  Vaginal Discharge  Plan: Diflucan 150mg  PO in MAU GC/CT pending Follow-up prn  [facility] 02/17/2012, 1:23 PM

## 2012-02-17 NOTE — MAU Note (Signed)
Patient states she has had a yellow/greenish vaginal discharge since this am.

## 2012-02-20 NOTE — MAU Provider Note (Signed)
Chart reviewed and agree with management and plan.  

## 2012-06-05 ENCOUNTER — Encounter (HOSPITAL_COMMUNITY): Payer: Self-pay | Admitting: Emergency Medicine

## 2012-06-05 ENCOUNTER — Emergency Department (HOSPITAL_COMMUNITY)
Admission: EM | Admit: 2012-06-05 | Discharge: 2012-06-05 | Disposition: A | Discharge status: Home / Self Care | Payer: Self-pay | Attending: Emergency Medicine | Admitting: Emergency Medicine

## 2012-06-05 DIAGNOSIS — G44209 Tension-type headache, unspecified, not intractable: Secondary | ICD-10-CM | POA: Insufficient documentation

## 2012-06-05 DIAGNOSIS — Z8742 Personal history of other diseases of the female genital tract: Secondary | ICD-10-CM | POA: Insufficient documentation

## 2012-06-05 MED ORDER — OXYCODONE-ACETAMINOPHEN 5-325 MG PO TABS: 2.0000 | ORAL_TABLET | Freq: Once | ORAL | Status: DC

## 2012-06-05 MED ORDER — OXYCODONE-ACETAMINOPHEN 5-325 MG PO TABS
1.0000 | ORAL_TABLET | Freq: Four times a day (QID) | ORAL | Status: DC | PRN
Start: 2012-06-05 — End: 2012-06-13

## 2012-06-05 MED ORDER — KETOROLAC TROMETHAMINE 60 MG/2ML IM SOLN
60.0000 mg | Freq: Once | INTRAMUSCULAR | Status: AC
  Administered 2012-06-05: 60 mg via INTRAMUSCULAR
  Filled 2012-06-05: qty 2

## 2012-06-05 MED ORDER — DEXAMETHASONE SODIUM PHOSPHATE 10 MG/ML IJ SOLN
10.0000 mg | Freq: Once | INTRAMUSCULAR | Status: AC
  Administered 2012-06-05: 10 mg via INTRAMUSCULAR
  Filled 2012-06-05: qty 1

## 2012-06-05 NOTE — ED Provider Notes (Signed)
Medical screening examination/treatment/procedure(s) were performed by non-physician practitioner and as supervising physician I was immediately available for consultation/collaboration.  Juliet Rude. Rubin Payor, MD 06/05/12 2242

## 2012-06-05 NOTE — ED Notes (Signed)
Pt states she has had a headache for the past two months  Pt states the pain comes and goes  Pt states sometimes the pain feels like her head is burning  Pt states she has had four headaches today  Pt states she has used excedrin migraine without relief  Denies any other sxs other than pain

## 2012-06-05 NOTE — ED Provider Notes (Signed)
History     CSN: [id number]  Arrival date & time 06/05/12  2015   First MD Initiated Contact with Patient 06/05/12 2025      Chief Complaint  Patient presents with  . Headache    (Consider location/radiation/quality/duration/timing/severity/associated sxs/prior treatment) HPI Comments: 37 year old female presents to the emergency department complaining of intermittent headaches for the past 2 months. Nothing in specific makes the headaches come on and states she has a different amount of headaches each day. Today she had 4 headaches. Currently pain located in the left side of her head, but patient states she believes it is because she is laying on her right side. She describes the pain as sharp and throbbing, rated 8/10. She has tried using Excedrin Migraine without relief and states she had one left over hydrocodone at home without much relief. Nothing in specific makes the pain worse. No history of migraines. Denies nausea, vomiting, visual disturbance, photophobia, fever or chills. Denies being under increased stress at this time.  Patient is a 37 y.o. female presenting with headaches. The history is provided by the patient.  Headache  Pertinent negatives include no fever, no shortness of breath, no nausea and no vomiting.    Past Medical History  Diagnosis Date  . Uterine fibroid     Past Surgical History  Procedure Date  . Cesarean section     x 2  . Abdominal hysterectomy     2010    History reviewed. No pertinent family history.  History  Substance Use Topics  . Smoking status: Never Smoker   . Smokeless tobacco: Not on file  . Alcohol Use: No    OB History    Grav Para Term Preterm Abortions TAB SAB Ect Mult Living   7 5 4 1 2 1 1   5       Review of Systems  Constitutional: Negative for fever, chills, activity change and appetite change.  HENT: Negative for neck pain and neck stiffness.   Eyes: Negative for photophobia and visual disturbance.    Respiratory: Negative for shortness of breath.   Cardiovascular: Negative for chest pain.  Gastrointestinal: Negative for nausea and vomiting.  Musculoskeletal: Negative for back pain.  Skin: Negative for rash.  Neurological: Positive for headaches. Negative for dizziness, syncope, weakness, light-headedness and numbness.  Psychiatric/Behavioral: Negative for confusion.  All other systems reviewed and are negative.    Allergies  Review of patient's allergies indicates no known allergies.  Home Medications  No current outpatient prescriptions on file.  BP 159/86  Pulse 85  Temp 97.5 F (36.4 C) (Oral)  Resp 20  SpO2 97%  Physical Exam  Vitals reviewed. Constitutional: She is oriented to person, place, and time. She appears well-developed and well-nourished. No distress.  HENT:  Head: Normocephalic and atraumatic.  Mouth/Throat: Oropharynx is clear and moist.  Eyes: Conjunctivae normal and EOM are normal. Pupils are equal, round, and reactive to light.  Neck: Normal range of motion. Neck supple. Muscular tenderness (bilateral paracervical muscles) present. No spinous process tenderness present. No Brudzinski's sign and no Kernig's sign noted.  Cardiovascular: Normal rate, regular rhythm, normal heart sounds and intact distal pulses.   Pulmonary/Chest: Effort normal and breath sounds normal. No respiratory distress.  Abdominal: Soft. Bowel sounds are normal. There is no tenderness.  Musculoskeletal: Normal range of motion. She exhibits no edema.  Lymphadenopathy:    She has no cervical adenopathy.  Neurological: She is alert and oriented to person, place, and time. She has  normal strength. No cranial nerve deficit or sensory deficit. Coordination and gait normal.  Skin: Skin is warm and dry. No rash noted.  Psychiatric: She has a normal mood and affect. Her speech is normal and behavior is normal.    ED Course  Procedures (including critical care time)  Labs Reviewed - No  data to display No results found.   1. Tension headache       MDM  37 y/o female with tension headache. Patient was talking on her phone sitting up with light on in NAD when I initially entered the room. PE unremarkable other than paracervical tenderness. Neuro exam unremarkable. No red flags concerning patient's headache. No rash, fever, chills. No relief with toradol and decadron. Patient is driving home and cannot receive narcotics. I suggested cool compresses to her forehead and heat to the back of her head. She feels as if she can go home and do this. Rx percocet #10. Return precautions discussed. Patient states understanding of plan and is agreeable.         Trevor Mace, PA-C 06/05/12 2149

## 2012-06-13 ENCOUNTER — Encounter (HOSPITAL_COMMUNITY): Payer: Self-pay | Admitting: *Deleted

## 2012-06-13 ENCOUNTER — Emergency Department (HOSPITAL_COMMUNITY): Payer: No Typology Code available for payment source

## 2012-06-13 ENCOUNTER — Emergency Department (HOSPITAL_COMMUNITY)
Admission: EM | Admit: 2012-06-13 | Discharge: 2012-06-13 | Disposition: A | Discharge status: Home / Self Care | Payer: No Typology Code available for payment source | Attending: Emergency Medicine | Admitting: Emergency Medicine

## 2012-06-13 DIAGNOSIS — Y939 Activity, unspecified: Secondary | ICD-10-CM | POA: Insufficient documentation

## 2012-06-13 DIAGNOSIS — Y929 Unspecified place or not applicable: Secondary | ICD-10-CM | POA: Insufficient documentation

## 2012-06-13 DIAGNOSIS — R209 Unspecified disturbances of skin sensation: Secondary | ICD-10-CM | POA: Insufficient documentation

## 2012-06-13 DIAGNOSIS — Z8742 Personal history of other diseases of the female genital tract: Secondary | ICD-10-CM | POA: Insufficient documentation

## 2012-06-13 DIAGNOSIS — M25559 Pain in unspecified hip: Secondary | ICD-10-CM

## 2012-06-13 DIAGNOSIS — S8990XA Unspecified injury of unspecified lower leg, initial encounter: Secondary | ICD-10-CM | POA: Insufficient documentation

## 2012-06-13 DIAGNOSIS — M2550 Pain in unspecified joint: Secondary | ICD-10-CM | POA: Insufficient documentation

## 2012-06-13 DIAGNOSIS — S79919A Unspecified injury of unspecified hip, initial encounter: Secondary | ICD-10-CM | POA: Insufficient documentation
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
## 2012-06-13 MED ORDER — IBUPROFEN 600 MG PO TABS: 600.0000 mg | ORAL_TABLET | Freq: Four times a day (QID) | ORAL | Status: DC | PRN

## 2012-06-13 MED ORDER — IBUPROFEN 200 MG PO TABS
600.0000 mg | ORAL_TABLET | Freq: Once | ORAL | Status: AC
  Administered 2012-06-13: 600 mg via ORAL
  Filled 2012-06-13: qty 3

## 2012-06-13 MED ORDER — METHOCARBAMOL 500 MG PO TABS: 1000.0000 mg | ORAL_TABLET | Freq: Four times a day (QID) | ORAL | Status: DC
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
## 2012-06-13 NOTE — ED Notes (Signed)
rx x 2 for robaxin and ibuprofen given at d/c- ice pack provided for home use

## 2012-06-13 NOTE — ED Provider Notes (Signed)
Medical screening examination/treatment/procedure(s) were performed by non-physician practitioner and as supervising physician I was immediately available for consultation/collaboration.   Naimah Yingst R Helmut Hennon, MD 06/13/12 2300 

## 2012-06-13 NOTE — ED Notes (Signed)
Patient transported to X-ray 
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
## 2012-06-13 NOTE — ED Notes (Signed)
EDP Josh at bedside.
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
## 2013-02-15 ENCOUNTER — Encounter (HOSPITAL_COMMUNITY): Payer: Self-pay | Admitting: Emergency Medicine

## 2013-02-15 ENCOUNTER — Emergency Department (HOSPITAL_COMMUNITY)
Admission: EM | Admit: 2013-02-15 | Discharge: 2013-02-15 | Disposition: A | Discharge status: Home / Self Care | Payer: Medicaid Other | Source: Home / Self Care | Attending: Family Medicine | Admitting: Family Medicine

## 2013-02-15 ENCOUNTER — Ambulatory Visit (HOSPITAL_COMMUNITY): Payer: Medicaid Other | Attending: Emergency Medicine

## 2013-02-15 DIAGNOSIS — IMO0002 Reserved for concepts with insufficient information to code with codable children: Secondary | ICD-10-CM | POA: Insufficient documentation

## 2013-02-15 DIAGNOSIS — S62642A Nondisplaced fracture of proximal phalanx of right middle finger, initial encounter for closed fracture: Secondary | ICD-10-CM

## 2013-02-15 DIAGNOSIS — W19XXXA Unspecified fall, initial encounter: Secondary | ICD-10-CM | POA: Insufficient documentation

## 2013-02-15 MED ORDER — HYDROCODONE-ACETAMINOPHEN 5-325 MG PO TABS: 1.0000 | ORAL_TABLET | Freq: Four times a day (QID) | ORAL | Status: DC | PRN

## 2013-02-15 NOTE — Progress Notes (Signed)
Orthopedic Tech Progress Note Patient Details:  [name] Jun 30, 1975 [id number]  Ortho Devices Type of Ortho Device: Ace wrap;Volar splint Ortho Device/Splint Location: RUE Ortho Device/Splint Interventions: Ordered;Application   [name] 02/15/2013, 2:36 PM

## 2013-02-15 NOTE — ED Notes (Signed)
Pt assessed and triaged by provider.   Provider in before nurse.  
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
## 2013-02-16 ENCOUNTER — Telehealth (HOSPITAL_COMMUNITY): Payer: Self-pay | Admitting: *Deleted

## 2013-02-16 NOTE — ED Notes (Signed)
Pt. called on VM @1316  and said she is taking Vicodin for fx. finger and has developed a rash on her face and throat is itching.  I called pt. back and told her to stop the Vicodin and use Tylenol or Ibuprofen.  She said she is doing that but it is not relieving her pain.  I told her that all the providers have left for the day. We would not be able to call in a Rx. of narcotic by phone. I told her to come in the morning and they might write another Rx. for her.  She can use Benadryl 25- 50 mg. every 6 hrs for the rash and itching. She said she put her eczema cream on her face. I told her I did not think that it supposed to be used on the face. She can ask the pharmacist if Hydrocortisone 1% is OK. Vassie Moselle 02/16/2013

## 2013-02-19 NOTE — ED Provider Notes (Signed)
Medical screening examination/treatment/procedure(s) were performed by a resident physician or non-physician practitioner and as the supervising physician I was immediately available for consultation/collaboration.  Emmalyne Giacomo, MD    Burnell Matlin S Rina Adney, MD 02/19/13 0746 

## 2013-05-17 ENCOUNTER — Emergency Department (INDEPENDENT_AMBULATORY_CARE_PROVIDER_SITE_OTHER)
Admission: EM | Admit: 2013-05-17 | Discharge: 2013-05-17 | Disposition: A | Discharge status: Home / Self Care | Payer: Managed Care, Other (non HMO) | Source: Home / Self Care

## 2013-05-17 ENCOUNTER — Encounter (HOSPITAL_COMMUNITY): Payer: Self-pay | Admitting: Emergency Medicine

## 2013-05-17 DIAGNOSIS — Z23 Encounter for immunization: Secondary | ICD-10-CM

## 2013-05-17 DIAGNOSIS — T2114XA Burn of first degree of lower back, initial encounter: Secondary | ICD-10-CM

## 2013-05-17 MED ORDER — HYDROCODONE-ACETAMINOPHEN 5-325 MG PO TABS: 1.0000 | ORAL_TABLET | ORAL | Status: DC | PRN

## 2013-05-17 MED ORDER — KETOROLAC TROMETHAMINE 60 MG/2ML IM SOLN
INTRAMUSCULAR | Status: AC
  Filled 2013-05-17: qty 2

## 2013-05-17 MED ORDER — SILVER SULFADIAZINE 1 % EX CREA: 1.0000 "application " | TOPICAL_CREAM | Freq: Every day | CUTANEOUS | Status: DC

## 2013-05-17 MED ORDER — TETANUS-DIPHTH-ACELL PERTUSSIS 5-2.5-18.5 LF-MCG/0.5 IM SUSP
INTRAMUSCULAR | Status: AC
  Filled 2013-05-17: qty 0.5

## 2013-05-17 MED ORDER — KETOROLAC TROMETHAMINE 60 MG/2ML IM SOLN
60.0000 mg | Freq: Once | INTRAMUSCULAR | Status: AC
Start: 2013-05-17 — End: 2013-05-17
  Administered 2013-05-17: 60 mg via INTRAMUSCULAR

## 2013-05-17 MED ORDER — TETANUS-DIPHTH-ACELL PERTUSSIS 5-2.5-18.5 LF-MCG/0.5 IM SUSP
0.5000 mL | Freq: Once | INTRAMUSCULAR | Status: AC
  Administered 2013-05-17: 0.5 mL via INTRAMUSCULAR

## 2013-05-17 MED ORDER — SILVER SULFADIAZINE 1 % EX CREA
TOPICAL_CREAM | Freq: Once | CUTANEOUS | Status: AC
  Administered 2013-05-17: 11:00:00 via TOPICAL

## 2013-05-17 MED ORDER — IBUPROFEN 800 MG PO TABS: 800.0000 mg | ORAL_TABLET | Freq: Three times a day (TID) | ORAL | Status: DC
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
## 2013-05-17 NOTE — Discharge Instructions (Signed)

## 2013-05-20 NOTE — ED Provider Notes (Signed)
Medical screening examination/treatment/procedure(s) were performed by resident physician or non-physician practitioner and as supervising physician I was immediately available for consultation/collaboration.   Pauline Good MD.   Billy Fischer, MD 05/20/13 [phone]

## 2013-08-27 IMAGING — CR DG KNEE COMPLETE 4+V*L*
5 series · 5 of 5 positions shown · non-contrast
Comparison: None.

CLINICAL DATA: fall

EXAM:
LEFT KNEE - COMPLETE 4+ VIEW

[t knee lat left]
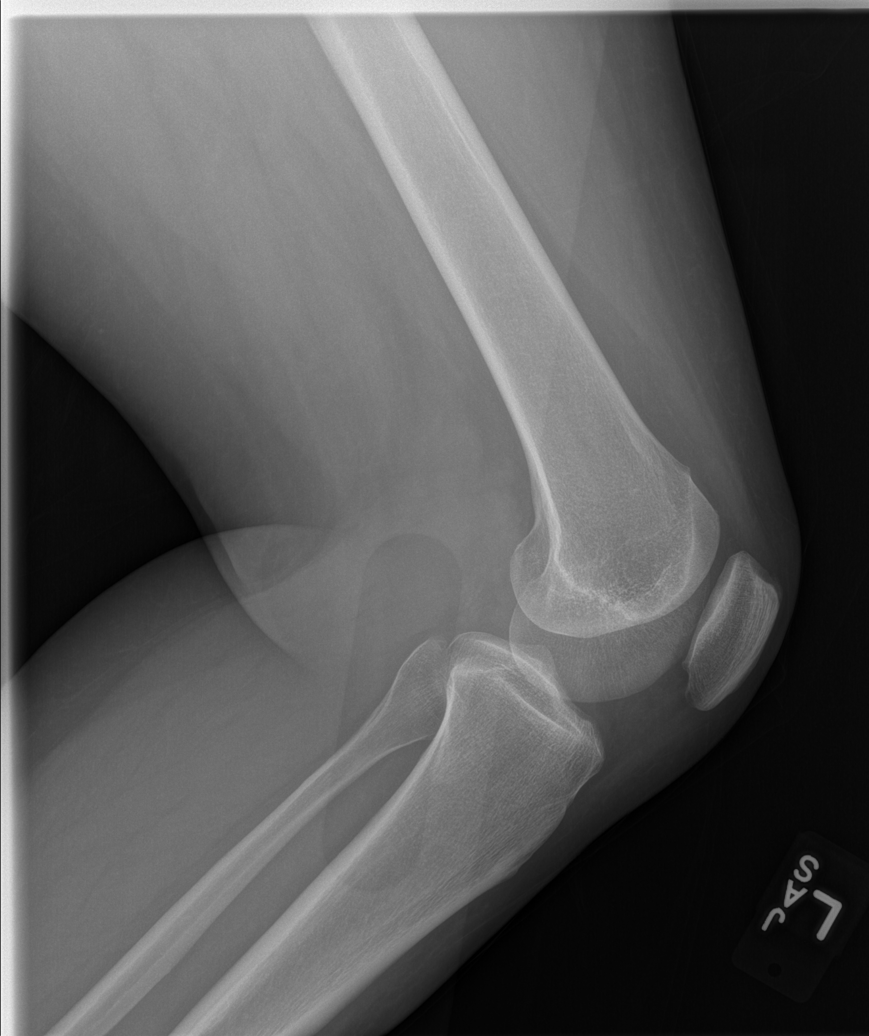

[t knee obl left (1 of 2)]
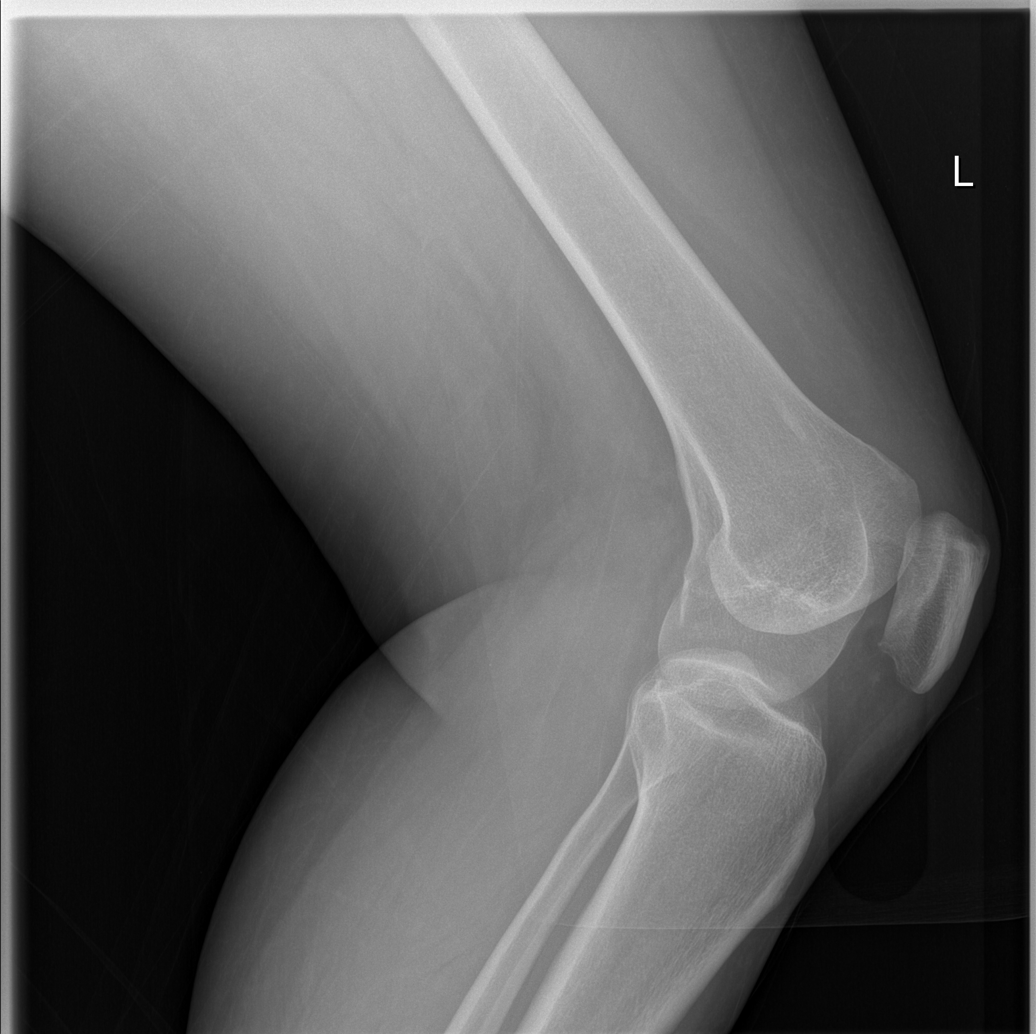

[t knee ap left (1 of 2)]
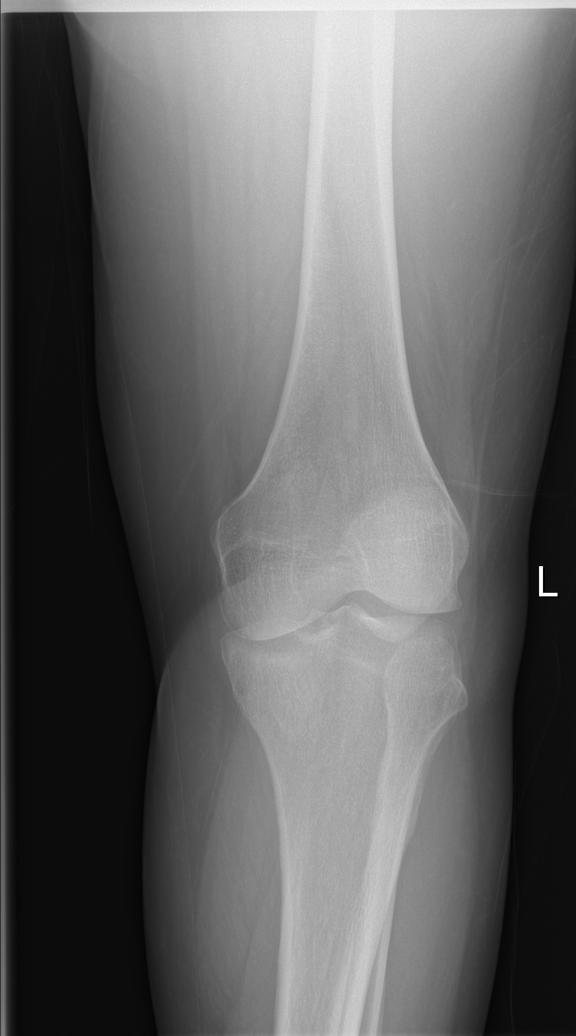

[t knee ap left (2 of 2)]
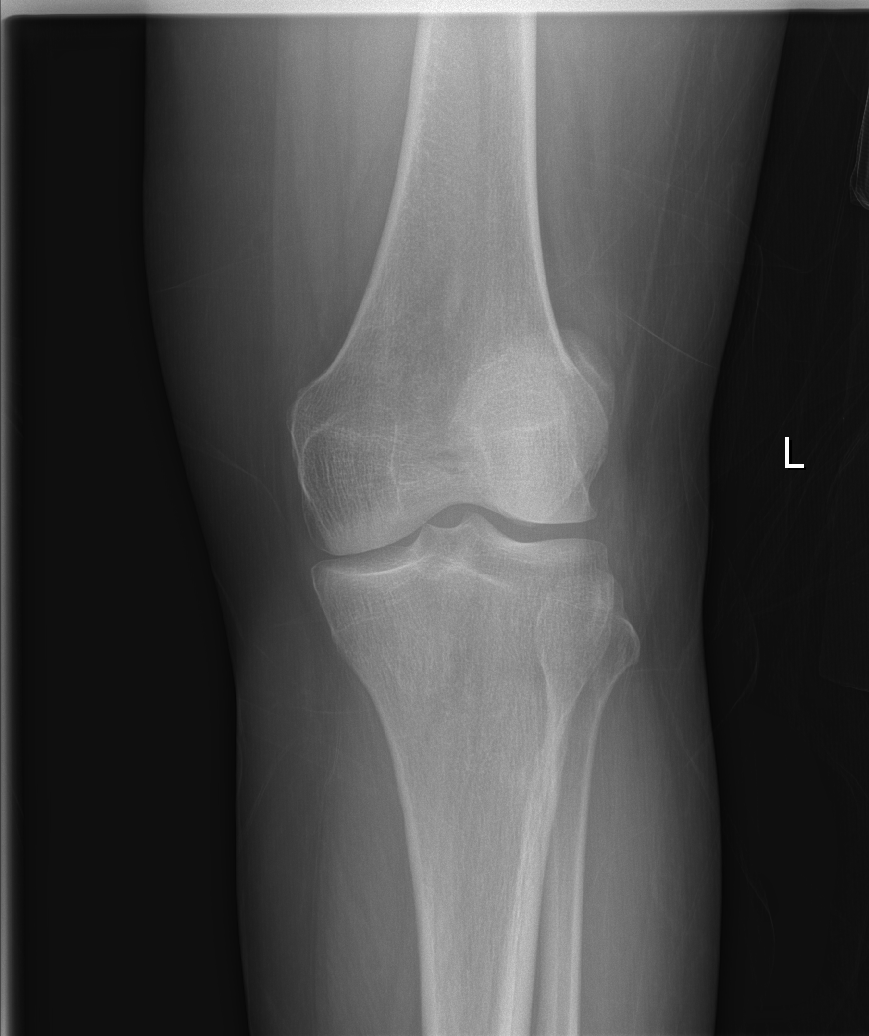

[t knee obl left (2 of 2)]
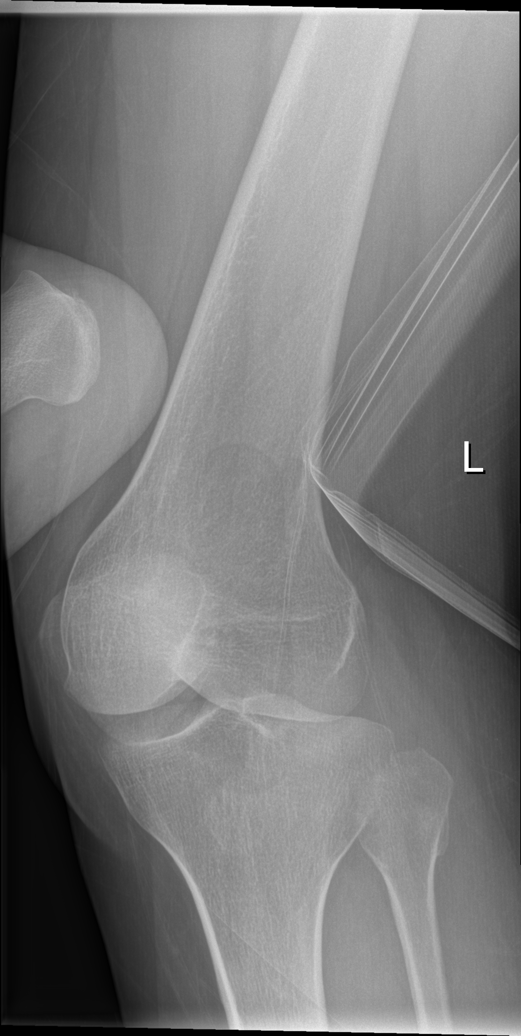

[5 of 5 positions shown; findings below may reference images not displayed]

FINDINGS: There is no evidence of fracture, dislocation, or joint effusion.
There is no evidence of arthropathy or other focal bone abnormality.
Soft tissues are unremarkable.
IMPRESSION: Negative.

## 2013-09-13 ENCOUNTER — Emergency Department (HOSPITAL_COMMUNITY)
Admission: EM | Admit: 2013-09-13 | Discharge: 2013-09-13 | Disposition: A | Discharge status: Home / Self Care | Payer: Managed Care, Other (non HMO) | Attending: Emergency Medicine | Admitting: Emergency Medicine

## 2013-09-13 ENCOUNTER — Emergency Department (HOSPITAL_COMMUNITY): Payer: Managed Care, Other (non HMO)

## 2013-09-13 ENCOUNTER — Encounter (HOSPITAL_COMMUNITY): Payer: Self-pay | Admitting: Emergency Medicine

## 2013-09-13 DIAGNOSIS — Y9389 Activity, other specified: Secondary | ICD-10-CM | POA: Insufficient documentation

## 2013-09-13 DIAGNOSIS — X500XXA Overexertion from strenuous movement or load, initial encounter: Secondary | ICD-10-CM | POA: Insufficient documentation

## 2013-09-13 DIAGNOSIS — Z87898 Personal history of other specified conditions: Secondary | ICD-10-CM | POA: Insufficient documentation

## 2013-09-13 DIAGNOSIS — Y92009 Unspecified place in unspecified non-institutional (private) residence as the place of occurrence of the external cause: Secondary | ICD-10-CM | POA: Insufficient documentation

## 2013-09-13 DIAGNOSIS — S81809A Unspecified open wound, unspecified lower leg, initial encounter: Principal | ICD-10-CM

## 2013-09-13 DIAGNOSIS — S91009A Unspecified open wound, unspecified ankle, initial encounter: Principal | ICD-10-CM

## 2013-09-13 DIAGNOSIS — R609 Edema, unspecified: Secondary | ICD-10-CM | POA: Insufficient documentation

## 2013-09-13 DIAGNOSIS — M25562 Pain in left knee: Secondary | ICD-10-CM

## 2013-09-13 DIAGNOSIS — Z79899 Other long term (current) drug therapy: Secondary | ICD-10-CM | POA: Insufficient documentation

## 2013-09-13 DIAGNOSIS — S81009A Unspecified open wound, unspecified knee, initial encounter: Secondary | ICD-10-CM | POA: Insufficient documentation

## 2013-09-13 DIAGNOSIS — IMO0002 Reserved for concepts with insufficient information to code with codable children: Secondary | ICD-10-CM | POA: Insufficient documentation

## 2013-09-13 DIAGNOSIS — W010XXA Fall on same level from slipping, tripping and stumbling without subsequent striking against object, initial encounter: Secondary | ICD-10-CM | POA: Insufficient documentation

## 2013-09-13 IMAGING — CR DG HIP (WITH OR WITHOUT PELVIS) 2-3V*L*
3 series · 3 of 3 positions shown · non-contrast
Comparison: None.

CLINICAL DATA: FALL

EXAM:
LEFT HIP - COMPLETE 2+ VIEW

[t hip frog leg left]
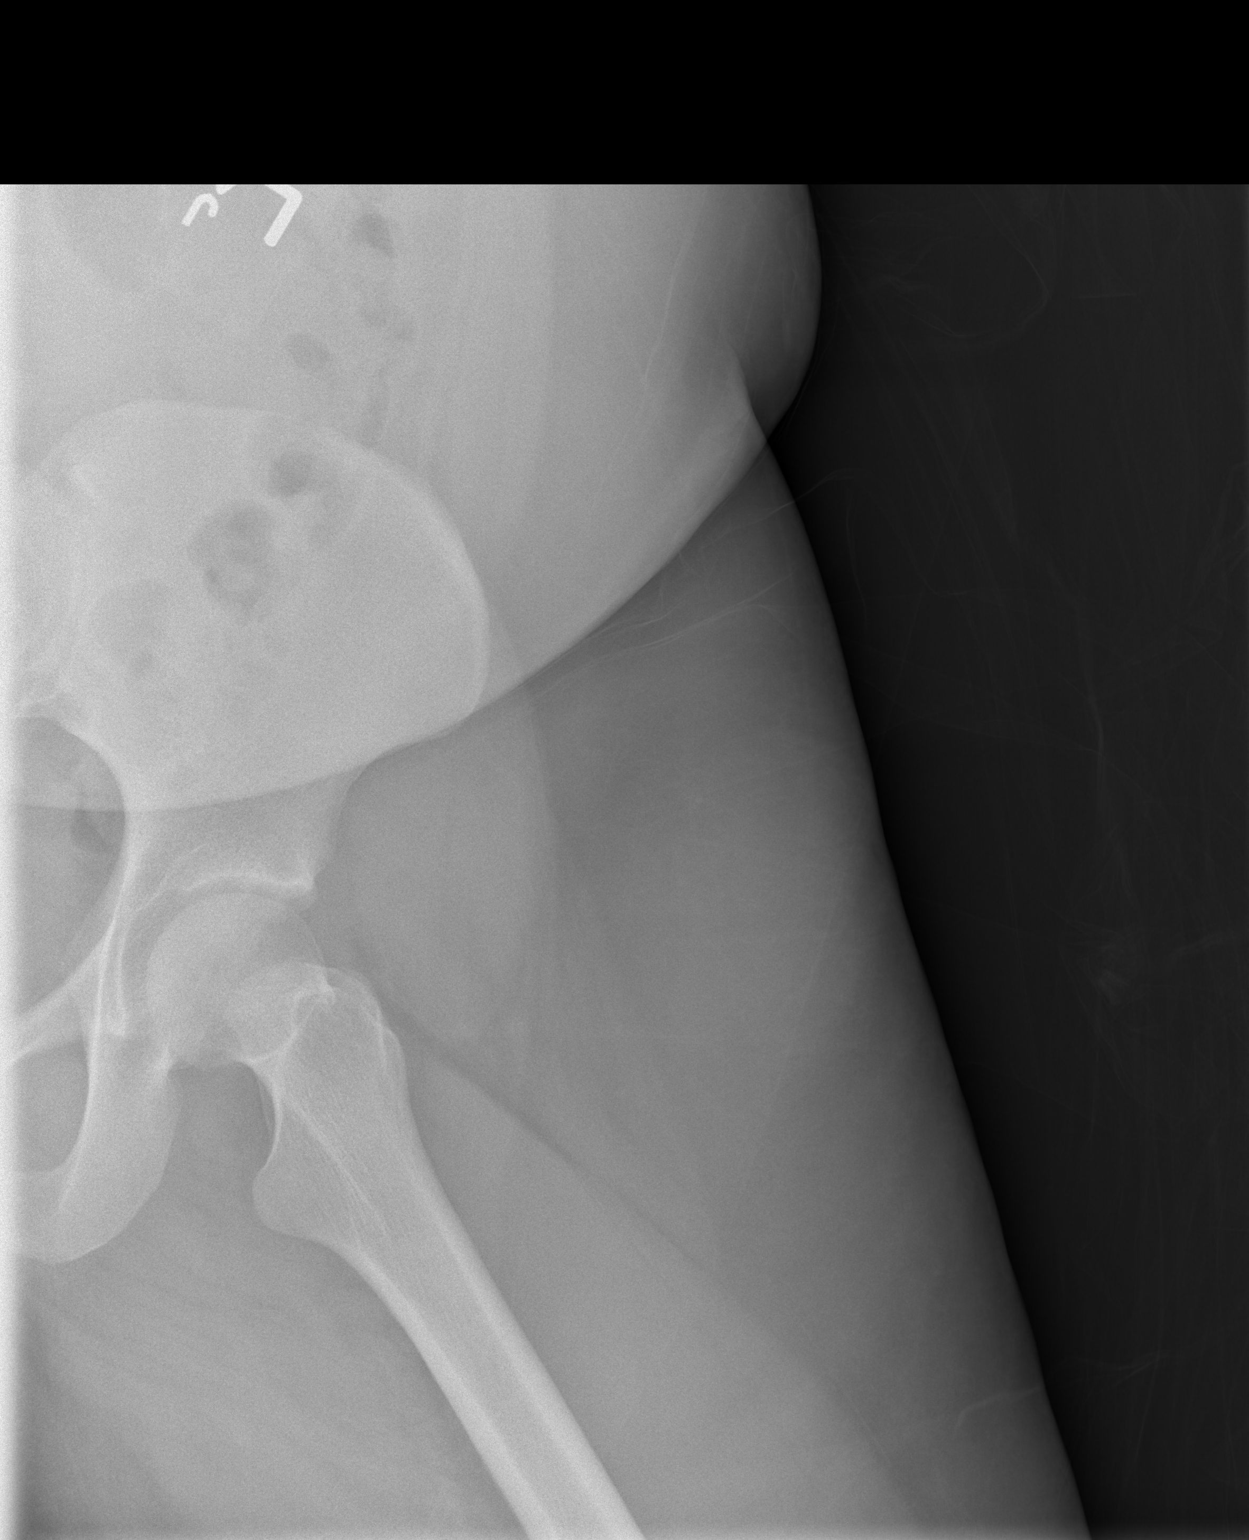

[t pelvis ap]
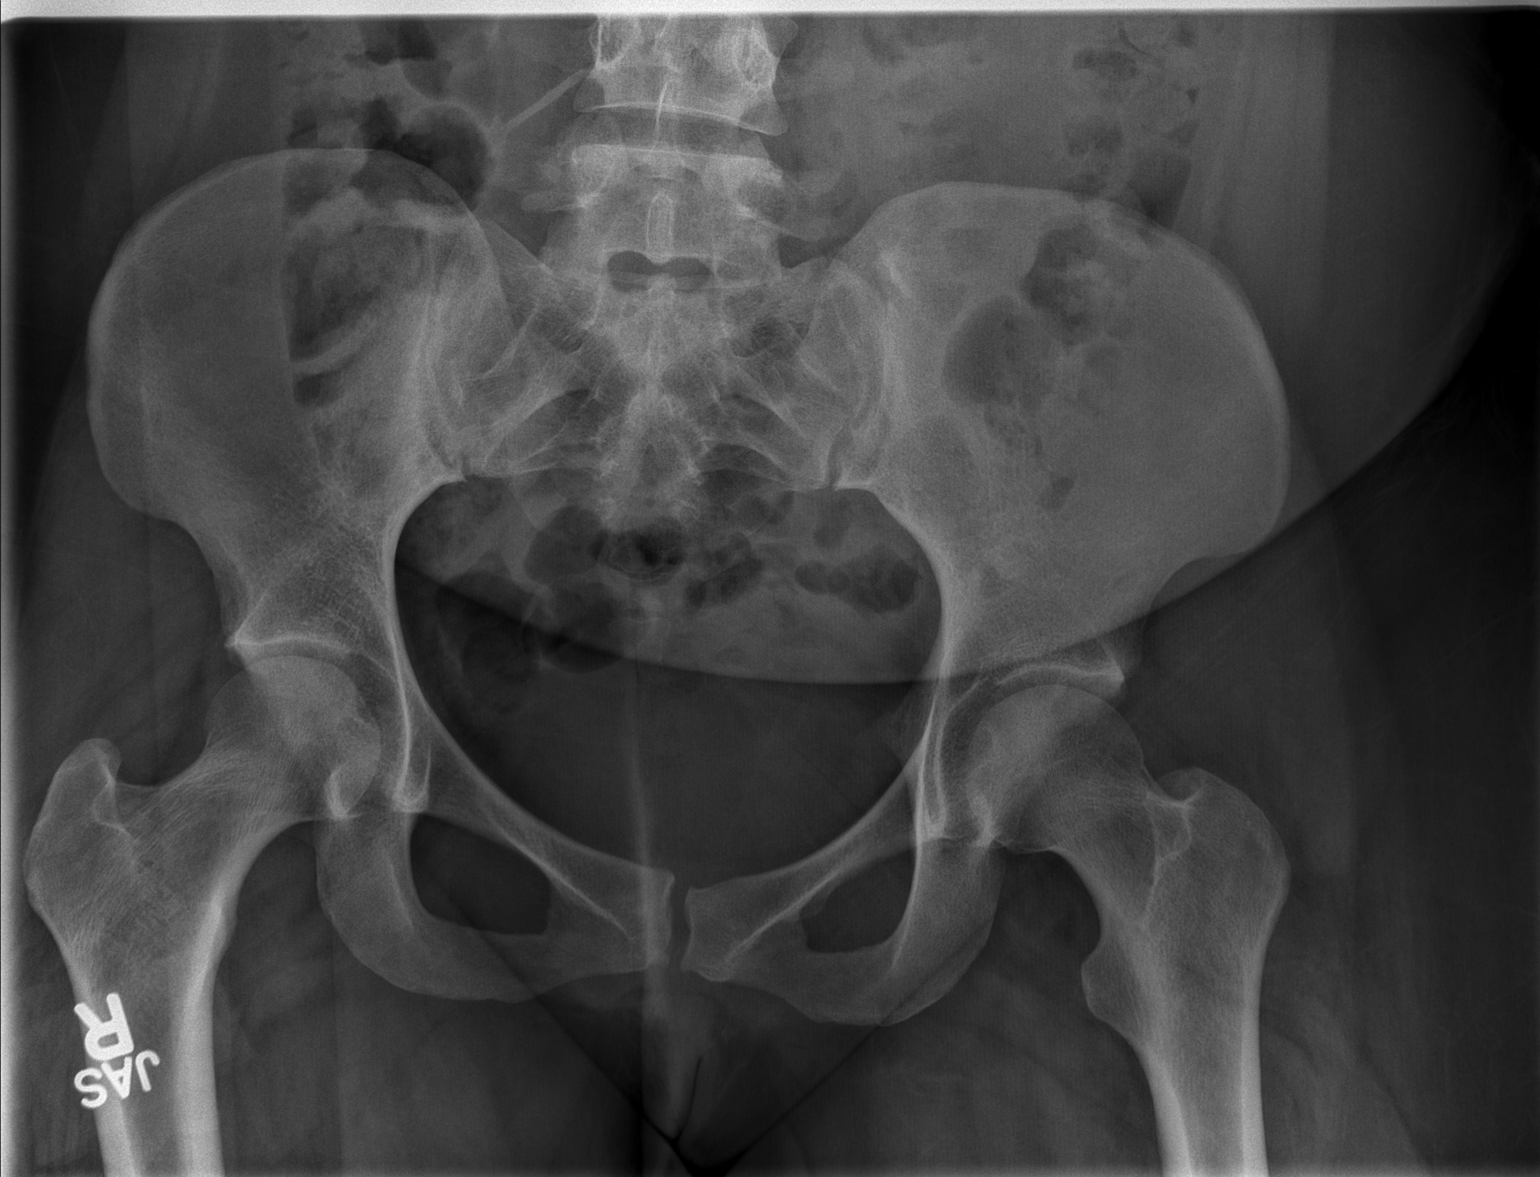

[t hip ap left]
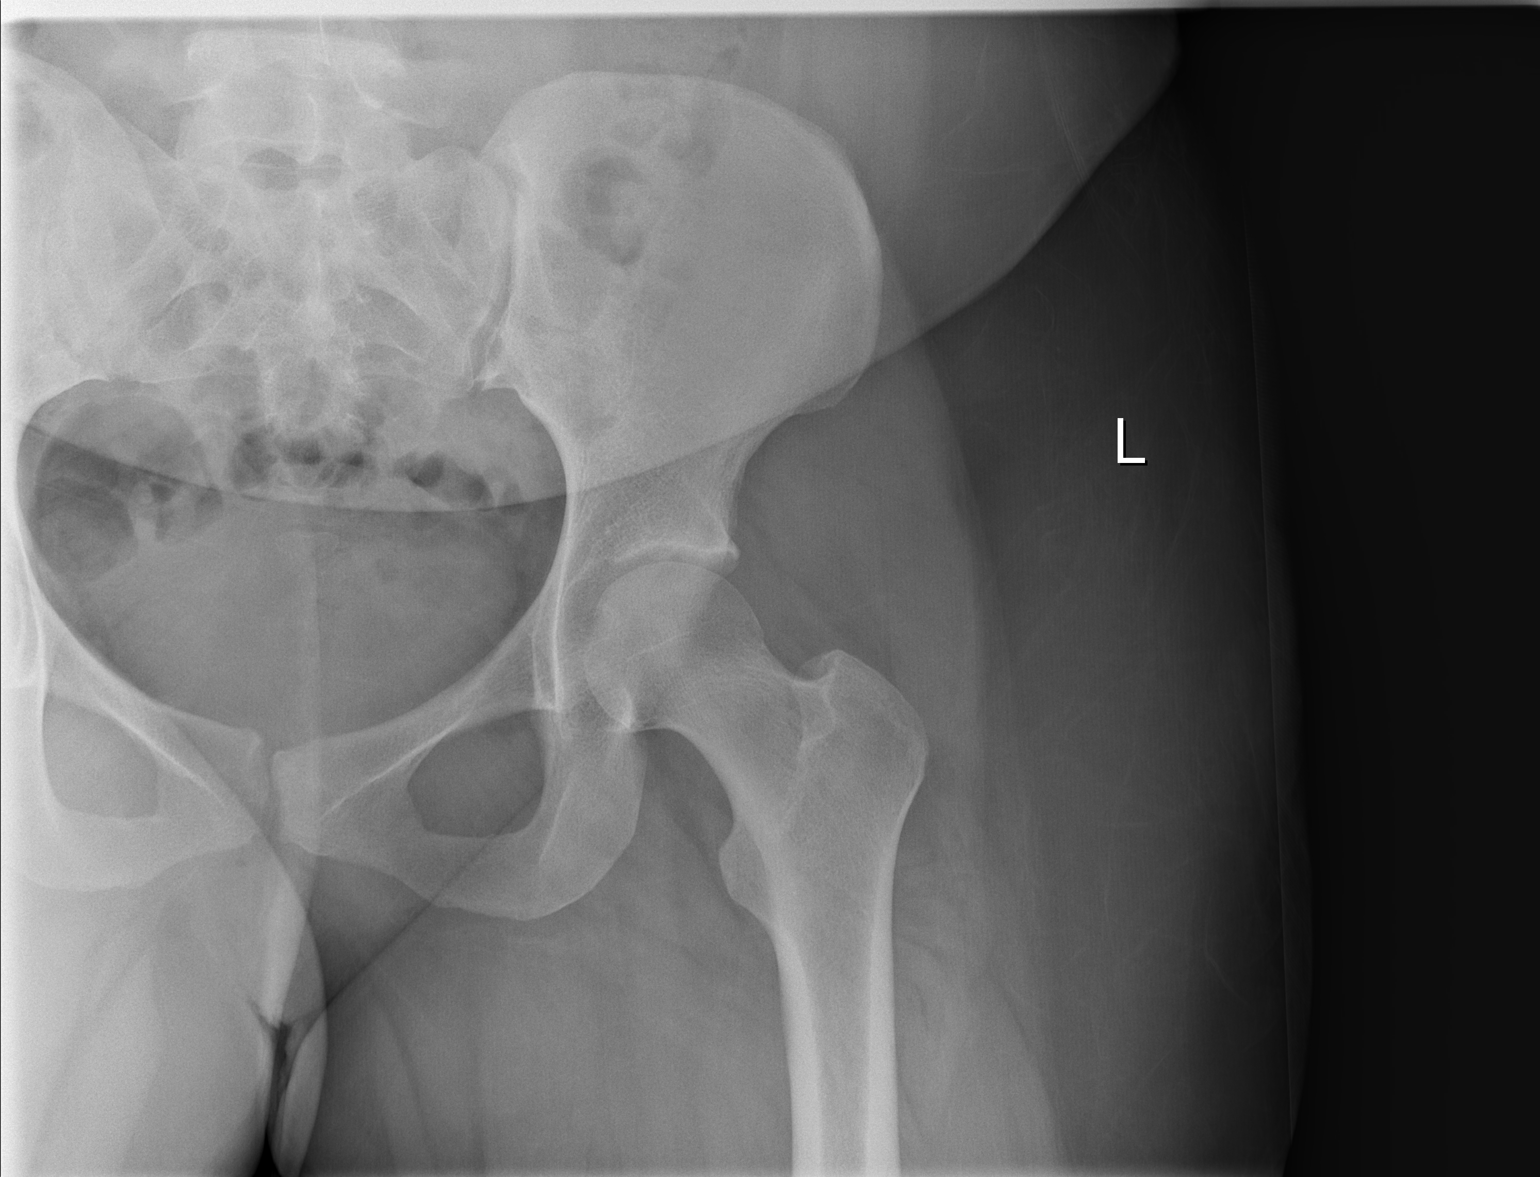

[3 of 3 positions shown; findings below may reference images not displayed]

FINDINGS: There is no evidence of hip fracture or dislocation. There is no
evidence of arthropathy or other focal bone abnormality.
IMPRESSION: Negative.

## 2013-09-13 MED ORDER — IBUPROFEN 800 MG PO TABS
800.0000 mg | ORAL_TABLET | Freq: Once | ORAL | Status: AC
  Administered 2013-09-13: 800 mg via ORAL
  Filled 2013-09-13: qty 1

## 2013-09-13 MED ORDER — OXYCODONE-ACETAMINOPHEN 5-325 MG PO TABS
1.0000 | ORAL_TABLET | Freq: Once | ORAL | Status: AC
  Administered 2013-09-13: 1 via ORAL
  Filled 2013-09-13: qty 1

## 2013-09-13 MED ORDER — OXYCODONE-ACETAMINOPHEN 5-325 MG PO TABS
1.0000 | ORAL_TABLET | Freq: Once | ORAL | Status: AC
Start: 2013-09-13 — End: 2013-09-13
  Administered 2013-09-13: 1 via ORAL
  Filled 2013-09-13: qty 1

## 2013-09-13 MED ORDER — IBUPROFEN 800 MG PO TABS: 800.0000 mg | ORAL_TABLET | Freq: Three times a day (TID) | ORAL | Status: DC

## 2013-09-13 MED ORDER — HYDROCODONE-ACETAMINOPHEN 5-325 MG PO TABS: 1.0000 | ORAL_TABLET | ORAL | Status: DC | PRN

## 2013-09-13 NOTE — ED Provider Notes (Addendum)
Pt fellfell and left knee earlier today. Complains of nonradiating  anterior left knee pain. On exam of lower extremity skin intact. Tenderness all over patella. No definite ligamentous laxity. DP pulse 2+  Orlie Dakin, MD 09/13/13 [place], MD 09/14/13 [phone]

## 2013-09-13 NOTE — ED Notes (Signed)
Pt standing on a stool at home, when pt stepped off of stool she felt something pop in left knee. Pt fell on left side. Per EMS pt has some deformity in left upper leg. Palpable pulse in left leg/sensation intact. 9/10 pain. 148mcg fentanyl by EMS, pain now 0/10. Pt is A/O.

## 2013-09-13 NOTE — Discharge Instructions (Signed)
Take vicodin as needed for severe pain  Take Ibuprofen - this will help with inflammation and swelling  Follow RICE method - see below  Return to the emergency department if you develop any changing/worsening condition or any other concerns (please read additional information regarding your condition below)   Knee Pain Knee pain can be a result of an injury or other medical conditions. Treatment will depend on the cause of your pain. HOME CARE  Only take medicine as told by your doctor.  Keep a healthy weight. Being overweight can make the knee hurt more.  Stretch before exercising or playing sports.  If there is constant knee pain, change the way you exercise. Ask your doctor for advice.  Make sure shoes fit well. Choose the right shoe for the sport or activity.  Protect your knees. Wear kneepads if needed.  Rest when you are tired. GET HELP RIGHT AWAY IF:   Your knee pain does not stop.  Your knee pain does not get better.  Your knee joint feels hot to the touch.  You have a fever. MAKE SURE YOU:   Understand these instructions.  Will watch this condition.  Will get help right away if you are not doing well or get worse. Document Released: 07/22/2008 Document Revised: 07/18/2011 Document Reviewed: 07/22/2008 [facility] Patient Information 2014 [place], [place].  RICE: Routine Care for Injuries The routine care of many injuries includes Rest, Ice, Compression, and Elevation (RICE). HOME CARE INSTRUCTIONS  Rest is needed to allow your body to heal. Routine activities can usually be resumed when comfortable. Injured tendons and bones can take up to 6 weeks to heal. Tendons are the cord-like structures that attach muscle to bone.  Ice following an injury helps keep the swelling down and reduces pain.  Put ice in a plastic bag.  Place a towel between your skin and the bag.  Leave the ice on for 15-20 minutes, 03-04 times a day. Do this while awake, for the first 24 to  48 hours. After that, continue as directed by your caregiver.  Compression helps keep swelling down. It also gives support and helps with discomfort. If an elastic bandage has been applied, it should be removed and reapplied every 3 to 4 hours. It should not be applied tightly, but firmly enough to keep swelling down. Watch fingers or toes for swelling, bluish discoloration, coldness, numbness, or excessive pain. If any of these problems occur, remove the bandage and reapply loosely. Contact your caregiver if these problems continue.  Elevation helps reduce swelling and decreases pain. With extremities, such as the arms, hands, legs, and feet, the injured area should be placed near or above the level of the heart, if possible. SEEK IMMEDIATE MEDICAL CARE IF:  You have persistent pain and swelling.  You develop redness, numbness, or unexpected weakness.  Your symptoms are getting worse rather than improving after several days. These symptoms may indicate that further evaluation or further X-rays are needed. Sometimes, X-rays may not show a small broken bone (fracture) until 1 week or 10 days later. Make a follow-up appointment with your caregiver. Ask when your X-ray results will be ready. Make sure you get your X-ray results. Document Released: 08/07/2000 Document Revised: 07/18/2011 Document Reviewed: 09/24/2010 [facility] Patient Information 2014 [place], [place].   Emergency Department Resource Guide 1) Find a Doctor and Pay Out of Pocket Although you won't have to find out who is covered by your insurance plan, it is a good idea to ask around  and get recommendations. You will then need to call the office and see if the doctor you have chosen will accept you as a new patient and what types of options they offer for patients who are self-pay. Some doctors offer discounts or will set up payment plans for their patients who do not have insurance, but you will need to ask so you aren't surprised when  you get to your appointment.  2) Contact Your Local Health Department Not all health departments have doctors that can see patients for sick visits, but many do, so it is worth a call to see if yours does. If you don't know where your local health department is, you can check in your phone book. The CDC also has a tool to help you locate your state's health department, and many state websites also have listings of all of their local health departments.  3) Find a [facility] If your illness is not likely to be very severe or complicated, you may want to try a walk in clinic. These are popping up all over the country in pharmacies, drugstores, and shopping centers. They're usually staffed by nurse practitioners or physician assistants that have been trained to treat common illnesses and complaints. They're usually fairly quick and inexpensive. However, if you have serious medical issues or chronic medical problems, these are probably not your best option.  No Primary Care Doctor: - Call Health Connect at  [phone] - they can help you locate a primary care doctor that  accepts your insurance, provides certain services, etc. - Physician Referral Service- [phone]  Chronic Pain Problems: Organization         Address  Phone   Notes  [facility]  [phone] Patients need to be referred by their primary care doctor.   Medication Assistance: Organization         Address  Phone   Notes  [facility] [address] [place], [place] [address] [phone] --Must be a resident of [facility] -- Must have NO insurance coverage whatsoever (no Medicaid/ Medicare, etc.) -- The pt. MUST have a primary care doctor that directs their care regularly and follows them in the community   [facility]ist  [phone]   [employer]  [phone]    Agencies that provide inexpensive medical care: Organization         Address  Phone    Notes  [place]  [phone]   [facility]    [phone]   [facility] [place], [place] [address] [phone]   [place] [address] [place] [phone]   Planned Parenthood    [phone]   [facility]    [phone]   [place] and [address], [place] Phone:  [phone], Fax:  [phone] Hours of Operation:  9 am - 6 pm, M-F.  Also accepts Medicaid/Medicare and self-pay.  [facility] for [place] [address], [address], [place] Phone: [phone], Fax: [phone]. Hours of Operation:  8:30 am - 5:30 pm, M-F.  Also accepts Medicaid and self-pay.  [facility] [place] [address] [place] Phone: [phone]   [place], [place], [place] [phone], Ext. [phone] Mondays & Thursdays: 7-9 AM.  First 15 patients are seen on  a first come, first serve basis.    [place] Providers:  Organization         Address  Phone   Notes  [facility] [address], [place] [phone] Also accepts self-pay patients.  [facility] [address], [place]  [phone]   [place], [address], [place] [phone]   [facility] [address] [place] [phone]   Lucianne Lei [address], [place]   [phone] Only accepts [place] Access [place] patients after they have their name applied to their card.   Self-Pay (no insurance) in [facility]:  Organization         Address  Phone   Notes  Sickle Cell Patients, [facility] [place] [phone]   [facility] [place] [phone]   [facility]acarias Pontes  Urgent [facility] [place]  [place], [place], [place] [phone]   [facility]/Dr. Osei-Bonsu  [address] [place] or [address], [place] [phone] Phone number for both [place] and [place] locations is the same.  Urgent Medical and [facility] [address] [place] [phone]   [facility] [address] [place] or [address] [phone]   [facility] [address], [place] [phone], phone; [phone], fax Sees patients 1st and 3rd Saturday of every month.  Must not qualify for public or private insurance (i.e. Medicaid, Medicare, [place] Health Choice, Veterans' Benefits)  Household income should be no more than 200% of the poverty level The clinic cannot treat you if you are pregnant or think you are pregnant  Sexually transmitted diseases are not treated at the clinic.    Dental Care: Organization         Address  Phone  Notes  [facility] Department of [facility] [place] [phone] Accepts children up to age 6 who are enrolled in [place] or [place]; pregnant women with a Medicaid card; and children who have applied for Medicaid or [place] Health Choice, but were declined, whose parents can pay a reduced fee at time of service.  [facility] Department of [facility]  [address], [place] [phone] Accepts children up to age 32 who are enrolled in [place] or [place]; pregnant women with a Medicaid card; and children who have applied for Medicaid or  Health Choice, but were declined, whose parents can pay a reduced fee at time of service.  [employer] Adult Dental Access PROGRAM  [place] [phone] Patients are seen by appointment only. Walk-ins are not accepted. [place] will see patients 55 years of age  and older. Monday - Tuesday (8am-5pm) Most Wednesdays (8:30-5pm) $30 per visit, cash only  [facility] Adult Dental Access PROGRAM  [address], [facility] [phone] Patients are seen by appointment only. Walk-ins are not accepted. [place] will see patients 9 years of age and older. One Wednesday Evening (Monthly: Volunteer Based).  $30 per visit, cash only  [place]  [phone] for adults; Children under age 12, call Graduate Pediatric Dentistry at [phone]. Children  aged 394-14, please call [phone] to request a pediatric application.  Dental services are provided in all areas of dental care including fillings, crowns and bridges, complete and partial dentures, implants, gum treatment, root canals, and extractions. Preventive care is also provided. Treatment is provided to both adults and children. Patients are selected via a lottery and there is often a waiting list.   [facility] [address], [place]  [phone] [email or web address]   [facility] [address], [place], [place] [phone], Ext. [phone] Second and Fourth Thursday of each month, opens at 6:30 AM; Clinic ends at 9 AM.  Patients are seen on a first-come first-served basis, and a limited number are seen during each clinic.   [facility]  [address], [place], [place] [phone]   Eligibility Requirements You must have lived in [place], [place], or [place] counties for at least the last three months.   You cannot be eligible for state or federal sponsored [place]healthcare insurance, including [employer]IG[employer]eterans Administration, [place]icaid, or [employer]icare.   You generally cannot be eligible for healthcare insurance through your employer.    How to apply: Eligibility screenings are held every Tuesday and Wednesday afternoon from 1:00 pm until 4:00 pm. You do not need an appointment for the interview!  [facility] [address], [place], [place] [phone]   [facility]  [phone]   [facility]  [phone]   [facility]  [phone]    Behavioral Health Resources in the Community: Intensive Outpatient Programs Organization         Address  Phone  Notes  [facility] [address], [place], [place] [phone]   [facility] [address], [place], [place] [phone]   [facility] [address], [place], [place]  [phone]   [facility] [address],  [place], [place] [phone] or [phone]   Substance Abuse Resources Organization         Address  Phone  Notes  [facility] Drug Services  [phone]   [facility]  [phone]   [facility]  [phone]   [facility]  [phone]   Residential & Outpatient Substance Abuse Program  [phone]   Psychological Services Organization         Address  Phone  Notes  [facility]  [phone]   [facility]  [phone]- [phone]   [facility] [address], [place] [phone] or [phone]    Mobile Crisis Teams Organization         Address  Phone  Notes  [facility], Mobile Crisis Care Unit  [phone]   [facility]  [address] [place], [place] [phone]   Doristine LocksSharon DeEsch [address] [place] [phone]    Self-Help/Support Groups Organization         Address  Phone             Notes  [facility]oc[facility] of [place] - variety of support groups  [phone] Call for more information  Narcotics Anonymous (NA), [facility] [address], [place]  2 meetings at this location   Statisticianesidential Treatment Programs Organization         Address  Phone  Notes  [facility] [address],    [place]  [phone]  [facility]  [address] [place] [phone], [place], [place]   [place], [place] [phone] Admissions: 8am-3pm M-F  Incentives Substance [place] [address],    [place], [place] [phone]   [facility] [address] [place], [place], [place]   [facility] [address]  [place], [place]   [facility] Programs - Intensive Outpatient [address], [place], [place]   [facility] [place].,  [place], [place] [phone] or [phone]   [facility] Services (RTS) [address] [place], [place] Accepts Medicaid  [place] [address]  [place] [phone] Substance Abuse/Addiction Treatment   [facility] Organization         Address  Phone  Notes  [facility]  [phone]   Domenic Schwab, PhD [address] [place], [place]   [phone] or [phone]   [place], [place] [phone]   [facility] [address] [place], [place] [phone] Insurance/Medicaid/sponsorship through [facility] and Families [address], [address] [place]                                    [place], [place] [phone] [place] [address], [place] [phone]    Dr. Adele Schilder  [phone]   [facility] Dept. 1) [address], [place] 2) [place] 3)  [address], [place] [phone]  [phone]   [place] [phone] or [phone] (After Hours)

## 2013-09-13 NOTE — ED Notes (Signed)
Patient transported to X-ray 

## 2013-09-13 NOTE — ED Provider Notes (Signed)
CSN: [id number]     Arrival date & time 09/13/13  1610 History   First MD Initiated Contact with Patient 09/13/13 1632     Chief Complaint  Patient presents with  . Fall   HPI  [name] is a 38 y.o. female with a PMH of uterine fibroids who presents to the ED for evaluation of a fall. History was provided by the patient. Patient states that she was cleaning in the kitchen when she went to step down she slipped in some water and had fell directly onto her left knee. States she heard a "pop" in her knee. She complains of left knee and hip pain. Patient denies any other injuries including head injury or LOC. Denies weakness, loss of sensation or numbness/tingling. Patient unable to ambulate after fall. Given fentanyl PTA with some relief of her pain. Has a small laceration over her left knee. Last tetanus two years ago.    Past Medical History  Diagnosis Date  . Uterine fibroid    Past Surgical History  Procedure Laterality Date  . Cesarean section      x 2  . Abdominal hysterectomy      2010   History reviewed. No pertinent family history. History  Substance Use Topics  . Smoking status: Never Smoker   . Smokeless tobacco: Not on file  . Alcohol Use: No   OB History   Grav Para Term Preterm Abortions TAB SAB Ect Mult Living   7 5 4 1 2 1 1   5       Review of Systems  Respiratory: Negative for shortness of breath.   Cardiovascular: Negative for chest pain.  Gastrointestinal: Negative for nausea, vomiting and abdominal pain.  Musculoskeletal: Negative for back pain and neck pain.  Skin: Positive for wound. Negative for color change.  Neurological: Negative for dizziness, weakness, light-headedness, numbness and headaches.    Allergies  Review of patient's allergies indicates no known allergies.  Home Medications   Prior to Admission medications   Medication Sig Start Date End Date Taking? Authorizing Provider  HYDROcodone-acetaminophen (NORCO/VICODIN) 5-325 MG per  tablet Take 1 tablet by mouth every 4 (four) hours as needed. 05/17/13   Janne Napoleon, NP  ibuprofen (ADVIL,MOTRIN) 600 MG tablet Take 1 tablet (600 mg total) by mouth every 6 (six) hours as needed for pain. 06/13/12   Carlisle Cater, PA-C  ibuprofen (ADVIL,MOTRIN) 800 MG tablet Take 1 tablet (800 mg total) by mouth 3 (three) times daily. For 2 days then as needed for pain 05/17/13   Janne Napoleon, NP  methocarbamol (ROBAXIN) 500 MG tablet Take 2 tablets (1,000 mg total) by mouth 4 (four) times daily. 06/13/12   Carlisle Cater, PA-C  silver sulfADIAZINE (SILVADENE) 1 % cream Apply 1 application topically daily. 05/17/13   Janne Napoleon, NP   BP 142/74  Temp(Src) 97.7 F (36.5 C) (Oral)  Resp 16  Ht 5\' 8"  (1.727 m)  Wt 197 lb (89.359 kg)  BMI 29.96 kg/m2  SpO2 100%  Filed Vitals:   09/13/13 1700 09/13/13 1815 09/13/13 1845 09/13/13 1930  BP: 141/67 136/83 132/72 117/82  Pulse: 77 92 85 89  Temp:    98 F (36.7 C)  TempSrc:    Oral  Resp:  16  16  Height:      Weight:      SpO2: 100% 100% 99% 100%    Physical Exam  Nursing note and vitals reviewed. Constitutional: She is oriented to person, place, and time.  She appears well-developed and well-nourished. No distress.  HENT:  Head: Normocephalic and atraumatic.  Right Ear: External ear normal.  Left Ear: External ear normal.  Nose: Nose normal.  Mouth/Throat: Oropharynx is clear and moist. No oropharyngeal exudate.  No tenderness to the scalp or face throughout. No palpable hematoma, step-offs, or lacerations throughout.  Tympanic membranes gray and translucent bilaterally.    Eyes: Conjunctivae and EOM are normal. Pupils are equal, round, and reactive to light. Right eye exhibits no discharge. Left eye exhibits no discharge.  Neck: Normal range of motion. Neck supple.  Cardiovascular: Normal rate, regular rhythm, normal heart sounds and intact distal pulses.  Exam reveals no gallop and no friction rub.   No murmur heard. Dorsalis pedis pulses  present and equal bilaterally  Pulmonary/Chest: Effort normal and breath sounds normal. No respiratory distress. She has no wheezes. She has no rales. She exhibits no tenderness.  Abdominal: Soft. She exhibits no distension. There is no tenderness.  Musculoskeletal: Normal range of motion. She exhibits edema and tenderness.       Legs: Mild edema to the left knee. Tenderness to palpation to the left knee diffusely. Pain worse with ROM which is limited due to pain. No ligament laxity to the left knee. Left lateral hip tenderness to palpation. No pain with internal and external rotation of the left hip. No calf, ankle, or thigh tenderness.    Neurological: She is alert and oriented to person, place, and time.  Skin: Skin is warm and dry. She is not diaphoretic.  1 cm superficial laceration to the left anterior knee with no open wound or drainage.     ED Course  Procedures (including critical care time) Labs Review Labs Reviewed - No data to display  Imaging Review Dg Hip Complete Left  09/13/2013   CLINICAL DATA:  FALL  EXAM: LEFT HIP - COMPLETE 2+ VIEW  COMPARISON:  None.  FINDINGS: There is no evidence of hip fracture or dislocation. There is no evidence of arthropathy or other focal bone abnormality.  IMPRESSION: Negative.   Electronically Signed   By: Margaree Mackintosh M.D.   On: 09/13/2013 18:18   Dg Knee Complete 4 Views Left  09/13/2013   CLINICAL DATA:  fall  EXAM: LEFT KNEE - COMPLETE 4+ VIEW  COMPARISON:  None.  FINDINGS: There is no evidence of fracture, dislocation, or joint effusion. There is no evidence of arthropathy or other focal bone abnormality. Soft tissues are unremarkable.  IMPRESSION: Negative.   Electronically Signed   By: Margaree Mackintosh M.D.   On: 09/13/2013 18:18     EKG Interpretation None      MDM   [name] is a 38 y.o. female with a PMH of uterine fibroids who presents to the ED for evaluation of a fall. X-rays of the left knee and hip negative for  fracture or malalignment. Patient neurovascularly intact. Patient given knee immobilizer, crutches and follow-up with orthopedics. RICE method discussed. Laceration did not require repair. Tetanus up to date. Return precautions, discharge instructions, and follow-up was discussed with the patient before discharge.     Discharge Medication List as of 09/13/2013  7:27 PM    START taking these medications   Details  HYDROcodone-acetaminophen (NORCO/VICODIN) 5-325 MG per tablet Take 1-2 tablets by mouth every 4 (four) hours as needed for moderate pain., Starting 09/13/2013, Until Discontinued, Print    ibuprofen (ADVIL,MOTRIN) 800 MG tablet Take 1 tablet (800 mg total) by mouth 3 (three) times  daily., Starting 09/13/2013, Until Discontinued, Print         Final impressions: 1. Left knee pain       Mercy Moore PA-C   This patient was discussed with Dr. Elicia Lamp, PA-C 09/14/13 1141

## 2013-09-13 NOTE — ED Notes (Signed)
Notified PA that pt's pain still 8/10. Will order more pain medication upon PA reassessment.

## 2013-09-13 NOTE — ED Notes (Signed)
MD at the bedside  

## 2013-09-13 NOTE — ED Notes (Signed)
Pt's leg in L upper leg in comparison to right upper leg does not appear to be deformed.

## 2013-09-14 NOTE — ED Provider Notes (Signed)
Medical screening examination/treatment/procedure(s) were conducted as a shared visit with non-physician practitioner(s) and myself.  I personally evaluated the patient during the encounter.   EKG Interpretation None       Orlie Dakin, MD 09/14/13 1457

## 2014-01-24 ENCOUNTER — Encounter (HOSPITAL_COMMUNITY): Payer: Self-pay | Admitting: Emergency Medicine

## 2014-01-24 ENCOUNTER — Emergency Department (INDEPENDENT_AMBULATORY_CARE_PROVIDER_SITE_OTHER)
Admission: EM | Admit: 2014-01-24 | Discharge: 2014-01-24 | Disposition: A | Discharge status: Home / Self Care | Payer: Self-pay | Source: Home / Self Care | Attending: Family Medicine | Admitting: Family Medicine

## 2014-01-24 DIAGNOSIS — M549 Dorsalgia, unspecified: Secondary | ICD-10-CM

## 2014-01-24 DIAGNOSIS — M542 Cervicalgia: Secondary | ICD-10-CM

## 2014-01-24 MED ORDER — IBUPROFEN 800 MG PO TABS: 800.0000 mg | ORAL_TABLET | Freq: Three times a day (TID) | ORAL | Status: DC

## 2014-01-24 NOTE — Discharge Instructions (Signed)
Heat and medicine as needed, routine activity, return as needed.
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
## 2014-03-10 ENCOUNTER — Encounter (HOSPITAL_COMMUNITY): Payer: Self-pay | Admitting: Emergency Medicine

## 2015-07-17 ENCOUNTER — Emergency Department (HOSPITAL_COMMUNITY)
Admission: EM | Admit: 2015-07-17 | Discharge: 2015-07-17 | Disposition: A | Discharge status: Home / Self Care | Payer: Medicaid Other | Attending: Emergency Medicine | Admitting: Emergency Medicine

## 2015-07-17 ENCOUNTER — Encounter (HOSPITAL_COMMUNITY): Payer: Self-pay | Admitting: Emergency Medicine

## 2015-07-17 DIAGNOSIS — K5900 Constipation, unspecified: Secondary | ICD-10-CM | POA: Insufficient documentation

## 2015-07-17 DIAGNOSIS — Z86018 Personal history of other benign neoplasm: Secondary | ICD-10-CM | POA: Diagnosis not present

## 2015-07-17 DIAGNOSIS — R109 Unspecified abdominal pain: Secondary | ICD-10-CM | POA: Diagnosis present

## 2015-07-17 MED ORDER — POLYETHYLENE GLYCOL 3350 17 G PO PACK: 17.0000 g | PACK | Freq: Every day | ORAL | Status: DC

## 2015-07-17 MED ORDER — FLEET ENEMA 7-19 GM/118ML RE ENEM
1.0000 | ENEMA | Freq: Once | RECTAL | Status: AC
  Administered 2015-07-17: 1 via RECTAL
  Filled 2015-07-17: qty 1

## 2015-07-17 MED ORDER — LIDOCAINE HCL 2 % EX GEL
1.0000 "application " | Freq: Once | CUTANEOUS | Status: AC
  Administered 2015-07-17: 1
  Filled 2015-07-17: qty 11

## 2015-07-17 NOTE — ED Provider Notes (Signed)
CSN: [id number]     Arrival date & time 07/17/15  1045 History   First MD Initiated Contact with Patient 07/17/15 1102     Chief Complaint  Patient presents with  . Abdominal Pain  . Constipation    HPI   40 year old female presents today with complaints of constipation. Patient reports that over the last several days she's had decreasing stool volume, noting that she's had very hard difficult to pass stools. She notes that today she was only able to get a small amount out, started having abdominal pain. She reports that she attempted manual disimpaction but the stool mass was too firm. She attempted using a enema this did not provide significant relief. Patient denies any nausea or vomiting, upper abdominal pain, pelvic pain, fever, chills. Patient denies any history of bowel obstruction, does note a history of abdominal hysterectomy and cesarean section 2. Reports adequate by mouth intake including water.      Past Medical History  Diagnosis Date  . Uterine fibroid    Past Surgical History  Procedure Laterality Date  . Cesarean section      x 2  . Abdominal hysterectomy      2010   History reviewed. No pertinent family history. Social History  Substance Use Topics  . Smoking status: Never Smoker   . Smokeless tobacco: None  . Alcohol Use: No   OB History    Gravida Para Term Preterm AB TAB SAB Ectopic Multiple Living   7 5 4 1 2 1 1   5      Review of Systems  All other systems reviewed and are negative.   Allergies  Review of patient's allergies indicates no known allergies.  Home Medications   Prior to Admission medications   Medication Sig Start Date End Date Taking? Authorizing Provider  polyethylene glycol (MIRALAX / GLYCOLAX) packet Take 17 g by mouth daily. 07/17/15   Jaquarious Grey, PA-C   BP 133/88 mmHg  Pulse 119  Temp(Src) 98.4 F (36.9 C) (Oral)  Resp 18  SpO2 100%   Physical Exam  Constitutional: She is oriented to person, place, and time. She  appears well-developed and well-nourished.  HENT:  Head: Normocephalic and atraumatic.  Eyes: Conjunctivae are normal. Pupils are equal, round, and reactive to light. Right eye exhibits no discharge. Left eye exhibits no discharge. No scleral icterus.  Neck: Normal range of motion. No JVD present. No tracheal deviation present.  Pulmonary/Chest: Effort normal. No stridor.  Abdominal: Soft. She exhibits no distension and no mass. There is tenderness. There is no rebound and no guarding.  Mild diffuse abdominal discomfort no focal findings, nonsurgical abdomen  Genitourinary:  No stool in rectal vault  Neurological: She is alert and oriented to person, place, and time. Coordination normal.  Psychiatric: She has a normal mood and affect. Her behavior is normal. Judgment and thought content normal.  Nursing note and vitals reviewed.   ED Course  Procedures (including critical care time) Labs Review Labs Reviewed - No data to display  Imaging Review No results found. I have personally reviewed and evaluated these images and lab results as part of my medical decision-making.   EKG Interpretation None      MDM   Final diagnoses:  Constipation, unspecified constipation type    Labs:   Imaging:  Consults:  Therapeutics: Enema  Discharge Meds: MiraLAX  Assessment/Plan: 40 year old female presents with constipation. I was unable to manually disimpact, enema provided significant passing stool. Patient feeling greatly improved,  no abdominal pain requesting discharge home. She'll be discharged home with MiraLAX, instructed to increase fiber intake, increase water intake. Patient given return precautions, verbalized understanding and agreement to today's plan.        Okey Regal, PA-C 07/17/15 [place], MD 07/19/15 2138

## 2015-07-17 NOTE — ED Notes (Signed)
Pt reports constipation and midline lower abd pain. LBM was 4 days ago. Pt has tried OTC laxatives and enemas with no relief. Pt had a small BM earlier today which has not relieved pain.

## 2015-07-17 NOTE — ED Notes (Signed)
Bedside commode in room. Pt familiar with Fleet Enema and prefers to administer herself.

## 2015-07-17 NOTE — Discharge Instructions (Signed)

## 2015-07-17 NOTE — ED Notes (Signed)
Lidocaine at bedside.

## 2017-06-26 IMAGING — US US ABDOMEN LIMITED
1 series · 14 of 25 positions shown · non-contrast
Comparison: None.

CLINICAL DATA: Right upper quadrant pain, nausea

EXAM:
ULTRASOUND ABDOMEN LIMITED RIGHT UPPER QUADRANT

[Series 1: us abdomen limited · 0.23mm/px · 14 of 50 slices shown]
[im 1/50]
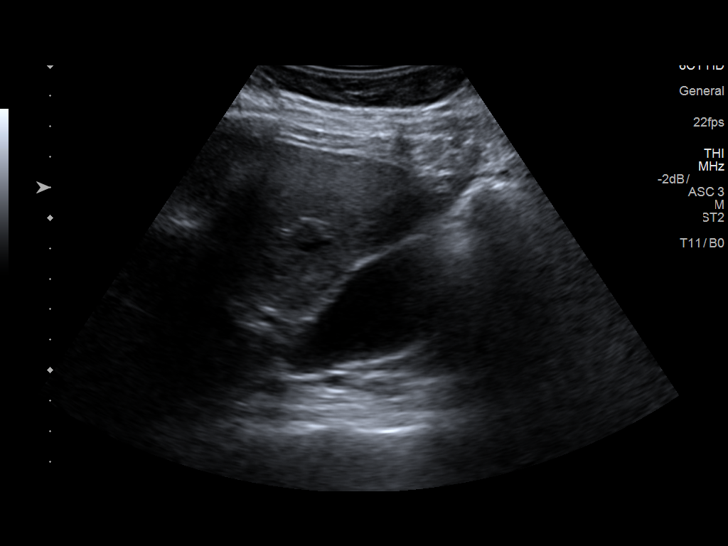
[im 5/50]
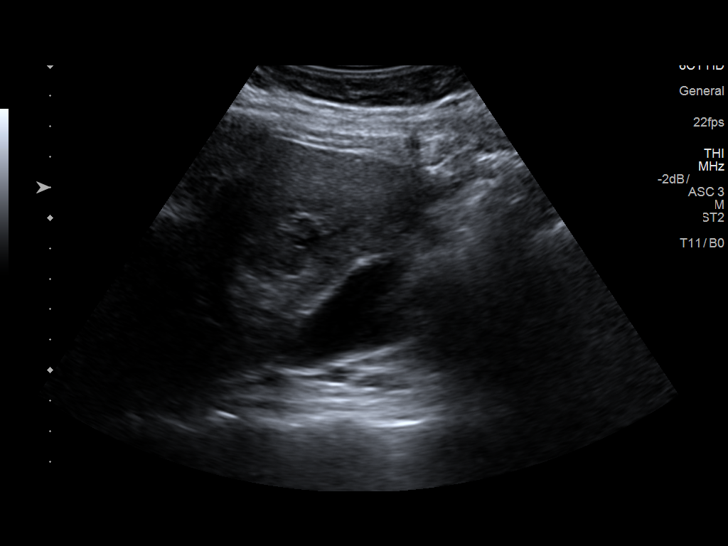
[im 9/50]
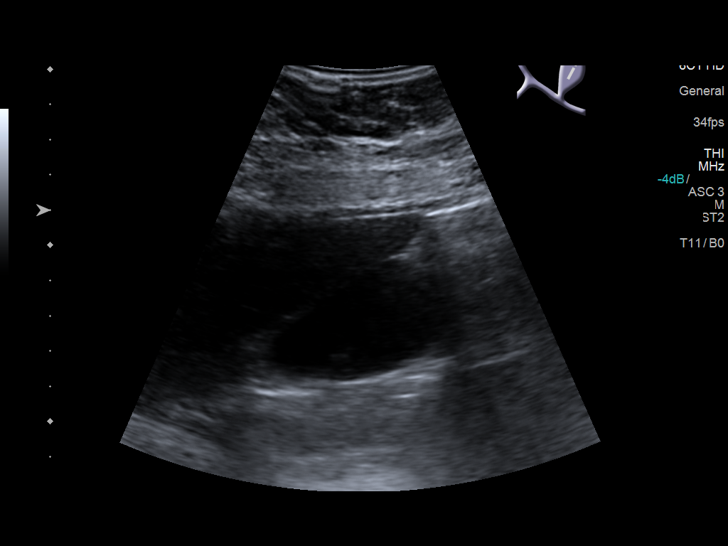
[im 13/50]
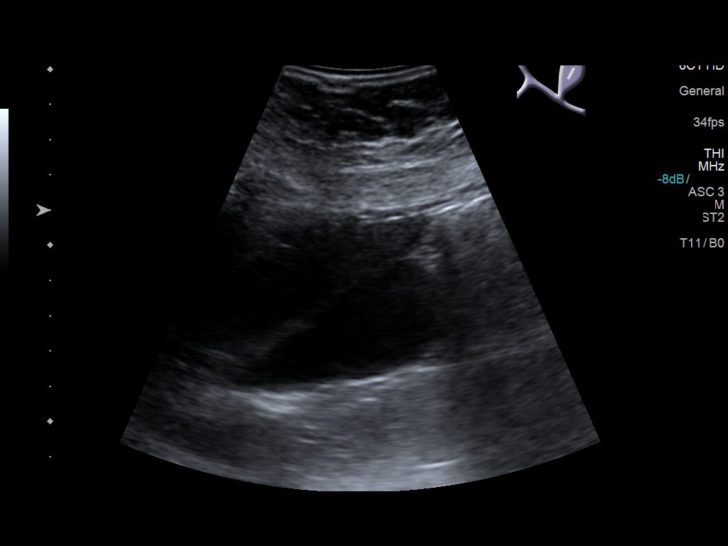
[im 17/50]
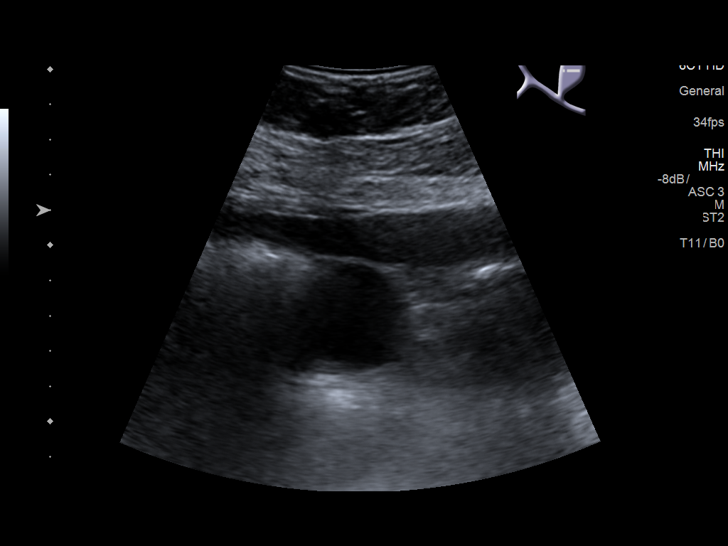
[im 19/50]
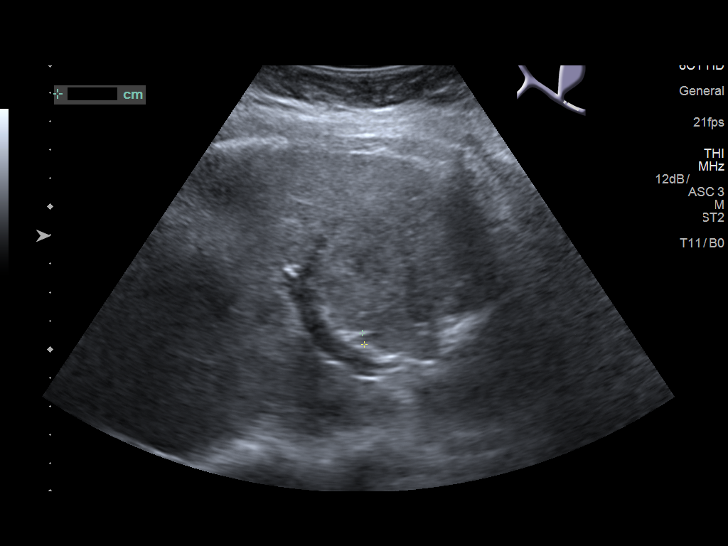
[im 23/50]
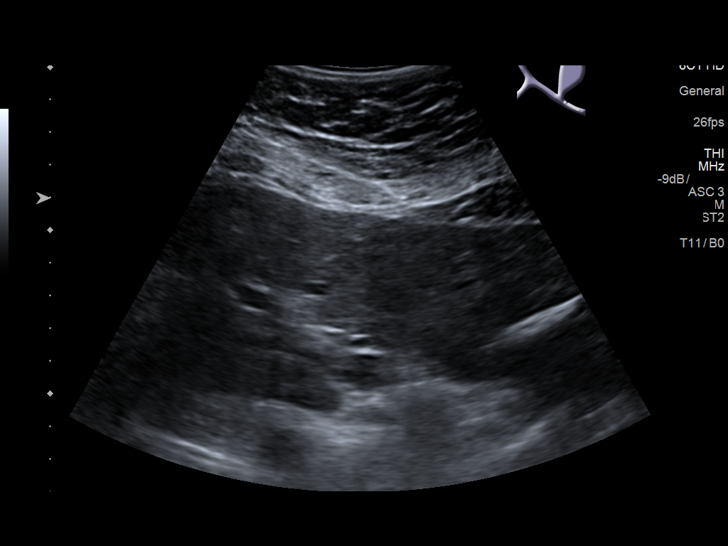
[im 27/50]
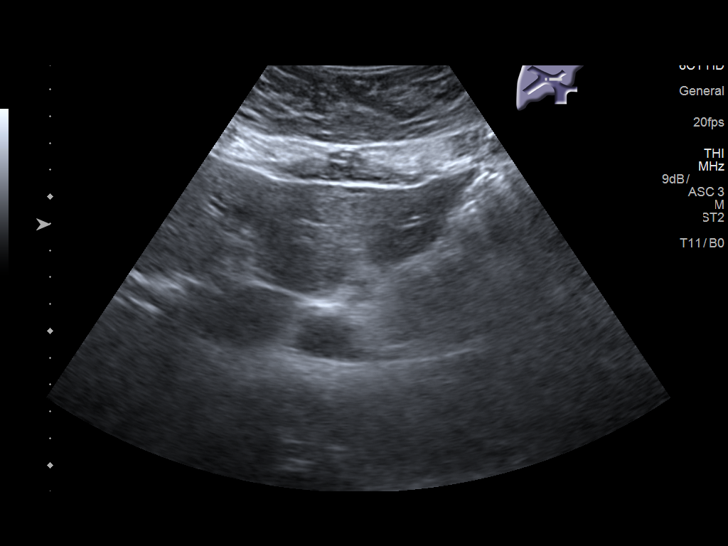
[im 31/50]
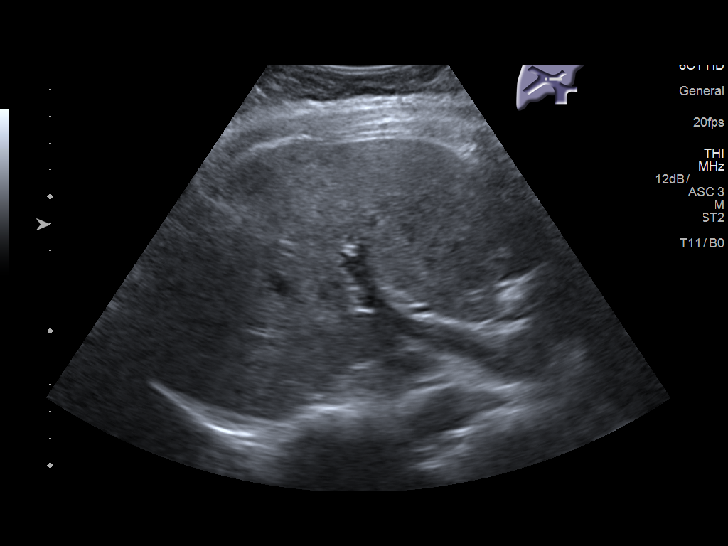
[im 33/50]
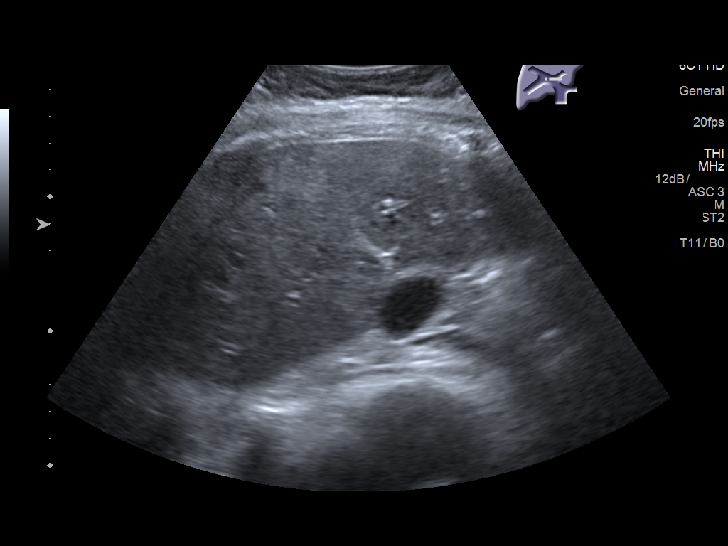
[im 37/50]
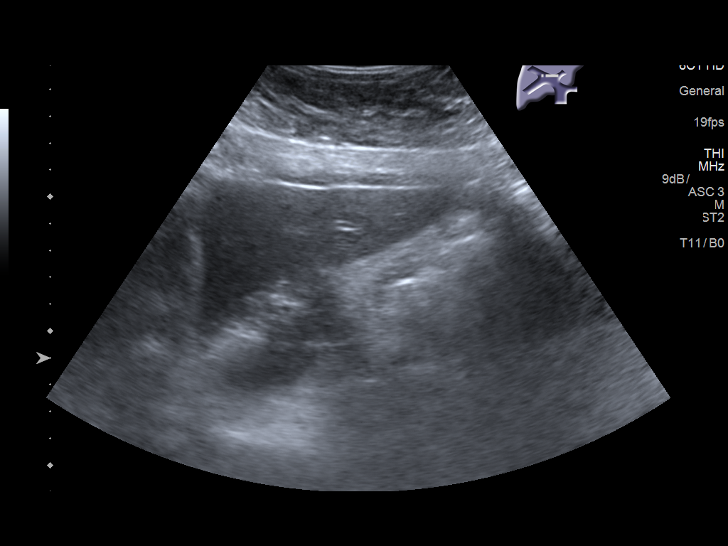
[im 41/50]
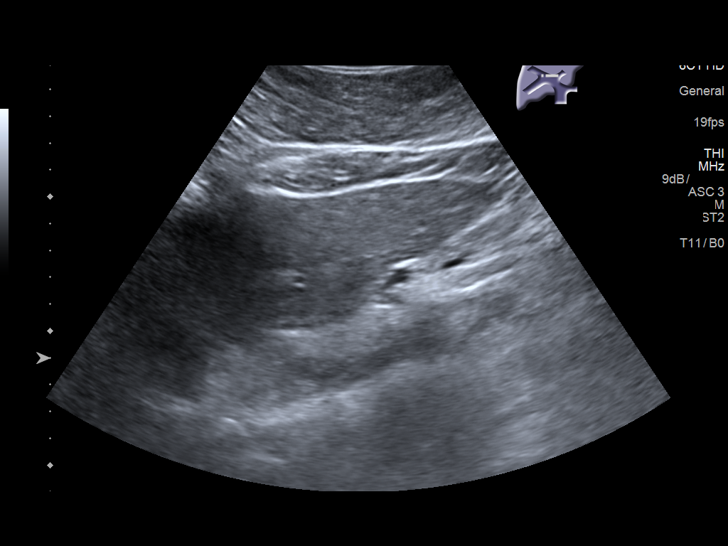
[im 45/50]
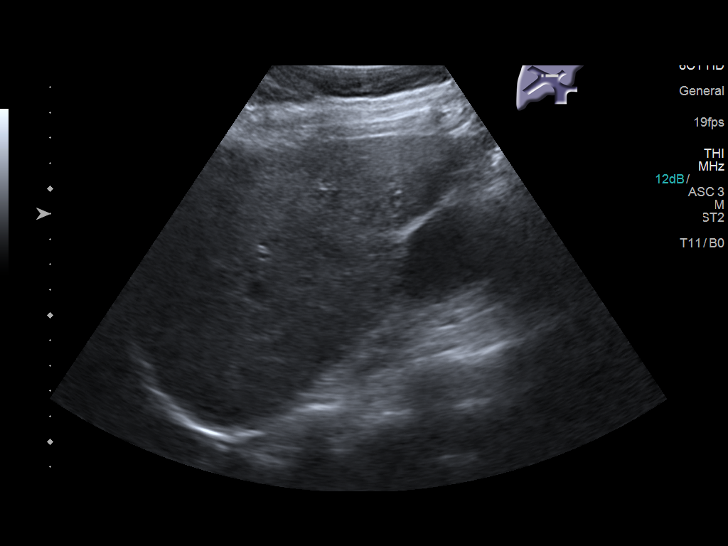
[im 50/50]
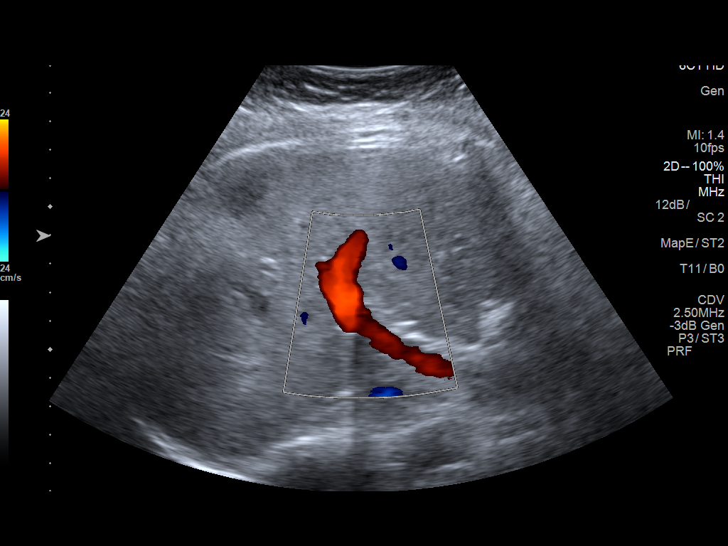

[14 of 25 positions shown; findings below may reference images not displayed]

FINDINGS: Gallbladder:

No gallstones, gallbladder wall thickening, or pericholecystic
fluid. Negative sonographic Murphy's sign.

Common bile duct:

Diameter: 4 mm

Liver:

Coarse hepatic echotexture with nodular hepatic contour. Hyperechoic
hepatic parenchyma. No focal hepatic lesion is seen. Portal vein is
patent on color Doppler imaging with normal direction of blood flow
towards the liver.
IMPRESSION: Coarse hepatic echotexture with hyperechoic hepatic parenchyma and
mildly nodular hepatic contour. This appearance raises the
possibility of hepatocellular disease such as hepatic steatosis
and/or cirrhosis.

No focal hepatic lesion is seen.

## 2017-07-13 ENCOUNTER — Emergency Department (HOSPITAL_COMMUNITY)
Admission: EM | Admit: 2017-07-13 | Discharge: 2017-07-13 | Disposition: A | Discharge status: Home / Self Care | Payer: 59 | Attending: Emergency Medicine | Admitting: Emergency Medicine

## 2017-07-13 ENCOUNTER — Other Ambulatory Visit: Payer: Self-pay

## 2017-07-13 ENCOUNTER — Emergency Department (HOSPITAL_COMMUNITY): Payer: 59

## 2017-07-13 ENCOUNTER — Encounter (HOSPITAL_COMMUNITY): Payer: Self-pay | Admitting: Emergency Medicine

## 2017-07-13 DIAGNOSIS — R1011 Right upper quadrant pain: Secondary | ICD-10-CM | POA: Diagnosis not present

## 2017-07-13 LAB — URINALYSIS, ROUTINE W REFLEX MICROSCOPIC
Bilirubin Urine: NEGATIVE
Glucose, UA: NEGATIVE mg/dL
Ketones, ur: NEGATIVE mg/dL
Nitrite: NEGATIVE
Protein, ur: NEGATIVE mg/dL
SPECIFIC GRAVITY, URINE: 1.015 (ref 1.005–1.030)
pH: 6 (ref 5.0–8.0)

## 2017-07-13 LAB — COMPREHENSIVE METABOLIC PANEL
ALBUMIN: 3.6 g/dL (ref 3.5–5.0)
ALT: 15 U/L (ref 14–54)
AST: 18 U/L (ref 15–41)
Alkaline Phosphatase: 54 U/L (ref 38–126)
Anion gap: 9 (ref 5–15)
BUN: 9 mg/dL (ref 6–20)
CO2: 23 mmol/L (ref 22–32)
CREATININE: 0.73 mg/dL (ref 0.44–1.00)
Calcium: 9.1 mg/dL (ref 8.9–10.3)
Chloride: 105 mmol/L (ref 101–111)
GFR calc Af Amer: >60 mL/min (ref >60)
GLUCOSE: 130 mg/dL — AB (ref 65–99)
Potassium: 3.5 mmol/L (ref 3.5–5.1)
Sodium: 137 mmol/L (ref 135–145)
Total Bilirubin: 0.5 mg/dL (ref 0.3–1.2)
Total Protein: 6.8 g/dL (ref 6.5–8.1)

## 2017-07-13 LAB — CBC
HCT: 38.1 % (ref 36.0–46.0)
Hemoglobin: 11.7 g/dL — ABNORMAL LOW (ref 12.0–15.0)
MCH: 22 pg — AB (ref 26.0–34.0)
MCHC: 30.7 g/dL (ref 30.0–36.0)
MCV: 71.5 fL — AB (ref 78.0–100.0)
PLATELETS: 333 10*3/uL (ref 150–400)
RBC: 5.33 MIL/uL — ABNORMAL HIGH (ref 3.87–5.11)
RDW: 15 % (ref 11.5–15.5)
WBC: 6.3 10*3/uL (ref 4.0–10.5)

## 2017-07-13 LAB — LIPASE, BLOOD: Lipase: 39 U/L (ref 11–51)

## 2017-07-13 LAB — I-STAT BETA HCG BLOOD, ED (MC, WL, AP ONLY)

## 2017-07-13 MED ORDER — OXYCODONE-ACETAMINOPHEN 5-325 MG PO TABS
1.0000 | ORAL_TABLET | Freq: Once | ORAL | Status: AC
  Administered 2017-07-13: 1 via ORAL
  Filled 2017-07-13: qty 1

## 2017-07-13 NOTE — ED Triage Notes (Signed)
Pt c/o 9/10 sharp pain on her right lower abd since last night, no fever or chills, pt states she is nauseated when she moves.

## 2017-07-13 NOTE — Discharge Instructions (Signed)
It was our pleasure to provide your ER care today - we hope that you feel better.  You may try taking pepcid and/or gas-x as need for symptom relief. You may take acetaminophen as need for pain.  Follow up with primary care doctor in the next 1-2 days for recheck if symptoms fail to improve/resolve.  Return to ER if worse, new symptoms, fevers, worsening or severe pain, persistent vomiting, other concern.

## 2017-07-13 NOTE — ED Provider Notes (Signed)
[facility] Provider Note   CSN: [id number] Arrival date & time: 07/13/17  1884     History   Chief Complaint Chief Complaint  Patient presents with  . Abdominal Pain    HPI [name] is a 42 y.o. female.  Patient c/o right upper abd pain since last night. Symptoms moderate, persistent. No radiation. No hx same symptoms. No hx gallstones, although notes mom had hx gallstones. Prior abd surgery is hysterectomy. No dysuria, hematuria or gu c/o. No back or flank pain. Normal appetite. No nvd. Had bm yesterday. No fever or chills. No chest pain or sob. No cough. No pleuritic pain.    The history is provided by the patient.  Abdominal Pain   Pertinent negatives include fever, diarrhea, vomiting, dysuria and headaches.    Past Medical History:  Diagnosis Date  . Uterine fibroid     There are no active problems to display for this patient.   Past Surgical History:  Procedure Laterality Date  . ABDOMINAL HYSTERECTOMY     2010  . CESAREAN SECTION     x 2    OB History    Gravida Para Term Preterm AB Living   7 5 4 1 2 5    SAB TAB Ectopic Multiple Live Births   1 1             Home Medications    Prior to Admission medications   Medication Sig Start Date End Date Taking? Authorizing Provider  polyethylene glycol (MIRALAX / GLYCOLAX) packet Take 17 g by mouth daily. 07/17/15   Okey Regal, PA-C    Family History No family history on file.  Social History Social History   Tobacco Use  . Smoking status: Never Smoker  Substance Use Topics  . Alcohol use: No  . Drug use: No     Allergies   Patient has no known allergies.   Review of Systems Review of Systems  Constitutional: Negative for fever.  HENT: Negative for sore throat.   Eyes: Negative for redness.  Respiratory: Negative for cough and shortness of breath.   Cardiovascular: Negative for chest pain.  Gastrointestinal: Positive for abdominal pain.  Negative for diarrhea and vomiting.  Endocrine: Negative for polyuria.  Genitourinary: Negative for dysuria and flank pain.  Musculoskeletal: Negative for back pain.  Skin: Negative for rash.  Neurological: Negative for headaches.  Hematological: Does not bruise/bleed easily.  Psychiatric/Behavioral: Negative for confusion.     Physical Exam Updated Vital Signs BP 128/69   Pulse 65   Temp (!) 97.5 F (36.4 C) (Oral)   Resp 18   Ht 1.715 m (5' 7.5")   Wt 98 kg (216 lb)   SpO2 100%   BMI 33.33 kg/m   Physical Exam  Constitutional: She appears well-developed and well-nourished. No distress.  HENT:  Head: Atraumatic.  Eyes: Conjunctivae are normal. No scleral icterus.  Neck: Neck supple. No tracheal deviation present.  Cardiovascular: Normal rate, regular rhythm, normal heart sounds and intact distal pulses. Exam reveals no gallop and no friction rub.  No murmur heard. Pulmonary/Chest: Effort normal and breath sounds normal. No respiratory distress.  Abdominal: Soft. Normal appearance and bowel sounds are normal. She exhibits no distension and no mass. There is no tenderness. There is no rebound and no guarding. No hernia.  Genitourinary:  Genitourinary Comments: No cva tenderness  Musculoskeletal: She exhibits no edema.  Neurological: She is alert.  Skin: Skin is warm and dry. No  rash noted.  Psychiatric: She has a normal mood and affect.  Nursing note and vitals reviewed.    ED Treatments / Results  Labs (all labs ordered are listed, but only abnormal results are displayed) Results for orders placed or performed during the hospital encounter of 07/13/17  Lipase, blood  Result Value Ref Range   Lipase 39 11 - 51 U/L  Comprehensive metabolic panel  Result Value Ref Range   Sodium 137 135 - 145 mmol/L   Potassium 3.5 3.5 - 5.1 mmol/L   Chloride 105 101 - 111 mmol/L   CO2 23 22 - 32 mmol/L   Glucose, Bld 130 (H) 65 - 99 mg/dL   BUN 9 6 - 20 mg/dL   Creatinine, Ser  0.73 0.44 - 1.00 mg/dL   Calcium 9.1 8.9 - 10.3 mg/dL   Total Protein 6.8 6.5 - 8.1 g/dL   Albumin 3.6 3.5 - 5.0 g/dL   AST 18 15 - 41 U/L   ALT 15 14 - 54 U/L   Alkaline Phosphatase 54 38 - 126 U/L   Total Bilirubin 0.5 0.3 - 1.2 mg/dL   GFR calc non Af Amer >60 >60 mL/min   GFR calc Af Amer >60 >60 mL/min   Anion gap 9 5 - 15  CBC  Result Value Ref Range   WBC 6.3 4.0 - 10.5 K/uL   RBC 5.33 (H) 3.87 - 5.11 MIL/uL   Hemoglobin 11.7 (L) 12.0 - 15.0 g/dL   HCT 38.1 36.0 - 46.0 %   MCV 71.5 (L) 78.0 - 100.0 fL   MCH 22.0 (L) 26.0 - 34.0 pg   MCHC 30.7 30.0 - 36.0 g/dL   RDW 15.0 11.5 - 15.5 %   Platelets 333 150 - 400 K/uL  Urinalysis, Routine w reflex microscopic  Result Value Ref Range   Color, Urine YELLOW YELLOW   APPearance CLEAR CLEAR   Specific Gravity, Urine 1.015 1.005 - 1.030   pH 6.0 5.0 - 8.0   Glucose, UA NEGATIVE NEGATIVE mg/dL   Hgb urine dipstick SMALL (A) NEGATIVE   Bilirubin Urine NEGATIVE NEGATIVE   Ketones, ur NEGATIVE NEGATIVE mg/dL   Protein, ur NEGATIVE NEGATIVE mg/dL   Nitrite NEGATIVE NEGATIVE   Leukocytes, UA TRACE (A) NEGATIVE   RBC / HPF 0-5 0 - 5 RBC/hpf   WBC, UA 0-5 0 - 5 WBC/hpf   Bacteria, UA RARE (A) NONE SEEN   Squamous Epithelial / LPF 0-5 (A) NONE SEEN   Mucus PRESENT   I-Stat beta hCG blood, ED  Result Value Ref Range   I-stat hCG, quantitative <5.0 <5 mIU/mL   Comment 3           US Abdomen Limited  Result Date: 07/13/2017 CLINICAL DATA:  Right upper quadrant pain, nausea EXAM: ULTRASOUND ABDOMEN LIMITED RIGHT UPPER QUADRANT COMPARISON:  None. FINDINGS: Gallbladder: No gallstones, gallbladder wall thickening, or pericholecystic fluid. Negative sonographic Murphy's sign. Common bile duct: Diameter: 4 mm Liver: Coarse hepatic echotexture with nodular hepatic contour. Hyperechoic hepatic parenchyma. No focal hepatic lesion is seen. Portal vein is patent on color Doppler imaging with normal direction of blood flow towards the liver.  IMPRESSION: Coarse hepatic echotexture with hyperechoic hepatic parenchyma and mildly nodular hepatic contour. This appearance raises the possibility of hepatocellular disease such as hepatic steatosis and/or cirrhosis. No focal hepatic lesion is seen. Electronically Signed   By: Julian Hy M.D.   On: 07/13/2017 09:13    EKG  EKG Interpretation None  Radiology US Abdomen Limited  Result Date: 07/13/2017 CLINICAL DATA:  Right upper quadrant pain, nausea EXAM: ULTRASOUND ABDOMEN LIMITED RIGHT UPPER QUADRANT COMPARISON:  None. FINDINGS: Gallbladder: No gallstones, gallbladder wall thickening, or pericholecystic fluid. Negative sonographic Murphy's sign. Common bile duct: Diameter: 4 mm Liver: Coarse hepatic echotexture with nodular hepatic contour. Hyperechoic hepatic parenchyma. No focal hepatic lesion is seen. Portal vein is patent on color Doppler imaging with normal direction of blood flow towards the liver. IMPRESSION: Coarse hepatic echotexture with hyperechoic hepatic parenchyma and mildly nodular hepatic contour. This appearance raises the possibility of hepatocellular disease such as hepatic steatosis and/or cirrhosis. No focal hepatic lesion is seen. Electronically Signed   By: Julian Hy M.D.   On: 07/13/2017 09:13    Procedures Procedures (including critical care time)  Medications Ordered in ED Medications  oxyCODONE-acetaminophen (PERCOCET/ROXICET) 5-325 MG per tablet 1 tablet (1 tablet Oral Given 07/13/17 0345)     Initial Impression / Assessment and Plan / ED Course  I have reviewed the triage vital signs and the nursing notes.  Pertinent labs & imaging results that were available during my care of the patient were reviewed by me and considered in my medical decision making (see chart for details).  Labs and imaging ordered.   Gi meds, pepcid/gi cocktail for symptom relief.   Labs reviewed, wbc normal.   Imaging studies reviewed - no gallstones.    Patients abd is soft and non tender. Afebrile. Normal appetite. No nv.   Patient currently appears stable for d/c.     Final Clinical Impressions(s) / ED Diagnoses   Final diagnoses:  None    ED Discharge Orders    None       Lajean Saver, MD 07/13/17 1019

## 2018-04-08 ENCOUNTER — Other Ambulatory Visit: Payer: Self-pay

## 2018-04-08 ENCOUNTER — Encounter (HOSPITAL_COMMUNITY): Payer: Self-pay | Admitting: *Deleted

## 2018-04-08 ENCOUNTER — Ambulatory Visit (HOSPITAL_COMMUNITY)
Admission: EM | Admit: 2018-04-08 | Discharge: 2018-04-08 | Disposition: A | Discharge status: Home / Self Care | Payer: 59 | Attending: Family Medicine | Admitting: Family Medicine

## 2018-04-08 DIAGNOSIS — J020 Streptococcal pharyngitis: Secondary | ICD-10-CM

## 2018-04-08 DIAGNOSIS — J029 Acute pharyngitis, unspecified: Secondary | ICD-10-CM | POA: Diagnosis present

## 2018-04-08 DIAGNOSIS — Z792 Long term (current) use of antibiotics: Secondary | ICD-10-CM | POA: Insufficient documentation

## 2018-04-08 MED ORDER — AMOXICILLIN 875 MG PO TABS: 875.0000 mg | ORAL_TABLET | Freq: Two times a day (BID) | ORAL | 0 refills | Status: DC

## 2018-04-08 NOTE — Discharge Instructions (Signed)
Continue the gargles with salt water or listerine

## 2018-04-08 NOTE — ED Triage Notes (Signed)
C/O starting with chills 2 days ago.  Starting yesterday c/o sore, swollen throat.  Has been taking IBU.  States several family members with strep.

## 2018-04-08 NOTE — ED Provider Notes (Signed)
[facility]    CSN: [id number] Arrival date & time: 04/08/18  1001     History   Chief Complaint Chief Complaint  Patient presents with  . Chills  . Sore Throat    HPI [name] is a 42 y.o. female.   C/O starting with chills 2 days ago.  Starting yesterday c/o sore, swollen throat.  Has been taking IBU.  States several family members with strep.     Past Medical History:  Diagnosis Date  . Uterine fibroid     There are no active problems to display for this patient.   Past Surgical History:  Procedure Laterality Date  . ABDOMINAL HYSTERECTOMY     2010  . CESAREAN SECTION     x 2    OB History    Gravida  7   Para  5   Term  4   Preterm  1   AB  2   Living  5     SAB  1   TAB  1   Ectopic      Multiple      Live Births               Home Medications    Prior to Admission medications   Medication Sig Start Date End Date Taking? Authorizing Provider  amoxicillin (AMOXIL) 875 MG tablet Take 1 tablet (875 mg total) by mouth 2 (two) times daily. 04/08/18   Robyn Haber, MD    Family History Family History  Problem Relation Age of Onset  . Cancer Mother   . HIV Father     Social History Social History   Tobacco Use  . Smoking status: Never Smoker  . Smokeless tobacco: Never Used  Substance Use Topics  . Alcohol use: No  . Drug use: No     Allergies   Patient has no known allergies.   Review of Systems Review of Systems  Constitutional: Positive for chills and diaphoresis.  HENT: Positive for ear pain and sore throat.      Physical Exam Triage Vital Signs ED Triage Vitals  Enc Vitals Group     BP 04/08/18 1010 (!) 141/72     Pulse Rate 04/08/18 1010 (!) 110     Resp 04/08/18 1010 16     Temp 04/08/18 1010 (!) 97.5 F (36.4 C)     Temp Source 04/08/18 1010 Oral     SpO2 04/08/18 1010 97 %     Weight --      Height --      Head Circumference --      Peak Flow --      Pain Score  04/08/18 1011 0     Pain Loc --      Pain Edu? --      Excl. in [place]? --    No data found.  Updated Vital Signs BP (!) 141/72   Pulse (!) 110   Temp (!) 97.5 F (36.4 C) (Oral)   Resp 16   SpO2 97%   Physical Exam  Constitutional: She is oriented to person, place, and time. She appears well-developed and well-nourished.  HENT:  Head: Normocephalic.  Right Ear: Hearing, tympanic membrane and ear canal normal.  Left Ear: Hearing, tympanic membrane and ear canal normal.  Mouth/Throat: Mucous membranes are normal. Oropharyngeal exudate, posterior oropharyngeal edema and posterior oropharyngeal erythema present.  Eyes: Pupils are equal, round, and reactive to light.  Neck: Normal range of motion.  Neck supple.  Pulmonary/Chest: Effort normal.  Neurological: She is alert and oriented to person, place, and time.  Skin: Skin is warm and dry.  Psychiatric: She has a normal mood and affect. Her behavior is normal.  Nursing note and vitals reviewed.    UC Treatments / Results  Labs (all labs ordered are listed, but only abnormal results are displayed) Labs Reviewed - No data to display  EKG None  Radiology No results found.  Procedures Procedures (including critical care time)  Medications Ordered in UC Medications - No data to display  Initial Impression / Assessment and Plan / UC Course  I have reviewed the triage vital signs and the nursing notes.  Pertinent labs & imaging results that were available during my care of the patient were reviewed by me and considered in my medical decision making (see chart for details).    Final Clinical Impressions(s) / UC Diagnoses   Final diagnoses:  Strep pharyngitis     Discharge Instructions     Continue the gargles with salt water or listerine    ED Prescriptions    Medication Sig Dispense Auth. Provider   amoxicillin (AMOXIL) 875 MG tablet Take 1 tablet (875 mg total) by mouth 2 (two) times daily. 20 tablet Robyn Haber, MD     Controlled Substance Prescriptions [place] Controlled Substance Registry consulted? Not Applicable   Robyn Haber, MD 04/08/18 1021

## 2018-04-09 LAB — POCT RAPID STREP A: Streptococcus, Group A Screen (Direct): NEGATIVE

## 2018-04-10 ENCOUNTER — Telehealth (HOSPITAL_COMMUNITY): Payer: Self-pay | Admitting: *Deleted

## 2018-04-10 LAB — CULTURE, GROUP A STREP (THRC)

## 2018-05-14 ENCOUNTER — Encounter (HOSPITAL_COMMUNITY): Payer: Self-pay

## 2018-05-14 ENCOUNTER — Ambulatory Visit (HOSPITAL_COMMUNITY)
Admission: EM | Admit: 2018-05-14 | Discharge: 2018-05-14 | Disposition: A | Discharge status: Home / Self Care | Payer: 59 | Attending: Internal Medicine | Admitting: Internal Medicine

## 2018-05-14 ENCOUNTER — Other Ambulatory Visit: Payer: Self-pay

## 2018-05-14 DIAGNOSIS — L0291 Cutaneous abscess, unspecified: Secondary | ICD-10-CM

## 2018-05-14 MED ORDER — FLUCONAZOLE 150 MG PO TABS: 150.0000 mg | ORAL_TABLET | Freq: Every day | ORAL | 0 refills | Status: DC

## 2018-05-14 MED ORDER — SULFAMETHOXAZOLE-TRIMETHOPRIM 800-160 MG PO TABS: ORAL_TABLET | ORAL | 0 refills | Status: DC

## 2018-05-14 NOTE — ED Provider Notes (Signed)
[place]    CSN: [id number] Arrival date & time: 05/14/18  1959     History   Chief Complaint Chief Complaint  Patient presents with  . Abscess    HPI [name] is a 43 y.o. female.    Pt presents saying she noticed a sore hard area under her L thorax where she has eczema rash and has been itching this area a lot. Today while at work found a new red bump and is more painful and swollen than the one on the L. She has never had abscesses before and has not been exposed to this.     Past Medical History:  Diagnosis Date  . Uterine fibroid     There are no active problems to display for this patient.   Past Surgical History:  Procedure Laterality Date  . ABDOMINAL HYSTERECTOMY     2010  . CESAREAN SECTION     x 2    OB History    Gravida  7   Para  5   Term  4   Preterm  1   AB  2   Living  5     SAB  1   TAB  1   Ectopic      Multiple      Live Births               Home Medications    Prior to Admission medications   Medication Sig Start Date End Date Taking? Authorizing Provider  fluconazole (DIFLUCAN) 150 MG tablet Take 1 tablet (150 mg total) by mouth daily. 05/14/18   Rodriguez-Southworth, Sunday Spillers, PA-C  sulfamethoxazole-trimethoprim (BACTRIM DS,SEPTRA DS) 800-160 MG tablet 2 bid x 10 days 05/14/18   Rodriguez-Southworth, Sunday Spillers, PA-C    Family History Family History  Problem Relation Age of Onset  . Cancer Mother   . HIV Father     Social History Social History   Tobacco Use  . Smoking status: Never Smoker  . Smokeless tobacco: Never Used  Substance Use Topics  . Alcohol use: No  . Drug use: No     Allergies   Patient has no known allergies.   Review of Systems Review of Systems  Constitutional: Negative for chills, diaphoresis and fever.  Respiratory: Negative for cough.   Musculoskeletal: Negative for myalgias.  Skin: Positive for rash. Negative for wound.       Abscesses as noted in hx    Hematological: Negative for adenopathy.   Physical Exam Triage Vital Signs ED Triage Vitals  Enc Vitals Group     BP 05/14/18 2039 (!) 145/90     Pulse Rate 05/14/18 2039 81     Resp 05/14/18 2039 16     Temp 05/14/18 2039 97.7 F (36.5 C)     Temp Source 05/14/18 2039 Oral     SpO2 05/14/18 2039 100 %     Weight --      Height --      Head Circumference --      Peak Flow --      Pain Score 05/14/18 2040 8     Pain Loc --      Pain Edu? --      Excl. in [place]? --    No data found.  Updated Vital Signs BP (!) 145/90 (BP Location: Right Arm)   Pulse 81   Temp 97.7 F (36.5 C) (Oral)   Resp 16   SpO2 100%   Visual Acuity  Right Eye Distance:   Left Eye Distance:   Bilateral Distance:    Right Eye Near:   Left Eye Near:    Bilateral Near:     Physical Exam Vitals signs and nursing note reviewed.  Constitutional:      General: She is not in acute distress.    Appearance: Normal appearance. She is not toxic-appearing.  HENT:     Right Ear: External ear normal.     Left Ear: External ear normal.     Nose: Nose normal.  Eyes:     General: No scleral icterus.    Conjunctiva/sclera: Conjunctivae normal.  Neck:     Musculoskeletal: Neck supple.  Pulmonary:     Effort: Pulmonary effort is normal.  Skin:    General: Skin is warm and dry.     Findings: Erythema and rash present.     Comments: Has dry skin patches over L thorax. Has a 1x 1 cm induration on L mid thorax with a small scab, this area is slightly red and tender. No fluctuation.  Has another erythematous induration of 1.5 x 1.5 cm  With scab in the center on R thorax which is moderately tender and there is no fluctuation. Seems to have swelling around this area of about 4x5 cm, but this area is not hot or red.   Neurological:     Mental Status: She is alert and oriented to person, place, and time.  Psychiatric:        Mood and Affect: Mood normal.        Behavior: Behavior normal.        Thought Content:  Thought content normal.        Judgment: Judgment normal.    UC Treatments / Results  Labs (all labs ordered are listed, but only abnormal results are displayed) Labs Reviewed - No data to display  EKG None  Radiology No results found.  Procedures Procedures   Medications Ordered in UC Medications - No data to display  Initial Impression / Assessment and Plan / UC Course  I have reviewed the triage vital signs and the nursing notes. I explained to pt the abscesses dont feel or look like they are ready to have I&D done. Since it is getting worse so rapid today, I suspect possible MRSA and placed her on double dose of bactrim as noted. Needs to apply heat and if she gets worse will need I&D done. Name of PCP who is taking pts given for FU.  May take Tylenol for pain.    Final Clinical Impressions(s) / UC Diagnoses   Final diagnoses:  Abscess     Discharge Instructions     Use heat on abscesses for 15 minutes every hour. Take Tylenol 500 mg 2 every 6 h for pain, and you may also add Ibuprofen in between this if you need more pain relief. If the area gets larger and worse, sometimes it may need to be cut open and drained if it does not drain on its own.    ED Prescriptions    Medication Sig Dispense Auth. Provider   sulfamethoxazole-trimethoprim (BACTRIM DS,SEPTRA DS) 800-160 MG tablet 2 bid x 10 days 40 tablet Rodriguez-Southworth, Kaeley Vinje, PA-C   fluconazole (DIFLUCAN) 150 MG tablet Take 1 tablet (150 mg total) by mouth daily. 2 tablet Rodriguez-Southworth, Sunday Spillers, PA-C     Controlled Substance Prescriptions [place] Controlled Substance Registry consulted?   Shelby Mattocks, Hershal Coria 05/14/18 2101

## 2018-05-14 NOTE — ED Triage Notes (Signed)
Pt has a abscess on her right side. This just came up today.

## 2018-05-14 NOTE — Discharge Instructions (Signed)
Use heat on abscesses for 15 minutes every hour. Take Tylenol 500 mg 2 every 6 h for pain, and you may also add Ibuprofen in between this if you need more pain relief. If the area gets larger and worse, sometimes it may need to be cut open and drained if it does not drain on its own.

## 2018-05-16 ENCOUNTER — Encounter (HOSPITAL_COMMUNITY): Payer: Self-pay | Admitting: Emergency Medicine

## 2018-05-16 ENCOUNTER — Other Ambulatory Visit: Payer: Self-pay

## 2018-05-16 ENCOUNTER — Ambulatory Visit (HOSPITAL_COMMUNITY)
Admission: EM | Admit: 2018-05-16 | Discharge: 2018-05-16 | Disposition: A | Discharge status: Home / Self Care | Payer: 59 | Attending: Family Medicine | Admitting: Family Medicine

## 2018-05-16 DIAGNOSIS — T3695XA Adverse effect of unspecified systemic antibiotic, initial encounter: Secondary | ICD-10-CM | POA: Insufficient documentation

## 2018-05-16 DIAGNOSIS — L02211 Cutaneous abscess of abdominal wall: Secondary | ICD-10-CM | POA: Diagnosis not present

## 2018-05-16 MED ORDER — DOXYCYCLINE HYCLATE 100 MG PO CAPS: 100.0000 mg | ORAL_CAPSULE | Freq: Two times a day (BID) | ORAL | 0 refills | Status: DC

## 2018-05-16 MED ORDER — HYDROCODONE-ACETAMINOPHEN 5-325 MG PO TABS: 1.0000 | ORAL_TABLET | Freq: Four times a day (QID) | ORAL | 0 refills | Status: DC | PRN

## 2018-05-16 NOTE — ED Triage Notes (Signed)
Seen 2 days ago, started antibiotics and using heat compresses to abscess. Today she feels sick and not sure if getting worse or medicine is causing the way she feels

## 2018-05-16 NOTE — ED Provider Notes (Addendum)
[place]   [id number] 05/16/18 Arrival Time: 1238  ASSESSMENT & PLAN:  1. Abscess of skin of abdomen   2. Adverse reaction to antibiotic, initial encounter    Incision and Drainage Procedure Note  Anesthesia: 2% plain lidocaine  Procedure Details  The procedure, risks and complications have been discussed in detail (including, but not limited to pain and bleeding) with the patient.  The skin induration was prepped and draped in the usual fashion. After adequate local anesthesia, I&D with a #11 blade was performed on the lateral right abdomen. Purulent drainage: present.  EBL: minimal Drains: packing placed Condition: Tolerated procedure well Complications: none.  Meds ordered this encounter  Medications  . HYDROcodone-acetaminophen (NORCO/VICODIN) 5-325 MG tablet    Sig: Take 1 tablet by mouth every 6 (six) hours as needed for moderate pain or severe pain.    Dispense:  8 tablet    Refill:  0  . doxycycline (VIBRAMYCIN) 100 MG capsule    Sig: Take 1 capsule (100 mg total) by mouth 2 (two) times daily.    Dispense:  20 capsule    Refill:  0   Wound care instructions discussed and given in written format. To return in 48 hours for wound check. Work note given.  No signs of an allergic reaction or anaphylaxis to Bactrim. Discussed. Stop Bactrim (secondary to "stomach upset"). Start doxycycline.  Finish all antibiotics. OTC analgesics as needed.  [place] Controlled Substances Registry consulted for this patient. I feel the risk/benefit ratio today is favorable for proceeding with this prescription for a controlled substance. Medication sedation precautions given.    Discharge Instructions     You have had an abscess drained today and you may have had packing placed in the wound to help the abscess continue to drain at home. If packing was placed, please do not remove it. You may shower with the packing in place. Let the soapy water clean your wound. Do not scrub  it. Keep your wound covered. Follow up at the Urgent Care in 2 days for a wound check and/or packing removal. Return to the Urgent Care immediately if you develop any of the following symptoms: fever, Increased redness or swelling around where your abscess was, increased pain, or generalized weakness or vomiting.  Be aware, pain medications may cause drowsiness. Please do not drive, operate heavy machinery or make important decisions while on this medication, it can cloud your judgement.     Reviewed expectations re: course of current medical issues. Questions answered. Outlined signs and symptoms indicating need for more acute intervention. Patient verbalized understanding. After Visit Summary given.   SUBJECTIVE:  [name] is a 43 y.o. female who presents with a possible abscess of her R and L abdomen. Seen here 2 days ago. Note reviewed. Placed on Bactrim which is causing "upset stomach"; cramping feeling; no diarrhea or emesis. She has continued to take. No respiratory or swallowing difficulties. No new rashes. Feels area on right lateral abdomen is worsening. No drainage or bleeding. More painful. Afebrile. Area on L abdomen is better and has spontaneously opened with continued drainage. Much less pain. Ambulatory without difficulty. No OTC treatment.  ROS: As per HPI.  OBJECTIVE:  Vitals:   05/16/18 1358  BP: 118/71  Pulse: 90  Resp: 18  Temp: 98 F (36.7 C)  TempSrc: Temporal  SpO2: 100%     General appearance: alert; no distress Oropharynx: moist and without lesions Neck: supple with FROM Lungs: CTAB; unlabored respirations  CV: RRR Skin: R lateral abdomen with approx 2 cm induration that is tender to touch; without drainage or bleeding; surrounding skin thickening with minimal erythema L lateral abdomen with approx 1 cm of slight skin thickening with central opening that is draining purulent-like material; easily expressed; no bleeding; area not very  tender Otherwise she has a benign abdominal exam. Normal bowel sounds. Neuro: gait normal Psychological: alert and cooperative; normal mood and affect  No Known Allergies  Past Medical History:  Diagnosis Date  . Uterine fibroid    Social History   Socioeconomic History  . Marital status: Single    Spouse name: Not on file  . Number of children: Not on file  . Years of education: Not on file  . Highest education level: Not on file  Occupational History  . Not on file  Social Needs  . Financial resource strain: Not on file  . Food insecurity:    Worry: Not on file    Inability: Not on file  . Transportation needs:    Medical: Not on file    Non-medical: Not on file  Tobacco Use  . Smoking status: Never Smoker  . Smokeless tobacco: Never Used  Substance and Sexual Activity  . Alcohol use: No  . Drug use: No  . Sexual activity: Not on file  Lifestyle  . Physical activity:    Days per week: Not on file    Minutes per session: Not on file  . Stress: Not on file  Relationships  . Social connections:    Talks on phone: Not on file    Gets together: Not on file    Attends religious service: Not on file    Active member of club or organization: Not on file    Attends meetings of clubs or organizations: Not on file    Relationship status: Not on file  Other Topics Concern  . Not on file  Social History Narrative  . Not on file   Family History  Problem Relation Age of Onset  . Cancer Mother   . HIV Father    Past Surgical History:  Procedure Laterality Date  . ABDOMINAL HYSTERECTOMY     2010  . CESAREAN SECTION     x 2           Vanessa Kick, MD 05/17/18 1039    Vanessa Kick, MD 05/17/18 [phone]

## 2018-05-16 NOTE — Discharge Instructions (Addendum)
You have had an abscess drained today and you may have had packing placed in the wound to help the abscess continue to drain at home. If packing was placed, please do not remove it. You may shower with the packing in place. Let the soapy water clean your wound. Do not scrub it. Keep your wound covered. Follow up at the Urgent Care in 2 days for a wound check and/or packing removal. Return to the Urgent Care immediately if you develop any of the following symptoms: fever, Increased redness or swelling around where your abscess was, increased pain, or generalized weakness or vomiting.  Be aware, pain medications may cause drowsiness. Please do not drive, operate heavy machinery or make important decisions while on this medication, it can cloud your judgement.

## 2018-05-18 ENCOUNTER — Encounter (HOSPITAL_COMMUNITY): Payer: Self-pay

## 2018-05-18 ENCOUNTER — Ambulatory Visit (HOSPITAL_COMMUNITY)
Admission: EM | Admit: 2018-05-18 | Discharge: 2018-05-18 | Disposition: A | Discharge status: Home / Self Care | Payer: 59 | Attending: Internal Medicine | Admitting: Internal Medicine

## 2018-05-18 DIAGNOSIS — L03319 Cellulitis of trunk, unspecified: Secondary | ICD-10-CM | POA: Insufficient documentation

## 2018-05-18 DIAGNOSIS — L02219 Cutaneous abscess of trunk, unspecified: Secondary | ICD-10-CM | POA: Insufficient documentation

## 2018-05-18 DIAGNOSIS — R112 Nausea with vomiting, unspecified: Secondary | ICD-10-CM

## 2018-05-18 MED ORDER — ONDANSETRON HCL 8 MG PO TABS: ORAL_TABLET | ORAL | 0 refills | Status: DC

## 2018-05-18 NOTE — ED Provider Notes (Signed)
[name]    CSN: [id number] Arrival date & time: 05/18/18  0932     History   Chief Complaint Chief Complaint  Patient presents with  . Appointment  . (10:15) Follow Up    HPI [name] is a 43 y.o. female.   Pt is here for dressing change of R thorax abscess for which she had I&D yesterday. She vomited her food this am after taking the Doxy. Took 2 doses yesterday and was fine. Is not nauseous right now. Has changed the dressing on R abscess x 3 yesterday. The other small ones on L thorax and L abd are draining on their own. She did take a dose of her pain med this am.      Past Medical History:  Diagnosis Date  . Uterine fibroid     There are no active problems to display for this patient.   Past Surgical History:  Procedure Laterality Date  . ABDOMINAL HYSTERECTOMY     2010  . CESAREAN SECTION     x 2    OB History    Gravida  7   Para  5   Term  4   Preterm  1   AB  2   Living  5     SAB  1   TAB  1   Ectopic      Multiple      Live Births               Home Medications    Prior to Admission medications   Medication Sig Start Date End Date Taking? Authorizing Provider  doxycycline (VIBRAMYCIN) 100 MG capsule Take 1 capsule (100 mg total) by mouth 2 (two) times daily. 05/16/18   Vanessa Kick, MD  fluconazole (DIFLUCAN) 150 MG tablet Take 1 tablet (150 mg total) by mouth daily. 05/14/18   Rodriguez-Southworth, Sunday Spillers, PA-C  HYDROcodone-acetaminophen (NORCO/VICODIN) 5-325 MG tablet Take 1 tablet by mouth every 6 (six) hours as needed for moderate pain or severe pain. 05/16/18   Vanessa Kick, MD  ibuprofen (ADVIL,MOTRIN) 400 MG tablet Take 400 mg by mouth every 6 (six) hours as needed.    [provider]    Family History Family History  Problem Relation Age of Onset  . Cancer Mother   . HIV Father     Social History Social History   Tobacco Use  . Smoking status: Never Smoker  . Smokeless tobacco:  Never Used  Substance Use Topics  . Alcohol use: No  . Drug use: No     Allergies   Patient has no known allergies.   Review of Systems Review of Systems  Constitutional: Negative for chills and fever.  Gastrointestinal: Positive for nausea and vomiting. Negative for abdominal pain.       See HPI  Genitourinary:       Does not have periods anymore since she had a hysterectomy  Skin: Positive for wound. Negative for rash.       See HPI   Physical Exam Triage Vital Signs ED Triage Vitals  Enc Vitals Group     BP 05/18/18 0947 123/79     Pulse Rate 05/18/18 0947 80     Resp 05/18/18 0947 18     Temp 05/18/18 0947 97.7 F (36.5 C)     Temp Source 05/18/18 0947 Oral     SpO2 05/18/18 0947 99 %     Weight --      Height --  Head Circumference --      Peak Flow --      Pain Score 05/18/18 0951 3     Pain Loc --      Pain Edu? --      Excl. in [place]? --    No data found.  Updated Vital Signs BP 123/79 (BP Location: Left Arm)   Pulse 80   Temp 97.7 F (36.5 C) (Oral)   Resp 18   SpO2 99%   Visual Acuity Right Eye Distance:   Left Eye Distance:   Bilateral Distance:    Right Eye Near:   Left Eye Near:    Bilateral Near:     Physical Exam Vitals signs and nursing note reviewed.  Constitutional:      General: She is not in acute distress.    Appearance: Normal appearance.  HENT:     Right Ear: External ear normal.     Nose: Nose normal.  Eyes:     General: No scleral icterus.    Conjunctiva/sclera: Conjunctivae normal.  Neck:     Musculoskeletal: Neck supple.  Pulmonary:     Effort: Pulmonary effort is normal.  Skin:    General: Skin is warm and dry.     Coloration: Skin is not jaundiced.     Comments: R THORAX- with small amount of yellow drainage on the Band-Aid, has large induration surrounding the opening of about 4x5 cm, with mild redness.  I removed the packing, I irrigated the opening with 10 ml saline, dried area well and repacked it with  1/4 inch iodoform pack. New dressing applied. Pt given her bottle of packing to bring it tomorrow. Pt was in moderate pain, but was able to withstand procedure without need to numb.   Neurological:     Mental Status: She is alert.      UC Treatments / Results  Labs (all labs ordered are listed, but only abnormal results are displayed) Labs Reviewed - No data to display  EKG None  Radiology No results found.  Procedures As noted in exam area  Medications Ordered in UC Medications - No data to display  Initial Impression / Assessment and Plan / UC Course  I have reviewed the triage vital signs and the nursing notes. I placed her on Zofran 8 mg to try to take it 30 mg before she takes the Doxy and see if this prevents recurrent nausea.  FU tomorrow for packing change.  Final Clinical Impressions(s) / UC Diagnoses   Final diagnoses:  None   Discharge Instructions   None    ED Prescriptions    None     Controlled Substance Prescriptions [place] Controlled Substance Registry consulted?    Shelby Mattocks, PA-C 05/18/18 1041

## 2018-05-18 NOTE — ED Triage Notes (Signed)
Pt presents for follow up to have packing removed from wound where abscess was.

## 2018-05-18 NOTE — Discharge Instructions (Signed)
Keep on applying heat over the abscess area as educated the first time.   Come back tomorrow for dressing change

## 2018-05-19 ENCOUNTER — Ambulatory Visit (HOSPITAL_COMMUNITY)
Admission: EM | Admit: 2018-05-19 | Discharge: 2018-05-19 | Disposition: A | Discharge status: Home / Self Care | Payer: 59 | Attending: Family Medicine | Admitting: Family Medicine

## 2018-05-19 ENCOUNTER — Encounter (HOSPITAL_COMMUNITY): Payer: Self-pay | Admitting: *Deleted

## 2018-05-19 DIAGNOSIS — Z4800 Encounter for change or removal of nonsurgical wound dressing: Secondary | ICD-10-CM

## 2018-05-19 DIAGNOSIS — L02211 Cutaneous abscess of abdominal wall: Secondary | ICD-10-CM

## 2018-05-19 DIAGNOSIS — Z5189 Encounter for other specified aftercare: Secondary | ICD-10-CM | POA: Insufficient documentation

## 2018-05-19 NOTE — ED Provider Notes (Signed)
[name]    CSN: [id number] Arrival date & time: 05/19/18  1052     History   Chief Complaint Chief Complaint  Patient presents with  . Appointment    11:15  . Wound Check    HPI [name] is a 43 y.o. female no contributing past medical history presenting today for wound check of abscess and packing removal.  Patient had I&D of abscess earlier this week.  She has been seen here 3 times over this past week for the same issue.  She initially was taking Bactrim but was switched to doxycycline.  Patient has been out of work due to this abscess.  Overall things are starting to improve.  She still has some mild nausea.  HPI  Past Medical History:  Diagnosis Date  . Uterine fibroid     There are no active problems to display for this patient.   Past Surgical History:  Procedure Laterality Date  . ABDOMINAL HYSTERECTOMY     2010  . CESAREAN SECTION     x 2    OB History    Gravida  7   Para  5   Term  4   Preterm  1   AB  2   Living  5     SAB  1   TAB  1   Ectopic      Multiple      Live Births               Home Medications    Prior to Admission medications   Medication Sig Start Date End Date Taking? Authorizing Provider  doxycycline (VIBRAMYCIN) 100 MG capsule Take 1 capsule (100 mg total) by mouth 2 (two) times daily. 05/16/18  Yes Vanessa Kick, MD  fluconazole (DIFLUCAN) 150 MG tablet Take 1 tablet (150 mg total) by mouth daily. 05/14/18  Yes Rodriguez-Southworth, Sunday Spillers, PA-C  HYDROcodone-acetaminophen (NORCO/VICODIN) 5-325 MG tablet Take 1 tablet by mouth every 6 (six) hours as needed for moderate pain or severe pain. 05/16/18  Yes Hagler, Aaron Edelman, MD  ibuprofen (ADVIL,MOTRIN) 400 MG tablet Take 400 mg by mouth every 6 (six) hours as needed.    [provider]  ondansetron (ZOFRAN) 8 MG tablet Take one 30 minutes before taking the Doxycycline for nausea. 05/18/18   Rodriguez-Southworth, Sunday Spillers, PA-C    Family  History Family History  Problem Relation Age of Onset  . Cancer Mother   . HIV Father     Social History Social History   Tobacco Use  . Smoking status: Never Smoker  . Smokeless tobacco: Never Used  Substance Use Topics  . Alcohol use: No  . Drug use: No     Allergies   Patient has no known allergies.   Review of Systems Review of Systems  Constitutional: Negative for fatigue and fever.  Eyes: Negative for visual disturbance.  Respiratory: Negative for shortness of breath.   Cardiovascular: Negative for chest pain.  Gastrointestinal: Positive for nausea. Negative for abdominal pain and vomiting.  Musculoskeletal: Negative for arthralgias and joint swelling.  Skin: Positive for color change and wound. Negative for rash.  Neurological: Negative for dizziness, weakness, light-headedness and headaches.     Physical Exam Triage Vital Signs ED Triage Vitals  Enc Vitals Group     BP 05/19/18 1109 122/74     Pulse Rate 05/19/18 1109 94     Resp 05/19/18 1109 16     Temp 05/19/18 1109 97.9 F (36.6 C)  Temp Source 05/19/18 1109 Temporal     SpO2 05/19/18 1109 100 %     Weight --      Height --      Head Circumference --      Peak Flow --      Pain Score 05/19/18 1110 0     Pain Loc --      Pain Edu? --      Excl. in [place]? --    No data found.  Updated Vital Signs BP 122/74   Pulse 94   Temp 97.9 F (36.6 C) (Temporal)   Resp 16   SpO2 100%   Visual Acuity Right Eye Distance:   Left Eye Distance:   Bilateral Distance:    Right Eye Near:   Left Eye Near:    Bilateral Near:     Physical Exam Vitals signs and nursing note reviewed.  Constitutional:      Appearance: She is well-developed.     Comments: No acute distress  HENT:     Head: Normocephalic and atraumatic.     Nose: Nose normal.  Eyes:     Conjunctiva/sclera: Conjunctivae normal.  Neck:     Musculoskeletal: Neck supple.  Cardiovascular:     Rate and Rhythm: Normal rate.    Pulmonary:     Effort: Pulmonary effort is normal. No respiratory distress.  Abdominal:     General: There is no distension.  Musculoskeletal: Normal range of motion.  Skin:    General: Skin is warm and dry.     Comments: 1 cm open wound to right flank, packing removed, purulent drainage still present, small amount of induration still present around opening  Neurological:     Mental Status: She is alert and oriented to person, place, and time.      UC Treatments / Results  Labs (all labs ordered are listed, but only abnormal results are displayed) Labs Reviewed - No data to display  EKG None  Radiology No results found.  Procedures Procedures (including critical care time)  Medications Ordered in UC Medications - No data to display  Initial Impression / Assessment and Plan / UC Course  I have reviewed the triage vital signs and the nursing notes.  Pertinent labs & imaging results that were available during my care of the patient were reviewed by me and considered in my medical decision making (see chart for details).    Packing removed, continue doxycycline, warm compresses/massage, Discussed strict return precautions. Patient verbalized understanding and is agreeable with plan.   Final Clinical Impressions(s) / UC Diagnoses   Final diagnoses:  Wound check, abscess     Discharge Instructions     Continue warm compresess Follow up if symptoms returning   ED Prescriptions    None     Controlled Substance Prescriptions [place] Controlled Substance Registry consulted? Not Applicable   [name], [place] 05/19/18 1357

## 2018-05-19 NOTE — ED Triage Notes (Signed)
Pt presents for packing removal for back abscess.  C/O some nausea and stomach pain.  Has been taking abx as directed.

## 2018-05-19 NOTE — Discharge Instructions (Signed)
Continue warm compresess Follow up if symptoms returning

## 2018-05-29 ENCOUNTER — Ambulatory Visit: Payer: Managed Care, Other (non HMO) | Admitting: Family Medicine

## 2018-06-06 ENCOUNTER — Encounter (HOSPITAL_COMMUNITY): Payer: Self-pay | Admitting: Emergency Medicine

## 2018-06-06 ENCOUNTER — Ambulatory Visit (HOSPITAL_COMMUNITY)
Admission: EM | Admit: 2018-06-06 | Discharge: 2018-06-06 | Disposition: A | Discharge status: Home / Self Care | Payer: 59 | Attending: Family Medicine | Admitting: Family Medicine

## 2018-06-06 ENCOUNTER — Other Ambulatory Visit: Payer: Self-pay

## 2018-06-06 DIAGNOSIS — B9789 Other viral agents as the cause of diseases classified elsewhere: Secondary | ICD-10-CM

## 2018-06-06 DIAGNOSIS — J069 Acute upper respiratory infection, unspecified: Secondary | ICD-10-CM | POA: Diagnosis not present

## 2018-06-06 MED ORDER — HYDROCODONE-HOMATROPINE 5-1.5 MG/5ML PO SYRP: 5.0000 mL | ORAL_SOLUTION | Freq: Four times a day (QID) | ORAL | 0 refills | Status: DC | PRN

## 2018-06-06 NOTE — ED Provider Notes (Signed)
[place]    CSN: [id number] Arrival date & time: 06/06/18  1935     History   Chief Complaint Chief Complaint  Patient presents with  . Cough    HPI [name] is a 43 y.o. female.   Patient's grandson coughed in her face last night and now she has a cough with headache and myalgias.  Has not really had any significant fever and tonight is afebrile.  She is taking Mucinex with some relief of symptomss  HPI  Past Medical History:  Diagnosis Date  . Uterine fibroid     There are no active problems to display for this patient.   Past Surgical History:  Procedure Laterality Date  . ABDOMINAL HYSTERECTOMY     2010  . CESAREAN SECTION     x 2    OB History    Gravida  7   Para  5   Term  4   Preterm  1   AB  2   Living  5     SAB  1   TAB  1   Ectopic      Multiple      Live Births               Home Medications    Prior to Admission medications   Medication Sig Start Date End Date Taking? Authorizing Provider  ibuprofen (ADVIL,MOTRIN) 400 MG tablet Take 400 mg by mouth every 6 (six) hours as needed.   Yes [provider]    Family History Family History  Problem Relation Age of Onset  . Cancer Mother   . HIV Father     Social History Social History   Tobacco Use  . Smoking status: Never Smoker  . Smokeless tobacco: Never Used  Substance Use Topics  . Alcohol use: No  . Drug use: No     Allergies   Patient has no known allergies.   Review of Systems Review of Systems  Constitutional: Positive for fatigue.  Respiratory: Positive for cough.   Musculoskeletal: Positive for myalgias.  Neurological: Positive for headaches.  All other systems reviewed and are negative.    Physical Exam Triage Vital Signs ED Triage Vitals  Enc Vitals Group     BP 06/06/18 2004 (!) 142/94     Pulse Rate 06/06/18 2004 (!) 107     Resp 06/06/18 2004 18     Temp 06/06/18 2004 98.5 F (36.9 C)     Temp Source  06/06/18 2004 Oral     SpO2 06/06/18 2004 97 %     Weight --      Height --      Head Circumference --      Peak Flow --      Pain Score 06/06/18 2003 6     Pain Loc --      Pain Edu? --      Excl. in [place]? --    No data found.  Updated Vital Signs BP (!) 142/94 (BP Location: Left Arm)   Pulse (!) 107   Temp 98.5 F (36.9 C) (Oral)   Resp 18   SpO2 97%   Visual Acuity Right Eye Distance:   Left Eye Distance:   Bilateral Distance:    Right Eye Near:   Left Eye Near:    Bilateral Near:     Physical Exam HENT:     Right Ear: Tympanic membrane normal.     Left Ear: Tympanic  membrane normal.     Nose: Nose normal.     Mouth/Throat:     Pharynx: Oropharynx is clear.  Neck:     Musculoskeletal: Normal range of motion.  Cardiovascular:     Rate and Rhythm: Normal rate and regular rhythm.     Heart sounds: Normal heart sounds.  Pulmonary:     Effort: Pulmonary effort is normal.     Breath sounds: Normal breath sounds.  Neurological:     General: No focal deficit present.     Mental Status: She is alert and oriented to person, place, and time.      UC Treatments / Results  Labs (all labs ordered are listed, but only abnormal results are displayed) Labs Reviewed - No data to display  EKG None  Radiology No results found.  Procedures Procedures (including critical care time)  Medications Ordered in UC Medications - No data to display  Initial Impression / Assessment and Plan / UC Course  I have reviewed the triage vital signs and the nursing notes.  Pertinent labs & imaging results that were available during my care of the patient were reviewed by me and considered in my medical decision making (see chart for details).     Viral URI with cough Final Clinical Impressions(s) / UC Diagnoses   Final diagnoses:  None   Discharge Instructions   None    ED Prescriptions    None     Controlled Substance Prescriptions [place] Controlled Substance  Registry consulted? Yes, I have consulted the [place] Controlled Substances Registry for this patient, and feel the risk/benefit ratio today is favorable for proceeding with this prescription for a controlled substance.   Wardell Honour, MD 06/06/18 2014

## 2018-06-06 NOTE — ED Triage Notes (Signed)
The patient presented to the [facility] with a complaint of a cough, headache and general body aches that started this am.

## 2018-08-05 ENCOUNTER — Ambulatory Visit (HOSPITAL_COMMUNITY)
Admission: EM | Admit: 2018-08-05 | Discharge: 2018-08-05 | Disposition: A | Discharge status: Home / Self Care | Payer: 59 | Attending: Family Medicine | Admitting: Family Medicine

## 2018-08-05 ENCOUNTER — Other Ambulatory Visit: Payer: Self-pay

## 2018-08-05 ENCOUNTER — Encounter (HOSPITAL_COMMUNITY): Payer: Self-pay | Admitting: *Deleted

## 2018-08-05 DIAGNOSIS — L0231 Cutaneous abscess of buttock: Secondary | ICD-10-CM

## 2018-08-05 MED ORDER — CEPHALEXIN 500 MG PO CAPS: 500.0000 mg | ORAL_CAPSULE | Freq: Four times a day (QID) | ORAL | 0 refills | Status: DC

## 2018-08-05 MED ORDER — FLUCONAZOLE 150 MG PO TABS: 150.0000 mg | ORAL_TABLET | Freq: Every day | ORAL | 0 refills | Status: DC

## 2018-08-05 MED ORDER — IBUPROFEN 800 MG PO TABS: 800.0000 mg | ORAL_TABLET | Freq: Three times a day (TID) | ORAL | 0 refills | Status: DC

## 2018-08-05 NOTE — ED Triage Notes (Signed)
Reports starting with "boil" to left low back approx 3 days ago.  States pain "feels like it's pinching a nerve; pain is going down my leg".  Denies fevers.

## 2018-08-05 NOTE — ED Provider Notes (Signed)
[place]    CSN: [id number] Arrival date & time: 08/05/18  1036     History   Chief Complaint Chief Complaint  Patient presents with  . Abscess    HPI [name] is a 43 y.o. female.   HPI  She has recurring abscesses.  Currently has one on the left upper buttock region.  She states that she thinks is causing pain to go down her leg.  She has chronic back pain and she states it feels like "sciatica.  The second sciatica comes and goes.  No fever chills.  No trouble with gait or walking.  No numbness or weakness in the leg.  No bowel or bladder problems.  She is been trying warm compresses.  She is seeing some drainage.  She is here to find out if it needs to be lanced.  Past Medical History:  Diagnosis Date  . Uterine fibroid     Patient Active Problem List   Diagnosis Date Noted  . Viral URI with cough 06/06/2018    Past Surgical History:  Procedure Laterality Date  . ABDOMINAL HYSTERECTOMY     2010  . CESAREAN SECTION     x 2    OB History    Gravida  7   Para  5   Term  4   Preterm  1   AB  2   Living  5     SAB  1   TAB  1   Ectopic      Multiple      Live Births               Home Medications    Prior to Admission medications   Medication Sig Start Date End Date Taking? Authorizing Provider  LORATADINE PO Take by mouth.   Yes [provider]  cephALEXin (KEFLEX) 500 MG capsule Take 1 capsule (500 mg total) by mouth 4 (four) times daily. 08/05/18   Raylene Everts, MD  fluconazole (DIFLUCAN) 150 MG tablet Take 1 tablet (150 mg total) by mouth daily. Repeat in 1 week if needed 08/05/18   Raylene Everts, MD  ibuprofen (ADVIL,MOTRIN) 800 MG tablet Take 1 tablet (800 mg total) by mouth 3 (three) times daily. 08/05/18   Raylene Everts, MD    Family History Family History  Problem Relation Age of Onset  . Cancer Mother   . HIV Father     Social History Social History   Tobacco Use  . Smoking  status: Never Smoker  . Smokeless tobacco: Never Used  Substance Use Topics  . Alcohol use: No  . Drug use: No     Allergies   Patient has no known allergies.   Review of Systems Review of Systems  Constitutional: Negative for chills and fever.  HENT: Negative for ear pain and sore throat.   Eyes: Negative for pain and visual disturbance.  Respiratory: Negative for cough and shortness of breath.   Cardiovascular: Negative for chest pain and palpitations.  Gastrointestinal: Negative for abdominal pain and vomiting.  Genitourinary: Negative for dysuria and hematuria.  Musculoskeletal: Positive for back pain. Negative for arthralgias.  Skin: Positive for wound. Negative for color change and rash.  Neurological: Negative for seizures and syncope.  All other systems reviewed and are negative.    Physical Exam Triage Vital Signs ED Triage Vitals [08/05/18 1107]  Enc Vitals Group     BP (!) 142/78     Pulse Rate Marland Kitchen)  108     Resp 18     Temp 98.7 F (37.1 C)     Temp Source Oral     SpO2 100 %     Weight      Height      Head Circumference      Peak Flow      Pain Score 8     Pain Loc      Pain Edu?      Excl. in [place]?    No data found.  Updated Vital Signs BP (!) 142/78   Pulse (!) 108   Temp 98.7 F (37.1 C) (Oral)   Resp 18   SpO2 100%      Physical Exam Constitutional:      General: She is not in acute distress.    Appearance: She is well-developed.  HENT:     Head: Normocephalic and atraumatic.  Eyes:     Conjunctiva/sclera: Conjunctivae normal.     Pupils: Pupils are equal, round, and reactive to light.  Neck:     Musculoskeletal: Normal range of motion.  Cardiovascular:     Rate and Rhythm: Normal rate and regular rhythm.     Heart sounds: Normal heart sounds.  Pulmonary:     Effort: Pulmonary effort is normal. No respiratory distress.     Breath sounds: Normal breath sounds.  Abdominal:     General: There is no distension.     Palpations:  Abdomen is soft.  Musculoskeletal: Normal range of motion.     Lumbar back: She exhibits normal range of motion, no tenderness and no bony tenderness.       Back:  Skin:    General: Skin is warm and dry.  Neurological:     Mental Status: She is alert.    Patient has normal strength sensation range of motion and reflexes in both lower extremities.  Normal gait  UC Treatments / Results  Labs (all labs ordered are listed, but only abnormal results are displayed) Labs Reviewed - No data to display  EKG None  Radiology No results found.  Procedures Procedures (including critical care time)  Medications Ordered in UC Medications - No data to display  Initial Impression / Assessment and Plan / UC Course  I have reviewed the triage vital signs and the nursing notes.  Pertinent labs & imaging results that were available during my care of the patient were reviewed by me and considered in my medical decision making (see chart for details).     Discussed the patient has an indurated area that is likely infection.  I do not see any indication for I&D at this time.  She is advised to come back if she feels this is needed. Final Clinical Impressions(s) / UC Diagnoses   Final diagnoses:  Abscess of buttock, right     Discharge Instructions     Take the ibuprofen 3 x a day with food Take the antibiotic as directed Warm compresses Return if you fail to see improvement in a couple of days   ED Prescriptions    Medication Sig Dispense Auth. Provider   ibuprofen (ADVIL,MOTRIN) 800 MG tablet Take 1 tablet (800 mg total) by mouth 3 (three) times daily. 21 tablet Raylene Everts, MD   cephALEXin (KEFLEX) 500 MG capsule Take 1 capsule (500 mg total) by mouth 4 (four) times daily. 28 capsule Raylene Everts, MD   fluconazole (DIFLUCAN) 150 MG tablet Take 1 tablet (150 mg total) by mouth  daily. Repeat in 1 week if needed 2 tablet Raylene Everts, MD     Controlled Substance  Prescriptions [place] Controlled Substance Registry consulted? Not Applicable   Raylene Everts, MD 08/05/18 1139

## 2018-08-05 NOTE — Discharge Instructions (Addendum)
Take the ibuprofen 3 x a day with food Take the antibiotic as directed Warm compresses Return if you fail to see improvement in a couple of days

## 2019-01-09 ENCOUNTER — Other Ambulatory Visit: Payer: Self-pay

## 2019-01-09 ENCOUNTER — Emergency Department (HOSPITAL_COMMUNITY): Payer: 59

## 2019-01-09 ENCOUNTER — Emergency Department (HOSPITAL_COMMUNITY)
Admission: EM | Admit: 2019-01-09 | Discharge: 2019-01-09 | Disposition: A | Discharge status: Home / Self Care | Payer: 59 | Attending: Emergency Medicine | Admitting: Emergency Medicine

## 2019-01-09 ENCOUNTER — Encounter (HOSPITAL_COMMUNITY): Payer: Self-pay

## 2019-01-09 DIAGNOSIS — Y92002 Bathroom of unspecified non-institutional (private) residence single-family (private) house as the place of occurrence of the external cause: Secondary | ICD-10-CM | POA: Insufficient documentation

## 2019-01-09 DIAGNOSIS — S59902A Unspecified injury of left elbow, initial encounter: Secondary | ICD-10-CM | POA: Diagnosis present

## 2019-01-09 DIAGNOSIS — Y999 Unspecified external cause status: Secondary | ICD-10-CM | POA: Diagnosis not present

## 2019-01-09 DIAGNOSIS — W182XXA Fall in (into) shower or empty bathtub, initial encounter: Secondary | ICD-10-CM | POA: Insufficient documentation

## 2019-01-09 DIAGNOSIS — Y93E1 Activity, personal bathing and showering: Secondary | ICD-10-CM | POA: Insufficient documentation

## 2019-01-09 DIAGNOSIS — S46912A Strain of unspecified muscle, fascia and tendon at shoulder and upper arm level, left arm, initial encounter: Secondary | ICD-10-CM | POA: Diagnosis not present

## 2019-01-09 DIAGNOSIS — S5002XA Contusion of left elbow, initial encounter: Secondary | ICD-10-CM | POA: Insufficient documentation

## 2019-01-09 IMAGING — CR DG ELBOW COMPLETE 3+V*L*
5 series · 5 of 5 positions shown · non-contrast
Comparison: None.

CLINICAL DATA: Acute LEFT elbow pain following fall. Initial
encounter.

EXAM:
LEFT ELBOW - COMPLETE 3+ VIEW

[x elbow lat left]
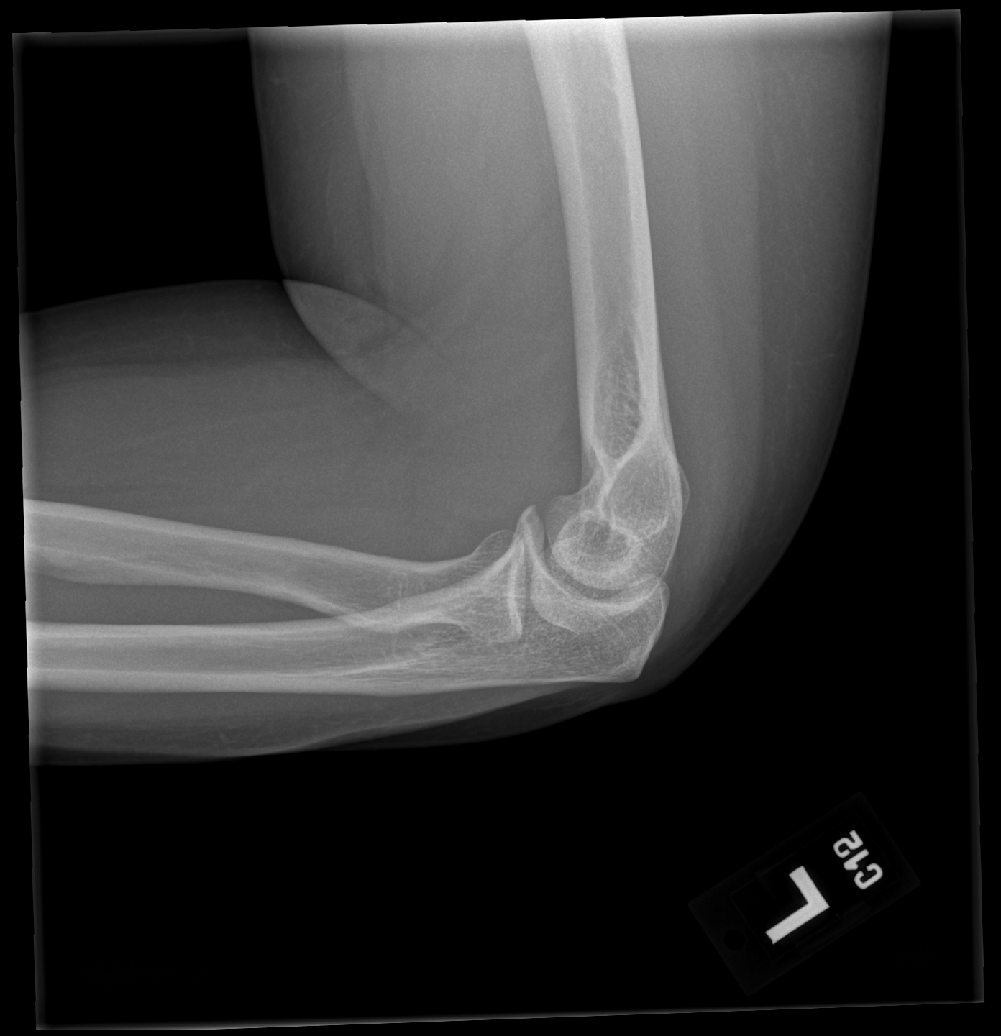

[x elbow obl left (1 of 2)]
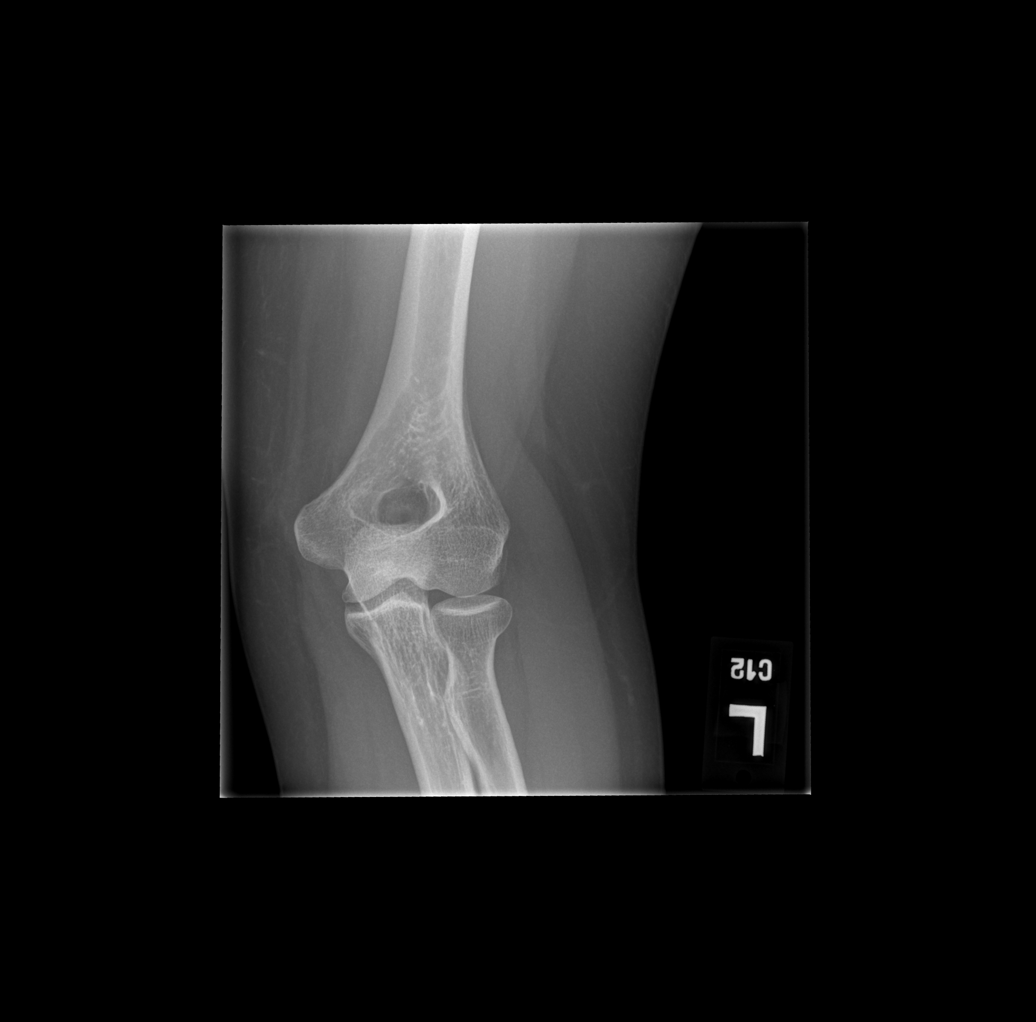

[x elbow ap left]
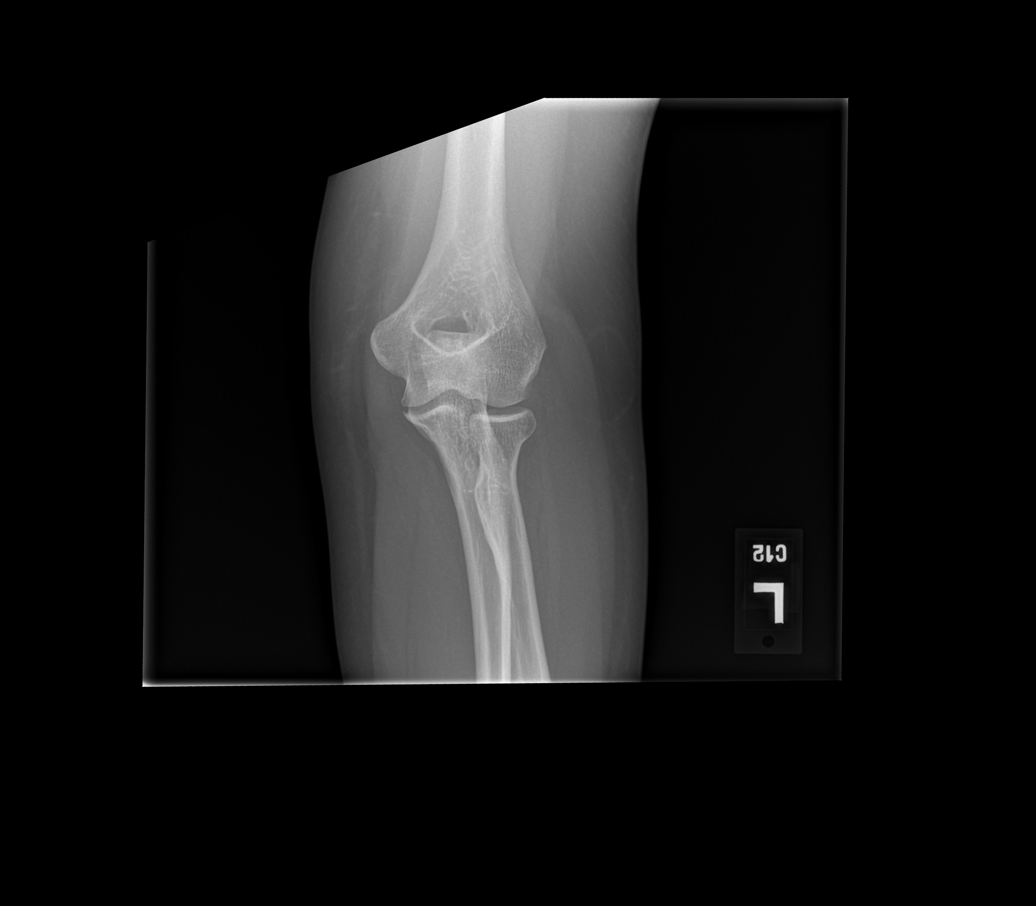

[x elbow obl left (2 of 2)]
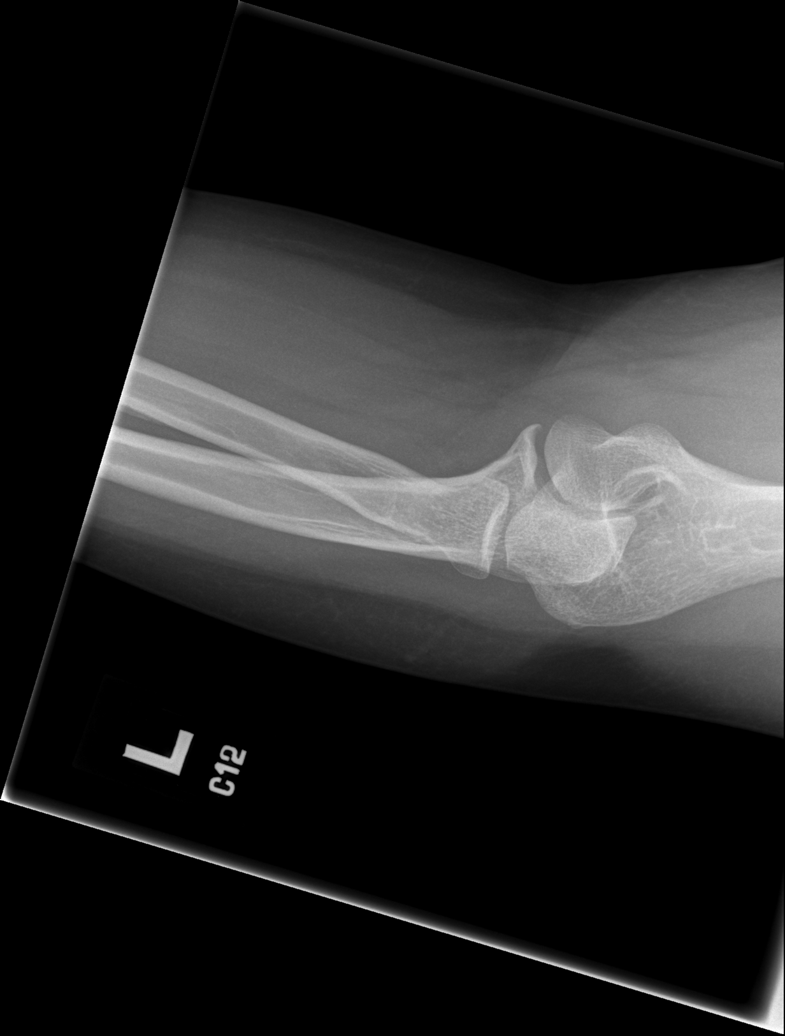

[x elbow left 0-3yrs]
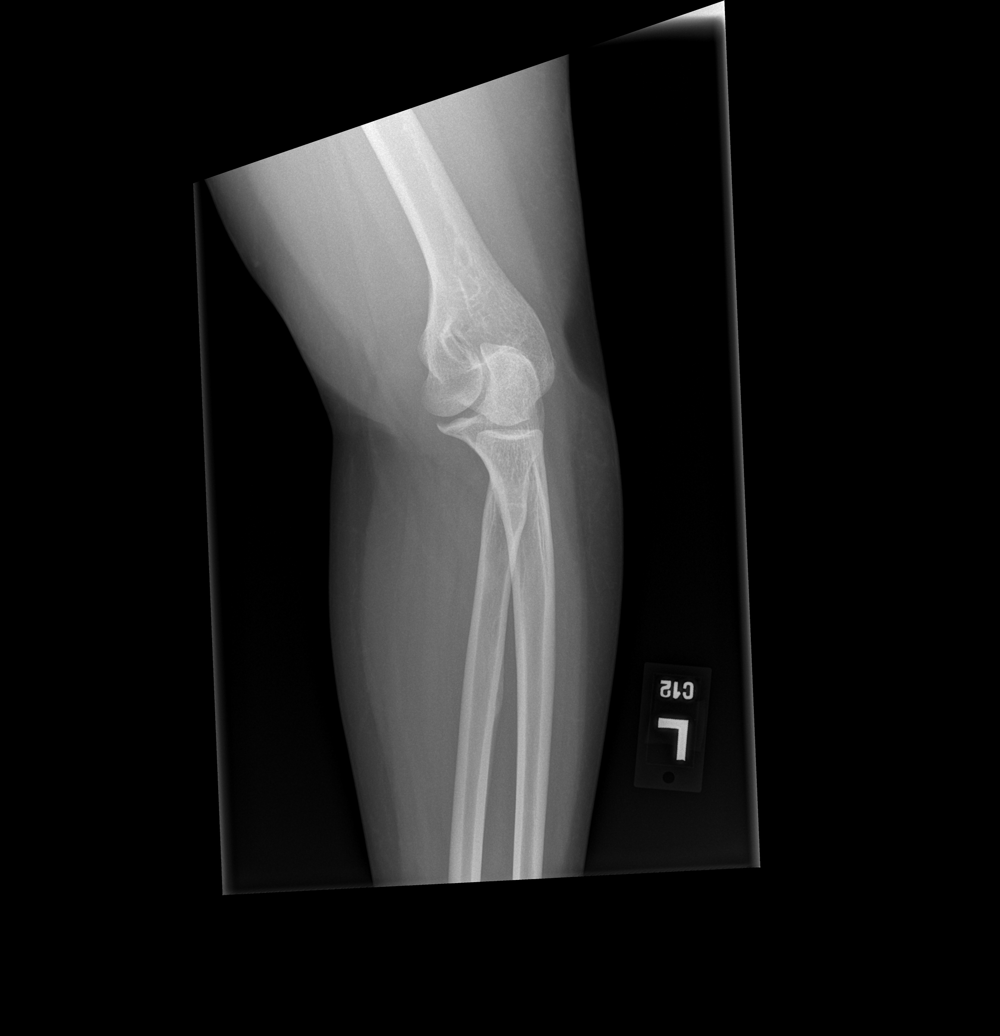

[5 of 5 positions shown; findings below may reference images not displayed]

FINDINGS: A small elbow effusion is noted.

No acute fracture, subluxation or dislocation noted.

No focal bony lesions are present.

Minimal degenerative change at the LATERAL elbow joint noted.
IMPRESSION: Small elbow effusion without acute bony abnormality identified.
Consider clinical/radiographic follow-up.

## 2019-01-09 IMAGING — CR DG SHOULDER 2+V*L*
3 series · 3 of 3 positions shown · non-contrast
Comparison: None.

CLINICAL DATA: Acute LEFT shoulder pain following fall. Initial
encounter.

EXAM:
LEFT SHOULDER - 2+ VIEW

[w shoulder external left]
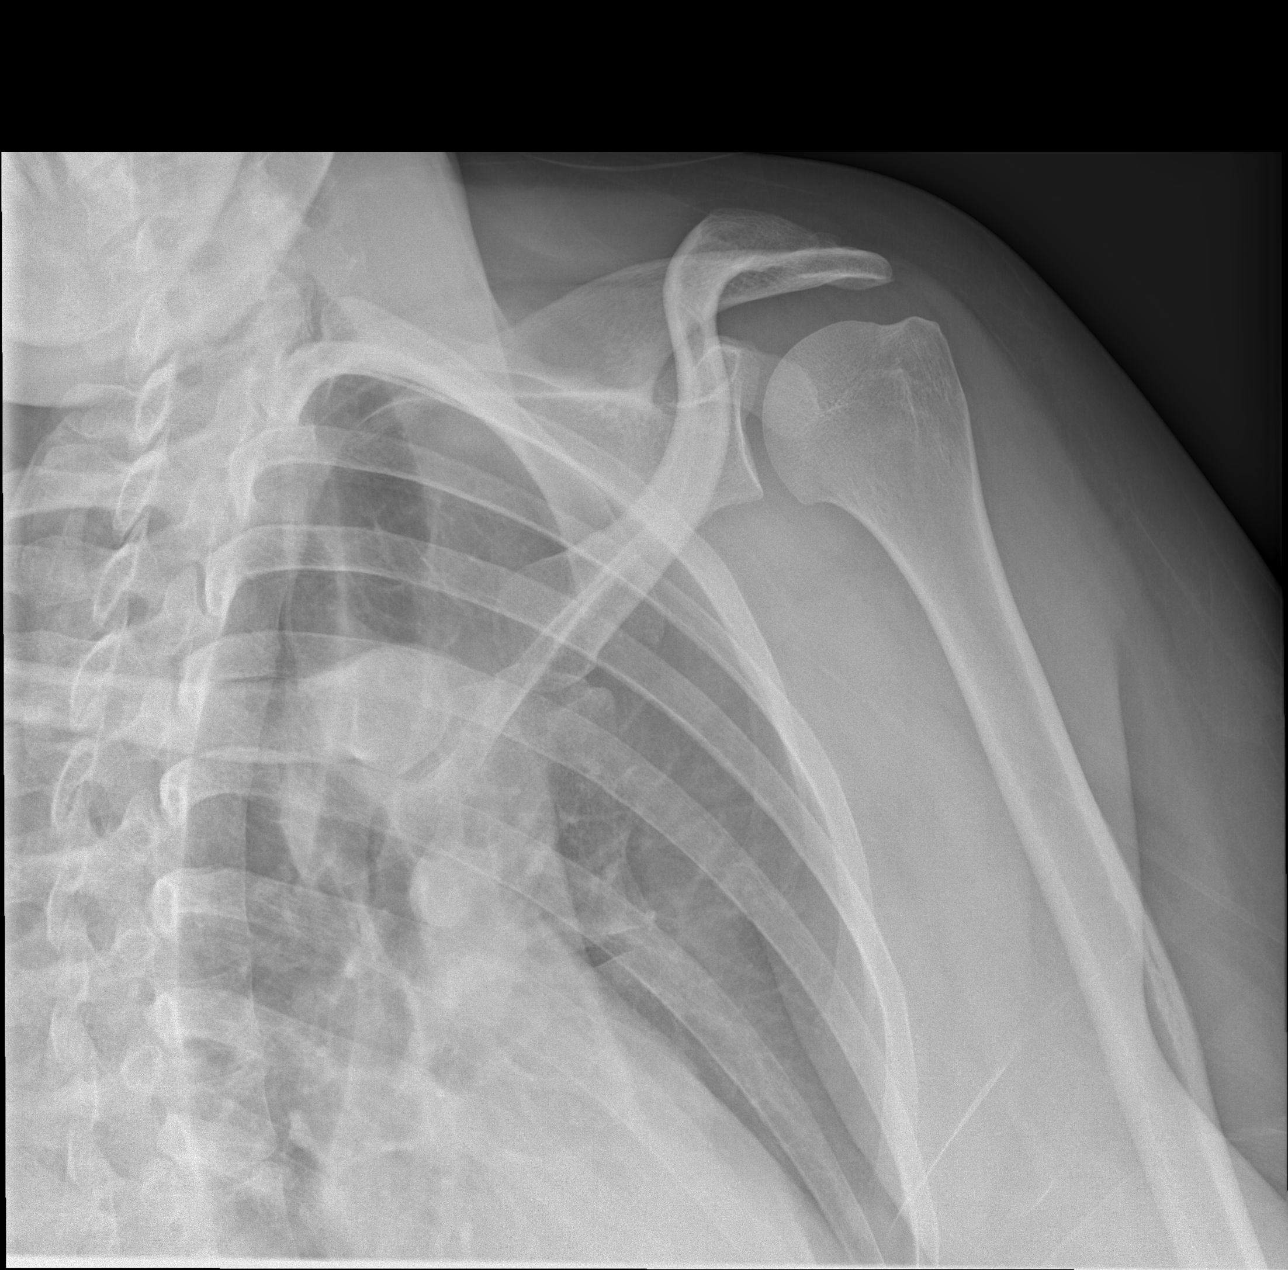

[w shoulder y-view left]
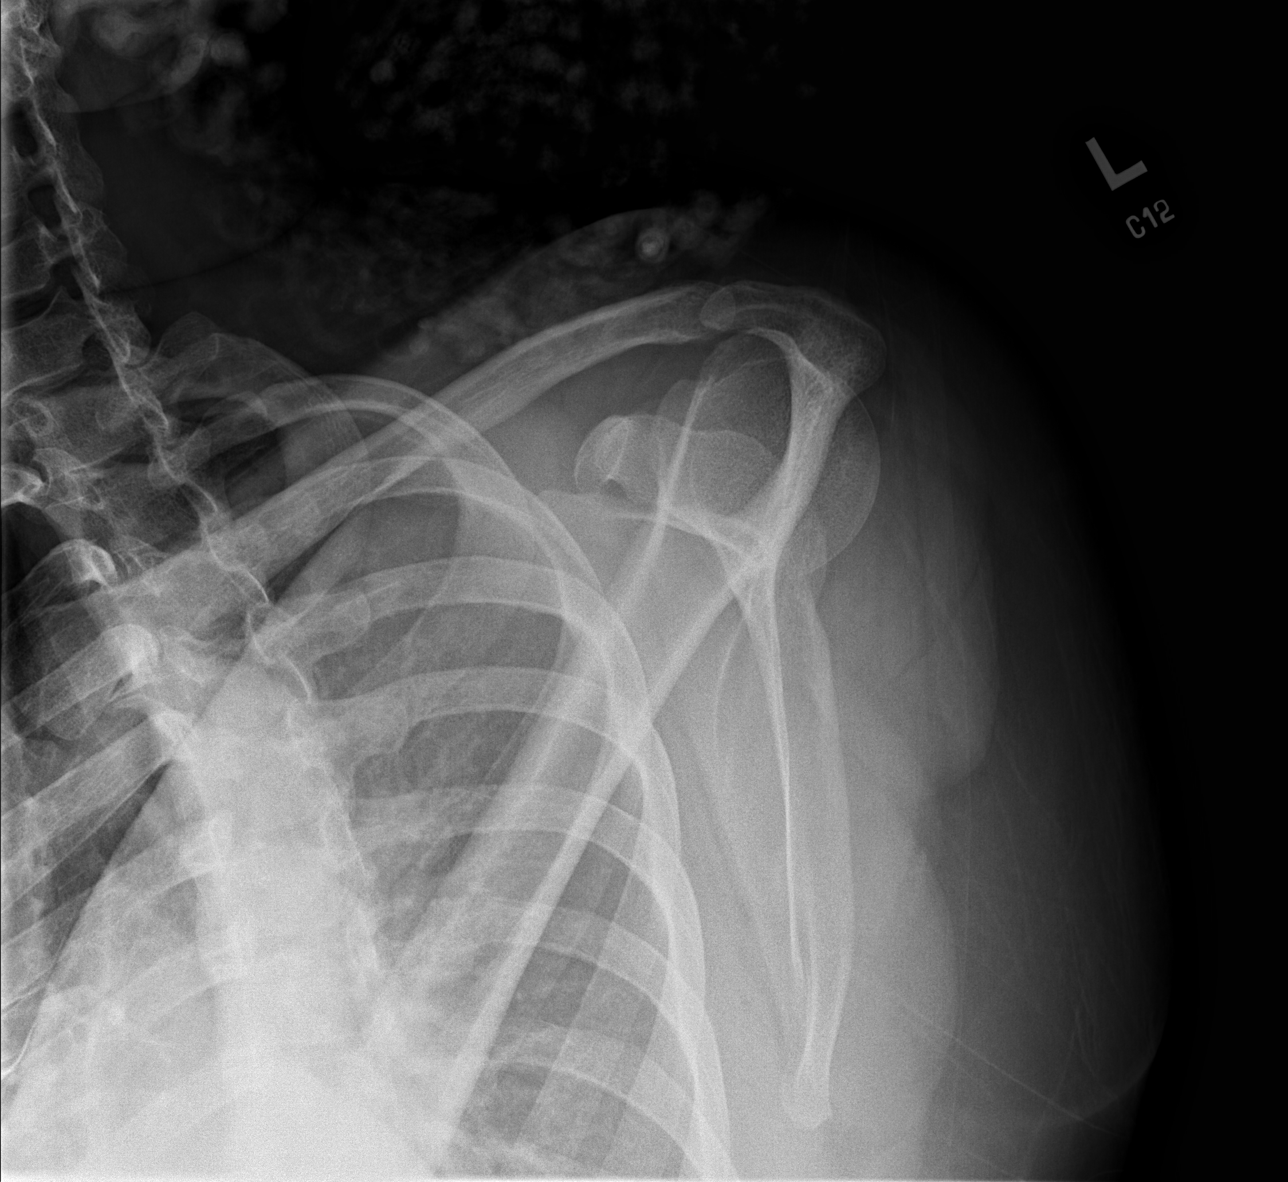

[x shoulder axillary left]
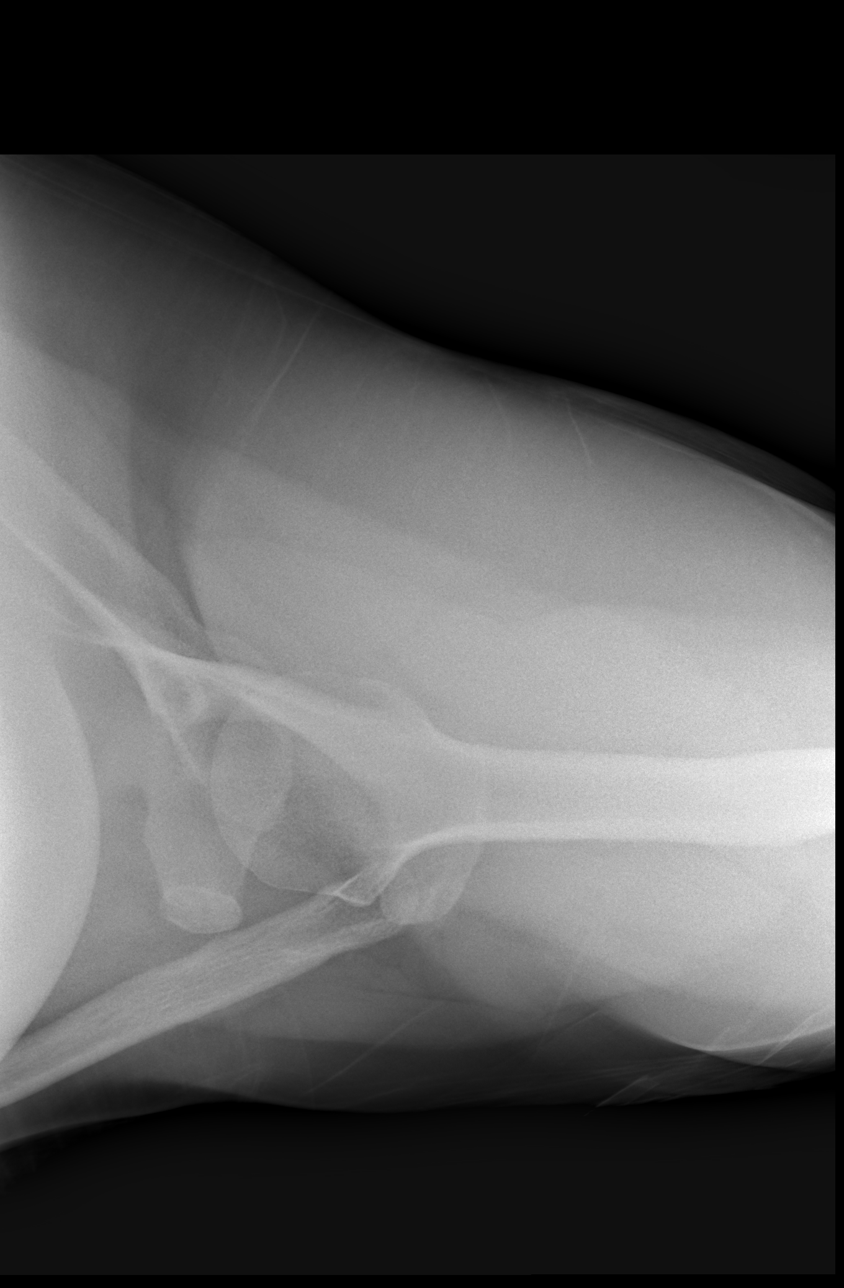

[3 of 3 positions shown; findings below may reference images not displayed]

FINDINGS: There is no evidence of fracture or dislocation. There is no
evidence of arthropathy or other focal bone abnormality. Soft
tissues are unremarkable.
IMPRESSION: Negative.

## 2019-01-09 MED ORDER — IBUPROFEN 800 MG PO TABS
800.0000 mg | ORAL_TABLET | Freq: Once | ORAL | Status: AC
  Administered 2019-01-09: 800 mg via ORAL
  Filled 2019-01-09: qty 1

## 2019-01-09 MED ORDER — CYCLOBENZAPRINE HCL 10 MG PO TABS: 10.0000 mg | ORAL_TABLET | Freq: Two times a day (BID) | ORAL | 0 refills | Status: DC | PRN

## 2019-01-09 MED ORDER — IBUPROFEN 800 MG PO TABS: 800.0000 mg | ORAL_TABLET | Freq: Four times a day (QID) | ORAL | 0 refills | Status: DC | PRN

## 2019-01-09 NOTE — ED Triage Notes (Signed)
Pt states she fell today in the shower, as it was slick. Pt c/o left shoulder and elbow pain.

## 2019-01-09 NOTE — Discharge Instructions (Signed)
You have been evaluated for your fall.  It appears that you have bruised your left elbow.  You are also likely having a left shoulder strain.  Wear sling as needed for comfort.  Take medication prescribed as needed.  Follow-up with orthopedic doctor in 1 week for repeat x-ray of the left elbow if you still have persistent pain.

## 2019-01-09 NOTE — ED Provider Notes (Signed)
[place] DEPT Provider Note   CSN: [id number] Arrival date & time: 01/09/19  [id number]     History   Chief Complaint Chief Complaint  Patient presents with  . Fall    HPI [name] is a 43 y.o. female.     The history is provided by the patient. No language interpreter was used.  Fall     43 year old female presenting for evaluation of a fall.  Patient states this morning she stepped into the shower and slipped, fell and landed on her left shoulder and elbow.  She denies hitting her head or loss of consciousness.  She report acute onset of sharp throbbing pain about the elbow and shoulder, 9 out of 10, persistent, worse with movement.  She denies any significant headache, neck pain, or pain anywhere else.  Denies any wrist pain.  No specific treatment tried.  She is currently not pregnant.  She denies any precipitating symptoms prior to the fall.  No back pain.   Past Medical History:  Diagnosis Date  . Uterine fibroid     Patient Active Problem List   Diagnosis Date Noted  . Viral URI with cough 06/06/2018    Past Surgical History:  Procedure Laterality Date  . ABDOMINAL HYSTERECTOMY     2010  . CESAREAN SECTION     x 2     OB History    Gravida  7   Para  5   Term  4   Preterm  1   AB  2   Living  5     SAB  1   TAB  1   Ectopic      Multiple      Live Births               Home Medications    Prior to Admission medications   Medication Sig Start Date End Date Taking? Authorizing Provider  cephALEXin (KEFLEX) 500 MG capsule Take 1 capsule (500 mg total) by mouth 4 (four) times daily. 08/05/18   Raylene Everts, MD  fluconazole (DIFLUCAN) 150 MG tablet Take 1 tablet (150 mg total) by mouth daily. Repeat in 1 week if needed 08/05/18   Raylene Everts, MD  ibuprofen (ADVIL,MOTRIN) 800 MG tablet Take 1 tablet (800 mg total) by mouth 3 (three) times daily. 08/05/18   Raylene Everts, MD  LORATADINE  PO Take by mouth.    [provider]    Family History Family History  Problem Relation Age of Onset  . Cancer Mother   . HIV Father     Social History Social History   Tobacco Use  . Smoking status: Never Smoker  . Smokeless tobacco: Never Used  Substance Use Topics  . Alcohol use: No  . Drug use: No     Allergies   Patient has no known allergies.   Review of Systems Review of Systems  Musculoskeletal: Positive for arthralgias.  Skin: Negative for wound.  Neurological: Negative for dizziness.  All other systems reviewed and are negative.    Physical Exam Updated Vital Signs BP (!) 145/92 (BP Location: Right Arm)   Pulse 83   Temp 97.9 F (36.6 C) (Oral)   Resp 18   Ht 5\' 7"  (1.702 m)   Wt 95.3 kg   SpO2 99%   BMI 32.89 kg/m   Physical Exam Vitals signs and nursing note reviewed.  Constitutional:      General: She  is not in acute distress.    Appearance: She is well-developed. She is obese.  HENT:     Head: Normocephalic and atraumatic.  Eyes:     Conjunctiva/sclera: Conjunctivae normal.  Neck:     Musculoskeletal: Neck supple.  Musculoskeletal:        General: Tenderness (Left shoulder: Tenderness to the lower deltoid and posterior shoulder on palpation with decreased range of motion but no deformity.  Left elbow: Tenderness to posterior elbow with mild swelling noted and decreased flexion extension secondary to pain ) present.     Comments: Left wrist nontender, normal radial pulse.  Left hand with normal grip strength and nontender.  Skin:    Findings: No rash.  Neurological:     Mental Status: She is alert and oriented to person, place, and time.      ED Treatments / Results  Labs (all labs ordered are listed, but only abnormal results are displayed) Labs Reviewed - No data to display  EKG None  Radiology Dg Elbow Complete Left  Result Date: 01/09/2019 CLINICAL DATA:  Acute LEFT elbow pain following fall. Initial encounter.  EXAM: LEFT ELBOW - COMPLETE 3+ VIEW COMPARISON:  None. FINDINGS: A small elbow effusion is noted. No acute fracture, subluxation or dislocation noted. No focal bony lesions are present. Minimal degenerative change at the LATERAL elbow joint noted. IMPRESSION: Small elbow effusion without acute bony abnormality identified. Consider clinical/radiographic follow-up. Electronically Signed   By: Margarette Canada M.D.   On: 01/09/2019 10:46   Dg Shoulder Left  Result Date: 01/09/2019 CLINICAL DATA:  Acute LEFT shoulder pain following fall. Initial encounter. EXAM: LEFT SHOULDER - 2+ VIEW COMPARISON:  None. FINDINGS: There is no evidence of fracture or dislocation. There is no evidence of arthropathy or other focal bone abnormality. Soft tissues are unremarkable. IMPRESSION: Negative. Electronically Signed   By: Margarette Canada M.D.   On: 01/09/2019 10:46    Procedures Procedures (including critical care time)  Medications Ordered in ED Medications  ibuprofen (ADVIL) tablet 800 mg (800 mg Oral Given 01/09/19 1221)     Initial Impression / Assessment and Plan / ED Course  I have reviewed the triage vital signs and the nursing notes.  Pertinent labs & imaging results that were available during my care of the patient were reviewed by me and considered in my medical decision making (see chart for details).        BP (!) 145/92 (BP Location: Right Arm)   Pulse 83   Temp 97.9 F (36.6 C) (Oral)   Resp 18   Ht 5\' 7"  (1.702 m)   Wt 95.3 kg   SpO2 99%   BMI 32.89 kg/m    Final Clinical Impressions(s) / ED Diagnoses   Final diagnoses:  Contusion of left elbow, initial encounter  Left shoulder strain, initial encounter    ED Discharge Orders         Ordered    ibuprofen (ADVIL) 800 MG tablet  Every 6 hours PRN     01/09/19 1253    cyclobenzaprine (FLEXERIL) 10 MG tablet  2 times daily PRN     01/09/19 1253         11:59 AM Patient with mechanical fall injuring her left shoulder and left  elbow from the fall.  X-ray of the left shoulder is unremarkable, x-ray of left elbow demonstrate a small elbow effusion without any acute bony abnormalities.  Radiologist does recommend clinical/radiographic follow-up therefore patient is made aware and will  have follow-up x-ray in a week.  Will provide sling for support, will provide rice therapy.   Domenic Moras, PA-C 01/11/19 0719    Lucrezia Starch, MD 01/12/19 [phone]

## 2019-01-09 NOTE — ED Notes (Signed)
No response when called to room from triage.

## 2019-01-09 NOTE — ED Notes (Signed)
No respiratory or acute distress noted alert and oriented x 3 call light in reach. 

## 2019-06-17 ENCOUNTER — Ambulatory Visit (HOSPITAL_COMMUNITY)
Admission: EM | Admit: 2019-06-17 | Discharge: 2019-06-17 | Disposition: A | Discharge status: Home / Self Care | Payer: BC Managed Care – PPO | Attending: Internal Medicine | Admitting: Internal Medicine

## 2019-06-17 ENCOUNTER — Encounter (HOSPITAL_COMMUNITY): Payer: Self-pay

## 2019-06-17 DIAGNOSIS — L089 Local infection of the skin and subcutaneous tissue, unspecified: Secondary | ICD-10-CM | POA: Diagnosis not present

## 2019-06-17 DIAGNOSIS — T148XXA Other injury of unspecified body region, initial encounter: Secondary | ICD-10-CM | POA: Diagnosis not present

## 2019-06-17 MED ORDER — HYDROXYZINE HCL 25 MG PO TABS: 25.0000 mg | ORAL_TABLET | Freq: Three times a day (TID) | ORAL | 0 refills | Status: DC | PRN

## 2019-06-17 MED ORDER — CEPHALEXIN 500 MG PO CAPS: 500.0000 mg | ORAL_CAPSULE | Freq: Four times a day (QID) | ORAL | 0 refills | Status: DC

## 2019-06-17 MED ORDER — CETIRIZINE HCL 10 MG PO TABS: 10.0000 mg | ORAL_TABLET | Freq: Every day | ORAL | Status: DC

## 2019-06-17 MED ORDER — MICONAZOLE NITRATE 2 % EX POWD: CUTANEOUS | 0 refills | Status: DC | PRN

## 2019-06-17 MED ORDER — FLUCONAZOLE 150 MG PO TABS: 150.0000 mg | ORAL_TABLET | Freq: Every day | ORAL | 0 refills | Status: DC

## 2019-06-17 NOTE — ED Triage Notes (Signed)
Pt reports having a boil in the left sided groin area x 1 week.

## 2019-06-17 NOTE — ED Provider Notes (Signed)
[place]    CSN: [id number] Arrival date & time: 06/17/19  1937      History   Chief Complaint Chief Complaint  Patient presents with  . Abscess    HPI [name] is a 44 y.o. female with a history of eczema comes to urgent care with right groin wound of 1 week duration.  Patient says that she had a painful swelling in the right groin a week ago.  She attributed the painful swelling to an infected hair follicle.  She used the pain to the remove the swelling and it has since been irritated.  Patient comes in because of increasing pain and induration.  No fever or chills.  No nausea or vomiting.  Patient has eczema and suffers chronic generalized pruritus.  She scratches herself with a comb and that may have contributed to her current condition.  HPI  Past Medical History:  Diagnosis Date  . Uterine fibroid     Patient Active Problem List   Diagnosis Date Noted  . Viral URI with cough 06/06/2018    Past Surgical History:  Procedure Laterality Date  . ABDOMINAL HYSTERECTOMY     2010  . CESAREAN SECTION     x 2    OB History    Gravida  7   Para  5   Term  4   Preterm  1   AB  2   Living  5     SAB  1   TAB  1   Ectopic      Multiple      Live Births               Home Medications    Prior to Admission medications   Medication Sig Start Date End Date Taking? Authorizing Provider  cephALEXin (KEFLEX) 500 MG capsule Take 1 capsule (500 mg total) by mouth 4 (four) times daily. 06/17/19   Chase Picket, MD  cetirizine (ZYRTEC ALLERGY) 10 MG tablet Take 1 tablet (10 mg total) by mouth daily. 06/17/19   Chase Picket, MD  fluconazole (DIFLUCAN) 150 MG tablet Take 1 tablet (150 mg total) by mouth daily. Repeat in 3 days if needed 06/17/19   Buffey Zabinski, Myrene Galas, MD  hydrOXYzine (ATARAX/VISTARIL) 25 MG tablet Take 1 tablet (25 mg total) by mouth every 8 (eight) hours as needed for itching. 06/17/19   Chase Picket, MD  miconazole  (MICOTIN) 2 % powder Apply topically as needed for itching. 06/17/19   Chase Picket, MD  LORATADINE PO Take by mouth.  06/17/19  [provider]    Family History Family History  Problem Relation Age of Onset  . Cancer Mother   . HIV Father     Social History Social History   Tobacco Use  . Smoking status: Never Smoker  . Smokeless tobacco: Never Used  Substance Use Topics  . Alcohol use: No  . Drug use: No     Allergies   Patient has no known allergies.   Review of Systems Review of Systems  Constitutional: Negative for activity change, chills, fatigue and fever.  Respiratory: Negative for cough, shortness of breath and wheezing.   Gastrointestinal: Negative for nausea and vomiting.  Genitourinary: Negative for dysuria.  Musculoskeletal: Negative for arthralgias and joint swelling.  Skin: Positive for color change and wound. Negative for rash.  Neurological: Negative for dizziness, light-headedness and headaches.     Physical Exam Triage Vital Signs ED Triage Vitals  Enc Vitals Group     BP 06/17/19 2002 122/77     Pulse Rate 06/17/19 2002 98     Resp 06/17/19 2002 18     Temp 06/17/19 2002 98.3 F (36.8 C)     Temp Source 06/17/19 2002 Oral     SpO2 06/17/19 2002 99 %     Weight --      Height --      Head Circumference --      Peak Flow --      Pain Score 06/17/19 2000 8     Pain Loc --      Pain Edu? --      Excl. in [place]? --    No data found.  Updated Vital Signs BP 122/77 (BP Location: Right Arm)   Pulse 98   Temp 98.3 F (36.8 C) (Oral)   Resp 18   SpO2 99%   Visual Acuity Right Eye Distance:   Left Eye Distance:   Bilateral Distance:    Right Eye Near:   Left Eye Near:    Bilateral Near:     Physical Exam Vitals and nursing note reviewed.  Cardiovascular:     Rate and Rhythm: Normal rate and regular rhythm.     Pulses: Normal pulses.  Pulmonary:     Effort: Pulmonary effort is normal.     Breath sounds: Wheezing and  rhonchi present.  Abdominal:     General: Bowel sounds are normal. There is no distension.     Palpations: Abdomen is soft. There is no mass.     Tenderness: There is no abdominal tenderness.  Musculoskeletal:        General: No swelling or tenderness. Normal range of motion.  Skin:    Comments: Superficial ulceration in the right groin.  Minimal surrounding erythema.  Induration measures about 3 cm in the longest diameter.  Neurological:     General: No focal deficit present.     Mental Status: She is oriented to person, place, and time.      UC Treatments / Results  Labs (all labs ordered are listed, but only abnormal results are displayed) Labs Reviewed - No data to display  EKG   Radiology No results found.  Procedures Procedures (including critical care time)  Medications Ordered in UC Medications - No data to display  Initial Impression / Assessment and Plan / UC Course  I have reviewed the triage vital signs and the nursing notes.  Pertinent labs & imaging results that were available during my care of the patient were reviewed by me and considered in my medical decision making (see chart for details).    1.  Infected right groin ulceration: Keflex 500 mg 4 times daily for 5 days Miconazole powder to be applied to the right groin Hydroxyzine as needed for itching Zyrtec 10 mg orally daily Fluconazole If patient symptoms worsens i.e. increased pain, purulent discharge, fever, chills she is welcome to return to the urgent care to be reevaluated. Final Clinical Impressions(s) / UC Diagnoses   Final diagnoses:  Infected wound   Discharge Instructions   None    ED Prescriptions    Medication Sig Dispense Auth. Provider   fluconazole (DIFLUCAN) 150 MG tablet Take 1 tablet (150 mg total) by mouth daily. Repeat in 3 days if needed 2 tablet Kalen Ratajczak, Myrene Galas, MD   hydrOXYzine (ATARAX/VISTARIL) 25 MG tablet Take 1 tablet (25 mg total) by mouth every 8 (eight)  hours as needed for  itching. 30 tablet Adyan Palau, Myrene Galas, MD   cetirizine (ZYRTEC ALLERGY) 10 MG tablet Take 1 tablet (10 mg total) by mouth daily.  Chase Picket, MD   cephALEXin (KEFLEX) 500 MG capsule Take 1 capsule (500 mg total) by mouth 4 (four) times daily. 20 capsule Zachariah Pavek, Myrene Galas, MD   miconazole (MICOTIN) 2 % powder Apply topically as needed for itching. 70 g Neymar Dowe, Myrene Galas, MD     PDMP not reviewed this encounter.   Chase Picket, MD 06/18/19 1626

## 2020-04-11 ENCOUNTER — Inpatient Hospital Stay (HOSPITAL_COMMUNITY)
Admission: EM | Admit: 2020-04-11 | Discharge: 2020-04-15 | DRG: 060 | Disposition: A | Discharge status: Home / Self Care | Payer: BC Managed Care – PPO | Attending: Internal Medicine | Admitting: Internal Medicine

## 2020-04-11 ENCOUNTER — Encounter (HOSPITAL_COMMUNITY): Payer: Self-pay | Admitting: *Deleted

## 2020-04-11 ENCOUNTER — Other Ambulatory Visit: Payer: Self-pay

## 2020-04-11 ENCOUNTER — Ambulatory Visit (HOSPITAL_COMMUNITY)
Admit: 2020-04-11 | Discharge: 2020-04-11 | Disposition: A | Discharge status: Home / Self Care | Payer: BC Managed Care – PPO | Attending: Emergency Medicine | Admitting: Emergency Medicine

## 2020-04-11 DIAGNOSIS — G35 Multiple sclerosis: Secondary | ICD-10-CM | POA: Diagnosis present

## 2020-04-11 DIAGNOSIS — E559 Vitamin D deficiency, unspecified: Secondary | ICD-10-CM | POA: Diagnosis not present

## 2020-04-11 DIAGNOSIS — R202 Paresthesia of skin: Secondary | ICD-10-CM

## 2020-04-11 DIAGNOSIS — R200 Anesthesia of skin: Secondary | ICD-10-CM | POA: Diagnosis not present

## 2020-04-11 DIAGNOSIS — Z832 Family history of diseases of the blood and blood-forming organs and certain disorders involving the immune mechanism: Secondary | ICD-10-CM | POA: Diagnosis not present

## 2020-04-11 DIAGNOSIS — G35D Multiple sclerosis, unspecified: Secondary | ICD-10-CM | POA: Diagnosis present

## 2020-04-11 DIAGNOSIS — Z809 Family history of malignant neoplasm, unspecified: Secondary | ICD-10-CM

## 2020-04-11 DIAGNOSIS — Z20822 Contact with and (suspected) exposure to covid-19: Secondary | ICD-10-CM | POA: Diagnosis not present

## 2020-04-11 LAB — LIPID PANEL
Cholesterol: 118 mg/dL (ref 0–200)
HDL: 37 mg/dL — ABNORMAL LOW (ref >40)
LDL Cholesterol: 72 mg/dL (ref 0–99)
Total CHOL/HDL Ratio: 3.2 RATIO
Triglycerides: 43 mg/dL (ref <150)
VLDL: 9 mg/dL (ref 0–40)

## 2020-04-11 LAB — CBC WITH DIFFERENTIAL/PLATELET
Abs Immature Granulocytes: 0 10*3/uL (ref 0.00–0.07)
Basophils Absolute: 0 10*3/uL (ref 0.0–0.1)
Basophils Relative: 0 %
Eosinophils Absolute: 0.2 10*3/uL (ref 0.0–0.5)
Eosinophils Relative: 5 %
HCT: 40.1 % (ref 36.0–46.0)
Hemoglobin: 11.5 g/dL — ABNORMAL LOW (ref 12.0–15.0)
Immature Granulocytes: 0 %
Lymphocytes Relative: 45 %
Lymphs Abs: 1.4 10*3/uL (ref 0.7–4.0)
MCH: 21.2 pg — ABNORMAL LOW (ref 26.0–34.0)
MCHC: 28.7 g/dL — ABNORMAL LOW (ref 30.0–36.0)
MCV: 74 fL — ABNORMAL LOW (ref 80.0–100.0)
Monocytes Absolute: 0.2 10*3/uL (ref 0.1–1.0)
Monocytes Relative: 5 %
Neutro Abs: 1.4 10*3/uL — ABNORMAL LOW (ref 1.7–7.7)
Neutrophils Relative %: 45 %
Platelets: 311 10*3/uL (ref 150–400)
RBC: 5.42 MIL/uL — ABNORMAL HIGH (ref 3.87–5.11)
RDW: 15.6 % — ABNORMAL HIGH (ref 11.5–15.5)
WBC: 3.1 10*3/uL — ABNORMAL LOW (ref 4.0–10.5)
nRBC: 0 % (ref 0.0–0.2)

## 2020-04-11 LAB — BASIC METABOLIC PANEL
Anion gap: 7 (ref 5–15)
BUN: 10 mg/dL (ref 6–20)
CO2: 26 mmol/L (ref 22–32)
Calcium: 8.3 mg/dL — ABNORMAL LOW (ref 8.9–10.3)
Chloride: 106 mmol/L (ref 98–111)
Creatinine, Ser: 0.64 mg/dL (ref 0.44–1.00)
GFR, Estimated: >60 mL/min (ref >60)
Glucose, Bld: 101 mg/dL — ABNORMAL HIGH (ref 70–99)
Potassium: 3.8 mmol/L (ref 3.5–5.1)
Sodium: 139 mmol/L (ref 135–145)

## 2020-04-11 LAB — HEMOGLOBIN A1C
Hgb A1c MFr Bld: 5.9 % — ABNORMAL HIGH (ref 4.8–5.6)
Mean Plasma Glucose: 122.63 mg/dL

## 2020-04-11 LAB — MAGNESIUM: Magnesium: 2.1 mg/dL (ref 1.7–2.4)

## 2020-04-11 LAB — RESP PANEL BY RT-PCR (FLU A&B, COVID) ARPGX2
Influenza A by PCR: NEGATIVE
Influenza B by PCR: NEGATIVE
SARS Coronavirus 2 by RT PCR: NEGATIVE

## 2020-04-11 IMAGING — MR MR HEAD WO/W CM
13 of 17 series · 24 of 48 positions shown · IV contrast (gadavist)
Comparison: No pertinent prior exams available for comparison.

CLINICAL DATA: Multiple sclerosis, new event. Additional provided:
Patient reports 2-3 days of brief periods of numbness in right arm
and leg.

EXAM:
MRI HEAD WITHOUT AND WITH CONTRAST
TECHNIQUE: Multiplanar, multiecho pulse sequences of the brain and surrounding
structures were obtained without and with intravenous contrast.
CONTRAST:  9mL GADAVIST GADOBUTROL 1 MMOL/ML IV SOLN

[Series 3: DWI · axial · 3.0mm · 0.94mm/px · z∈[-119,+16]mm · 2 of 100 slices shown (1 of 2)]
[im 1/100]
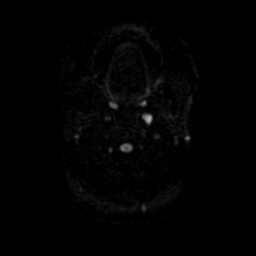
[im 100/100]
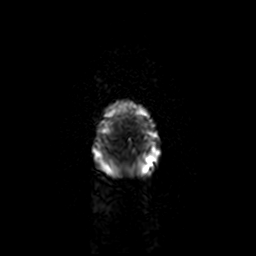

[Series 4: DWI · coronal · 4.0mm · 0.94mm/px · 2 of 72 slices shown (2 of 2)]
[im 1/72]
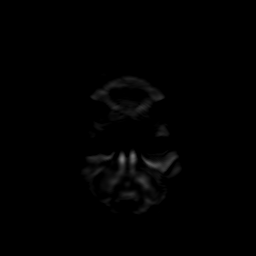
[im 72/72]
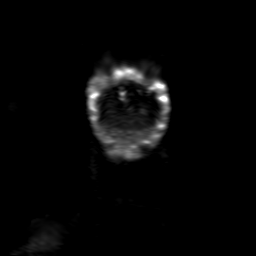

[Series 5: FLAIR · axial · 3.0mm · 0.45mm/px · 1 of 25 slices shown (1 of 3)]
[im 1/25]
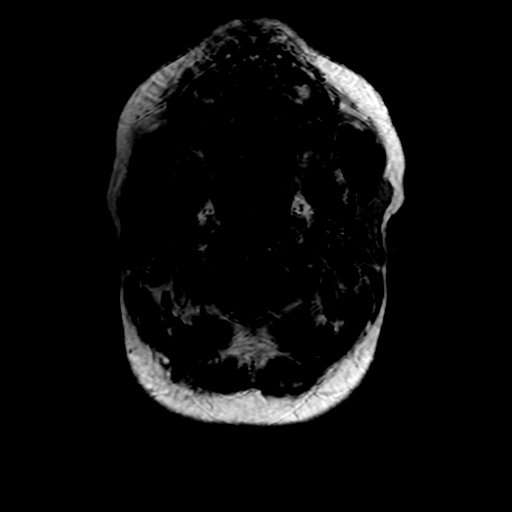

[Series 6: (person_name) · axial · 3.0mm · 0.47mm/px · z∈[-125,-59]mm · 2 of 100 slices shown]
[im 1/100]
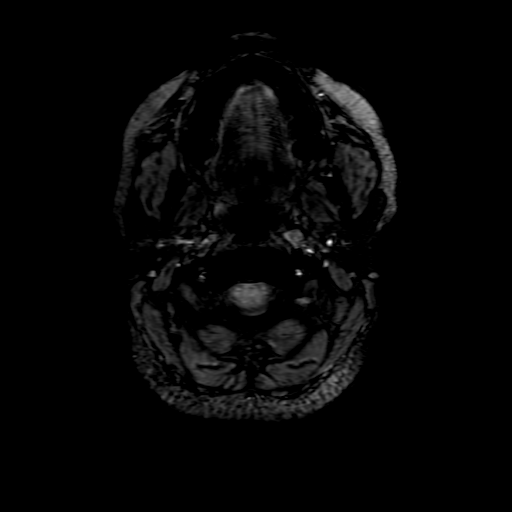
[im 50/100]
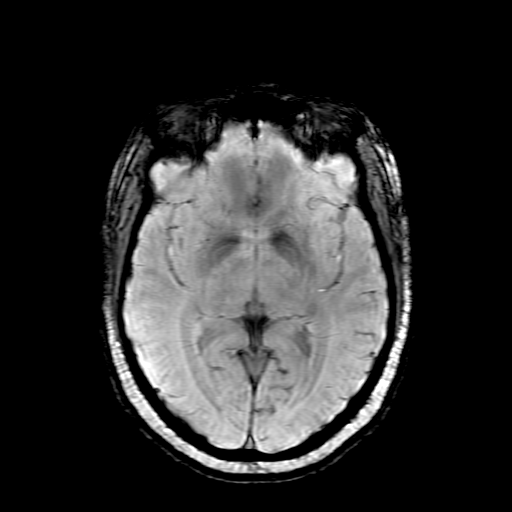

[Series 7: FLAIR · sagittal · 5.0mm · 0.47mm/px · 1 of 25 slices shown (2 of 3)]
[im 1/25]
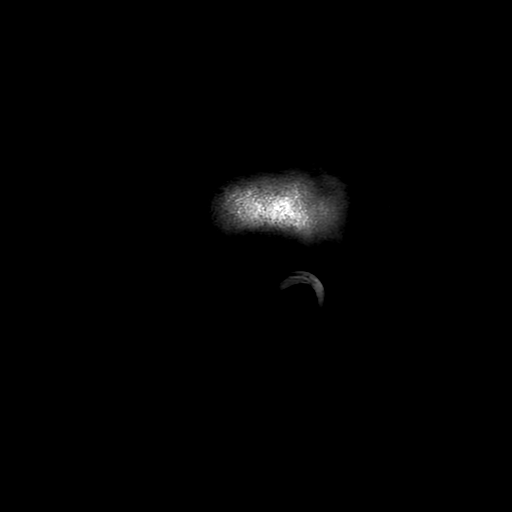

[Series 8: T2 · axial · 5.0mm · 0.47mm/px · 1 of 25 slices shown]
[im 1/25]
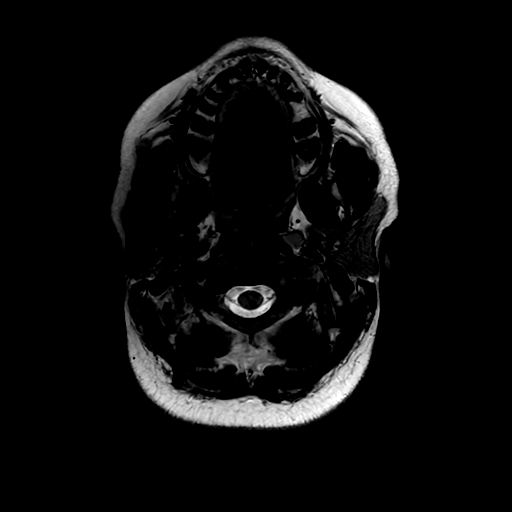

[Series 10: FLAIR · sagittal · 1.6mm · 0.49mm/px · 7 of 200 slices shown (3 of 3)]
[im 1/200]
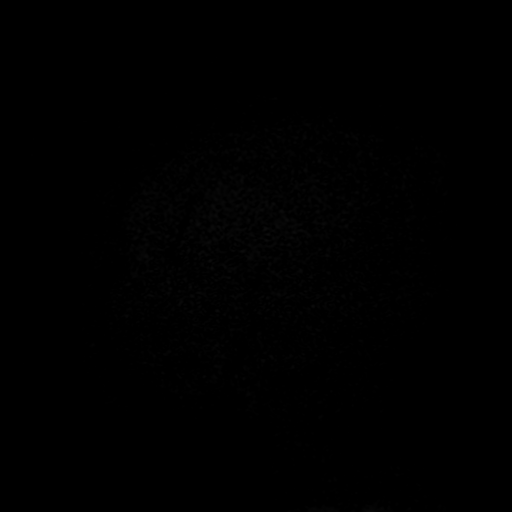
[im 34/200]
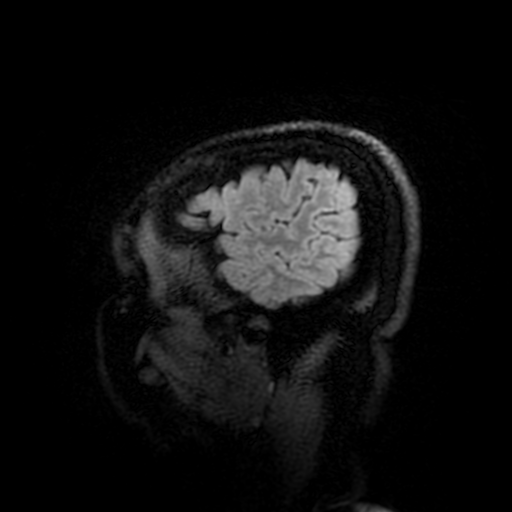
[im 67/200]
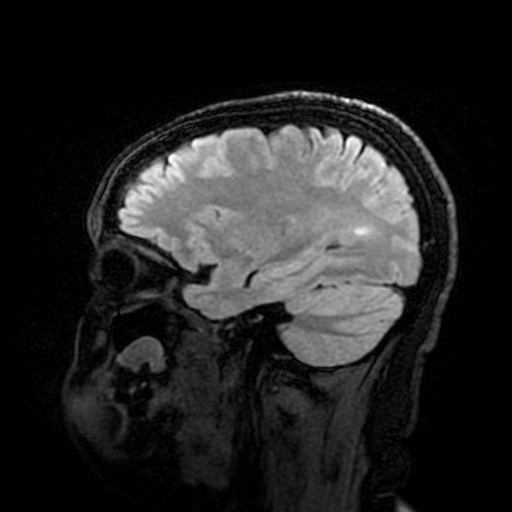
[im 100/200]
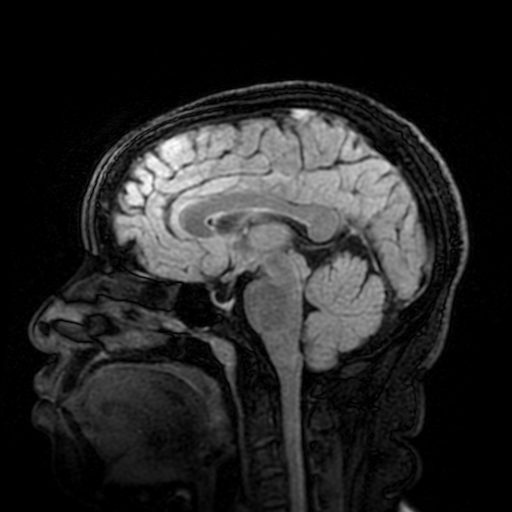
[im 133/200]
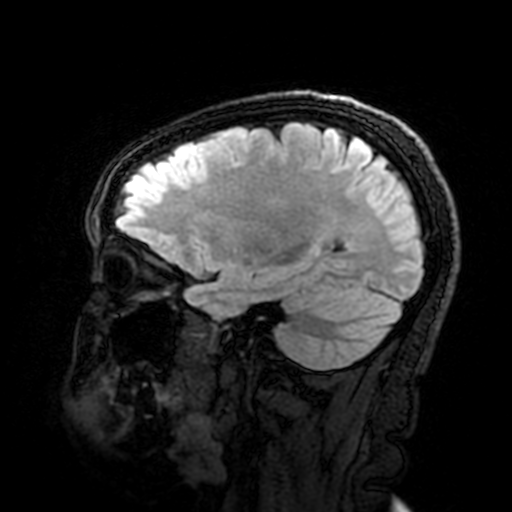
[im 166/200]
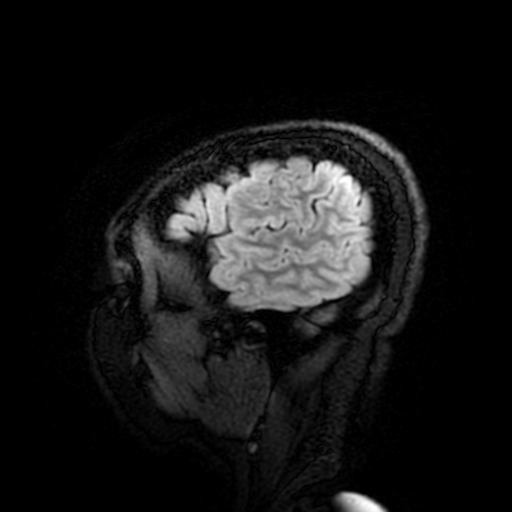
[im 200/200]
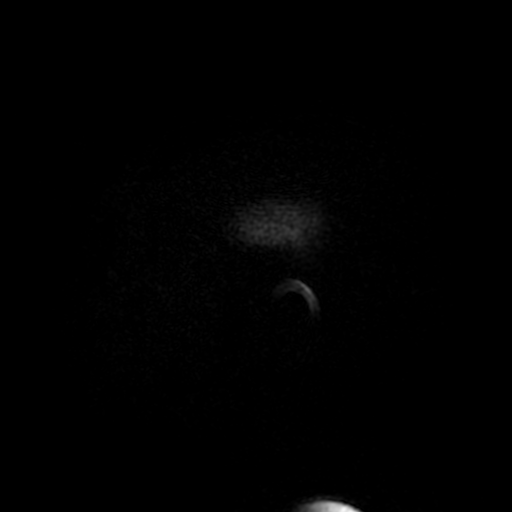

[Series 11: T2 post-contrast · coronal · 5.0mm · 0.39mm/px · 1 of 30 slices shown]
[im 1/30]
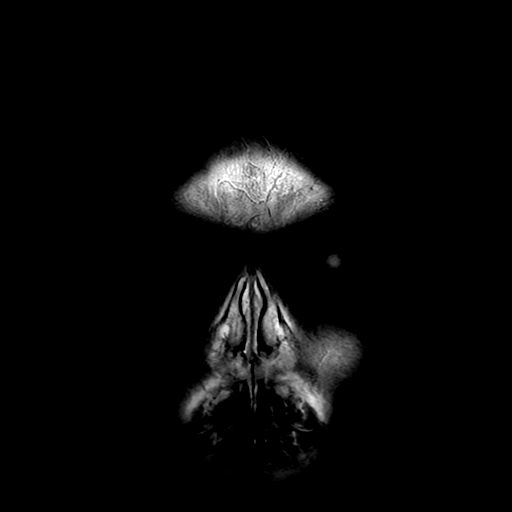

[Series 12: T1 · axial · 3.0mm · 0.94mm/px · z∈[-125,+7]mm · 2 of 50 slices shown]
[im 1/50]
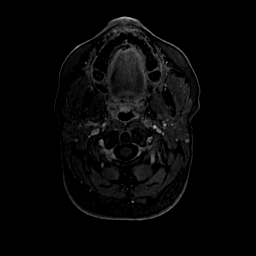
[im 50/50]
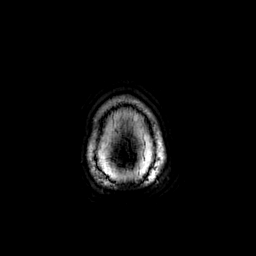

[Series 13: T1 post-contrast · coronal · 5.0mm · 0.43mm/px · 1 of 30 slices shown]
[im 1/30]
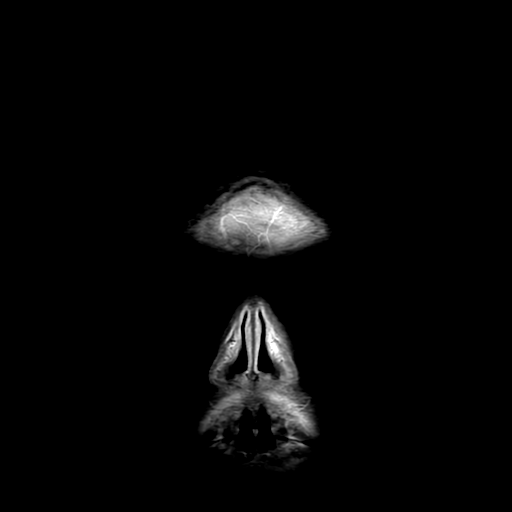

[Series 14: FLAIR post-contrast · sagittal · 5.0mm · 0.47mm/px · 1 of 25 slices shown]
[im 1/25]
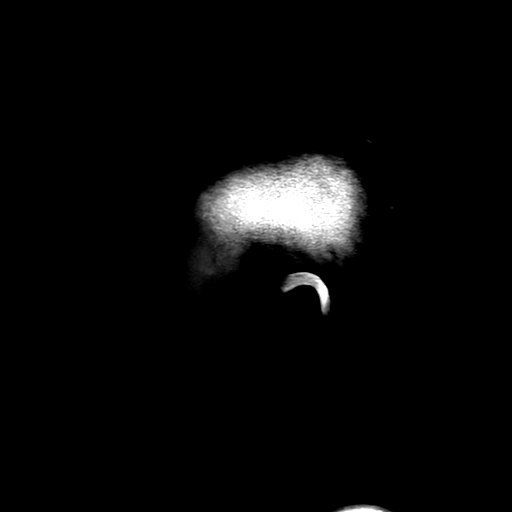

[Series 350: ADC · axial · 3.0mm · 0.94mm/px · z∈[-119,+16]mm · 2 of 50 slices shown (1 of 2)]
[im 1/50]
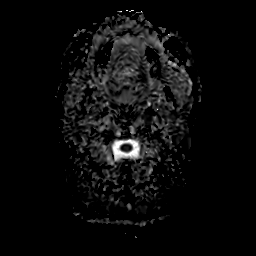
[im 50/50]
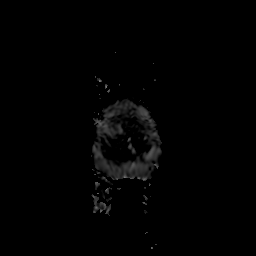

[Series 450: ADC · coronal · 4.0mm · 0.94mm/px · 1 of 36 slices shown (2 of 2)]
[im 1/36]
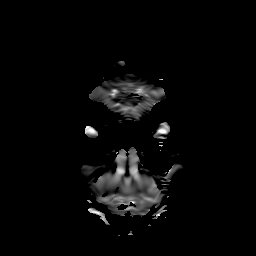

[24 of 48 positions shown; findings below may reference images not displayed]

FINDINGS: Brain:

Cerebral volume is normal for age.

Ill-defined and scattered T2/FLAIR hyperintensity within the
cerebral white matter, pons and right midbrain, nonspecific but
possibly reflecting sequela of demyelinating disease given the
provided history of multiple sclerosis. A 5 mm lesion within the
right parietal white matter demonstrates subtle diffusion weighted
hyperintensity and probable mild enhancement (series 3, image 28)
(series 13, image 8).

No enhancement identified elsewhere within the brain.

8 mm dural-based homogeneously enhancing mass overlying the
posterior right frontal lobe likely reflecting a small incidental
meningioma (series 13, image 13) (series 3, image 47). This mass
abuts the superior sagittal sinus.

No chronic intracranial blood products.

No extra-axial fluid collection.

No midline shift.

Partially empty sella turcica.

Vascular: Expected proximal arterial flow voids.

Skull and upper cervical spine: No focal marrow lesion.

Sinuses/Orbits: Visualized orbits show no acute finding. Trace
ethmoid sinus mucosal thickening. Moderate-sized right maxillary
sinus mucous retention cyst.

These results will be called to the ordering clinician or
representative by the Radiologist Assistant, and communication
documented in the PACS or [REDACTED].
IMPRESSION: Ill-defined and scattered T2/FLAIR hyperintensity within the
cerebral white matter, pons and right midbrain is nonspecific, but
may reflect sequela of demyelinating disease given the provided
history multiple sclerosis. A 5 mm lesion within the right parietal
white matter demonstrates subtle diffusion weighted hyperintensity
and probable mild enhancement. This may reflect a focus of active
demyelination. A subacute infarct also a differential consideration.

Incidentally noted 8 mm dural-based mass overlying the posterior
right frontal lobe, likely a meningioma.

Moderate-sized right maxillary sinus mucous retention cyst

## 2020-04-11 MED ORDER — ONDANSETRON HCL 4 MG/2ML IJ SOLN: 4.0000 mg | Freq: Four times a day (QID) | INTRAMUSCULAR | Status: DC | PRN

## 2020-04-11 MED ORDER — ONDANSETRON HCL 4 MG PO TABS
4.0000 mg | ORAL_TABLET | Freq: Four times a day (QID) | ORAL | Status: DC | PRN
  Administered 2020-04-14: 4 mg via ORAL
  Filled 2020-04-11: qty 1

## 2020-04-11 MED ORDER — ACETAMINOPHEN 325 MG PO TABS: 650.0000 mg | ORAL_TABLET | Freq: Four times a day (QID) | ORAL | Status: DC | PRN

## 2020-04-11 MED ORDER — GADOBUTROL 1 MMOL/ML IV SOLN
9.0000 mL | Freq: Once | INTRAVENOUS | Status: AC | PRN
  Administered 2020-04-11: 9 mL via INTRAVENOUS

## 2020-04-11 MED ORDER — ENOXAPARIN SODIUM 40 MG/0.4ML ~~LOC~~ SOLN
40.0000 mg | SUBCUTANEOUS | Status: DC
  Administered 2020-04-12 – 2020-04-15 (×4): 40 mg via SUBCUTANEOUS
  Filled 2020-04-11 (×4): qty 0.4

## 2020-04-11 MED ORDER — ACETAMINOPHEN 650 MG RE SUPP: 650.0000 mg | Freq: Four times a day (QID) | RECTAL | Status: DC | PRN

## 2020-04-11 NOTE — ED Provider Notes (Signed)
[facility] DEPT Provider Note   CSN: [id number] Arrival date & time: 04/11/20  0930     History Chief Complaint  Patient presents with  . extremity numbness    KARITA DRALLE is a 44 y.o. female with no medical history.  Patient with a chief complaint of right arm and right leg numbness and tingling.  Patient reports that she has been having this symptom for the past 3 days, the numbness and tingling are intermittent, last for 1 to 2 minutes, have no identifiable trigger, nothing alleviates or aggravates the symptoms.  Patient states that she does maintain motor control during these episodes, she is able to stand and walk without difficulty.  She also endorses recent headaches.  These headaches are not always present when she has numbness and tingling.  Patient reports the pain of her headaches is located in the occipital region, headaches are intermittent, 8/10 on pain scale and resolved with NSAIDs.  Patient denies any fever, chills, recent illness, visual disturbance, lightheadedness, dizziness, syncopal episodes, seizure, or tremors.  She denies any previous episodes similar to this.  Patient reports that her daughter was recently diagnosed with MS and also has a first cousin diagnosed with MS.    HPI     Past Medical History:  Diagnosis Date  . Uterine fibroid     Patient Active Problem List   Diagnosis Date Noted  . Multiple sclerosis ([place]) 04/11/2020  . Viral URI with cough 06/06/2018    Past Surgical History:  Procedure Laterality Date  . ABDOMINAL HYSTERECTOMY     2010  . CESAREAN SECTION     x 2     OB History    Gravida  7   Para  5   Term  4   Preterm  1   AB  2   Living  5     SAB  1   TAB  1   Ectopic      Multiple      Live Births              Family History  Problem Relation Age of Onset  . Cancer Mother   . HIV Father   . Multiple sclerosis Daughter   . Multiple sclerosis Cousin     Social  History   Tobacco Use  . Smoking status: Never Smoker  . Smokeless tobacco: Never Used  Vaping Use  . Vaping Use: Never used  Substance Use Topics  . Alcohol use: No  . Drug use: No    Home Medications Prior to Admission medications   Medication Sig Start Date End Date Taking? Authorizing Provider  cephALEXin (KEFLEX) 500 MG capsule Take 1 capsule (500 mg total) by mouth 4 (four) times daily. 06/17/19   Chase Picket, MD  cetirizine (ZYRTEC ALLERGY) 10 MG tablet Take 1 tablet (10 mg total) by mouth daily. 06/17/19   Chase Picket, MD  fluconazole (DIFLUCAN) 150 MG tablet Take 1 tablet (150 mg total) by mouth daily. Repeat in 3 days if needed 06/17/19   Lamptey, Myrene Galas, MD  hydrOXYzine (ATARAX/VISTARIL) 25 MG tablet Take 1 tablet (25 mg total) by mouth every 8 (eight) hours as needed for itching. 06/17/19   Chase Picket, MD  miconazole (MICOTIN) 2 % powder Apply topically as needed for itching. 06/17/19   Chase Picket, MD  LORATADINE PO Take by mouth.  06/17/19  [provider]    Allergies  Patient has no known allergies.  Review of Systems   Review of Systems  Constitutional: Negative for chills and fever.  Eyes: Negative for visual disturbance.  Respiratory: Negative for shortness of breath.   Cardiovascular: Negative for chest pain.  Gastrointestinal: Negative for nausea and vomiting.  Genitourinary: Negative for difficulty urinating and dysuria.  Musculoskeletal: Negative for back pain and neck pain.  Skin: Negative for color change and rash.  Neurological: Positive for numbness. Negative for dizziness, tremors, seizures, syncope, facial asymmetry, speech difficulty, weakness, light-headedness and headaches.  Psychiatric/Behavioral: Negative for confusion.    Physical Exam Updated Vital Signs BP (!) 151/104 (BP Location: Left Arm)   Pulse 82   Temp 98.1 F (36.7 C) (Oral)   Resp 16   Ht 5\' 8"  (1.727 m)   Wt 90.7 kg   SpO2 100%   BMI 30.41 kg/m    Physical Exam Constitutional:      General: She is not in acute distress.    Appearance: She is obese. She is not ill-appearing, toxic-appearing or diaphoretic.  HENT:     Head: Normocephalic.  Eyes:     General: Vision grossly intact.     Extraocular Movements: Extraocular movements intact.     Pupils: Pupils are equal, round, and reactive to light.  Cardiovascular:     Rate and Rhythm: Normal rate.  Pulmonary:     Effort: Pulmonary effort is normal.  Skin:    General: Skin is warm and dry.  Neurological:     General: No focal deficit present.     Mental Status: She is alert.     GCS: GCS eye subscore is 4. GCS verbal subscore is 5. GCS motor subscore is 6.     Cranial Nerves: No cranial nerve deficit or facial asymmetry.     Motor: No weakness, tremor, seizure activity or pronator drift.     Coordination: Romberg sign negative. Rapid alternating movements normal.  Psychiatric:        Behavior: Behavior is cooperative.     ED Results / Procedures / Treatments   Labs (all labs ordered are listed, but only abnormal results are displayed) Labs Reviewed  BASIC METABOLIC PANEL - Abnormal; Notable for the following components:      Result Value   Glucose, Bld 101 (*)    Calcium 8.3 (*)    All other components within normal limits  CBC WITH DIFFERENTIAL/PLATELET - Abnormal; Notable for the following components:   WBC 3.1 (*)    RBC 5.42 (*)    Hemoglobin 11.5 (*)    MCV 74.0 (*)    MCH 21.2 (*)    MCHC 28.7 (*)    RDW 15.6 (*)    Neutro Abs 1.4 (*)    All other components within normal limits  LIPID PANEL - Abnormal; Notable for the following components:   HDL 37 (*)    All other components within normal limits  HEMOGLOBIN A1C - Abnormal; Notable for the following components:   Hgb A1c MFr Bld 5.9 (*)    All other components within normal limits  RESP PANEL BY RT-PCR (FLU A&B, COVID) ARPGX2  MAGNESIUM  METHYLMALONIC ACID, SERUM  VITAMIN B12  COPPER, SERUM  ZINC   HIV ANTIBODY (ROUTINE TESTING W REFLEX)    EKG None  Radiology MR Brain W and Wo Contrast  Result Date: 04/11/2020 CLINICAL DATA:  Multiple sclerosis, new event. Additional provided: Patient reports 2-3 days of brief periods of numbness in right arm and leg.  EXAM: MRI HEAD WITHOUT AND WITH CONTRAST TECHNIQUE: Multiplanar, multiecho pulse sequences of the brain and surrounding structures were obtained without and with intravenous contrast. CONTRAST:  68mL GADAVIST GADOBUTROL 1 MMOL/ML IV SOLN COMPARISON:  No pertinent prior exams available for comparison. FINDINGS: Brain: Cerebral volume is normal for age. Ill-defined and scattered T2/FLAIR hyperintensity within the cerebral white matter, pons and right midbrain, nonspecific but possibly reflecting sequela of demyelinating disease given the provided history of multiple sclerosis. A 5 mm lesion within the right parietal white matter demonstrates subtle diffusion weighted hyperintensity and probable mild enhancement (series 3, image 28) (series 13, image 8). No enhancement identified elsewhere within the brain. 8 mm dural-based homogeneously enhancing mass overlying the posterior right frontal lobe likely reflecting a small incidental meningioma (series 13, image 13) (series 3, image 47). This mass abuts the superior sagittal sinus. No chronic intracranial blood products. No extra-axial fluid collection. No midline shift. Partially empty sella turcica. Vascular: Expected proximal arterial flow voids. Skull and upper cervical spine: No focal marrow lesion. Sinuses/Orbits: Visualized orbits show no acute finding. Trace ethmoid sinus mucosal thickening. Moderate-sized right maxillary sinus mucous retention cyst. These results will be called to the ordering clinician or representative by the Radiologist Assistant, and communication documented in the PACS or [employer]. IMPRESSION: Ill-defined and scattered T2/FLAIR hyperintensity within the cerebral white  matter, pons and right midbrain is nonspecific, but may reflect sequela of demyelinating disease given the provided history multiple sclerosis. A 5 mm lesion within the right parietal white matter demonstrates subtle diffusion weighted hyperintensity and probable mild enhancement. This may reflect a focus of active demyelination. A subacute infarct also a differential consideration. Incidentally noted 8 mm dural-based mass overlying the posterior right frontal lobe, likely a meningioma. Moderate-sized right maxillary sinus mucous retention cyst Electronically Signed   By: Kellie Simmering DO   On: 04/11/2020 18:34    Procedures Procedures (including critical care time)  Medications Ordered in ED Medications  enoxaparin (LOVENOX) injection 40 mg (has no administration in time range)  acetaminophen (TYLENOL) tablet 650 mg (has no administration in time range)    Or  acetaminophen (TYLENOL) suppository 650 mg (has no administration in time range)  ondansetron (ZOFRAN) tablet 4 mg (has no administration in time range)    Or  ondansetron (ZOFRAN) injection 4 mg (has no administration in time range)    ED Course  I have reviewed the triage vital signs and the nursing notes.  Pertinent labs & imaging results that were available during my care of the patient were reviewed by me and considered in my medical decision making (see chart for details).    MDM Rules/Calculators/A&P                          Alert 44 year old female in no acute distress with no pertinent past medical history.  Patient presents with a chief complaint of intermittent right arm and leg numbness and tingling.  Patient states that the symptoms started 3 days prior, th numbness and tingling always occur in both limbs simultaneously, episodes last 1 to 2 minutes, patient denies anything makes episodes better or worse, no identifiable triggers, patient mantains muscle control during episodes.  Patient also reports recent headaches,,  however they are not associated with her numbness episodes.  Patient's headaches are located primarily in the occipital region, intermittent, resolved with NSAIDs.  Patient denies any recent falls or injuries.  Patient denies any recent illness, visual disturbance,  lightheadedness, dizziness, syncopal episodes, seizure, or tremors.  Patient reports that her daughter was recently diagnosed with MS.  Patient had no focal deficit, or weakness on physical exam.  However with positive family history and concern of numbness and tingling there was suspicion for MS. MRI with and without contrast was ordered.  Patient was transported to [facility] for this exam as MRI which was long was not available.  BMP shows slightly decreased calcium at 8.3.  Magnesium was within normal limits CBC showed leukopenia 3.1, RBC slightly increased, hemoglobin slightly decreased at 11.5   MRI showed 1) Ill-defined and scattered T2/FLAIR hyperintensity within the cerebral white matter, pons and right midbrain is nonspecific, but may reflect sequela of demyelinating disease given the provided history multiple sclerosis. A 5 mm lesion within the right parietal white matter demonstrates subtle diffusion weighted hyperintensity and probable mild enhancement. This may reflect a focus of active demyelination. A subacute infarct also a differential consideration. 2)Incidentally noted 8 mm dural-based mass overlying the posterior right frontal lobe, likely a meningioma. 3)Moderate-sized right maxillary sinus mucous retention cyst  Consult placed with neurology, spoke with MD. Lyn Records, who advised to transfer the patient to [facility] hospital for further evaluation and MS work-up.  Hospitalist was consulted for admission transfer, spoke with MD Sheran Luz, who will admit and transfer the patient.     This case and patient care was discussed with attending Dr. Vanita Panda.    Final Clinical Impression(s) / ED Diagnoses Final diagnoses:   Numbness and tingling of right arm and leg    Rx / DC Orders ED Discharge Orders    None       Dyann Ruddle 04/11/20 2312    Carmin Muskrat, MD 04/12/20 1331

## 2020-04-11 NOTE — H&P (Addendum)
History and Physical    [name] CNO:[id number] DOB: Jul 28, 1975 DOA: 04/11/2020  PCP: Patient, No Pcp Per  Patient coming from: Home  I have personally briefly reviewed patient's old medical records in [place]  Chief Complaint: R arm and leg numbness  HPI: [name] is a 44 y.o. female with medical history significant of uterine fibroid and otherwise healthy.  Pt presents to the ED with c/o 2-3 day h/o brief periods of numbness in R arm and leg.  Symptoms intermittent, lasting 1-2 mins, no identifiable trigger.  No weakness.  Has occipital headaches, 8/10 in intensity, associated with numbness.  Improve with NSAIDS.  No fevers, chils, seizures, syncope, tremor.  Daughter recently diagnosed with MS, has first cousin with MS.   ED Course: MRI brain finding of demyelinating lesion that is concerning for possible new diagnosis of multiple sclerosis   Review of Systems: As per HPI, otherwise all review of systems negative.  Past Medical History:  Diagnosis Date  . Uterine fibroid     Past Surgical History:  Procedure Laterality Date  . ABDOMINAL HYSTERECTOMY     2010  . CESAREAN SECTION     x 2     reports that she has never smoked. She has never used smokeless tobacco. She reports that she does not drink alcohol and does not use drugs.  No Known Allergies  Family History  Problem Relation Age of Onset  . Cancer Mother   . HIV Father   . Multiple sclerosis Daughter   . Multiple sclerosis Cousin      Prior to Admission medications   Medication Sig Start Date End Date Taking? Authorizing Provider  cephALEXin (KEFLEX) 500 MG capsule Take 1 capsule (500 mg total) by mouth 4 (four) times daily. 06/17/19   Chase Picket, MD  cetirizine (ZYRTEC ALLERGY) 10 MG tablet Take 1 tablet (10 mg total) by mouth daily. 06/17/19   Chase Picket, MD  fluconazole (DIFLUCAN) 150 MG tablet Take 1 tablet (150 mg total) by mouth daily. Repeat in 3 days if needed  06/17/19   Lamptey, Myrene Galas, MD  hydrOXYzine (ATARAX/VISTARIL) 25 MG tablet Take 1 tablet (25 mg total) by mouth every 8 (eight) hours as needed for itching. 06/17/19   Chase Picket, MD  miconazole (MICOTIN) 2 % powder Apply topically as needed for itching. 06/17/19   Chase Picket, MD  LORATADINE PO Take by mouth.  06/17/19  [provider]    Physical Exam: Vitals:   04/11/20 1228 04/11/20 1419 04/11/20 1622 04/11/20 1836  BP: (!) 151/100 (!) 148/95 (!) 175/122 (!) 154/101  Pulse: 77 84 86 85  Resp: 16 16 17 16   Temp:  98.2 F (36.8 C)    TempSrc:  Oral    SpO2: 98% 99% 96% 99%  Weight:      Height:        Constitutional: NAD, calm, comfortable Eyes: PERRL, lids and conjunctivae normal ENMT: Mucous membranes are moist. Posterior pharynx clear of any exudate or lesions.Normal dentition.  Neck: normal, supple, no masses, no thyromegaly Respiratory: clear to auscultation bilaterally, no wheezing, no crackles. Normal respiratory effort. No accessory muscle use.  Cardiovascular: Regular rate and rhythm, no murmurs / rubs / gallops. No extremity edema. 2+ pedal pulses. No carotid bruits.  Abdomen: no tenderness, no masses palpated. No hepatosplenomegaly. Bowel sounds positive.  Musculoskeletal: no clubbing / cyanosis. No joint deformity upper and lower extremities. Good ROM, no contractures. Normal muscle  tone.  Skin: no rashes, lesions, ulcers. No induration Neurologic: CN 2-12 grossly intact. Sensation intact, DTR normal. Strength 5/5 in all 4.  Psychiatric: Normal judgment and insight. Alert and oriented x 3. Normal mood.    Labs on Admission: I have personally reviewed following labs and imaging studies  CBC: Recent Labs  Lab 04/11/20 1521  WBC 3.1*  NEUTROABS 1.4*  HGB 11.5*  HCT 40.1  MCV 74.0*  PLT 500   Basic Metabolic Panel: Recent Labs  Lab 04/11/20 1521  NA 139  K 3.8  CL 106  CO2 26  GLUCOSE 101*  BUN 10  CREATININE 0.64  CALCIUM 8.3*  MG  2.1   GFR: Estimated Creatinine Clearance: 107.9 mL/min (by C-G formula based on SCr of 0.64 mg/dL). Liver Function Tests: No results for input(s): AST, ALT, ALKPHOS, BILITOT, PROT, ALBUMIN in the last 168 hours. No results for input(s): LIPASE, AMYLASE in the last 168 hours. No results for input(s): AMMONIA in the last 168 hours. Coagulation Profile: No results for input(s): INR, PROTIME in the last 168 hours. Cardiac Enzymes: No results for input(s): CKTOTAL, CKMB, CKMBINDEX, TROPONINI in the last 168 hours. BNP (last 3 results) No results for input(s): PROBNP in the last 8760 hours. HbA1C: No results for input(s): HGBA1C in the last 72 hours. CBG: No results for input(s): GLUCAP in the last 168 hours. Lipid Profile: No results for input(s): CHOL, HDL, LDLCALC, TRIG, CHOLHDL, LDLDIRECT in the last 72 hours. Thyroid Function Tests: No results for input(s): TSH, T4TOTAL, FREET4, T3FREE, THYROIDAB in the last 72 hours. Anemia Panel: No results for input(s): VITAMINB12, FOLATE, FERRITIN, TIBC, IRON, RETICCTPCT in the last 72 hours. Urine analysis:    Component Value Date/Time   COLORURINE YELLOW 07/13/2017 0346   APPEARANCEUR CLEAR 07/13/2017 0346   LABSPEC 1.015 07/13/2017 0346   PHURINE 6.0 07/13/2017 0346   GLUCOSEU NEGATIVE 07/13/2017 0346   HGBUR SMALL (A) 07/13/2017 0346   BILIRUBINUR NEGATIVE 07/13/2017 0346   KETONESUR NEGATIVE 07/13/2017 0346   PROTEINUR NEGATIVE 07/13/2017 0346   UROBILINOGEN 0.2 10/11/2011 1505   NITRITE NEGATIVE 07/13/2017 0346   LEUKOCYTESUR TRACE (A) 07/13/2017 0346    Radiological Exams on Admission: MR Brain W and Wo Contrast  Result Date: 04/11/2020 CLINICAL DATA:  Multiple sclerosis, new event. Additional provided: Patient reports 2-3 days of brief periods of numbness in right arm and leg. EXAM: MRI HEAD WITHOUT AND WITH CONTRAST TECHNIQUE: Multiplanar, multiecho pulse sequences of the brain and surrounding structures were obtained without  and with intravenous contrast. CONTRAST:  70mL GADAVIST GADOBUTROL 1 MMOL/ML IV SOLN COMPARISON:  No pertinent prior exams available for comparison. FINDINGS: Brain: Cerebral volume is normal for age. Ill-defined and scattered T2/FLAIR hyperintensity within the cerebral white matter, pons and right midbrain, nonspecific but possibly reflecting sequela of demyelinating disease given the provided history of multiple sclerosis. A 5 mm lesion within the right parietal white matter demonstrates subtle diffusion weighted hyperintensity and probable mild enhancement (series 3, image 28) (series 13, image 8). No enhancement identified elsewhere within the brain. 8 mm dural-based homogeneously enhancing mass overlying the posterior right frontal lobe likely reflecting a small incidental meningioma (series 13, image 13) (series 3, image 47). This mass abuts the superior sagittal sinus. No chronic intracranial blood products. No extra-axial fluid collection. No midline shift. Partially empty sella turcica. Vascular: Expected proximal arterial flow voids. Skull and upper cervical spine: No focal marrow lesion. Sinuses/Orbits: Visualized orbits show no acute finding. Trace ethmoid sinus mucosal thickening.  Moderate-sized right maxillary sinus mucous retention cyst. These results will be called to the ordering clinician or representative by the Radiologist Assistant, and communication documented in the PACS or [employer]. IMPRESSION: Ill-defined and scattered T2/FLAIR hyperintensity within the cerebral white matter, pons and right midbrain is nonspecific, but may reflect sequela of demyelinating disease given the provided history multiple sclerosis. A 5 mm lesion within the right parietal white matter demonstrates subtle diffusion weighted hyperintensity and probable mild enhancement. This may reflect a focus of active demyelination. A subacute infarct also a differential consideration. Incidentally noted 8 mm dural-based  mass overlying the posterior right frontal lobe, likely a meningioma. Moderate-sized right maxillary sinus mucous retention cyst Electronically Signed   By: Kellie Simmering DO   On: 04/11/2020 18:34    EKG: None in epic.  Assessment/Plan Principal Problem:   Multiple sclerosis ([place])    1. Brain lesions on MRI - concerning for MS, DDx includes subacute infarct. 1. MRI findings as above, concerning for MS especially given the lack of stroke risk factors and the extensive family history of MS. 2. Neurology wants pt admitted over to [facility] so they can evaluate in consultation 3. Neurology recommends no steroids yet at this time. 4. Tele monitor 5. Check A1C, FLP 6. Checking copper, zinc, B12, HIV, and MMA  DVT prophylaxis: Lovenox Code Status: Full Family Communication: No family in room Disposition Plan: Home after MS work up and treatment Consults called: neurology Admission status: Admit to inpatient  Severity of Illness: The appropriate patient status for this patient is INPATIENT. Inpatient status is judged to be reasonable and necessary in order to provide the required intensity of service to ensure the patient's safety. The patient's presenting symptoms, physical exam findings, and initial radiographic and laboratory data in the context of their chronic comorbidities is felt to place them at high risk for further clinical deterioration. Furthermore, it is not anticipated that the patient will be medically stable for discharge from the hospital within 2 midnights of admission. The following factors support the patient status of inpatient.   IP status for new demyelinating lesions seen on MRI brain, findings concerning for new diagnosis of multiple sclerosis.  * I certify that at the point of admission it is my clinical judgment that the patient will require inpatient hospital care spanning beyond 2 midnights from the point of admission due to high intensity of service, high risk for further  deterioration and high frequency of surveillance required.*    Makeshia Seat M. DO [facility]  How to contact the [facility] Attending or Consulting provider [place] or covering provider during after hours [place], for this patient?  1. Check the care team in [facility] and look for a) attending/consulting [facility] provider listed and b) the [facility] team listed 2. Log into [email or web address]  Amion Physician Scheduling and messaging for groups and whole hospitals  On call and physician scheduling software for group practices, residents, hospitalists and other medical providers for call, clinic, rotation and shift schedules. OnCall [employer] is a hospital-wide system for scheduling doctors and paging doctors on call. EasyPlot is for scientific plotting and data analysis.  [email or web address]  and use [place]'s universal password to access. If you do not have the password, please contact the hospital operator.  3. Locate the [facility] provider you are looking for under [facility] and page to a number that you can be directly reached. 4. If you still have difficulty reaching the provider, please page the [facility] (Director  on Call) for the Hospitalists listed on amion for assistance.  04/11/2020, 8:39 PM

## 2020-04-11 NOTE — ED Notes (Signed)
[facility] contacted for patient transport

## 2020-04-11 NOTE — ED Notes (Signed)
Attempted to give report, RN stated he needed a few minutes and will call back. [facility] has left with patient at this time.

## 2020-04-11 NOTE — ED Notes (Signed)
[facility] present to transport pt to MRI at [facility].

## 2020-04-11 NOTE — ED Notes (Signed)
Pt returned via [facility] to Rm 5

## 2020-04-11 NOTE — ED Triage Notes (Signed)
States for 2-3 days she has been having brief periods of numbness in her rt arm and leg. Notices both when standing and lying down. Not severe enough to cause her to fall.

## 2020-04-11 NOTE — Plan of Care (Signed)

## 2020-04-12 ENCOUNTER — Inpatient Hospital Stay (HOSPITAL_COMMUNITY): Payer: BC Managed Care – PPO

## 2020-04-12 ENCOUNTER — Encounter (HOSPITAL_COMMUNITY): Payer: BC Managed Care – PPO

## 2020-04-12 ENCOUNTER — Other Ambulatory Visit (HOSPITAL_COMMUNITY): Payer: BC Managed Care – PPO

## 2020-04-12 DIAGNOSIS — R202 Paresthesia of skin: Secondary | ICD-10-CM

## 2020-04-12 DIAGNOSIS — R200 Anesthesia of skin: Secondary | ICD-10-CM

## 2020-04-12 LAB — CBC
HCT: 35.8 % — ABNORMAL LOW (ref 36.0–46.0)
Hemoglobin: 11 g/dL — ABNORMAL LOW (ref 12.0–15.0)
MCH: 21.6 pg — ABNORMAL LOW (ref 26.0–34.0)
MCHC: 30.7 g/dL (ref 30.0–36.0)
MCV: 70.3 fL — ABNORMAL LOW (ref 80.0–100.0)
Platelets: 304 10*3/uL (ref 150–400)
RBC: 5.09 MIL/uL (ref 3.87–5.11)
RDW: 15.4 % (ref 11.5–15.5)
WBC: 3.3 10*3/uL — ABNORMAL LOW (ref 4.0–10.5)
nRBC: 0 % (ref 0.0–0.2)

## 2020-04-12 LAB — VITAMIN B12: Vitamin B-12: 316 pg/mL (ref 180–914)

## 2020-04-12 LAB — HIV ANTIBODY (ROUTINE TESTING W REFLEX): HIV Screen 4th Generation wRfx: NONREACTIVE

## 2020-04-12 MED ORDER — POLYETHYLENE GLYCOL 3350 17 G PO PACK
17.0000 g | PACK | Freq: Every day | ORAL | Status: DC | PRN
  Administered 2020-04-13: 17 g via ORAL
  Filled 2020-04-12 (×2): qty 1

## 2020-04-12 MED ORDER — DIPHENHYDRAMINE HCL 50 MG/ML IJ SOLN
25.0000 mg | Freq: Once | INTRAMUSCULAR | Status: AC
  Administered 2020-04-12: 25 mg via INTRAVENOUS
  Filled 2020-04-12: qty 1

## 2020-04-12 MED ORDER — KETOROLAC TROMETHAMINE 30 MG/ML IJ SOLN
30.0000 mg | Freq: Once | INTRAMUSCULAR | Status: AC
  Administered 2020-04-12: 30 mg via INTRAVENOUS
  Filled 2020-04-12: qty 1

## 2020-04-12 MED ORDER — METOCLOPRAMIDE HCL 5 MG/ML IJ SOLN
5.0000 mg | Freq: Once | INTRAMUSCULAR | Status: AC
  Administered 2020-04-12: 5 mg via INTRAVENOUS
  Filled 2020-04-12: qty 2

## 2020-04-12 NOTE — Progress Notes (Signed)
EEG complete - results pending 

## 2020-04-12 NOTE — Progress Notes (Signed)
PROGRESS NOTE    [name]  MBW:[id number] DOB: January 07, 1976 DOA: 04/11/2020 PCP: Patient, No Pcp Per    Brief Narrative:  44 y.o. female with medical history significant of uterine fibroid and otherwise healthy.  Pt presents to the ED with c/o 2-3 day h/o brief periods of numbness in R arm and leg.  Symptoms intermittent, lasting 1-2 mins, no identifiable trigger.  No weakness.  Has occipital headaches, 8/10 in intensity, associated with numbness. Headache improved with NSAIDs  Assessment & Plan:   Principal Problem:   Numbness and tingling of right arm and leg   1. Brain lesions on MRI with numbness 1. Initial findings are concerning for MS with 24mm lesion seen and reviewed on MRI brain 2. Neurology consulted and is following. Initial concern for possible MS, however now focus is on possible seizure 3. EEG performed and reviewed, neg for seizure activity 4. Will f/u on Neurology recs 2. Headaches 1. Pt reports long hx of headaches described as over frontal region with photophobia. No nausea or aura 2. This AM, pt reports an acute 8/10 headache worse with sunlight, improved with closing blinds 3. Have given trial of toradol, reglan, benadryl with resolution of headache  DVT prophylaxis: Lovenox subq Code Status: Full Family Communication: Pt not at bedside  Status is: Inpatient  Remains inpatient appropriate because:Ongoing diagnostic testing needed not appropriate for outpatient work up and Inpatient level of care appropriate due to severity of illness   Dispo: The patient is from: Home              Anticipated d/c is to: Home              Anticipated d/c date is: 2 days              Patient currently is not medically stable to d/c.       Consultants:   Neurology  Procedures:     Antimicrobials: Anti-infectives (From admission, onward)   None       Subjective: Reports 8/10 headache worse with bright light  Objective: Vitals:   04/11/20 1622  04/11/20 1836 04/11/20 2252 04/12/20 0450  BP: (!) 175/122 (!) 154/101 (!) 151/104 133/89  Pulse: 86 85 82 79  Resp: 17 16 19 18   Temp:   98.1 F (36.7 C) 98.3 F (36.8 C)  TempSrc:   Oral Oral  SpO2: 96% 99% 100%   Weight:      Height:       No intake or output data in the 24 hours ending 04/12/20 1609 Filed Weights   04/11/20 0945  Weight: 90.7 kg    Examination:  General exam: Appears calm and comfortable  Respiratory system: Clear to auscultation. Respiratory effort normal. Cardiovascular system: S1 & S2 heard, Regular Gastrointestinal system: Abdomen is nondistended, soft and nontender. No organomegaly or masses felt. Normal bowel sounds heard. Central nervous system: Alert and oriented. No focal neurological deficits. Extremities: Symmetric 5 x 5 power. Skin: No rashes, lesions Psychiatry: Judgement and insight appear normal. Mood & affect appropriate.   Data Reviewed: I have personally reviewed following labs and imaging studies  CBC: Recent Labs  Lab 04/11/20 1521 04/12/20 0536  WBC 3.1* 3.3*  NEUTROABS 1.4*  --   HGB 11.5* 11.0*  HCT 40.1 35.8*  MCV 74.0* 70.3*  PLT 311 017   Basic Metabolic Panel: Recent Labs  Lab 04/11/20 1521  NA 139  K 3.8  CL 106  CO2 26  GLUCOSE 101*  BUN 10  CREATININE 0.64  CALCIUM 8.3*  MG 2.1   GFR: Estimated Creatinine Clearance: 107.9 mL/min (by C-G formula based on SCr of 0.64 mg/dL). Liver Function Tests: No results for input(s): AST, ALT, ALKPHOS, BILITOT, PROT, ALBUMIN in the last 168 hours. No results for input(s): LIPASE, AMYLASE in the last 168 hours. No results for input(s): AMMONIA in the last 168 hours. Coagulation Profile: No results for input(s): INR, PROTIME in the last 168 hours. Cardiac Enzymes: No results for input(s): CKTOTAL, CKMB, CKMBINDEX, TROPONINI in the last 168 hours. BNP (last 3 results) No results for input(s): PROBNP in the last 8760 hours. HbA1C: Recent Labs    04/11/20 2048   HGBA1C 5.9*   CBG: No results for input(s): GLUCAP in the last 168 hours. Lipid Profile: Recent Labs    04/11/20 2048  CHOL 118  HDL 37*  LDLCALC 72  TRIG 43  CHOLHDL 3.2   Thyroid Function Tests: No results for input(s): TSH, T4TOTAL, FREET4, T3FREE, THYROIDAB in the last 72 hours. Anemia Panel: Recent Labs    04/12/20 0536  VITAMINB12 316   Sepsis Labs: No results for input(s): PROCALCITON, LATICACIDVEN in the last 168 hours.  Recent Results (from the past 240 hour(s))  Resp Panel by RT-PCR (Flu A&B, Covid) Nasopharyngeal Swab     Status: None   Collection Time: 04/11/20  6:51 PM   Specimen: Nasopharyngeal Swab; Nasopharyngeal(NP) swabs in vial transport medium  Result Value Ref Range Status   SARS Coronavirus 2 by RT PCR NEGATIVE NEGATIVE Final    Comment: (NOTE) SARS-CoV-2 target nucleic acids are NOT DETECTED.  The SARS-CoV-2 RNA is generally detectable in upper respiratory specimens during the acute phase of infection. The lowest concentration of SARS-CoV-2 viral copies this assay can detect is 138 copies/mL. A negative result does not preclude SARS-Cov-2 infection and should not be used as the sole basis for treatment or other patient management decisions. A negative result may occur with  improper specimen collection/handling, submission of specimen other than nasopharyngeal swab, presence of viral mutation(s) within the areas targeted by this assay, and inadequate number of viral copies(<138 copies/mL). A negative result must be combined with clinical observations, patient history, and epidemiological information. The expected result is Negative.  Fact Sheet for Patients:  EntrepreneurPulse.com.au  Fact Sheet for Healthcare Providers:  IncredibleEmployment.be  This test is no t yet approved or cleared by the [place] FDA and  has been authorized for detection and/or diagnosis of SARS-CoV-2 by FDA under an  Emergency Use Authorization (EUA). This EUA will remain  in effect (meaning this test can be used) for the duration of the COVID-19 declaration under Section 564(b)(1) of the Act, 21 U.S.C.section 360bbb-3(b)(1), unless the authorization is terminated  or revoked sooner.       Influenza A by PCR NEGATIVE NEGATIVE Final   Influenza B by PCR NEGATIVE NEGATIVE Final    Comment: (NOTE) The Xpert Xpress SARS-CoV-2/FLU/RSV plus assay is intended as an aid in the diagnosis of influenza from Nasopharyngeal swab specimens and should not be used as a sole basis for treatment. Nasal washings and aspirates are unacceptable for Xpert Xpress SARS-CoV-2/FLU/RSV testing.  Fact Sheet for Patients: EntrepreneurPulse.com.au  Fact Sheet for Healthcare Providers: IncredibleEmployment.be  This test is not yet approved or cleared by the [place] FDA and has been authorized for detection and/or diagnosis of SARS-CoV-2 by FDA under an Emergency Use Authorization (EUA). This EUA will remain in effect (meaning this test can be used) for  the duration of the COVID-19 declaration under Section 564(b)(1) of the Act, 21 U.S.C. section 360bbb-3(b)(1), unless the authorization is terminated or revoked.  Performed at [facility], [place] [address] [place], [place] [address]      Radiology Studies: MR Brain W and Wo Contrast  Result Date: 04/11/2020 CLINICAL DATA:  Multiple sclerosis, new event. Additional provided: Patient reports 2-3 days of brief periods of numbness in right arm and leg. EXAM: MRI HEAD WITHOUT AND WITH CONTRAST TECHNIQUE: Multiplanar, multiecho pulse sequences of the brain and surrounding structures were obtained without and with intravenous contrast. CONTRAST:  31mL GADAVIST GADOBUTROL 1 MMOL/ML IV SOLN COMPARISON:  No pertinent prior exams available for comparison. FINDINGS: Brain: Cerebral volume is normal for age. Ill-defined and  scattered T2/FLAIR hyperintensity within the cerebral white matter, pons and right midbrain, nonspecific but possibly reflecting sequela of demyelinating disease given the provided history of multiple sclerosis. A 5 mm lesion within the right parietal white matter demonstrates subtle diffusion weighted hyperintensity and probable mild enhancement (series 3, image 28) (series 13, image 8). No enhancement identified elsewhere within the brain. 8 mm dural-based homogeneously enhancing mass overlying the posterior right frontal lobe likely reflecting a small incidental meningioma (series 13, image 13) (series 3, image 47). This mass abuts the superior sagittal sinus. No chronic intracranial blood products. No extra-axial fluid collection. No midline shift. Partially empty sella turcica. Vascular: Expected proximal arterial flow voids. Skull and upper cervical spine: No focal marrow lesion. Sinuses/Orbits: Visualized orbits show no acute finding. Trace ethmoid sinus mucosal thickening. Moderate-sized right maxillary sinus mucous retention cyst. These results will be called to the ordering clinician or representative by the Radiologist Assistant, and communication documented in the PACS or [employer]. IMPRESSION: Ill-defined and scattered T2/FLAIR hyperintensity within the cerebral white matter, pons and right midbrain is nonspecific, but may reflect sequela of demyelinating disease given the provided history multiple sclerosis. A 5 mm lesion within the right parietal white matter demonstrates subtle diffusion weighted hyperintensity and probable mild enhancement. This may reflect a focus of active demyelination. A subacute infarct also a differential consideration. Incidentally noted 8 mm dural-based mass overlying the posterior right frontal lobe, likely a meningioma. Moderate-sized right maxillary sinus mucous retention cyst Electronically Signed   By: Kellie Simmering DO   On: 04/11/2020 18:34   EEG  adult  Result Date: 04/12/2020 Lora Havens, MD     04/12/2020  4:04 PM Patient Name: [name] MRN: [id number] Epilepsy Attending: Lora Havens Referring Physician/Provider: Dr Lesleigh Noe Date: 04/12/2020 Duration: 31.02 mins Patient history: 44 y.o. right-handed woman with no significant past medical history presenting with episodes of right arm and leg sensory symptoms. These are intermittent in nature but have been occurring more frequently in the past few days concerning for focal seizure. EEG to evaluate for seizure. Level of alertness: Awake,asleep AEDs during EEG study: None Technical aspects: This EEG study was done with scalp electrodes positioned according to the 10-20 International system of electrode placement. Electrical activity was acquired at a sampling rate of 500Hz  and reviewed with a high frequency filter of 70Hz  and a low frequency filter of 1Hz . EEG data were recorded continuously and digitally stored. Description: The posterior dominant rhythm consists of 111 Hz activity of moderate voltage (25-35 uV) seen predominantly in posterior head regions, symmetric and reactive to eye opening and eye closing. Sleep was characterized by vertex waves, sleep spindles (12 to 14 Hz), maximal frontocentral region.  Hyperventilation did  not show any EEG change.  Physiologic photic driving was not seen during photic stimulation.   IMPRESSION: This study is within normal limits. No seizures or epileptiform discharges were seen throughout the recording. Priyanka Barbra Sarks    Scheduled Meds: . enoxaparin (LOVENOX) injection  40 mg Subcutaneous Q24H   Continuous Infusions:   LOS: 1 day   Marylu Lund, MD [facility] Pager On Amion  If 7PM-7AM, please contact night-coverage 04/12/2020, 4:09 PM

## 2020-04-12 NOTE — Consult Note (Signed)
Neurology Consultation Reason for Consult: Concern for multiple sclerosis Requesting Physician: Dr. Vanita Panda  CC: Right arm and leg paresthesias  History is obtained from: Patient  HPI: [name] is a 44 y.o. female with a past medical history significant for uterine fibroids status post hysterectomy.  She reports that she has been in her usual state of health other than some stressors around her daughter recently being diagnosed with multiple sclerosis.  Several days ago she began to have episodes of right sided numbness/tingling/heaviness that start in the shoulder and work its way down to her hand and also start in her hip and work its way down to her feet.  She thinks typically the arm and leg symptoms occur at the same time although sometimes perhaps they occur independently.  They last 1 to 2 minutes.  There is no clear trigger.  They were initially happening only 2-3 times a day but are now happening up to 10 times a day and waking her from sleep 2-3 times at night.  She does have some chronic low back pain and initially thought that the leg symptoms may be related to that but realized that that would not explain her arm symptoms.  She presented to the emergency department for evaluation at the behest of family who were concerned about the potential for having had a stroke.  MRI imaging revealed concern for potential demyelinating lesion and neurology was consulted for further management.  On detailed history she was able to recall that when she was in her 61s she had very similar symptoms.  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  However the episodes again were not clearly triggered by any specific activity and would happen even when she was off the job or doing other things.  She does not recall all the details of her episodes at that time but recall having such painful right arm vibrating/numbness  sensations that she would fall on the floor and be unable to get up for up to 5 to 10 minutes.  At that time sometimes her left arm would be involved as well.  Currently only her right arm and leg are involved.  Regarding her daughter's multiple sclerosis diagnosis, she began to have symptoms back in August with diplopia, fatigue, and facial droop.  Outpatient MRI was obtained by ophthalmology.  However she experienced a significant flare just before Thanksgiving where she lost ability to walk and required admission for pulse dose steroids.  Review of systems is otherwise notable for some stomach complaints where she reports her stomach will feel "hot" and she will feel like she needs to burp excessively.  She feels that this improves with fasting.  She additionally has had headaches that are either occipital or frontal that started in her 90s.  She describes a headache as sometimes a "crackling" sensation lasting 2 to 3 hours, typically alleviated with ibuprofen/Aleve, happening 3-4 times a week up to twice a day, 8 out of 10 in intensity.  There are no clear triggers to her headache and she does not feel that they're typically worse in the morning or at night.  Of note she specifically denies any other focal neurological symptoms including double vision, loss of color vision, changes in her hearing, episodes of focal sustained sensory loss or weakness that eventually remitted, electric shock sensations when bending her neck, symptoms worsened with heat exposure or exercise, bowel or bladder  symptoms, incontinence, tongue biting, shaking.  She denies any history of TB exposure or foreign travel; she has spent her life between [place] and [place].  She reports that she prefers to use natural treatments and avoids unnecessary medications.  She reports she has been using zinc for immunity boosting purposes but reports that this is not excessive use and only when somebody around her specifically is ill.    ROS: A 14 point ROS was performed and is negative except as noted in the HPI.   Past Medical History:  Diagnosis Date  . Uterine fibroid    Family History  Problem Relation Age of Onset  . Cancer Mother   . HIV Father   . Multiple sclerosis Daughter   . Multiple sclerosis Cousin    Social History:  reports that she has never smoked. She has never used smokeless tobacco. She reports that she does not drink alcohol and does not use drugs.  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  She has 4 children.  Exam: Current vital signs: BP (!) 151/104 (BP Location: Left Arm)   Pulse 82   Temp 98.1 F (36.7 C) (Oral)   Resp 19   Ht 5\' 8"  (1.727 m)   Wt 90.7 kg   SpO2 100%   BMI 30.41 kg/m  Vital signs in last 24 hours: Temp:  [98.1 F (36.7 C)-98.4 F (36.9 C)] 98.1 F (36.7 C) (12/04 2252) Pulse Rate:  [77-102] 82 (12/04 2252) Resp:  [16-19] 19 (12/04 2252) BP: (138-175)/(92-122) 151/104 (12/04 2252) SpO2:  [96 %-100 %] 100 % (12/04 2252) Weight:  [90.7 kg] 90.7 kg (12/04 0945)   Physical Exam  Constitutional: Appears well-developed and well-nourished.  Psych: Affect appropriate to situation Eyes: No scleral injection HENT: No OP obstruction MSK: no joint deformities.  No tenderness to palpation other than very slightly in the low lumbosacral spine Cardiovascular: Normal rate and regular rhythm.  Respiratory: Effort normal, non-labored breathing GI: Soft.  No distension. There is no tenderness.  Skin: WDI  Neuro: Mental Status: Patient is awake, alert, oriented to person, place, month, year, and situation. Patient is able to give a clear and coherent history. No signs of aphasia or neglect Cranial Nerves: II: Visual Fields are full. Pupils are equal, round, and reactive to light.  There is no APD.  Funduscopic exam reveals normal disks bilaterally III,IV, VI: EOMI without ptosis or diploplia.  V: Facial sensation is symmetric to temperature VII: Facial movement is  symmetric.  VIII: hearing is intact to voice X: Uvula elevates symmetrically XI: Shoulder shrug is symmetric. XII: tongue is midline without atrophy or fasciculations.  Motor: Tone is normal. Bulk is normal. 5/5 strength was present in all four extremities.  Sensory: Sensation is symmetric to light touch and temperature in the arms and legs, other than during an acute event as mentioned below.  Vibration is mildly diminished at the bilateral toes, left more than right (9 seconds and 7 seconds respectively) Deep Tendon Reflexes: 2+ and symmetric in the biceps and patellae.  Plantars: Toes are downgoing bilaterally.  Cerebellar: FNF and HKS are intact bilaterally Gait:  Able to rise on heels and toes, able to walk and tandem gait steadily  During the course of my examination, the patient began to experience 1 of these events.  She was first rubbing her shoulder, then rubbing down her forearm and into her hand, all on the ulnar side.  As described, the episode lasted several minutes and then  resolved without sequelae.  During the episode there was hypoesthesia on the ulnar side where she was having the sensory symptoms.   I have reviewed labs in epic and the results pertinent to this consultation are: Hemoglobin A1c 5.9 Lipid panel notable for LDL of 72 New leukopenia of 3.1 (previously baseline appears to be around 6) Mild anemia 11.5, microcytic but with an elevated RDW  I have reviewed the images obtained:  T2 post con and T1 post-con respectively,      Impression: This is a 44 y.o. right-handed woman with no significant past medical history presenting with episodes of right arm and leg sensory symptoms.  These are intermittent in nature but have been occurring more frequently in the past few days.  Phenomenology is more consistent with potential focal seizure than with a multiple sclerosis flare.  Suspect both the meningioma and the right parietal lesion seen are incidental  findings, as are the nonspecific T2/FLAIR hyperintensities, as they would not explain right-sided symptoms.  It is unclear to me why she had these events even more intensely in her 70s and then experienced a long period of remission.  Nevertheless, given the potential for focal seizures to have secondary generalization, as well as a significant adverse effects of AEDs, with the fact that these events are occurring frequently at this time, spell capture on EEG will be useful to guide further management.  Recommendations:  #Spells of altered sensation concerning for focal seizure -EEG monitoring with goal of spell capture -Potential to start antiseizure medication pending EEG results -Screening for potential metabolic/nutritional causes for altered sensation:   -B12, MMA (given anemia with elevated RDW)  -Copper, zinc (given potential for excessive zinc supplementation)  -HIV (given new leukopenia) Lesleigh Noe MD-PhD [facility] [phone]

## 2020-04-12 NOTE — Procedures (Addendum)
Patient Name: [name]  MRN: [id number]  Epilepsy Attending: Lora Havens  Referring Physician/Provider: Dr Lesleigh Noe Date: 04/12/2020 Duration: 31.02 mins  Patient history: 44 y.o. right-handed woman with no significant past medical history presenting with episodes of right arm and leg sensory symptoms. These are intermittent in nature but have been occurring more frequently in the past few days concerning for focal seizure. EEG to evaluate for seizure.   Level of alertness: Awake,asleep  AEDs during EEG study: None  Technical aspects: This EEG study was done with scalp electrodes positioned according to the 10-20 International system of electrode placement. Electrical activity was acquired at a sampling rate of 500Hz  and reviewed with a high frequency filter of 70Hz  and a low frequency filter of 1Hz . EEG data were recorded continuously and digitally stored.   Description: The posterior dominant rhythm consists of 111 Hz activity of moderate voltage (25-35 uV) seen predominantly in posterior head regions, symmetric and reactive to eye opening and eye closing. Sleep was characterized by vertex waves, sleep spindles (12 to 14 Hz), maximal frontocentral region.  Hyperventilation did not show any EEG change.  Physiologic photic driving was not seen during photic stimulation.     IMPRESSION: This study is within normal limits. No seizures or epileptiform discharges were seen throughout the recording.  [name]

## 2020-04-13 ENCOUNTER — Inpatient Hospital Stay (HOSPITAL_COMMUNITY): Payer: BC Managed Care – PPO

## 2020-04-13 IMAGING — MR MR HEAD W/O CM
6 of 8 series · 32 of 48 positions shown · non-contrast
Comparison: MRI brain [DATE]

CLINICAL DATA: Paresthesia. Numbness and tingling right arm and
leg.

EXAM:
MRI HEAD WITHOUT CONTRAST
TECHNIQUE: Multiplanar, multiecho pulse sequences of the brain and surrounding
structures were obtained without intravenous contrast.

[Series 3: DWI · axial · 3.0mm · 1.09mm/px · z∈[-100,+49]mm · 7 of 104 slices shown (1 of 4)]
[im 1/104]
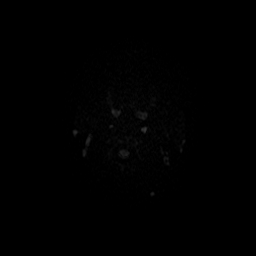
[im 18/104]
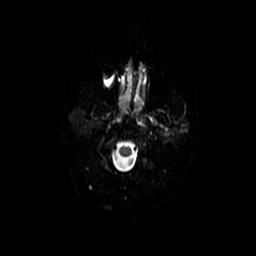
[im 35/104]
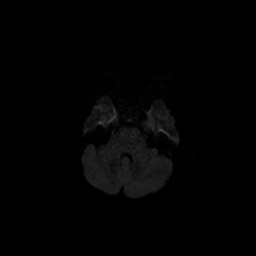
[im 52/104]
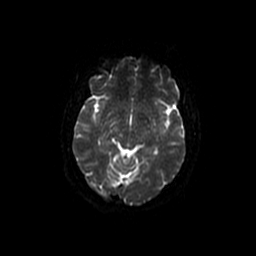
[im 69/104]
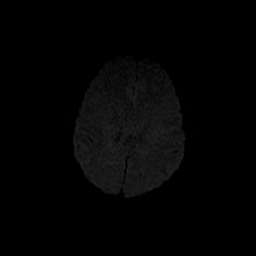
[im 86/104]
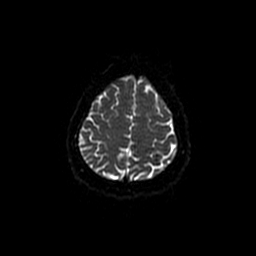
[im 104/104]
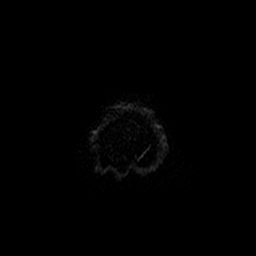

[Series 4: DWI · coronal · 5.0mm · 1.09mm/px · 5 of 74 slices shown (2 of 4)]
[im 1/74]
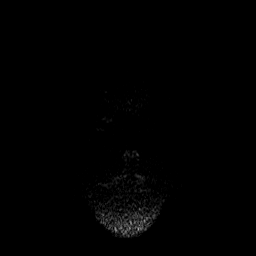
[im 19/74]
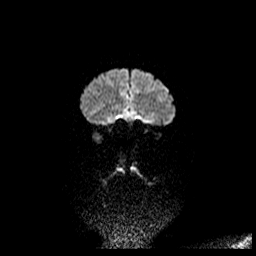
[im 37/74]
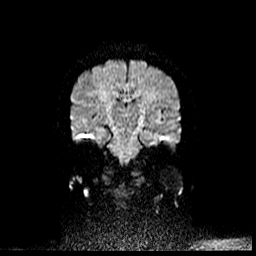
[im 55/74]
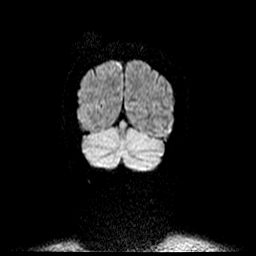
[im 74/74]
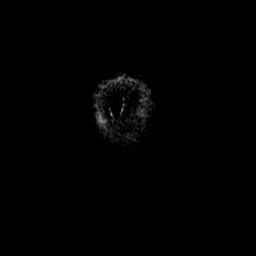

[Series 14: FLAIR · sagittal · 1.2mm · 0.49mm/px · 9 of 256 slices shown]
[im 15/256]
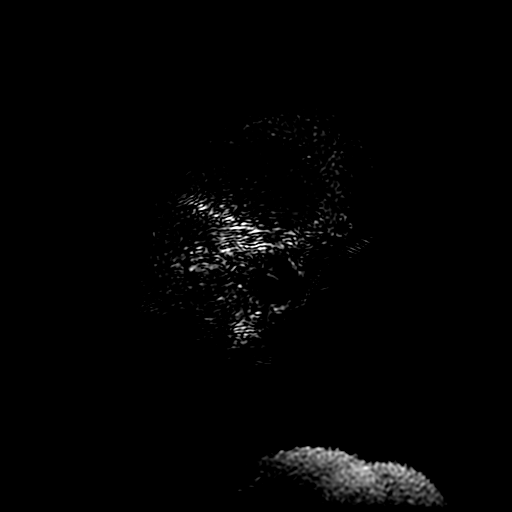
[im 43/256]
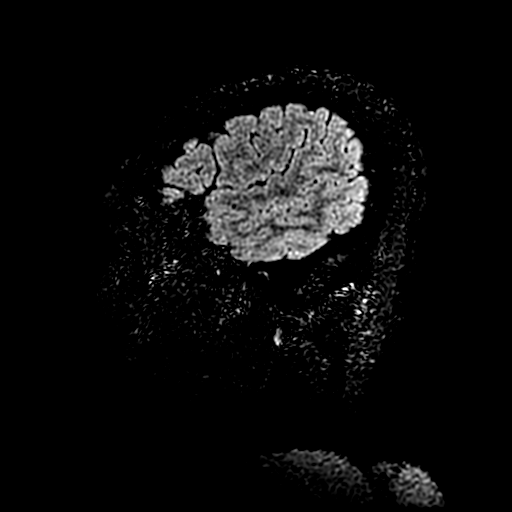
[im 71/256]
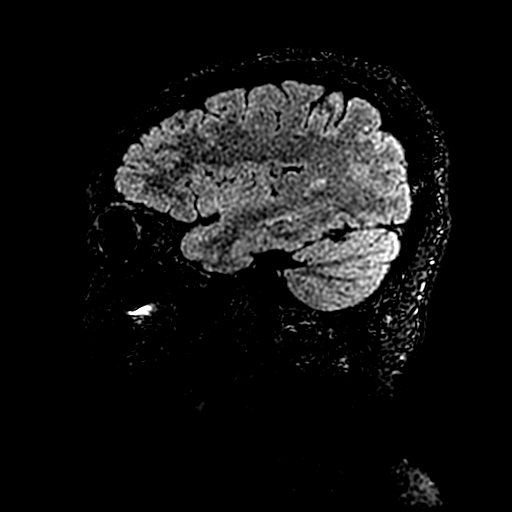
[im 114/256]
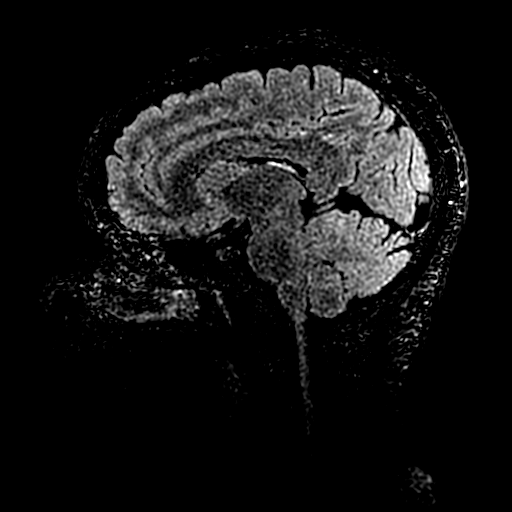
[im 128/256]
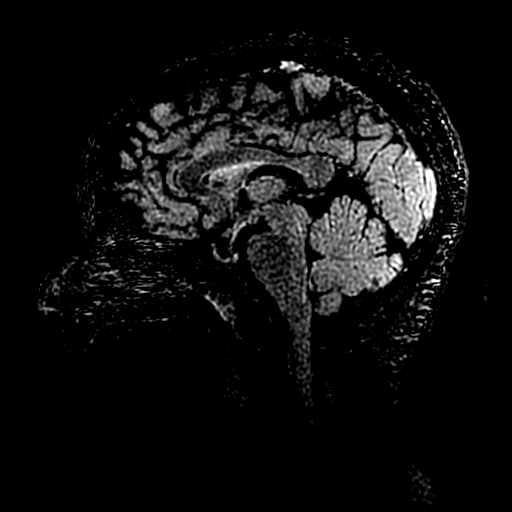
[im 142/256]
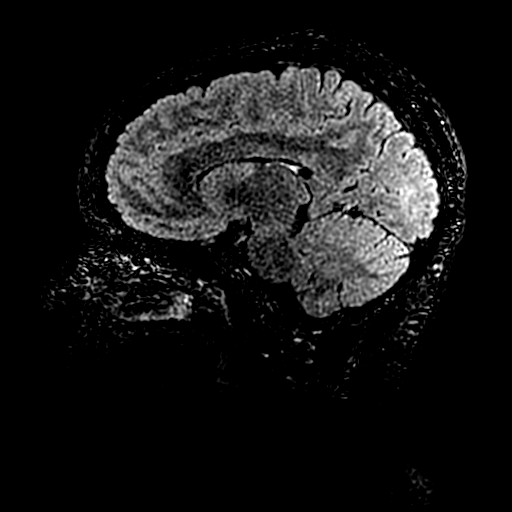
[im 185/256]
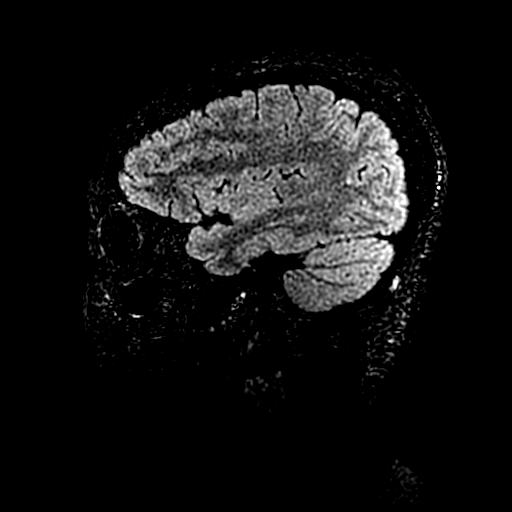
[im 213/256]
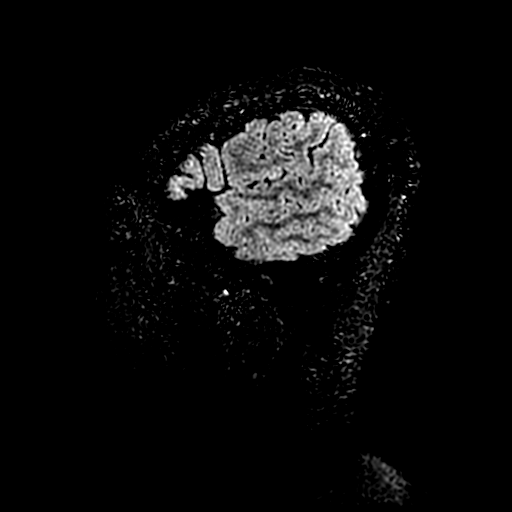
[im 241/256]
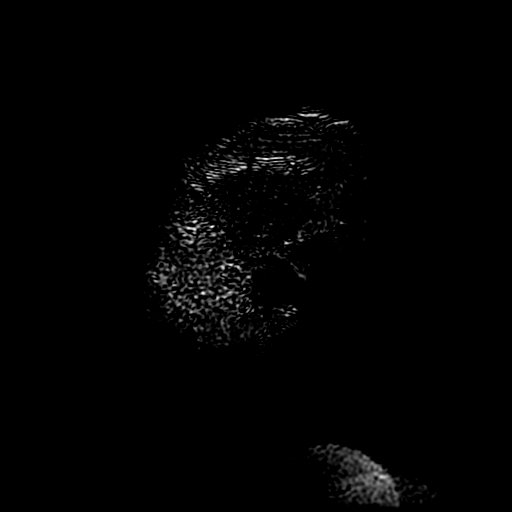

[Series 300: DWI · axial · 3.0mm · 1.09mm/px · z∈[-100,+49]mm · 4 of 52 slices shown (3 of 4)]
[im 1/52]
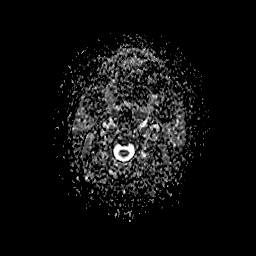
[im 18/52]
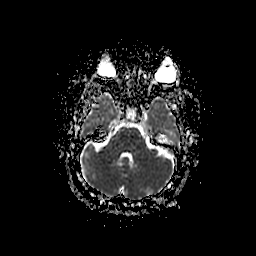
[im 35/52]
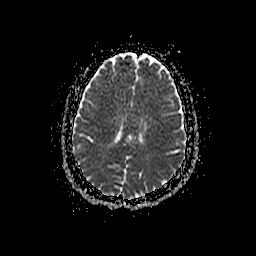
[im 52/52]
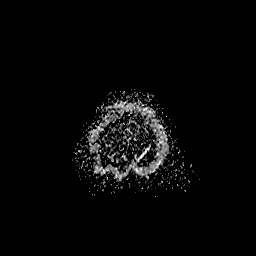

[Series 400: DWI · coronal · 5.0mm · 1.09mm/px · 3 of 37 slices shown (4 of 4)]
[im 1/37]
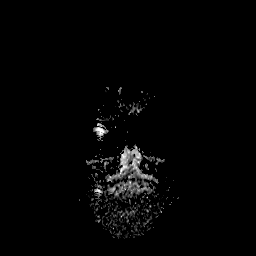
[im 19/37]
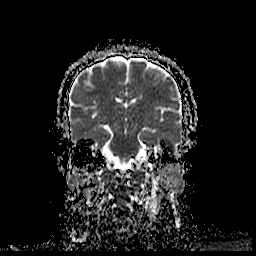
[im 37/37]
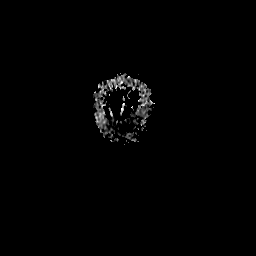

[Series 1400: multiplanar reconstruction (mpr) · axial · 5.0mm · 0.49mm/px · z∈[-181,+54]mm · 4 of 51 slices shown]
[im 1/51]
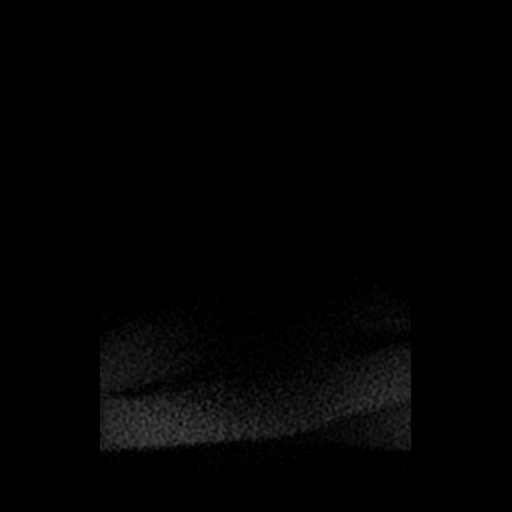
[im 17/51]
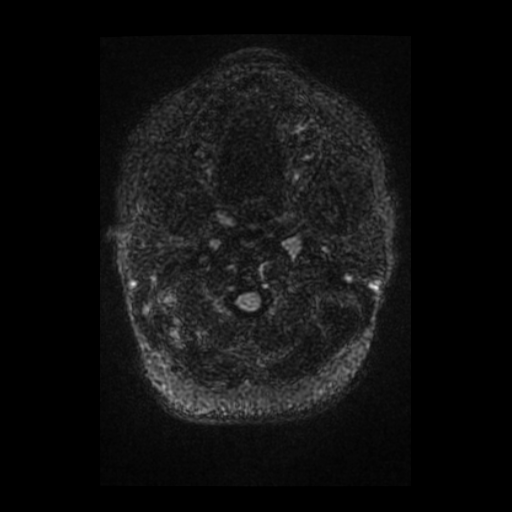
[im 34/51]
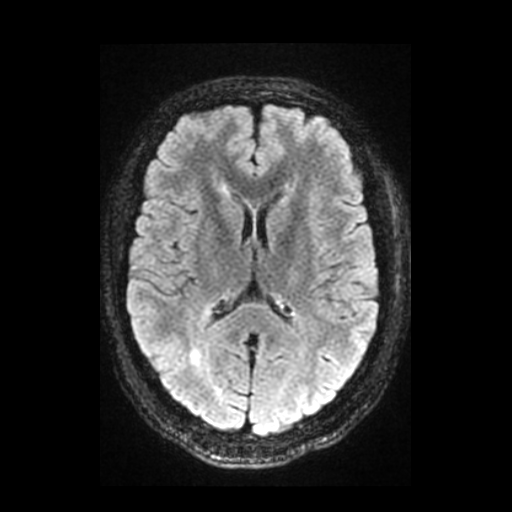
[im 51/51]
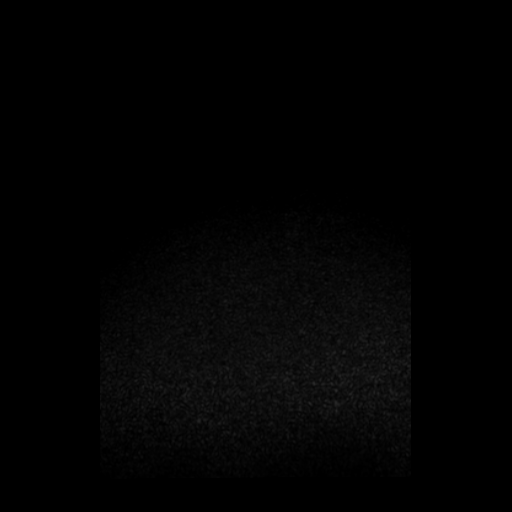

[32 of 48 positions shown; findings below may reference images not displayed]

FINDINGS: Brain: Limited protocol per Dr. UBAYT. Diffusion-weighted
imaging and FLAIR imaging repeated.

Diffusion hyperintensity right parietal white matter on the prior
study no longer identified. No areas of restricted diffusion.

Mild periventricular white matter hyperintensities bilaterally
similar to the prior study. Hyperintensity in the right lateral
midbrain unchanged.

Ventricle size normal.  Negative for mass lesion.
IMPRESSION: Repeat imaging does not reveal restricted diffusion. No acute
infarct.

Periventricular white matter hyperintensities unchanged most likely
due to multiple sclerosis.

## 2020-04-13 IMAGING — MR MR CERVICAL SPINE WO/W CM
4 of 8 series · 19 of 48 positions shown · IV contrast (gadavist)
Comparison: None.

CLINICAL DATA: Paresthesia.  Numbness and tingling on right.

EXAM:
MRI CERVICAL SPINE WITHOUT AND WITH CONTRAST
TECHNIQUE: Multiplanar and multiecho pulse sequences of the cervical spine, to
include the craniocervical junction and cervicothoracic junction,
were obtained without and with intravenous contrast.
CONTRAST:  9mL GADAVIST GADOBUTROL 1 MMOL/ML IV SOLN

[Series 6: T2 · sagittal · 3.0mm · 0.43mm/px · 4 of 14 slices shown (1 of 2)]
[im 1/14]
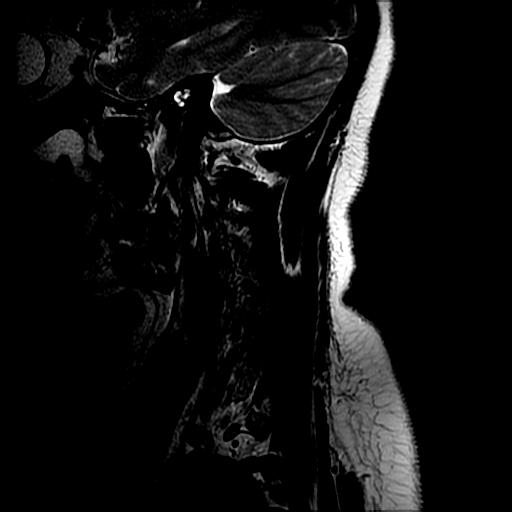
[im 5/14]
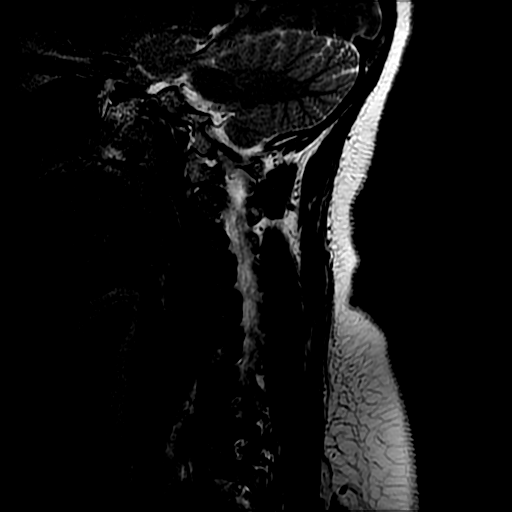
[im 9/14]
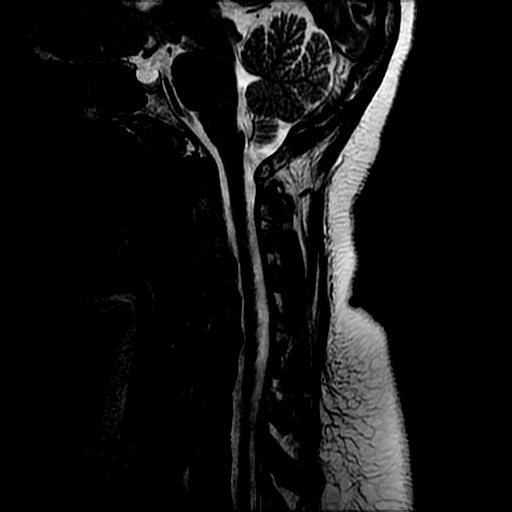
[im 14/14]
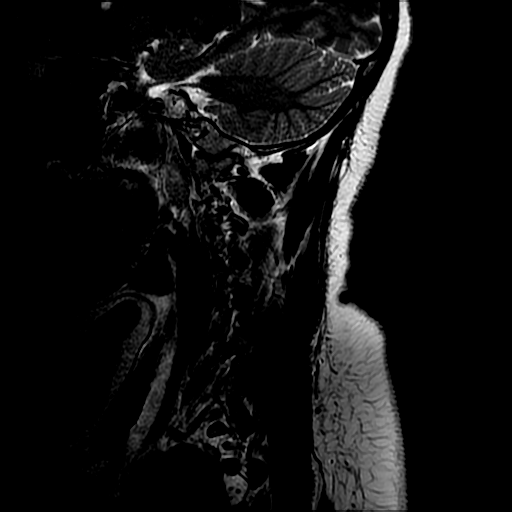

[Series 8: T1 · sagittal · 3.0mm · 0.43mm/px · 4 of 14 slices shown (1 of 2)]
[im 1/14]
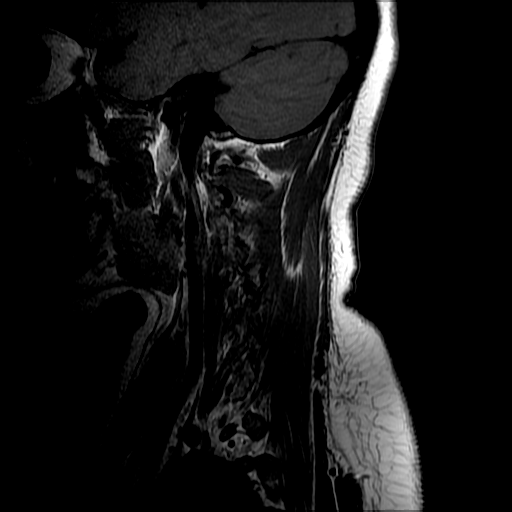
[im 5/14]
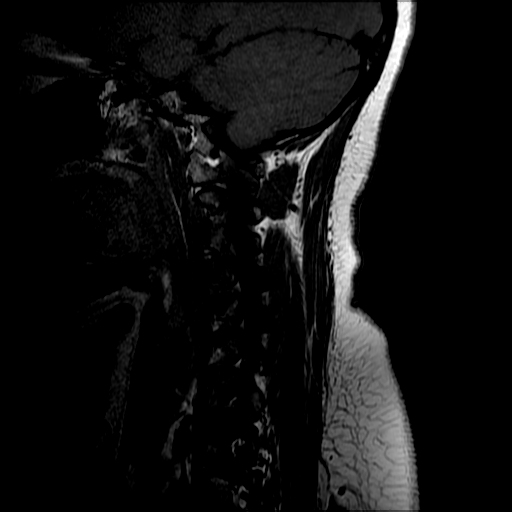
[im 9/14]
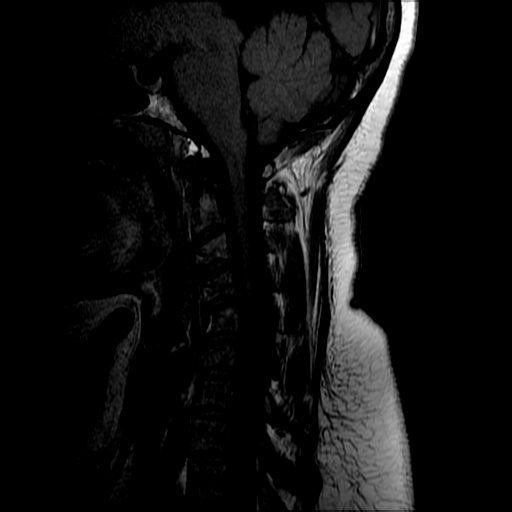
[im 14/14]
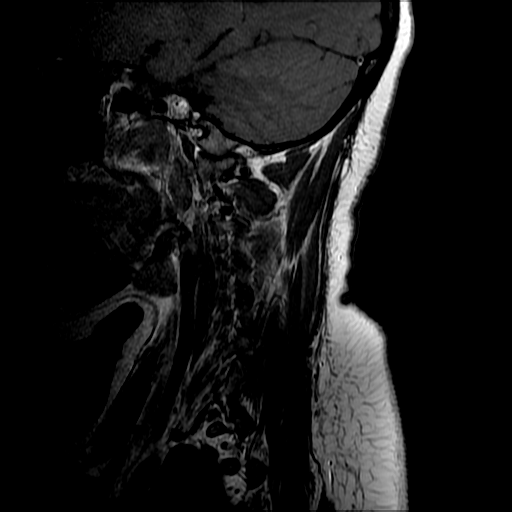

[Series 10: T2 · axial · 3.0mm · 0.39mm/px · z∈[-191,-104]mm · 8 of 28 slices shown (2 of 2)]
[im 1/28]
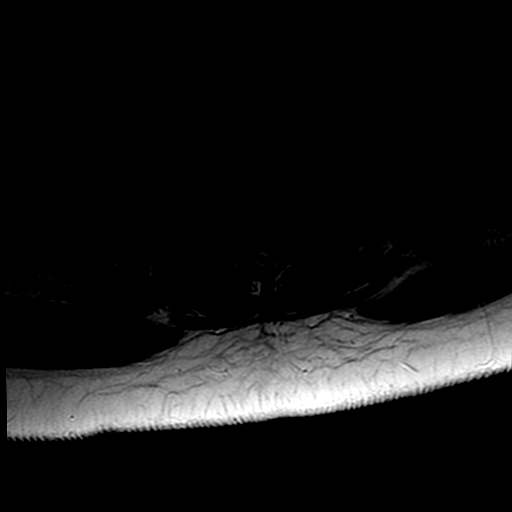
[im 4/28]
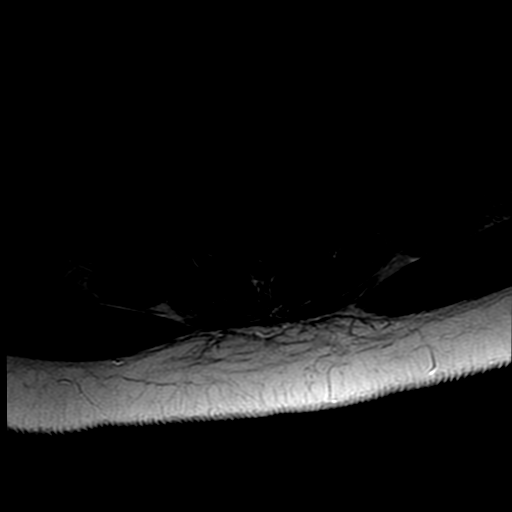
[im 8/28]
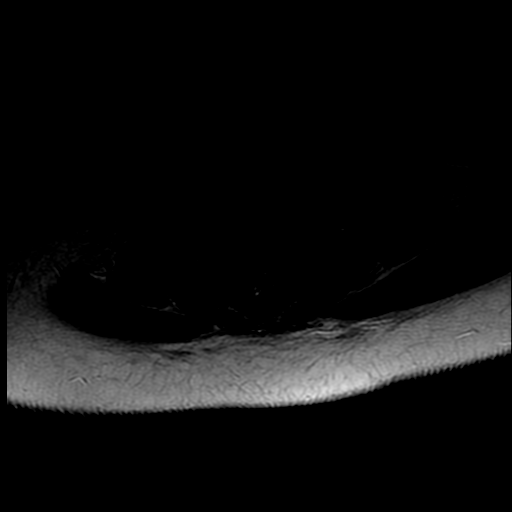
[im 12/28]
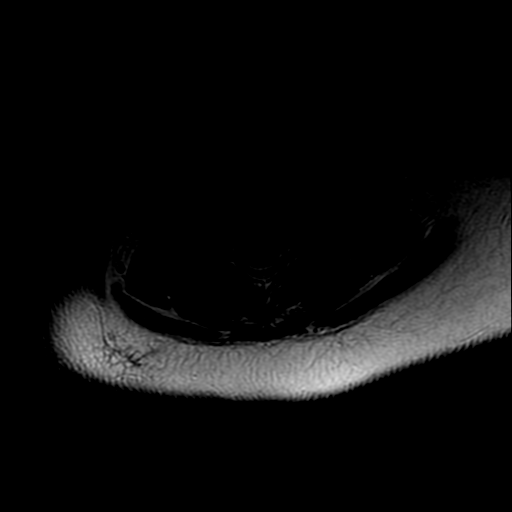
[im 16/28]
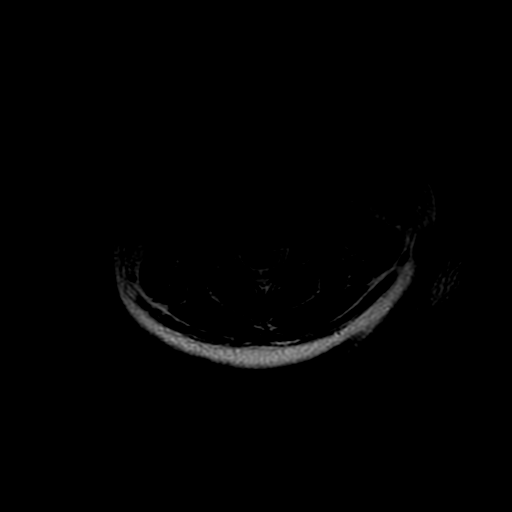
[im 20/28]
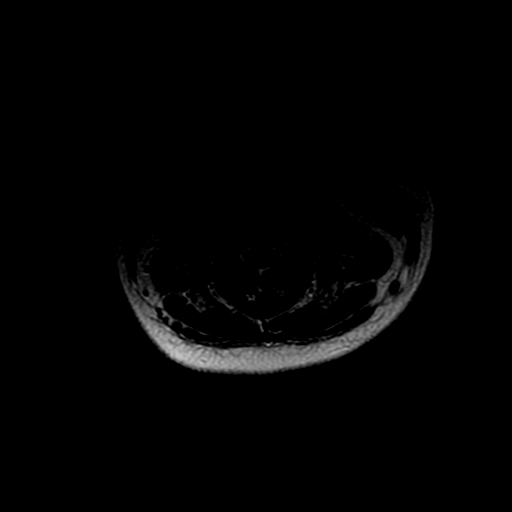
[im 24/28]
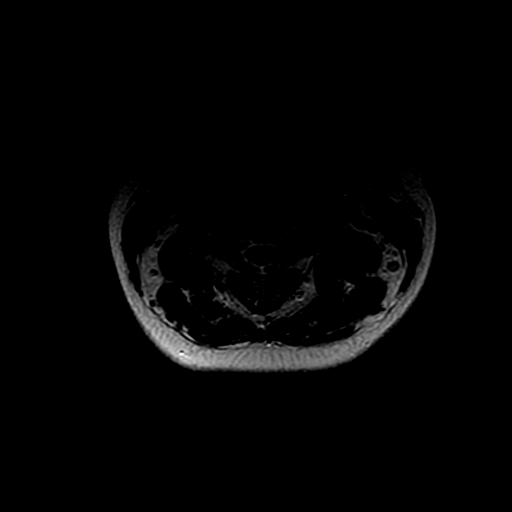
[im 28/28]
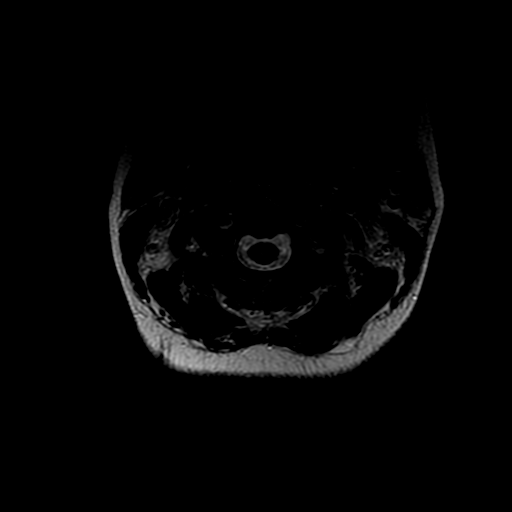

[Series 11: T1 · axial · non-contrast · 3.0mm · 0.39mm/px · z∈[-181,-117]mm · 3 of 28 slices shown (2 of 2)]
[im 4/28]
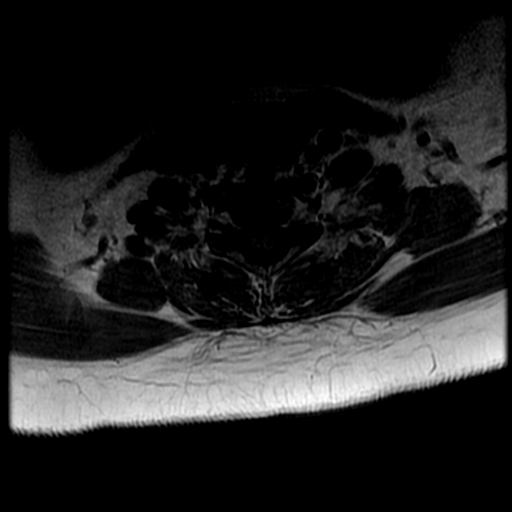
[im 16/28]
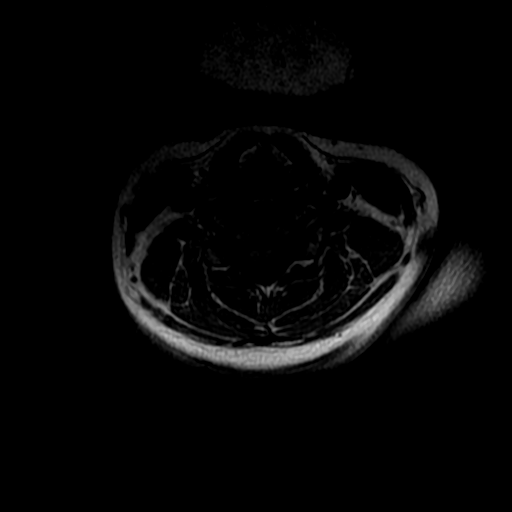
[im 24/28]
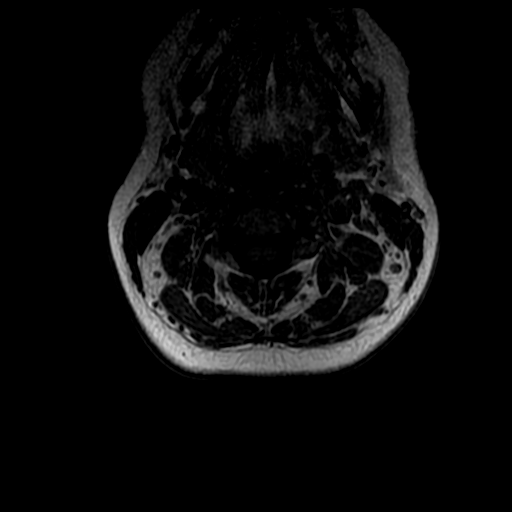

[19 of 48 positions shown; findings below may reference images not displayed]

FINDINGS: Alignment: Normal alignment.  Cervical kyphosis.

Vertebrae: Negative for fracture or mass.  Normal bone marrow.

Cord: Ill-defined hyperintensity in the central cord at the C2-3
level. This does not enhance.

Ill-defined hyperintensity in the central cord at the C5-6 level
which does not enhance.

No other cord lesion.  Image quality degraded by motion.

Posterior Fossa, vertebral arteries, paraspinal tissues: Negative

Disc levels:

No significant spinal stenosis. No disc protrusion or significant
degenerative change.
IMPRESSION: Nonenhancing cord lesions at C2-3 and C5-6. Findings compatible with
demyelinating disease from multiple sclerosis.

## 2020-04-13 MED ORDER — SODIUM CHLORIDE 0.9 % IV SOLN
1000.0000 mg | Freq: Every day | INTRAVENOUS | Status: AC
  Administered 2020-04-13 – 2020-04-15 (×3): 1000 mg via INTRAVENOUS
  Filled 2020-04-13 (×3): qty 8

## 2020-04-13 MED ORDER — ASPIRIN EC 325 MG PO TBEC
325.0000 mg | DELAYED_RELEASE_TABLET | Freq: Every day | ORAL | Status: DC
  Administered 2020-04-13 – 2020-04-14 (×2): 325 mg via ORAL
  Filled 2020-04-13 (×2): qty 1

## 2020-04-13 MED ORDER — GADOBUTROL 1 MMOL/ML IV SOLN
9.0000 mL | Freq: Once | INTRAVENOUS | Status: AC | PRN
  Administered 2020-04-13: 9 mL via INTRAVENOUS

## 2020-04-13 NOTE — Progress Notes (Signed)
PROGRESS NOTE    [name]  CWC:[id number] DOB: March 16, 1976 DOA: 04/11/2020 PCP: Patient, No Pcp Per    Brief Narrative:  44 y.o. female with medical history significant of uterine fibroid and otherwise healthy.  Pt presents to the ED with c/o 2-3 day h/o brief periods of numbness in R arm and leg.  Symptoms intermittent, lasting 1-2 mins, no identifiable trigger.  No weakness.  Has occipital headaches, 8/10 in intensity, associated with numbness. Headache improved with NSAIDs  Assessment & Plan:   Principal Problem:   Numbness and tingling of right arm and leg   1. Brain lesions on MRI with numbness, likely MS 1. Initial findings are concerning for MS with 28mm lesion seen and reviewed on MRI brain 2. Neurology consulted was initially concerned for seizure. EEG neg for seizure activity 3. Discussed with Neurology. Pt underwent f/u MRI cervical spine. Results reviewed. Findings worrisome for two areas of demyelinating disease at C2-3 and C5-6. 4. IV steroids ordered by Neurology 2. Headaches 1. Pt reported a long hx of headaches described as over frontal region with photophobia but no nausea or aura 2. Headaches have resolved with toradol, reglan, benadryl however pt remains with numbness in LE, see above  DVT prophylaxis: Lovenox subq Code Status: Full Family Communication: Pt not at bedside  Status is: Inpatient  Remains inpatient appropriate because:Ongoing diagnostic testing needed not appropriate for outpatient work up and Inpatient level of care appropriate due to severity of illness   Dispo: The patient is from: Home              Anticipated d/c is to: Home              Anticipated d/c date is: 2 days              Patient currently is not medically stable to d/c.       Consultants:   Neurology  Procedures:     Antimicrobials: Anti-infectives (From admission, onward)   None      Subjective: Headache resolved but pt still with RLE  numbness  Objective: Vitals:   04/12/20 1200 04/12/20 1800 04/13/20 0029 04/13/20 0800  BP: (!) 144/68 138/76 (!) 147/97 110/72  Pulse: 82  92 73  Resp:   18 17  Temp: 98.1 F (36.7 C) 98.3 F (36.8 C) 98.1 F (36.7 C) 97.9 F (36.6 C)  TempSrc: Oral Oral Oral   SpO2:   95% 100%  Weight:      Height:        Intake/Output Summary (Last 24 hours) at 04/13/2020 1429 Last data filed at 04/13/2020 0815 Gross per 24 hour  Intake 240 ml  Output --  Net 240 ml   Filed Weights   04/11/20 0945  Weight: 90.7 kg    Examination: General exam: Awake, laying in bed, in nad Respiratory system: Normal respiratory effort, no wheezing Cardiovascular system: regular rate, s1, s2 Gastrointestinal system: Soft, nondistended, positive BS Central nervous system: CN2-12 grossly intact, strength intact, RLE numbness Extremities: Perfused, no clubbing Skin: Normal skin turgor, no notable skin lesions seen Psychiatry: Mood normal // no visual hallucinations   Data Reviewed: I have personally reviewed following labs and imaging studies  CBC: Recent Labs  Lab 04/11/20 1521 04/12/20 0536  WBC 3.1* 3.3*  NEUTROABS 1.4*  --   HGB 11.5* 11.0*  HCT 40.1 35.8*  MCV 74.0* 70.3*  PLT 311 761   Basic Metabolic Panel: Recent Labs  Lab 04/11/20 1521  NA 139  K 3.8  CL 106  CO2 26  GLUCOSE 101*  BUN 10  CREATININE 0.64  CALCIUM 8.3*  MG 2.1   GFR: Estimated Creatinine Clearance: 107.9 mL/min (by C-G formula based on SCr of 0.64 mg/dL). Liver Function Tests: No results for input(s): AST, ALT, ALKPHOS, BILITOT, PROT, ALBUMIN in the last 168 hours. No results for input(s): LIPASE, AMYLASE in the last 168 hours. No results for input(s): AMMONIA in the last 168 hours. Coagulation Profile: No results for input(s): INR, PROTIME in the last 168 hours. Cardiac Enzymes: No results for input(s): CKTOTAL, CKMB, CKMBINDEX, TROPONINI in the last 168 hours. BNP (last 3 results) No results for  input(s): PROBNP in the last 8760 hours. HbA1C: Recent Labs    04/11/20 2048  HGBA1C 5.9*   CBG: No results for input(s): GLUCAP in the last 168 hours. Lipid Profile: Recent Labs    04/11/20 2048  CHOL 118  HDL 37*  LDLCALC 72  TRIG 43  CHOLHDL 3.2   Thyroid Function Tests: No results for input(s): TSH, T4TOTAL, FREET4, T3FREE, THYROIDAB in the last 72 hours. Anemia Panel: Recent Labs    04/12/20 0536  VITAMINB12 316   Sepsis Labs: No results for input(s): PROCALCITON, LATICACIDVEN in the last 168 hours.  Recent Results (from the past 240 hour(s))  Resp Panel by RT-PCR (Flu A&B, Covid) Nasopharyngeal Swab     Status: None   Collection Time: 04/11/20  6:51 PM   Specimen: Nasopharyngeal Swab; Nasopharyngeal(NP) swabs in vial transport medium  Result Value Ref Range Status   SARS Coronavirus 2 by RT PCR NEGATIVE NEGATIVE Final    Comment: (NOTE) SARS-CoV-2 target nucleic acids are NOT DETECTED.  The SARS-CoV-2 RNA is generally detectable in upper respiratory specimens during the acute phase of infection. The lowest concentration of SARS-CoV-2 viral copies this assay can detect is 138 copies/mL. A negative result does not preclude SARS-Cov-2 infection and should not be used as the sole basis for treatment or other patient management decisions. A negative result may occur with  improper specimen collection/handling, submission of specimen other than nasopharyngeal swab, presence of viral mutation(s) within the areas targeted by this assay, and inadequate number of viral copies(<138 copies/mL). A negative result must be combined with clinical observations, patient history, and epidemiological information. The expected result is Negative.  Fact Sheet for Patients:  EntrepreneurPulse.com.au  Fact Sheet for Healthcare Providers:  IncredibleEmployment.be  This test is no t yet approved or cleared by the [place] FDA and  has  been authorized for detection and/or diagnosis of SARS-CoV-2 by FDA under an Emergency Use Authorization (EUA). This EUA will remain  in effect (meaning this test can be used) for the duration of the COVID-19 declaration under Section 564(b)(1) of the Act, 21 U.S.C.section 360bbb-3(b)(1), unless the authorization is terminated  or revoked sooner.       Influenza A by PCR NEGATIVE NEGATIVE Final   Influenza B by PCR NEGATIVE NEGATIVE Final    Comment: (NOTE) The Xpert Xpress SARS-CoV-2/FLU/RSV plus assay is intended as an aid in the diagnosis of influenza from Nasopharyngeal swab specimens and should not be used as a sole basis for treatment. Nasal washings and aspirates are unacceptable for Xpert Xpress SARS-CoV-2/FLU/RSV testing.  Fact Sheet for Patients: EntrepreneurPulse.com.au  Fact Sheet for Healthcare Providers: IncredibleEmployment.be  This test is not yet approved or cleared by the [place] FDA and has been authorized for detection and/or diagnosis of SARS-CoV-2 by FDA under an Emergency Use  Authorization (EUA). This EUA will remain in effect (meaning this test can be used) for the duration of the COVID-19 declaration under Section 564(b)(1) of the Act, 21 U.S.C. section 360bbb-3(b)(1), unless the authorization is terminated or revoked.  Performed at [facility], [place] [address] [place], [place] [address]      Radiology Studies: MR BRAIN WO CONTRAST  Result Date: 04/13/2020 CLINICAL DATA:  Paresthesia. Numbness and tingling right arm and leg. EXAM: MRI HEAD WITHOUT CONTRAST TECHNIQUE: Multiplanar, multiecho pulse sequences of the brain and surrounding structures were obtained without intravenous contrast. COMPARISON:  MRI brain 04/11/2020 FINDINGS: Brain: Limited protocol per Dr. Leonel Ramsay. Diffusion-weighted imaging and FLAIR imaging repeated. Diffusion hyperintensity right parietal white matter on the  prior study no longer identified. No areas of restricted diffusion. Mild periventricular white matter hyperintensities bilaterally similar to the prior study. Hyperintensity in the right lateral midbrain unchanged. Ventricle size normal.  Negative for mass lesion. IMPRESSION: Repeat imaging does not reveal restricted diffusion. No acute infarct. Periventricular white matter hyperintensities unchanged most likely due to multiple sclerosis. Electronically Signed   By: Franchot Gallo M.D.   On: 04/13/2020 13:42   MR Brain W and Wo Contrast  Result Date: 04/11/2020 CLINICAL DATA:  Multiple sclerosis, new event. Additional provided: Patient reports 2-3 days of brief periods of numbness in right arm and leg. EXAM: MRI HEAD WITHOUT AND WITH CONTRAST TECHNIQUE: Multiplanar, multiecho pulse sequences of the brain and surrounding structures were obtained without and with intravenous contrast. CONTRAST:  68mL GADAVIST GADOBUTROL 1 MMOL/ML IV SOLN COMPARISON:  No pertinent prior exams available for comparison. FINDINGS: Brain: Cerebral volume is normal for age. Ill-defined and scattered T2/FLAIR hyperintensity within the cerebral white matter, pons and right midbrain, nonspecific but possibly reflecting sequela of demyelinating disease given the provided history of multiple sclerosis. A 5 mm lesion within the right parietal white matter demonstrates subtle diffusion weighted hyperintensity and probable mild enhancement (series 3, image 28) (series 13, image 8). No enhancement identified elsewhere within the brain. 8 mm dural-based homogeneously enhancing mass overlying the posterior right frontal lobe likely reflecting a small incidental meningioma (series 13, image 13) (series 3, image 47). This mass abuts the superior sagittal sinus. No chronic intracranial blood products. No extra-axial fluid collection. No midline shift. Partially empty sella turcica. Vascular: Expected proximal arterial flow voids. Skull and upper  cervical spine: No focal marrow lesion. Sinuses/Orbits: Visualized orbits show no acute finding. Trace ethmoid sinus mucosal thickening. Moderate-sized right maxillary sinus mucous retention cyst. These results will be called to the ordering clinician or representative by the Radiologist Assistant, and communication documented in the PACS or [employer]. IMPRESSION: Ill-defined and scattered T2/FLAIR hyperintensity within the cerebral white matter, pons and right midbrain is nonspecific, but may reflect sequela of demyelinating disease given the provided history multiple sclerosis. A 5 mm lesion within the right parietal white matter demonstrates subtle diffusion weighted hyperintensity and probable mild enhancement. This may reflect a focus of active demyelination. A subacute infarct also a differential consideration. Incidentally noted 8 mm dural-based mass overlying the posterior right frontal lobe, likely a meningioma. Moderate-sized right maxillary sinus mucous retention cyst Electronically Signed   By: Kellie Simmering DO   On: 04/11/2020 18:34   MR CERVICAL SPINE W WO CONTRAST  Result Date: 04/13/2020 CLINICAL DATA:  Paresthesia.  Numbness and tingling on right. EXAM: MRI CERVICAL SPINE WITHOUT AND WITH CONTRAST TECHNIQUE: Multiplanar and multiecho pulse sequences of the cervical spine, to include the craniocervical junction  and cervicothoracic junction, were obtained without and with intravenous contrast. CONTRAST:  64mL GADAVIST GADOBUTROL 1 MMOL/ML IV SOLN COMPARISON:  None. FINDINGS: Alignment: Normal alignment.  Cervical kyphosis. Vertebrae: Negative for fracture or mass.  Normal bone marrow. Cord: Ill-defined hyperintensity in the central cord at the C2-3 level. This does not enhance. Ill-defined hyperintensity in the central cord at the C5-6 level which does not enhance. No other cord lesion.  Image quality degraded by motion. Posterior Fossa, vertebral arteries, paraspinal tissues: Negative Disc  levels: No significant spinal stenosis. No disc protrusion or significant degenerative change. IMPRESSION: Nonenhancing cord lesions at C2-3 and C5-6. Findings compatible with demyelinating disease from multiple sclerosis. Electronically Signed   By: Franchot Gallo M.D.   On: 04/13/2020 13:44   EEG adult  Result Date: 04/12/2020 Lora Havens, MD     04/12/2020  4:04 PM Patient Name: MALINI FLEMINGS MRN: [id number] Epilepsy Attending: Lora Havens Referring Physician/Provider: Dr Lesleigh Noe Date: 04/12/2020 Duration: 31.02 mins Patient history: 44 y.o. right-handed woman with no significant past medical history presenting with episodes of right arm and leg sensory symptoms. These are intermittent in nature but have been occurring more frequently in the past few days concerning for focal seizure. EEG to evaluate for seizure. Level of alertness: Awake,asleep AEDs during EEG study: None Technical aspects: This EEG study was done with scalp electrodes positioned according to the 10-20 International system of electrode placement. Electrical activity was acquired at a sampling rate of 500Hz  and reviewed with a high frequency filter of 70Hz  and a low frequency filter of 1Hz . EEG data were recorded continuously and digitally stored. Description: The posterior dominant rhythm consists of 111 Hz activity of moderate voltage (25-35 uV) seen predominantly in posterior head regions, symmetric and reactive to eye opening and eye closing. Sleep was characterized by vertex waves, sleep spindles (12 to 14 Hz), maximal frontocentral region.  Hyperventilation did not show any EEG change.  Physiologic photic driving was not seen during photic stimulation.   IMPRESSION: This study is within normal limits. No seizures or epileptiform discharges were seen throughout the recording. Priyanka Barbra Sarks   EEG LTVM - Continuous Bedside W/ Video Includes Portable EEG Read  Result Date: 04/13/2020 Lora Havens, MD      04/13/2020  1:26 PM Patient Name: JEANNIE MALLINGER MRN: [id number] Epilepsy Attending: Lora Havens Referring Physician/Provider: Dr Lesleigh Noe Duration: 04/12/2020 1610 to 04/13/2020 1132  Patient history: 44 y.o. right-handedwoman with no significant past medical history presenting with episodes of right arm and leg sensory symptoms. These are intermittent in nature but have been occurring more frequently in the past few days concerning for focal seizure. EEG to evaluate for seizure.  Level of alertness: Awake,asleep  AEDs during EEG study: None  Technical aspects: This EEG study was done with scalp electrodes positioned according to the 10-20 International system of electrode placement. Electrical activity was acquired at a sampling rate of 500Hz  and reviewed with a high frequency filter of 70Hz  and a low frequency filter of 1Hz . EEG data were recorded continuously and digitally stored.  Description: The posterior dominant rhythm consists of 111 Hz activity of moderate voltage (25-35 uV) seen predominantly in posterior head regions, symmetric and reactive to eye opening and eye closing. Sleep was characterized by vertex waves, sleep spindles (12 to 14 Hz), maximal frontocentral region.  One event was recorded on 04/12/2020 at 2010 during which patient reported right arm paresthesias. Concomitant eeg before, during and after  the event didn't show any eeg change to suggest seizure.  IMPRESSION: This study is within normal limits. No seizures or epileptiform discharges were seen throughout the recording. One event was recorded on 04/12/2020 at 2010 during which patient reported right arm paresthesias without concomitant eeg change. However, focal sensory seizures may not be always seen on scalp eeg so clinical correlation is recommended.  Priyanka Barbra Sarks    Scheduled Meds: . aspirin EC  325 mg Oral Daily  . enoxaparin (LOVENOX) injection  40 mg Subcutaneous Q24H   Continuous Infusions: .  methylPREDNISolone (SOLU-MEDROL) injection       LOS: 2 days   Marylu Lund, MD [facility] Pager On Amion  If 7PM-7AM, please contact night-coverage 04/13/2020, 2:29 PM

## 2020-04-13 NOTE — Procedures (Addendum)
Patient Name: [name]  MRN: [id number]  Epilepsy Attending: Lora Havens  Referring Physician/Provider: Dr Lesleigh Noe Duration: 04/12/2020 1610 to 04/13/2020 1132  Patient history: 44 y.o. right-handedwoman with no significant past medical history presenting with episodes of right arm and leg sensory symptoms. These are intermittent in nature but have been occurring more frequently in the past few days concerning for focal seizure. EEG to evaluate for seizure.   Level of alertness: Awake,asleep  AEDs during EEG study: None  Technical aspects: This EEG study was done with scalp electrodes positioned according to the 10-20 International system of electrode placement. Electrical activity was acquired at a sampling rate of 500Hz  and reviewed with a high frequency filter of 70Hz  and a low frequency filter of 1Hz . EEG data were recorded continuously and digitally stored.   Description: The posterior dominant rhythm consists of 111 Hz activity of moderate voltage (25-35 uV) seen predominantly in posterior head regions, symmetric and reactive to eye opening and eye closing. Sleep was characterized by vertex waves, sleep spindles (12 to 14 Hz), maximal frontocentral region.    One event was recorded on 04/12/2020 at 2010 during which patient reported right arm paresthesias. Concomitant eeg before, during and after the event didn't show any eeg change to suggest seizure.  IMPRESSION: This study is within normal limits. No seizures or epileptiform discharges were seen throughout the recording.  One event was recorded on 04/12/2020 at 2010 during which patient reported right arm paresthesias without concomitant eeg change. However, focal sensory seizures may not be always seen on scalp eeg so clinical correlation is recommended.  Unique Sillas Barbra Sarks

## 2020-04-13 NOTE — Progress Notes (Addendum)
Pt c/o increased numbness and tingling to bilateral lower extremities. Per pt, previously this was present in lower part of her right leg and it resolves after few minutes. This time numbness and tingling is constantly present. It spreads up to thigh in her right leg and it is new to her left foot. Pt also reported heaviness of her right leg. On assessment decreased sensation to right leg, pt reported touch as more dull in her right leg. Pulses +2 and equal bilaterally, skin dry and warm, no edema, pt able to move both lower extremities. MD on call aware. Will continue to monitor pt.

## 2020-04-13 NOTE — Progress Notes (Signed)
vLTM EEG complete. No skin breakdown 

## 2020-04-13 NOTE — Progress Notes (Signed)
Subjective: Multiple episodes of right sided numbness, now with persistent right foot numbness.   Exam: Vitals:   04/13/20 0029 04/13/20 0800  BP: (!) 147/97 110/72  Pulse: 92 73  Resp: 18 17  Temp: 98.1 F (36.7 C) 97.9 F (36.6 C)  SpO2: 95% 100%   Gen: In bed, NAD Resp: non-labored breathing, no acute distress Abd: soft, nt  Neuro: MS: Awake, alert, interactive and appropriate. CN: EOMI, face symmetric Motor: 5/5 in bilateral arm flexion, elbow flexion, extension, hip flexion, ankle dorsi/plantarflexion.  Sensory:intact to LT in the arms, decreased in the right foot to the ankle   Pertinent Labs: LDL 72 A1C 5.9 Mg 2.1  Impression: 44 year old female with recurrent right-sided paresthesias of unclear etiology.  EEG without evidence of epileptiform activity, so no evidence that this is seizure related.  Stuttering infarct/capsular warning syndrome is a possibility, though obesity is her only risk factor.  With her nonspecific T2 changes, there is concern for microvascular disease and I would favor starting her on aspirin.  Her LDL is already so close to 70 that I would not favor starting aggressive lipid management at this time.  Another possibility would be cervical spine disease, and I would favor looking at her C-spine.  I will do a contrasted study given the concern for possible enhancement associated with the cerebral lesion.  The cerebral lesion is incidental and not related to the current presentation, but could represent either microvascular disease or demyelinating disease, or could even be related to migraine.    Also, now that she has persistent symptoms, I do think that repeating the diffusion imaging of her brain may be helpful, if this is again negative for a symptomatic lesion, then I think ischemia is a less likely culprit.  Finally, with migrating paresthesias as well as report of a headache yesterday, I think that complicated migraine is actually the most likely  culprit.  Recommendations: 1) start aspirin 81 mg daily 2) MRI cervical spine  Roland Rack, MD [facility] [phone]  If 7pm- 7am, please page neurology on call as listed in [place].

## 2020-04-13 NOTE — Plan of Care (Signed)

## 2020-04-14 LAB — COPPER, SERUM: Copper: 149 ug/dL (ref 80–158)

## 2020-04-14 LAB — ZINC: Zinc: 86 ug/dL (ref 44–115)

## 2020-04-14 NOTE — Progress Notes (Signed)
LTM EEG discontinued yesterday by EEG techs Jennifer and Amy- no skin breakdown at [facility]

## 2020-04-14 NOTE — Progress Notes (Signed)
PROGRESS NOTE    [name]  GUR:[id number] DOB: Sep 13, 1975 DOA: 04/11/2020 PCP: Patient, No Pcp Per    Brief Narrative:  44 y.o. female with medical history significant of uterine fibroid and otherwise healthy.  Pt presents to the ED with c/o 2-3 day h/o brief periods of numbness in R arm and leg.  Symptoms intermittent, lasting 1-2 mins, no identifiable trigger.  No weakness.  Has occipital headaches, 8/10 in intensity, associated with numbness. Headache improved with NSAIDs  Assessment & Plan:   Principal Problem:   Numbness and tingling of right arm and leg  1. Brain lesions on MRI with numbness, likely MS 1. Initial findings are concerning for MS with 64mm lesion seen and reviewed on MRI brain 2. Neurology consulted was initially concerned for seizure. EEG neg for seizure activity 3. Discussed with Neurology. Pt underwent f/u MRI cervical spine. Results reviewed. Findings worrisome for two areas of demyelinating disease at C2-3 and C5-6. 4. IV steroids ordered by Neurology, started on 12/6. Plan for 3 days total 2. Headaches 1. Pt reported a long hx of headaches described as over frontal region with photophobia but no nausea or aura 2. Headaches have resolved with toradol, reglan, benadryl  DVT prophylaxis: Lovenox subq Code Status: Full Family Communication: Pt not at bedside  Status is: Inpatient  Remains inpatient appropriate because:Ongoing diagnostic testing needed not appropriate for outpatient work up and Inpatient level of care appropriate due to severity of illness   Dispo: The patient is from: Home              Anticipated d/c is to: Home              Anticipated d/c date is: 2 days              Patient currently is not medically stable to d/c.       Consultants:   Neurology  Procedures:     Antimicrobials: Anti-infectives (From admission, onward)   None      Subjective: Continues with LE numbness  Objective: Vitals:   04/13/20 1626  04/13/20 2236 04/13/20 2300 04/14/20 0500  BP: 136/73  (!) 156/75 (!) 150/91  Pulse: 90  73 73  Resp: 15 20 20 19   Temp: 98.1 F (36.7 C)  98.5 F (36.9 C) 98.1 F (36.7 C)  TempSrc:   Oral Oral  SpO2: 100%  99% 94%  Weight:      Height:        Intake/Output Summary (Last 24 hours) at 04/14/2020 1509 Last data filed at 04/14/2020 0300 Gross per 24 hour  Intake 58 ml  Output --  Net 58 ml   Filed Weights   04/11/20 0945  Weight: 90.7 kg    Examination: General exam: Conversant, in no acute distress Respiratory system: normal chest rise, clear, no audible wheezing Cardiovascular system: regular rhythm, s1-s2 Gastrointestinal system: Nondistended, nontender, pos BS Central nervous system: No seizures, no tremors Extremities: No cyanosis, no joint deformities Skin: No rashes, no pallor Psychiatry: Affect normal // no auditory hallucinations   Data Reviewed: I have personally reviewed following labs and imaging studies  CBC: Recent Labs  Lab 04/11/20 1521 04/12/20 0536  WBC 3.1* 3.3*  NEUTROABS 1.4*  --   HGB 11.5* 11.0*  HCT 40.1 35.8*  MCV 74.0* 70.3*  PLT 311 283   Basic Metabolic Panel: Recent Labs  Lab 04/11/20 1521  NA 139  K 3.8  CL 106  CO2 26  GLUCOSE 101*  BUN 10  CREATININE 0.64  CALCIUM 8.3*  MG 2.1   GFR: Estimated Creatinine Clearance: 107.9 mL/min (by C-G formula based on SCr of 0.64 mg/dL). Liver Function Tests: No results for input(s): AST, ALT, ALKPHOS, BILITOT, PROT, ALBUMIN in the last 168 hours. No results for input(s): LIPASE, AMYLASE in the last 168 hours. No results for input(s): AMMONIA in the last 168 hours. Coagulation Profile: No results for input(s): INR, PROTIME in the last 168 hours. Cardiac Enzymes: No results for input(s): CKTOTAL, CKMB, CKMBINDEX, TROPONINI in the last 168 hours. BNP (last 3 results) No results for input(s): PROBNP in the last 8760 hours. HbA1C: Recent Labs    04/11/20 2048  HGBA1C 5.9*    CBG: No results for input(s): GLUCAP in the last 168 hours. Lipid Profile: Recent Labs    04/11/20 2048  CHOL 118  HDL 37*  LDLCALC 72  TRIG 43  CHOLHDL 3.2   Thyroid Function Tests: No results for input(s): TSH, T4TOTAL, FREET4, T3FREE, THYROIDAB in the last 72 hours. Anemia Panel: Recent Labs    04/12/20 0536  VITAMINB12 316   Sepsis Labs: No results for input(s): PROCALCITON, LATICACIDVEN in the last 168 hours.  Recent Results (from the past 240 hour(s))  Resp Panel by RT-PCR (Flu A&B, Covid) Nasopharyngeal Swab     Status: None   Collection Time: 04/11/20  6:51 PM   Specimen: Nasopharyngeal Swab; Nasopharyngeal(NP) swabs in vial transport medium  Result Value Ref Range Status   SARS Coronavirus 2 by RT PCR NEGATIVE NEGATIVE Final    Comment: (NOTE) SARS-CoV-2 target nucleic acids are NOT DETECTED.  The SARS-CoV-2 RNA is generally detectable in upper respiratory specimens during the acute phase of infection. The lowest concentration of SARS-CoV-2 viral copies this assay can detect is 138 copies/mL. A negative result does not preclude SARS-Cov-2 infection and should not be used as the sole basis for treatment or other patient management decisions. A negative result may occur with  improper specimen collection/handling, submission of specimen other than nasopharyngeal swab, presence of viral mutation(s) within the areas targeted by this assay, and inadequate number of viral copies(<138 copies/mL). A negative result must be combined with clinical observations, patient history, and epidemiological information. The expected result is Negative.  Fact Sheet for Patients:  EntrepreneurPulse.com.au  Fact Sheet for Healthcare Providers:  IncredibleEmployment.be  This test is no t yet approved or cleared by the [place] FDA and  has been authorized for detection and/or diagnosis of SARS-CoV-2 by FDA under an Emergency Use  Authorization (EUA). This EUA will remain  in effect (meaning this test can be used) for the duration of the COVID-19 declaration under Section 564(b)(1) of the Act, 21 U.S.C.section 360bbb-3(b)(1), unless the authorization is terminated  or revoked sooner.       Influenza A by PCR NEGATIVE NEGATIVE Final   Influenza B by PCR NEGATIVE NEGATIVE Final    Comment: (NOTE) The Xpert Xpress SARS-CoV-2/FLU/RSV plus assay is intended as an aid in the diagnosis of influenza from Nasopharyngeal swab specimens and should not be used as a sole basis for treatment. Nasal washings and aspirates are unacceptable for Xpert Xpress SARS-CoV-2/FLU/RSV testing.  Fact Sheet for Patients: EntrepreneurPulse.com.au  Fact Sheet for Healthcare Providers: IncredibleEmployment.be  This test is not yet approved or cleared by the [place] FDA and has been authorized for detection and/or diagnosis of SARS-CoV-2 by FDA under an Emergency Use Authorization (EUA). This EUA will remain in effect (meaning this test can be used) for  the duration of the COVID-19 declaration under Section 564(b)(1) of the Act, 21 U.S.C. section 360bbb-3(b)(1), unless the authorization is terminated or revoked.  Performed at [facility], [place] [address] [place], [place] [address]      Radiology Studies: MR BRAIN WO CONTRAST  Result Date: 04/13/2020 CLINICAL DATA:  Paresthesia. Numbness and tingling right arm and leg. EXAM: MRI HEAD WITHOUT CONTRAST TECHNIQUE: Multiplanar, multiecho pulse sequences of the brain and surrounding structures were obtained without intravenous contrast. COMPARISON:  MRI brain 04/11/2020 FINDINGS: Brain: Limited protocol per Dr. Leonel Ramsay. Diffusion-weighted imaging and FLAIR imaging repeated. Diffusion hyperintensity right parietal white matter on the prior study no longer identified. No areas of restricted diffusion. Mild periventricular white  matter hyperintensities bilaterally similar to the prior study. Hyperintensity in the right lateral midbrain unchanged. Ventricle size normal.  Negative for mass lesion. IMPRESSION: Repeat imaging does not reveal restricted diffusion. No acute infarct. Periventricular white matter hyperintensities unchanged most likely due to multiple sclerosis. Electronically Signed   By: Franchot Gallo M.D.   On: 04/13/2020 13:42   MR CERVICAL SPINE W WO CONTRAST  Result Date: 04/13/2020 CLINICAL DATA:  Paresthesia.  Numbness and tingling on right. EXAM: MRI CERVICAL SPINE WITHOUT AND WITH CONTRAST TECHNIQUE: Multiplanar and multiecho pulse sequences of the cervical spine, to include the craniocervical junction and cervicothoracic junction, were obtained without and with intravenous contrast. CONTRAST:  65mL GADAVIST GADOBUTROL 1 MMOL/ML IV SOLN COMPARISON:  None. FINDINGS: Alignment: Normal alignment.  Cervical kyphosis. Vertebrae: Negative for fracture or mass.  Normal bone marrow. Cord: Ill-defined hyperintensity in the central cord at the C2-3 level. This does not enhance. Ill-defined hyperintensity in the central cord at the C5-6 level which does not enhance. No other cord lesion.  Image quality degraded by motion. Posterior Fossa, vertebral arteries, paraspinal tissues: Negative Disc levels: No significant spinal stenosis. No disc protrusion or significant degenerative change. IMPRESSION: Nonenhancing cord lesions at C2-3 and C5-6. Findings compatible with demyelinating disease from multiple sclerosis. Electronically Signed   By: Franchot Gallo M.D.   On: 04/13/2020 13:44   EEG adult  Result Date: 04/12/2020 Lora Havens, MD     04/12/2020  4:04 PM Patient Name: [name] MRN: [id number] Epilepsy Attending: Lora Havens Referring Physician/Provider: Dr Lesleigh Noe Date: 04/12/2020 Duration: 31.02 mins Patient history: 44 y.o. right-handed woman with no significant past medical history presenting with  episodes of right arm and leg sensory symptoms. These are intermittent in nature but have been occurring more frequently in the past few days concerning for focal seizure. EEG to evaluate for seizure. Level of alertness: Awake,asleep AEDs during EEG study: None Technical aspects: This EEG study was done with scalp electrodes positioned according to the 10-20 International system of electrode placement. Electrical activity was acquired at a sampling rate of 500Hz  and reviewed with a high frequency filter of 70Hz  and a low frequency filter of 1Hz . EEG data were recorded continuously and digitally stored. Description: The posterior dominant rhythm consists of 111 Hz activity of moderate voltage (25-35 uV) seen predominantly in posterior head regions, symmetric and reactive to eye opening and eye closing. Sleep was characterized by vertex waves, sleep spindles (12 to 14 Hz), maximal frontocentral region.  Hyperventilation did not show any EEG change.  Physiologic photic driving was not seen during photic stimulation.   IMPRESSION: This study is within normal limits. No seizures or epileptiform discharges were seen throughout the recording. Priyanka O Yadav   EEG LTVM - Continuous  Bedside W/ Video Includes Portable EEG Read  Result Date: 04/13/2020 Lora Havens, MD     04/13/2020  1:26 PM Patient Name: [name] MRN: [id number] Epilepsy Attending: Lora Havens Referring Physician/Provider: Dr Lesleigh Noe Duration: 04/12/2020 1610 to 04/13/2020 1132  Patient history: 45 y.o. right-handedwoman with no significant past medical history presenting with episodes of right arm and leg sensory symptoms. These are intermittent in nature but have been occurring more frequently in the past few days concerning for focal seizure. EEG to evaluate for seizure.  Level of alertness: Awake,asleep  AEDs during EEG study: None  Technical aspects: This EEG study was done with scalp electrodes positioned according to the  10-20 International system of electrode placement. Electrical activity was acquired at a sampling rate of 500Hz  and reviewed with a high frequency filter of 70Hz  and a low frequency filter of 1Hz . EEG data were recorded continuously and digitally stored.  Description: The posterior dominant rhythm consists of 111 Hz activity of moderate voltage (25-35 uV) seen predominantly in posterior head regions, symmetric and reactive to eye opening and eye closing. Sleep was characterized by vertex waves, sleep spindles (12 to 14 Hz), maximal frontocentral region.  One event was recorded on 04/12/2020 at 2010 during which patient reported right arm paresthesias. Concomitant eeg before, during and after the event didn't show any eeg change to suggest seizure.  IMPRESSION: This study is within normal limits. No seizures or epileptiform discharges were seen throughout the recording. One event was recorded on 04/12/2020 at 2010 during which patient reported right arm paresthesias without concomitant eeg change. However, focal sensory seizures may not be always seen on scalp eeg so clinical correlation is recommended.  Priyanka Barbra Sarks    Scheduled Meds: . enoxaparin (LOVENOX) injection  40 mg Subcutaneous Q24H   Continuous Infusions: . methylPREDNISolone (SOLU-MEDROL) injection 1,000 mg (04/14/20 1140)     LOS: 3 days   Marylu Lund, MD [facility] Pager On Amion  If 7PM-7AM, please contact night-coverage 04/14/2020, 3:09 PM

## 2020-04-14 NOTE — Progress Notes (Signed)
Subjective: Continues to have some right foot numbness Exam: Vitals:   04/13/20 2300 04/14/20 0500  BP: (!) 156/75 (!) 150/91  Pulse: 73 73  Resp: 20 19  Temp: 98.5 F (36.9 C) 98.1 F (36.7 C)  SpO2: 99% 94%   Gen: In bed, NAD Resp: non-labored breathing, no acute distress Abd: soft, nt  Neuro: MS: Awake, alert, interactive and appropriate. CN: EOMI, face symmetric Motor: 5/5 in bilateral arm flexion, elbow flexion, extension, hip flexion, ankle dorsi/plantarflexion.  Sensory:intact to LT in the arms, decreased in the right foot to the ankle   Pertinent Labs: LDL 72 A1C 5.9 Mg 2.1  Impression: 44 year old female with MRI concerning for multiple sclerosis.  The lack of diffusion positivity on MRI coupled with the presence of multiple T2 lesions including spinal lesions, and a single enhancing lesion as previously seen on the last MRI brain are most consistent with multiple sclerosis.  At this point, I would favor doing IV Solu-Medrol for 3 days and she will need follow-up as an outpatient for consideration of disease modifying therapy.  Recommendations: 1) discontinue aspirin 2) check ANA, SSA, SSB 3) Solu-Medrol 1 g daily for 3 days  Roland Rack, MD [facility] [phone]  If 7pm- 7am, please page neurology on call as listed in [place].

## 2020-04-15 LAB — SJOGRENS SYNDROME-B EXTRACTABLE NUCLEAR ANTIBODY: SSB (La) (ENA) Antibody, IgG: <0.2 AI (ref 0.0–0.9)

## 2020-04-15 LAB — ANA W/REFLEX IF POSITIVE: Anti Nuclear Antibody (ANA): NEGATIVE

## 2020-04-15 LAB — SJOGRENS SYNDROME-A EXTRACTABLE NUCLEAR ANTIBODY: SSA (Ro) (ENA) Antibody, IgG: <0.2 AI (ref 0.0–0.9)

## 2020-04-15 LAB — VITAMIN D 25 HYDROXY (VIT D DEFICIENCY, FRACTURES): Vit D, 25-Hydroxy: 13.03 ng/mL — ABNORMAL LOW (ref 30–100)

## 2020-04-15 MED ORDER — VITAMIN D 25 MCG (1000 UNIT) PO TABS: 1000.0000 [IU] | ORAL_TABLET | Freq: Every day | ORAL | 1 refills | Status: DC

## 2020-04-15 MED ORDER — VITAMIN D (ERGOCALCIFEROL) 1.25 MG (50000 UNIT) PO CAPS: 50000.0000 [IU] | ORAL_CAPSULE | ORAL | 0 refills | Status: DC

## 2020-04-15 NOTE — Discharge Summary (Signed)
Physician Discharge Summary  [name] INO:[id number] DOB: [birth year] DOA: 04/11/2020  PCP: Patient, No Pcp Per  Admit date: 04/11/2020 Discharge date: 04/15/2020  Time spent: 35 minutes  Recommendations for Outpatient Follow-up:  1. Ambulatory referral to neurology 2. PCP in 1 week   Discharge Diagnoses:  Principal Problem:   Numbness and tingling of right arm and leg Multiple sclerosis Vitamin D deficiency  Discharge Condition: Stable  Diet recommendation: Regular  Filed Weights   04/11/20 0945  Weight: 90.7 kg    History of present illness:  44 y.o.femalewith medical history significant ofuterine fibroid and otherwise healthy. Pt presents to the ED with c/o 2-3 day h/o brief periods of numbness in R arm and leg. Symptoms intermittent, lasting 1-2 mins, no identifiable trigger. No weakness.  Hospital Course:  44 year old female with history of uterine fibroids presented to the ED with 3-day history of intermittent numbness in her right leg and right arm. -MRI of brain and C-spine were recommended by neurology, this noted multiple T2 lesions including spinal lesions and a single enhancing lesion as previously seen on last MRI brain most consistent with multiple sclerosis  -Neurology recommended 3-day course of IV Solu-Medrol which was completed today  -Vitamin D level was noted to be very low at 13 and supplement was started, vitamin B12 was normal -Referral for neurology completed for follow-up in 2 weeks to consider disease modifying therapy  -ANA, SSA, SSB pending at the time of discharge   Consultations: Neurology Discharge Exam: Vitals:   04/14/20 2204 04/15/20 0527  BP: (!) 151/97 (!) 150/94  Pulse: 75 66  Resp: 20 20  Temp: 97.8 F (36.6 C)   SpO2: 95% 100%    General: AAOx3 Cardiovascular: S1S2/RRR Respiratory: CTAB  Discharge Instructions   Discharge Instructions    Ambulatory referral to Neurology   Complete by: As directed    An  appointment is requested in approximately: 2 weeks   Increase activity slowly   Complete by: As directed      Allergies as of 04/15/2020      Reactions   Pork-derived Products    Religous beliefs. Ok with lovenox.      Medication List    STOP taking these medications   cephALEXin 500 MG capsule Commonly known as: KEFLEX   fluconazole 150 MG tablet Commonly known as: Diflucan   hydrOXYzine 25 MG tablet Commonly known as: ATARAX/VISTARIL     TAKE these medications   cetirizine 10 MG tablet Commonly known as: ZyrTEC Allergy Take 1 tablet (10 mg total) by mouth daily.   cholecalciferol 25 MCG (1000 UNIT) tablet Commonly known as: VITAMIN D3 Take 1 tablet (1,000 Units total) by mouth daily. To start after weekly Ergocalciferol is completed Start taking on: May 18, 2020   miconazole 2 % powder Commonly known as: MICOTIN Apply topically as needed for itching.   Vitamin D (Ergocalciferol) 1.25 MG (50000 UNIT) Caps capsule Commonly known as: DRISDOL Take 1 capsule (50,000 Units total) by mouth every 7 (seven) days.      Allergies  Allergen Reactions  . Pork-Derived Products     Religous beliefs. Ok with lovenox.    Follow-up Information    PCP. Schedule an appointment as soon as possible for a visit in 1 week(s).                The results of significant diagnostics from this hospitalization (including imaging, microbiology, ancillary and laboratory) are listed below for reference.    Significant  Diagnostic Studies: MR BRAIN WO CONTRAST  Result Date: 04/13/2020 CLINICAL DATA:  Paresthesia. Numbness and tingling right arm and leg. EXAM: MRI HEAD WITHOUT CONTRAST TECHNIQUE: Multiplanar, multiecho pulse sequences of the brain and surrounding structures were obtained without intravenous contrast. COMPARISON:  MRI brain 04/11/2020 FINDINGS: Brain: Limited protocol per Dr. Leonel Ramsay. Diffusion-weighted imaging and FLAIR imaging repeated. Diffusion hyperintensity  right parietal white matter on the prior study no longer identified. No areas of restricted diffusion. Mild periventricular white matter hyperintensities bilaterally similar to the prior study. Hyperintensity in the right lateral midbrain unchanged. Ventricle size normal.  Negative for mass lesion. IMPRESSION: Repeat imaging does not reveal restricted diffusion. No acute infarct. Periventricular white matter hyperintensities unchanged most likely due to multiple sclerosis. Electronically Signed   By: Franchot Gallo M.D.   On: 04/13/2020 13:42   MR Brain W and Wo Contrast  Result Date: 04/11/2020 CLINICAL DATA:  Multiple sclerosis, new event. Additional provided: Patient reports 2-3 days of brief periods of numbness in right arm and leg. EXAM: MRI HEAD WITHOUT AND WITH CONTRAST TECHNIQUE: Multiplanar, multiecho pulse sequences of the brain and surrounding structures were obtained without and with intravenous contrast. CONTRAST:  55mL GADAVIST GADOBUTROL 1 MMOL/ML IV SOLN COMPARISON:  No pertinent prior exams available for comparison. FINDINGS: Brain: Cerebral volume is normal for age. Ill-defined and scattered T2/FLAIR hyperintensity within the cerebral white matter, pons and right midbrain, nonspecific but possibly reflecting sequela of demyelinating disease given the provided history of multiple sclerosis. A 5 mm lesion within the right parietal white matter demonstrates subtle diffusion weighted hyperintensity and probable mild enhancement (series 3, image 28) (series 13, image 8). No enhancement identified elsewhere within the brain. 8 mm dural-based homogeneously enhancing mass overlying the posterior right frontal lobe likely reflecting a small incidental meningioma (series 13, image 13) (series 3, image 47). This mass abuts the superior sagittal sinus. No chronic intracranial blood products. No extra-axial fluid collection. No midline shift. Partially empty sella turcica. Vascular: Expected proximal  arterial flow voids. Skull and upper cervical spine: No focal marrow lesion. Sinuses/Orbits: Visualized orbits show no acute finding. Trace ethmoid sinus mucosal thickening. Moderate-sized right maxillary sinus mucous retention cyst. These results will be called to the ordering clinician or representative by the Radiologist Assistant, and communication documented in the PACS or [employer]. IMPRESSION: Ill-defined and scattered T2/FLAIR hyperintensity within the cerebral white matter, pons and right midbrain is nonspecific, but may reflect sequela of demyelinating disease given the provided history multiple sclerosis. A 5 mm lesion within the right parietal white matter demonstrates subtle diffusion weighted hyperintensity and probable mild enhancement. This may reflect a focus of active demyelination. A subacute infarct also a differential consideration. Incidentally noted 8 mm dural-based mass overlying the posterior right frontal lobe, likely a meningioma. Moderate-sized right maxillary sinus mucous retention cyst Electronically Signed   By: Kellie Simmering DO   On: 04/11/2020 18:34   MR CERVICAL SPINE W WO CONTRAST  Result Date: 04/13/2020 CLINICAL DATA:  Paresthesia.  Numbness and tingling on right. EXAM: MRI CERVICAL SPINE WITHOUT AND WITH CONTRAST TECHNIQUE: Multiplanar and multiecho pulse sequences of the cervical spine, to include the craniocervical junction and cervicothoracic junction, were obtained without and with intravenous contrast. CONTRAST:  12mL GADAVIST GADOBUTROL 1 MMOL/ML IV SOLN COMPARISON:  None. FINDINGS: Alignment: Normal alignment.  Cervical kyphosis. Vertebrae: Negative for fracture or mass.  Normal bone marrow. Cord: Ill-defined hyperintensity in the central cord at the C2-3 level. This does not enhance. Ill-defined hyperintensity in  the central cord at the C5-6 level which does not enhance. No other cord lesion.  Image quality degraded by motion. Posterior Fossa, vertebral  arteries, paraspinal tissues: Negative Disc levels: No significant spinal stenosis. No disc protrusion or significant degenerative change. IMPRESSION: Nonenhancing cord lesions at C2-3 and C5-6. Findings compatible with demyelinating disease from multiple sclerosis. Electronically Signed   By: Franchot Gallo M.D.   On: 04/13/2020 13:44   EEG adult  Result Date: 04/12/2020 Lora Havens, MD     04/12/2020  4:04 PM Patient Name: [name] MRN: [id number] Epilepsy Attending: Lora Havens Referring Physician/Provider: Dr Lesleigh Noe Date: 04/12/2020 Duration: 31.02 mins Patient history: 44 y.o. right-handed woman with no significant past medical history presenting with episodes of right arm and leg sensory symptoms. These are intermittent in nature but have been occurring more frequently in the past few days concerning for focal seizure. EEG to evaluate for seizure. Level of alertness: Awake,asleep AEDs during EEG study: None Technical aspects: This EEG study was done with scalp electrodes positioned according to the 10-20 International system of electrode placement. Electrical activity was acquired at a sampling rate of 500Hz  and reviewed with a high frequency filter of 70Hz  and a low frequency filter of 1Hz . EEG data were recorded continuously and digitally stored. Description: The posterior dominant rhythm consists of 111 Hz activity of moderate voltage (25-35 uV) seen predominantly in posterior head regions, symmetric and reactive to eye opening and eye closing. Sleep was characterized by vertex waves, sleep spindles (12 to 14 Hz), maximal frontocentral region.  Hyperventilation did not show any EEG change.  Physiologic photic driving was not seen during photic stimulation.   IMPRESSION: This study is within normal limits. No seizures or epileptiform discharges were seen throughout the recording. Priyanka Barbra Sarks   EEG LTVM - Continuous Bedside W/ Video Includes Portable EEG Read  Result Date:  04/13/2020 Lora Havens, MD     04/13/2020  1:26 PM Patient Name: [name] MRN: [id number] Epilepsy Attending: Lora Havens Referring Physician/Provider: Dr Lesleigh Noe Duration: 04/12/2020 1610 to 04/13/2020 1132  Patient history: 44 y.o. right-handedwoman with no significant past medical history presenting with episodes of right arm and leg sensory symptoms. These are intermittent in nature but have been occurring more frequently in the past few days concerning for focal seizure. EEG to evaluate for seizure.  Level of alertness: Awake,asleep  AEDs during EEG study: None  Technical aspects: This EEG study was done with scalp electrodes positioned according to the 10-20 International system of electrode placement. Electrical activity was acquired at a sampling rate of 500Hz  and reviewed with a high frequency filter of 70Hz  and a low frequency filter of 1Hz . EEG data were recorded continuously and digitally stored.  Description: The posterior dominant rhythm consists of 111 Hz activity of moderate voltage (25-35 uV) seen predominantly in posterior head regions, symmetric and reactive to eye opening and eye closing. Sleep was characterized by vertex waves, sleep spindles (12 to 14 Hz), maximal frontocentral region.  One event was recorded on 04/12/2020 at 2010 during which patient reported right arm paresthesias. Concomitant eeg before, during and after the event didn't show any eeg change to suggest seizure.  IMPRESSION: This study is within normal limits. No seizures or epileptiform discharges were seen throughout the recording. One event was recorded on 04/12/2020 at 2010 during which patient reported right arm paresthesias without concomitant eeg change. However, focal sensory seizures may not be always seen on  scalp eeg so clinical correlation is recommended.  Lora Havens    Microbiology: Recent Results (from the past 240 hour(s))  Resp Panel by RT-PCR (Flu A&B, Covid)  Nasopharyngeal Swab     Status: None   Collection Time: 04/11/20  6:51 PM   Specimen: Nasopharyngeal Swab; Nasopharyngeal(NP) swabs in vial transport medium  Result Value Ref Range Status   SARS Coronavirus 2 by RT PCR NEGATIVE NEGATIVE Final    Comment: (NOTE) SARS-CoV-2 target nucleic acids are NOT DETECTED.  The SARS-CoV-2 RNA is generally detectable in upper respiratory specimens during the acute phase of infection. The lowest concentration of SARS-CoV-2 viral copies this assay can detect is 138 copies/mL. A negative result does not preclude SARS-Cov-2 infection and should not be used as the sole basis for treatment or other patient management decisions. A negative result may occur with  improper specimen collection/handling, submission of specimen other than nasopharyngeal swab, presence of viral mutation(s) within the areas targeted by this assay, and inadequate number of viral copies(<138 copies/mL). A negative result must be combined with clinical observations, patient history, and epidemiological information. The expected result is Negative.  Fact Sheet for Patients:  EntrepreneurPulse.com.au  Fact Sheet for Healthcare Providers:  IncredibleEmployment.be  This test is no t yet approved or cleared by the [place] FDA and  has been authorized for detection and/or diagnosis of SARS-CoV-2 by FDA under an Emergency Use Authorization (EUA). This EUA will remain  in effect (meaning this test can be used) for the duration of the COVID-19 declaration under Section 564(b)(1) of the Act, 21 U.S.C.section 360bbb-3(b)(1), unless the authorization is terminated  or revoked sooner.       Influenza A by PCR NEGATIVE NEGATIVE Final   Influenza B by PCR NEGATIVE NEGATIVE Final    Comment: (NOTE) The Xpert Xpress SARS-CoV-2/FLU/RSV plus assay is intended as an aid in the diagnosis of influenza from Nasopharyngeal swab specimens and should not be  used as a sole basis for treatment. Nasal washings and aspirates are unacceptable for Xpert Xpress SARS-CoV-2/FLU/RSV testing.  Fact Sheet for Patients: EntrepreneurPulse.com.au  Fact Sheet for Healthcare Providers: IncredibleEmployment.be  This test is not yet approved or cleared by the [place] FDA and has been authorized for detection and/or diagnosis of SARS-CoV-2 by FDA under an Emergency Use Authorization (EUA). This EUA will remain in effect (meaning this test can be used) for the duration of the COVID-19 declaration under Section 564(b)(1) of the Act, 21 U.S.C. section 360bbb-3(b)(1), unless the authorization is terminated or revoked.  Performed at [facility], [place] [address], [place], [place] [address]      Labs: Basic Metabolic Panel: Recent Labs  Lab 04/11/20 1521  NA 139  K 3.8  CL 106  CO2 26  GLUCOSE 101*  BUN 10  CREATININE 0.64  CALCIUM 8.3*  MG 2.1   Liver Function Tests: No results for input(s): AST, ALT, ALKPHOS, BILITOT, PROT, ALBUMIN in the last 168 hours. No results for input(s): LIPASE, AMYLASE in the last 168 hours. No results for input(s): AMMONIA in the last 168 hours. CBC: Recent Labs  Lab 04/11/20 1521 04/12/20 0536  WBC 3.1* 3.3*  NEUTROABS 1.4*  --   HGB 11.5* 11.0*  HCT 40.1 35.8*  MCV 74.0* 70.3*  PLT 311 304   Cardiac Enzymes: No results for input(s): CKTOTAL, CKMB, CKMBINDEX, TROPONINI in the last 168 hours. BNP: BNP (last 3 results) No results for input(s): BNP in the last 8760 hours.  ProBNP (  last 3 results) No results for input(s): PROBNP in the last 8760 hours.  CBG: No results for input(s): GLUCAP in the last 168 hours.     Signed:  Domenic Polite MD.  [facility] 04/15/2020, 4:05 PM

## 2020-04-17 ENCOUNTER — Other Ambulatory Visit: Payer: Self-pay

## 2020-04-17 ENCOUNTER — Emergency Department (HOSPITAL_COMMUNITY): Payer: BC Managed Care – PPO

## 2020-04-17 ENCOUNTER — Emergency Department (HOSPITAL_COMMUNITY)
Admission: EM | Admit: 2020-04-17 | Discharge: 2020-04-17 | Disposition: A | Discharge status: Home / Self Care | Payer: BC Managed Care – PPO | Attending: Emergency Medicine | Admitting: Emergency Medicine

## 2020-04-17 ENCOUNTER — Encounter (HOSPITAL_COMMUNITY): Payer: Self-pay

## 2020-04-17 DIAGNOSIS — R200 Anesthesia of skin: Secondary | ICD-10-CM

## 2020-04-17 DIAGNOSIS — R202 Paresthesia of skin: Secondary | ICD-10-CM | POA: Diagnosis not present

## 2020-04-17 DIAGNOSIS — R079 Chest pain, unspecified: Secondary | ICD-10-CM

## 2020-04-17 DIAGNOSIS — U071 COVID-19: Secondary | ICD-10-CM | POA: Insufficient documentation

## 2020-04-17 DIAGNOSIS — R0789 Other chest pain: Secondary | ICD-10-CM | POA: Diagnosis present

## 2020-04-17 DIAGNOSIS — Z6830 Body mass index (BMI) 30.0-30.9, adult: Secondary | ICD-10-CM | POA: Insufficient documentation

## 2020-04-17 HISTORY — DX: Body mass index (BMI) 30.0-30.9, adult: Z68.30

## 2020-04-17 LAB — COMPREHENSIVE METABOLIC PANEL
ALT: 29 U/L (ref 0–44)
AST: 18 U/L (ref 15–41)
Albumin: 3.3 g/dL — ABNORMAL LOW (ref 3.5–5.0)
Alkaline Phosphatase: 47 U/L (ref 38–126)
Anion gap: 10 (ref 5–15)
BUN: 15 mg/dL (ref 6–20)
CO2: 26 mmol/L (ref 22–32)
Calcium: 8.3 mg/dL — ABNORMAL LOW (ref 8.9–10.3)
Chloride: 103 mmol/L (ref 98–111)
Creatinine, Ser: 0.66 mg/dL (ref 0.44–1.00)
GFR, Estimated: >60 mL/min (ref >60)
Glucose, Bld: 84 mg/dL (ref 70–99)
Potassium: 3.3 mmol/L — ABNORMAL LOW (ref 3.5–5.1)
Sodium: 139 mmol/L (ref 135–145)
Total Bilirubin: 0.3 mg/dL (ref 0.3–1.2)
Total Protein: 6.5 g/dL (ref 6.5–8.1)

## 2020-04-17 LAB — RESP PANEL BY RT-PCR (FLU A&B, COVID) ARPGX2
Influenza A by PCR: NEGATIVE
Influenza B by PCR: NEGATIVE
SARS Coronavirus 2 by RT PCR: POSITIVE — AB

## 2020-04-17 LAB — URINALYSIS, ROUTINE W REFLEX MICROSCOPIC
Bilirubin Urine: NEGATIVE
Glucose, UA: NEGATIVE mg/dL
Hgb urine dipstick: NEGATIVE
Ketones, ur: NEGATIVE mg/dL
Leukocytes,Ua: NEGATIVE
Nitrite: NEGATIVE
Protein, ur: NEGATIVE mg/dL
Specific Gravity, Urine: 1.005 (ref 1.005–1.030)
pH: 7 (ref 5.0–8.0)

## 2020-04-17 LAB — CBC WITH DIFFERENTIAL/PLATELET
Abs Immature Granulocytes: 0.03 10*3/uL (ref 0.00–0.07)
Basophils Absolute: 0 10*3/uL (ref 0.0–0.1)
Basophils Relative: 0 %
Eosinophils Absolute: 0.1 10*3/uL (ref 0.0–0.5)
Eosinophils Relative: 1 %
HCT: 38.4 % (ref 36.0–46.0)
Hemoglobin: 11.4 g/dL — ABNORMAL LOW (ref 12.0–15.0)
Immature Granulocytes: 0 %
Lymphocytes Relative: 38 %
Lymphs Abs: 3.3 10*3/uL (ref 0.7–4.0)
MCH: 21.4 pg — ABNORMAL LOW (ref 26.0–34.0)
MCHC: 29.7 g/dL — ABNORMAL LOW (ref 30.0–36.0)
MCV: 72.2 fL — ABNORMAL LOW (ref 80.0–100.0)
Monocytes Absolute: 0.5 10*3/uL (ref 0.1–1.0)
Monocytes Relative: 6 %
Neutro Abs: 4.6 10*3/uL (ref 1.7–7.7)
Neutrophils Relative %: 55 %
Platelets: 326 10*3/uL (ref 150–400)
RBC: 5.32 MIL/uL — ABNORMAL HIGH (ref 3.87–5.11)
RDW: 15.5 % (ref 11.5–15.5)
WBC: 8.5 10*3/uL (ref 4.0–10.5)
nRBC: 0 % (ref 0.0–0.2)

## 2020-04-17 LAB — METHYLMALONIC ACID, SERUM: Methylmalonic Acid, Quantitative: 154 nmol/L (ref 0–378)

## 2020-04-17 LAB — TROPONIN I (HIGH SENSITIVITY)
Troponin I (High Sensitivity): 4 ng/L (ref <18)
Troponin I (High Sensitivity): 2 ng/L (ref <18)

## 2020-04-17 IMAGING — DX DG CHEST 1V PORT
1 series · 1 of 1 positions shown · non-contrast
Comparison: Chest x-ray [DATE].

CLINICAL DATA: COVID.

EXAM:
PORTABLE CHEST 1 VIEW

[chest ap]
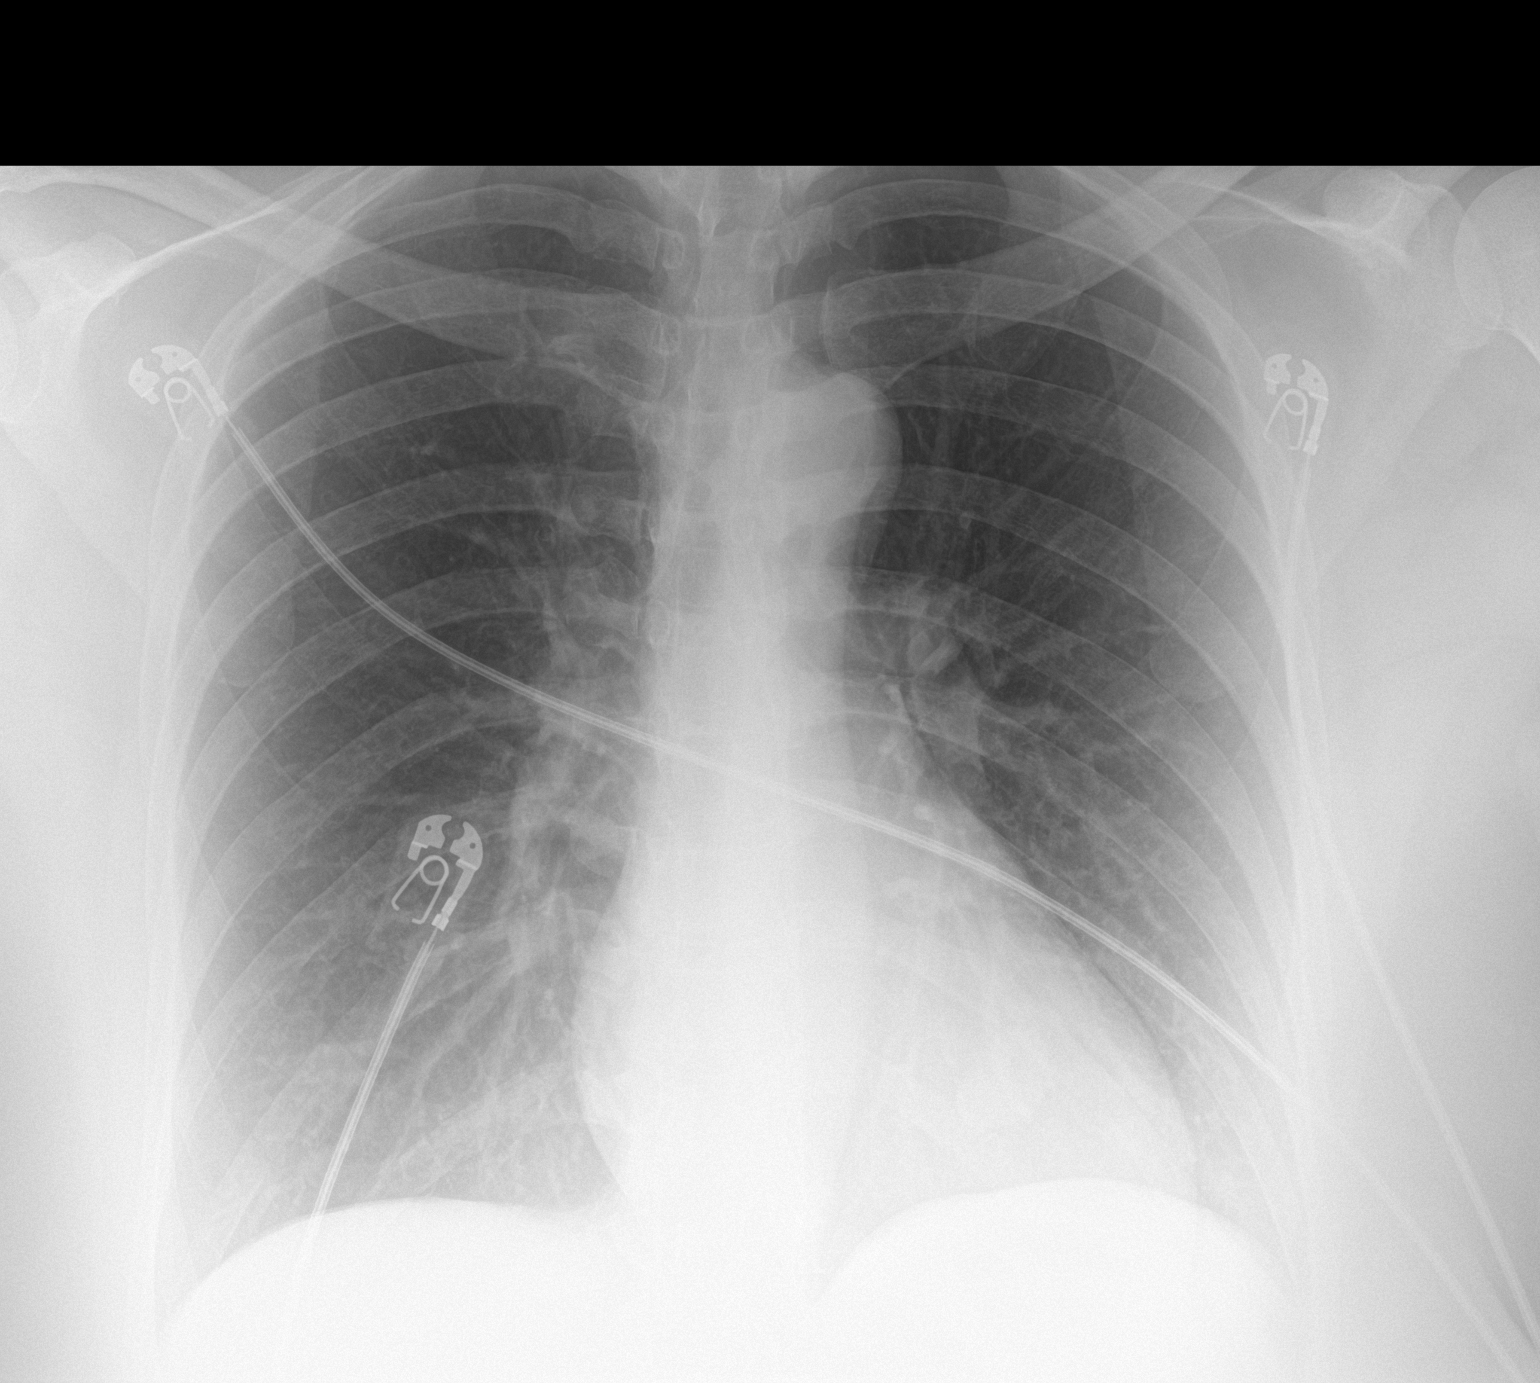

[1 of 1 positions shown; findings below may reference images not displayed]

FINDINGS: Mediastinum and hilar structures normal. Heart size normal. Very
mild increase interstitial prominence in both lung bases cannot be
excluded. No pleural effusion or pneumothorax. No acute bony
abnormality.
IMPRESSION: Very mild increase interstitial prominence in both lung bases cannot
be excluded. Mild pneumonitis cannot be completely excluded. No
focal alveolar infiltrate.

## 2020-04-17 MED ORDER — POTASSIUM CHLORIDE CRYS ER 20 MEQ PO TBCR
20.0000 meq | EXTENDED_RELEASE_TABLET | Freq: Once | ORAL | Status: AC
  Administered 2020-04-17: 20 meq via ORAL
  Filled 2020-04-17: qty 1

## 2020-04-17 MED ORDER — ACETAMINOPHEN 500 MG PO TABS
1000.0000 mg | ORAL_TABLET | Freq: Once | ORAL | Status: AC
  Administered 2020-04-17: 1000 mg via ORAL
  Filled 2020-04-17: qty 2

## 2020-04-17 MED ORDER — SODIUM CHLORIDE 0.9 % IV SOLN
1000.0000 mg | Freq: Once | INTRAVENOUS | Status: AC
  Administered 2020-04-17: 1000 mg via INTRAVENOUS
  Filled 2020-04-17: qty 8

## 2020-04-17 NOTE — ED Provider Notes (Signed)
[facility] DEPT Provider Note   CSN: [id number] Arrival date & time: 04/17/20  1032     History Chief Complaint  Patient presents with   Dizziness   Chest Pain    JAYLI FOGLEMAN is a 44 y.o. female.  HPI Patient is a 44 year old female who presents the emergency department with multiple complaints.  She was recently admitted for 4 days due to right-sided numbness and tingling.  She states her symptoms are intermittent.  She had an MRI of the brain and C-spine which showed multiple T2 lesions including spinal lesions and a single enhancing lesion which was most consistent with multiple sclerosis.  She was put on a 3-day course of IV Solu-Medrol and was discharged with a neurology referral.  Patient states since being discharged she has continued to experience intermittent numbness and tingling.  She states that it is now in the left arm and left leg as well.  She also reports intermittent burning/squeezing central chest pain that has been occurring.  No modifying factors.  No shortness of breath.  Lastly pt notes intermittent headaches and a "heaviness in her head". No dizziness or lightheadedness. Patient also notes that her 2 children tested positive for Covid 3 days ago.  She has been in close contact with her children.  She has not been vaccinated for Covid.  She denies any cough, rhinorrhea, sore throat, abdominal pain, nausea, vomiting.  Recent admission on December 4 and discharged on December 8 for right-sided numbness and tingling.  Discharge summary below:  Hospital Course:  44 year old female with history of uterine fibroids presented to the ED with 3-day history of intermittent numbness in her right leg and right arm. -MRI of brain and C-spine were recommended by neurology, this noted multiple T2 lesions including spinal lesions and a single enhancing lesion as previously seen on last MRI brain most consistent with multiple sclerosis  -Neurology  recommended 3-day course of IV Solu-Medrol which was completed today  -Vitamin D level was noted to be very low at 13 and supplement was started, vitamin B12 was normal -Referral for neurology completed for follow-up in 2 weeks to consider disease modifying therapy  -ANA, SSA, SSB pending at the time of discharge     Past Medical History:  Diagnosis Date   Uterine fibroid     Patient Active Problem List   Diagnosis Date Noted   Numbness and tingling of right arm and leg 04/12/2020   Viral URI with cough 06/06/2018    Past Surgical History:  Procedure Laterality Date   ABDOMINAL HYSTERECTOMY     2010   CESAREAN SECTION     x 2     OB History    Gravida  7   Para  5   Term  4   Preterm  1   AB  2   Living  5     SAB  1   IAB  1   Ectopic      Multiple      Live Births              Family History  Problem Relation Age of Onset   Cancer Mother    HIV Father    Multiple sclerosis Daughter    Multiple sclerosis Cousin     Social History   Tobacco Use   Smoking status: Never Smoker   Smokeless tobacco: Never Used  Vaping Use   Vaping Use: Never used  Substance Use Topics  Alcohol use: No   Drug use: No    Home Medications Prior to Admission medications   Medication Sig Start Date End Date Taking? Authorizing Provider  cetirizine (ZYRTEC ALLERGY) 10 MG tablet Take 1 tablet (10 mg total) by mouth daily. 06/17/19   LampteyMyrene Galas, MD  cholecalciferol (VITAMIN D3) 25 MCG (1000 UNIT) tablet Take 1 tablet (1,000 Units total) by mouth daily. To start after weekly Ergocalciferol is completed 05/18/20   Domenic Polite, MD  miconazole (MICOTIN) 2 % powder Apply topically as needed for itching. 06/17/19   Lamptey, Myrene Galas, MD  Vitamin D, Ergocalciferol, (DRISDOL) 1.25 MG (50000 UNIT) CAPS capsule Take 1 capsule (50,000 Units total) by mouth every 7 (seven) days. 04/15/20   Domenic Polite, MD  LORATADINE PO Take by mouth.  06/17/19   [provider]    Allergies    Pork-derived products  Review of Systems   Review of Systems  All other systems reviewed and are negative. Ten systems reviewed and are negative for acute change, except as noted in the HPI.   Physical Exam Updated Vital Signs Ht 5\' 8"  (1.727 m)    Wt 94.3 kg    BMI 31.63 kg/m   Physical Exam Vitals and nursing note reviewed.  Constitutional:      General: She is not in acute distress.    Appearance: Normal appearance. She is not ill-appearing, toxic-appearing or diaphoretic.  HENT:     Head: Normocephalic and atraumatic.     Right Ear: External ear normal.     Left Ear: External ear normal.     Nose: Nose normal.     Mouth/Throat:     Mouth: Mucous membranes are moist.     Pharynx: Oropharynx is clear. No oropharyngeal exudate or posterior oropharyngeal erythema.  Eyes:     Extraocular Movements: Extraocular movements intact.  Cardiovascular:     Rate and Rhythm: Normal rate and regular rhythm.     Pulses: Normal pulses.          Radial pulses are 2+ on the right side and 2+ on the left side.       Dorsalis pedis pulses are 2+ on the right side and 2+ on the left side.     Heart sounds: Normal heart sounds. Heart sounds not distant. No murmur heard.  No systolic murmur is present.  No diastolic murmur is present. No friction rub. No gallop.      Comments: RRR without M/R/G. Pulmonary:     Effort: Pulmonary effort is normal. No tachypnea, accessory muscle usage or respiratory distress.     Breath sounds: Normal breath sounds. No stridor. No decreased breath sounds, wheezing, rhonchi or rales.  Abdominal:     General: Abdomen is flat.     Palpations: Abdomen is soft.     Tenderness: There is no abdominal tenderness.  Musculoskeletal:        General: Normal range of motion.     Cervical back: Normal range of motion and neck supple. No tenderness.     Right lower leg: No edema.     Left lower leg: No edema.  Skin:    General:  Skin is warm and dry.  Neurological:     General: No focal deficit present.     Mental Status: She is alert and oriented to person, place, and time.     Comments: No gross deficits.  Strength 5 out of 5.  Moving all 4 extremities.  Distal sensation intact.  Psychiatric:        Mood and Affect: Mood normal.        Behavior: Behavior normal.    ED Results / Procedures / Treatments   Labs (all labs ordered are listed, but only abnormal results are displayed) Labs Reviewed  RESP PANEL BY RT-PCR (FLU A&B, COVID) ARPGX2 - Abnormal; Notable for the following components:      Result Value   SARS Coronavirus 2 by RT PCR POSITIVE (*)    All other components within normal limits  COMPREHENSIVE METABOLIC PANEL - Abnormal; Notable for the following components:   Potassium 3.3 (*)    Calcium 8.3 (*)    Albumin 3.3 (*)    All other components within normal limits  CBC WITH DIFFERENTIAL/PLATELET - Abnormal; Notable for the following components:   RBC 5.32 (*)    Hemoglobin 11.4 (*)    MCV 72.2 (*)    MCH 21.4 (*)    MCHC 29.7 (*)    All other components within normal limits  URINALYSIS, ROUTINE W REFLEX MICROSCOPIC  TROPONIN I (HIGH SENSITIVITY)  TROPONIN I (HIGH SENSITIVITY)    ED ECG REPORT   Date: 04/17/2020  Rate: 81  Rhythm: normal sinus rhythm  QRS Axis: normal  Intervals: normal  ST/T Wave abnormalities: normal  Conduction Disutrbances:none  Narrative Interpretation:   Old EKG Reviewed: none available  I have personally reviewed the EKG tracing and agree with the computerized printout as noted.  Radiology DG Chest Portable 1 View  Result Date: 04/17/2020 CLINICAL DATA:  COVID. EXAM: PORTABLE CHEST 1 VIEW COMPARISON:  Chest x-ray 10/16/2008. FINDINGS: Mediastinum and hilar structures normal. Heart size normal. Very mild increase interstitial prominence in both lung bases cannot be excluded. No pleural effusion or pneumothorax. No acute bony abnormality. IMPRESSION: Very  mild increase interstitial prominence in both lung bases cannot be excluded. Mild pneumonitis cannot be completely excluded. No focal alveolar infiltrate. Electronically Signed   By: Marcello Moores  Register   On: 04/17/2020 14:59   Procedures Procedures (including critical care time)  Medications Ordered in ED Medications  methylPREDNISolone sodium succinate (SOLU-MEDROL) 1,000 mg in sodium chloride 0.9 % 50 mL IVPB (0 mg Intravenous Stopped 04/17/20 1328)  acetaminophen (TYLENOL) tablet 1,000 mg (1,000 mg Oral Given 04/17/20 1201)  potassium chloride SA (KLOR-CON) CR tablet 20 mEq (20 mEq Oral Given 04/17/20 1410)    ED Course  I have reviewed the triage vital signs and the nursing notes.  Pertinent labs & imaging results that were available during my care of the patient were reviewed by me and considered in my medical decision making (see chart for details).  Clinical Course as of 04/17/20 1248  Fri Apr 17, 2020  1134 Hemoglobin(!): 11.4 Similar to baseline values [LJ]  1140 I spoke to Dr. Leonel Ramsay with neurology.  I am concerned her symptoms regarding her chest pain are likely "MS hug" versus cardiac in nature.  Will obtain a basic cardiac work-up.  Dr. Leonel Ramsay recommends 1 g of IV Solu-Medrol here in the emergency department and 500 mg of prednisone tomorrow and a single dose.  No need for readmission.  Outpatient neurology follow-up. [LJ]  1219 SARS Coronavirus 2 by RT PCR(!): POSITIVE [LJ]    Clinical Course User Index [LJ] Rayna Sexton, PA-C   MDM Rules/Calculators/A&P                          Patient is a 44 year old female who presents to the  emergency department with numbness, tingling, chest pain.  Patient's numbness and tingling started about 1 week ago and she was admitted for this.  She had an MRI of her brain and cervical spine which was concerning for multiple sclerosis.  She was given 3 days of Solu-Medrol and discharged in stable condition.  She feels that her  numbness and tingling has continued and is now on the left side of her body.  I spoke to Dr. Leonel Ramsay with neurology who recommended additional IV Solu-Medrol.  The initial recommendation was also for patient to take 500 mg of prednisone tomorrow but he was reconsulted given her COVID-19 diagnosis today and recommended we withhold this to discontinue further immunosuppression.  No gross deficits noted on my exam.  Distal sensation appears to be intact.  Strength is 5 out of 5.  Patient also experiencing intermittent burning/chest tightness.  This started about 2 days ago.  Concern for ACS versus MS hug.  ECG evaluated by my attending physician and is normal sinus rhythm.  Initial troponin IV with a delta of 2.  Chest x-ray showing very mild increase in the interstitial prominence in both lung bases which cannot be excluded.  Mild pneumonitis cannot be completely excluded.  No focal alveolar infiltrate.  Doubt ACS at this time.  No tachycardia.  No hypoxia.  Patient was found to be positive for COVID-19 today.  After patient's recent discharge from the hospital she had positive Covid contacts in her home.  She has not been vaccinated for COVID-19.  I reached out to the [facility] who are going to reach out to her in the next 1 to 2 days to schedule an appointment for her to receive the infusion.  Patient states she is working to schedule her neurology follow-up at this time.  She understands she needs to return to the ER with any new or worsening symptoms.  Her questions were answered and she was amicable at the time of discharge.   An After Visit Summary was printed and given to the patient.  Patient discharged to home/self care.  Condition at discharge: Stable  Note: Portions of this report may have been transcribed using voice recognition software. Every effort was made to ensure accuracy; however, inadvertent computerized transcription errors may be present.    Final Clinical Impression(s) /  ED Diagnoses Final diagnoses:  COVID-19  Numbness and tingling  Chest pain, unspecified type   Rx / DC Orders ED Discharge Orders    None       Rayna Sexton, PA-C 04/17/20 1513    Daleen Bo, MD 04/18/20 2030

## 2020-04-17 NOTE — ED Notes (Signed)
Patient ambulatory to restroom  ?

## 2020-04-17 NOTE — ED Triage Notes (Signed)
Per patient numbness to hands and legs, chest pain, headache, and dizziness. Patient states all symptoms prior to recent discharge from hospital 2 days ago except the dizziness.

## 2020-04-17 NOTE — Discharge Instructions (Addendum)
Like we discussed, you should hear from the monoclonal antibody infusion clinic in the next 1 to 2 days.  I would recommend that you follow-up with them and make sure that you get the monoclonal antibody infusion for your COVID-19 diagnosis.  Also make sure that you follow-up with your neurologist regarding your recent admission.  If you develop any new or worsening symptoms whatsoever, you need to return to the emergency department for immediate reevaluation.  It was a pleasure to meet you.

## 2020-04-17 NOTE — ED Triage Notes (Signed)
Patient states kids tested positive for Covid on 04/14/2020

## 2020-04-18 ENCOUNTER — Ambulatory Visit (HOSPITAL_COMMUNITY)
Admission: RE | Admit: 2020-04-18 | Discharge: 2020-04-18 | Disposition: A | Discharge status: Home / Self Care | Payer: BC Managed Care – PPO | Source: Ambulatory Visit | Attending: Pulmonary Disease | Admitting: Pulmonary Disease

## 2020-04-18 ENCOUNTER — Other Ambulatory Visit: Payer: Self-pay | Admitting: Physician Assistant

## 2020-04-18 DIAGNOSIS — Z6830 Body mass index (BMI) 30.0-30.9, adult: Secondary | ICD-10-CM | POA: Insufficient documentation

## 2020-04-18 DIAGNOSIS — U071 COVID-19: Secondary | ICD-10-CM | POA: Diagnosis present

## 2020-04-18 DIAGNOSIS — G35D Multiple sclerosis, unspecified: Secondary | ICD-10-CM

## 2020-04-18 DIAGNOSIS — G35 Multiple sclerosis: Secondary | ICD-10-CM

## 2020-04-18 MED ORDER — EPINEPHRINE 0.3 MG/0.3ML IJ SOAJ: 0.3000 mg | Freq: Once | INTRAMUSCULAR | Status: DC | PRN

## 2020-04-18 MED ORDER — METHYLPREDNISOLONE SODIUM SUCC 125 MG IJ SOLR: 125.0000 mg | Freq: Once | INTRAMUSCULAR | Status: DC | PRN

## 2020-04-18 MED ORDER — ALBUTEROL SULFATE HFA 108 (90 BASE) MCG/ACT IN AERS: 2.0000 | INHALATION_SPRAY | Freq: Once | RESPIRATORY_TRACT | Status: DC | PRN

## 2020-04-18 MED ORDER — DIPHENHYDRAMINE HCL 50 MG/ML IJ SOLN: 50.0000 mg | Freq: Once | INTRAMUSCULAR | Status: DC | PRN

## 2020-04-18 MED ORDER — SODIUM CHLORIDE 0.9 % IV SOLN: INTRAVENOUS | Status: DC | PRN

## 2020-04-18 MED ORDER — SODIUM CHLORIDE 0.9 % IV SOLN: Freq: Once | INTRAVENOUS | Status: AC

## 2020-04-18 MED ORDER — FAMOTIDINE IN NACL 20-0.9 MG/50ML-% IV SOLN
20.0000 mg | Freq: Once | INTRAVENOUS | Status: AC | PRN
  Administered 2020-04-18: 20 mg via INTRAVENOUS
  Filled 2020-04-18: qty 50

## 2020-04-18 NOTE — Progress Notes (Signed)
Patient reviewed Fact Sheet for Patients, Parents, and Caregivers for Emergency Use Authorization (EUA) of Sotrovimab for the Treatment of Coronavirus. Patient also reviewed and is agreeable to the estimated cost of treatment. Patient is agreeable to proceed.   

## 2020-04-18 NOTE — Discharge Instructions (Signed)
10 Things You Can Do to Manage Your COVID-19 Symptoms at Home If you have possible or confirmed COVID-19: 1. Stay home from work and school. And stay away from other public places. If you must go out, avoid using any kind of public transportation, ridesharing, or taxis. 2. Monitor your symptoms carefully. If your symptoms get worse, call your healthcare provider immediately. 3. Get rest and stay hydrated. 4. If you have a medical appointment, call the healthcare provider ahead of time and tell them that you have or may have COVID-19. 5. For medical emergencies, call 911 and notify the dispatch personnel that you have or may have COVID-19. 6. Cover your cough and sneezes with a tissue or use the inside of your elbow. 7. Wash your hands often with soap and water for at least 20 seconds or clean your hands with an alcohol-based hand sanitizer that contains at least 60% alcohol. 8. As much as possible, stay in a specific room and away from other people in your home. Also, you should use a separate bathroom, if available. If you need to be around other people in or outside of the home, wear a mask. 9. Avoid sharing personal items with other people in your household, like dishes, towels, and bedding. 10. Clean all surfaces that are touched often, like counters, tabletops, and doorknobs. Use household cleaning sprays or wipes according to the label instructions. cdc.gov/coronavirus 11/07/2018 This information is not intended to replace advice given to you by your health care provider. Make sure you discuss any questions you have with your health care provider. Document Revised: 04/11/2019 Document Reviewed: 04/11/2019 Elsevier Patient Education  2020 Elsevier Inc. What types of side effects do monoclonal antibody drugs cause?  Common side effects  In general, the more common side effects caused by monoclonal antibody drugs include: . Allergic reactions, such as hives or itching . Flu-like signs and  symptoms, including chills, fatigue, fever, and muscle aches and pains . Nausea, vomiting . Diarrhea . Skin rashes . Low blood pressure   The CDC is recommending patients who receive monoclonal antibody treatments wait at least 90 days before being vaccinated.  Currently, there are no data on the safety and efficacy of mRNA COVID-19 vaccines in persons who received monoclonal antibodies or convalescent plasma as part of COVID-19 treatment. Based on the estimated half-life of such therapies as well as evidence suggesting that reinfection is uncommon in the 90 days after initial infection, vaccination should be deferred for at least 90 days, as a precautionary measure until additional information becomes available, to avoid interference of the antibody treatment with vaccine-induced immune responses. If you have any questions or concerns after the infusion please call the [facility] Provider on call at [phone]. This number is ONLY intended for your use regarding questions or concerns about the infusion post-treatment side-effects.  Please do not provide this number to others for use. For return to work notes please contact your primary care provider.   If someone you know is interested in receiving treatment please have them call the COVID hotline at [phone].   

## 2020-04-18 NOTE — Progress Notes (Signed)
Pt experiencing radiating upper chest pain, bilaterally.  Denies any swelling or burning in throat.  Pt states has happened before and resolves with time.  Adjusted pt from lying in recliner to sitting.  Spoke with Belenda Cruise, NP and order received for Pepcid 20mg .

## 2020-04-18 NOTE — Progress Notes (Signed)
I connected by phone with [name] on 04/18/2020 at 12:35 PM to discuss the potential use of a new treatment for mild to moderate COVID-19 viral infection in non-hospitalized patients.  This patient is a 44 y.o. female that meets the FDA criteria for Emergency Use Authorization of COVID monoclonal antibody sotrovimab, casirivimab/imdevimab or bamlamivimab/estevimab.  Has a (+) direct SARS-CoV-2 viral test result  Has mild or moderate COVID-19   Is NOT hospitalized due to COVID-19  Is within 10 days of symptom onset  Has at least one of the high risk factor(s) for progression to severe COVID-19 and/or hospitalization as defined in EUA.  Specific high risk criteria : BMI > 25, Immunosuppressive Disease or Treatment and Other high risk medical condition per CDC:  high SVI   I have spoken and communicated the following to the patient or parent/caregiver regarding COVID monoclonal antibody treatment:  1. FDA has authorized the emergency use for the treatment of mild to moderate COVID-19 in adults and pediatric patients with positive results of direct SARS-CoV-2 viral testing who are 22 years of age and older weighing at least 40 kg, and who are at high risk for progressing to severe COVID-19 and/or hospitalization.  2. The significant known and potential risks and benefits of COVID monoclonal antibody, and the extent to which such potential risks and benefits are unknown.  3. Information on available alternative treatments and the risks and benefits of those alternatives, including clinical trials.  4. Patients treated with COVID monoclonal antibody should continue to self-isolate and use infection control measures (e.g., wear mask, isolate, social distance, avoid sharing personal items, clean and disinfect "high touch" surfaces, and frequent handwashing) according to CDC guidelines.   5. The patient or parent/caregiver has the option to accept or refuse COVID monoclonal antibody  treatment.  After reviewing this information with the patient, the patient has agreed to receive one of the available covid 19 monoclonal antibodies and will be provided an appropriate fact sheet prior to infusion.  Sx onset 12/1. Set up for infusion on 12/11 @ 4:30. Directions given to [facility] Pt is aware that insurance will be charged an infusion fee. She is unvaccinated. + in epic.   Angelena Form 04/18/2020 12:35 PM

## 2020-04-18 NOTE — Progress Notes (Signed)
°  Diagnosis: COVID-19  Physician: Dr. Joya Gaskins  Procedure: Covid Infusion Clinic Med: bamlanivimab\etesevimab infusion - Provided patient with bamlanimivab\etesevimab fact sheet for patients, parents and caregivers prior to infusion.  Complications: No immediate complications noted.  Discharge: Discharged home   [place] 04/18/2020

## 2020-05-11 ENCOUNTER — Ambulatory Visit: Payer: BC Managed Care – PPO | Admitting: Neurology

## 2020-05-21 ENCOUNTER — Ambulatory Visit (INDEPENDENT_AMBULATORY_CARE_PROVIDER_SITE_OTHER): Payer: BC Managed Care – PPO | Admitting: Neurology

## 2020-05-21 ENCOUNTER — Telehealth: Payer: Self-pay | Admitting: *Deleted

## 2020-05-21 ENCOUNTER — Other Ambulatory Visit: Payer: Self-pay

## 2020-05-21 ENCOUNTER — Encounter: Payer: Self-pay | Admitting: Neurology

## 2020-05-21 VITALS — BP 110/80 | HR 80 | Ht 68.0 in | Wt 215.0 lb

## 2020-05-21 DIAGNOSIS — R202 Paresthesia of skin: Secondary | ICD-10-CM

## 2020-05-21 DIAGNOSIS — G35 Multiple sclerosis: Secondary | ICD-10-CM | POA: Diagnosis not present

## 2020-05-21 DIAGNOSIS — R200 Anesthesia of skin: Secondary | ICD-10-CM | POA: Diagnosis not present

## 2020-05-21 MED ORDER — VITAMIN D (ERGOCALCIFEROL) 1.25 MG (50000 UNIT) PO CAPS: 50000.0000 [IU] | ORAL_CAPSULE | ORAL | 1 refills | Status: DC

## 2020-05-21 NOTE — Telephone Encounter (Signed)
Faxed completed/signed glatiramer acetate 40mg /ml to [facility] at [phone]. Received fax confirmation.

## 2020-05-21 NOTE — Progress Notes (Signed)
[facility]  PATIENT: [name] DOB: [birth year]  REFERRING DOCTOR OR PCP: Roland Rack, MD SOURCE: Patient, notes from recent hospital admission, imaging and laboratory reports, MRI images personally reviewed.  _________________________________   HISTORICAL  CHIEF COMPLAINT:  Chief Complaint  Patient presents with  . New Patient (Initial Visit)    RM 12, alone. Internal referral for MS from [facility]. Was at [facility] 04/11/20-04/15/20, received 3 days IV steroids. Went back on 04/17/20. Having intermittent numbness/tingling both arms/legs. Denies any vision problems. Daughter also has MS. Dx recently. Maternal cousin also has MS. Denies any recent falls.    HISTORY OF PRESENT ILLNESS:  I had the pleasure of seeing your patient, [name], at [facility] for neurologic consultation regarding a recent diagnosis of relapsing remitting multiple sclerosis.  She is a 45 year old woman who was experiencing right-sided paresthesias in the right arm and leg for a few weeks..  Symptoms would come and go.  She had no weakness or gait difficulty.    She presented to the emergency room.  MRI of the brain showed some white matter foci.  This was followed up with an MRI of the cervical spine that showed 2 foci, one centrally at C2-C3 and 1 posteriorly to the right at C5-C6.   She continues to have episodes of random numbness with some tingling.    Sometimes she has symptoms shooting down her spine.     She has had some shooting pain down her neck into her back lasting seconds off/on since her 20's.   She notes a tight dysesthetic sensation in her upper chest.   She still has the right sided numbness lasting a minute or two about once a day now.     She had Covid diagnosed a few days after her hospitalization 04/2020.     She gets migraine headaches on a near daily basis.    She sometimes takes an NSAID.     MRI of the brain 04/11/2020 showed multiple  T2/FLAIR hyperintense foci in the periventricular, juxtacortical and deep white matter.  One focus in the right parietal lobe had subtle enhancement..  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  MRI of the cervical spine 04/13/2020 showed T2 hyperintense foci centrally adjacent to C2-C3, posteriorly to the right adjacent to C5-C6.  He had mild disc bulges and reversal of the cervical curvature.  Vitamin D is low at 13.  ANA, SSA/SSB are normal/negative.  SARS COVID 2 PCR was negative  Her daughter was diagnosed with MS diagnosed in November 2021 (presented with diplopia).  She is on Gilenya.   She also ha a first cousin with MS.      REVIEW OF SYSTEMS: Constitutional: No fevers, chills, sweats, or change in appetite Eyes: No visual changes, double vision, eye pain Ear, nose and throat: No hearing loss, ear pain, nasal congestion, sore throat Cardiovascular: No chest pain, palpitations Respiratory: No shortness of breath at rest or with exertion.   No wheezes GastrointestinaI: No nausea, vomiting, diarrhea, abdominal pain, fecal incontinence Genitourinary: No dysuria, urinary retention or frequency.  No nocturia. Musculoskeletal: No neck pain, back pain Integumentary: No rash, pruritus, skin lesions Neurological: as above Psychiatric: No depression at this time.  No anxiety Endocrine: No palpitations, diaphoresis, change in appetite, change in weigh or increased thirst Hematologic/Lymphatic: No anemia, purpura, petechiae. Allergic/Immunologic: No itchy/runny eyes, nasal congestion, recent allergic reactions, rashes  ALLERGIES: Allergies  Allergen Reactions  . Pork-Derived  Products     Religous beliefs. Ok with lovenox.    HOME MEDICATIONS:  Current Outpatient Medications:  .  aspirin 325 MG EC tablet, Take 650 mg by mouth every 6 (six) hours as needed for pain., Disp: , Rfl:  .  cholecalciferol (VITAMIN D3) 25 MCG (1000 UNIT) tablet, Take 1  tablet (1,000 Units total) by mouth daily. To start after weekly Ergocalciferol is completed, Disp: 30 tablet, Rfl: 1 .  Vitamin D, Ergocalciferol, (DRISDOL) 1.25 MG (50000 UNIT) CAPS capsule, Take 1 capsule (50,000 Units total) by mouth every 7 (seven) days., Disp: 13 capsule, Rfl: 1 .  Glatiramer Acetate 40 MG/ML SOSY, Inject into the skin., Disp: , Rfl:   PAST MEDICAL HISTORY: Past Medical History:  Diagnosis Date  . BMI 30.0-30.9,adult   . Uterine fibroid     PAST SURGICAL HISTORY: Past Surgical History:  Procedure Laterality Date  . ABDOMINAL HYSTERECTOMY     2010  . CESAREAN SECTION     x 2    FAMILY HISTORY: Family History  Problem Relation Age of Onset  . Cancer Mother   . HIV Father   . Multiple sclerosis Daughter   . Multiple sclerosis Cousin     SOCIAL HISTORY:  Social History   Socioeconomic History  . Marital status: Single    Spouse name: Not on file  . Number of children: Not on file  . Years of education: Not on file  . Highest education level: Not on file  Occupational History  . Not on file  Tobacco Use  . Smoking status: Never Smoker  . Smokeless tobacco: Never Used  Vaping Use  . Vaping Use: Never used  Substance and Sexual Activity  . Alcohol use: No  . Drug use: No  . Sexual activity: Not on file  Other Topics Concern  . Not on file  Social History Narrative   Right handed   1 cup coffee per day   Social Determinants of Health   Financial Resource Strain: Not on file  Food Insecurity: Not on file  Transportation Needs: Not on file  Physical Activity: Not on file  Stress: Not on file  Social Connections: Not on file  Intimate Partner Violence: Not on file     PHYSICAL EXAM  Vitals:   05/21/20 1322  BP: 110/80  Pulse: 80  SpO2: 97%  Weight: 215 lb (97.5 kg)  Height: 5\' 8"  (1.727 m)    Body mass index is 32.69 kg/m.   General: The patient is well-developed and well-nourished and in no acute distress  HEENT:  Head  is [place]/AT.  Sclera are anicteric.  Funduscopic exam shows normal optic discs and retinal vessels.  Neck: No carotid bruits are noted.  The neck is nontender.  Cardiovascular: The heart has a regular rate and rhythm with a normal S1 and S2. There were no murmurs, gallops or rubs.    Skin: Extremities are without rash or  edema.  Musculoskeletal:  Back is nontender  Neurologic Exam  Mental status: The patient is alert and oriented x 3 at the time of the examination. The patient has apparent normal recent and remote memory, with an apparently normal attention span and concentration ability.   Speech is normal.  Cranial nerves: Extraocular movements are full. Pupils are equal, round, and reactive to light and accomodation.  Color vision was normal.  There is good facial sensation to soft touch bilaterally.Facial strength is normal.  Trapezius and sternocleidomastoid strength is normal. No dysarthria  is noted.  The tongue is midline, and the patient has symmetric elevation of the soft palate. No obvious hearing deficits are noted.  Motor:  Muscle bulk is normal.   Tone is normal. Strength is  5 / 5 in all 4 extremities.   Sensory: Sensory testing is intact to pinprick, soft touch and vibration sensation in all 4 extremities.  Coordination: Cerebellar testing reveals good finger-nose-finger and heel-to-shin bilaterally.  Gait and station: Station is normal.   Gait is normal. Tandem gait is mildly wide. Romberg is negative.   Reflexes: Deep tendon reflexes are symmetric and normal bilaterally.   Plantar responses are flexor.    DIAGNOSTIC DATA (LABS, IMAGING, TESTING) - I reviewed patient records, labs, notes, testing and imaging myself where available.  Lab Results  Component Value Date   WBC 8.5 04/17/2020   HGB 11.4 (L) 04/17/2020   HCT 38.4 04/17/2020   MCV 72.2 (L) 04/17/2020   PLT 326 04/17/2020      Component Value Date/Time   NA 139 04/17/2020 1119   K 3.3 (L) 04/17/2020  1119   CL 103 04/17/2020 1119   CO2 26 04/17/2020 1119   GLUCOSE 84 04/17/2020 1119   BUN 15 04/17/2020 1119   CREATININE 0.66 04/17/2020 1119   CALCIUM 8.3 (L) 04/17/2020 1119   PROT 6.5 04/17/2020 1119   ALBUMIN 3.3 (L) 04/17/2020 1119   AST 18 04/17/2020 1119   ALT 29 04/17/2020 1119   ALKPHOS 47 04/17/2020 1119   BILITOT 0.3 04/17/2020 1119   GFRNONAA >60 04/17/2020 1119   GFRAA >60 07/13/2017 0339   Lab Results  Component Value Date   CHOL 118 04/11/2020   HDL 37 (L) 04/11/2020   LDLCALC 72 04/11/2020   TRIG 43 04/11/2020   CHOLHDL 3.2 04/11/2020   Lab Results  Component Value Date   HGBA1C 5.9 (H) 04/11/2020   Lab Results  Component Value Date   JXBJYNWG95 621 04/12/2020   No results found for: TSH     ASSESSMENT AND PLAN  Multiple sclerosis (HCC)  Numbness and tingling of right arm and leg  In summary, Ms. Burandt is a 45 year old woman with a recent diagnosis of multiple sclerosis.  MRIs have shown posterolaterally within the hemispheres including the periventricular white matter and 2 foci in the cervical spine.  She was initially reluctant to begin a disease modifying therapy as symptoms were mild with just some right -sided numbness, much milder than symptoms that 2 family members with MS have had.  We discussed that the main goal of disease modifying therapy is to prevent or reduce the number of new lesions.  She is concerned about the safety aspects of medications.  We reviewed a couple different options and she was most interested in Copaxone.  I will will have her sign a service request form and we will try to get her initiated on therapy over the next couple weeks.  She will return to see me in 6 months or sooner for new or worsening neurologic symptoms.   Raymonda Pell A. Felecia Shelling, MD, [facility] 07/14/6576, 4:69 PM Certified in Neurology, Clinical Neurophysiology, Sleep Medicine and Neuroimaging  [facility] [address] [place], [place] [address] [phone]

## 2020-05-26 NOTE — Telephone Encounter (Signed)
Faxed completed/signed PA glatiramer to [employer] at [phone]. Received fax confirmation. Waiting on determination.

## 2020-05-27 NOTE — Telephone Encounter (Signed)
[employer]) called, Does not show if PA was denied or approved for Glatiramer.  Contact info: [phone]

## 2020-05-27 NOTE — Telephone Encounter (Signed)
That is because the PA determination is still pending with insurance. We will notify them once we receive a determination

## 2020-05-28 NOTE — Telephone Encounter (Signed)
Pt.'s insurance is [employer]  ID: [id number]  RXBIN: [id number]  RXPCN: [id number]  [id number]: [id number]  Phone # [phone]

## 2020-05-28 NOTE — Telephone Encounter (Signed)
Tried initiating PA with new info on CMM. Key: [id number]. Unable to locate pt.

## 2020-05-28 NOTE — Telephone Encounter (Signed)
Called [facility] [employer] at [phone]. Spoke with [employer]. He transferred me to specialty department. Phone#[phone]. Spoke with Rose. She provided me correct pt ID: [id number]. She was able to start PA on CMM. Key: [id number]. I was able to confirm eligibility. I will proceed with PA on CMM.   I submitted PA. Waiting on determination from [facility] [employer].

## 2020-05-28 NOTE — Telephone Encounter (Addendum)
Called [facility] at [phone]. Spoke with [employer]. States she is unable to locate pt. Fax not received.   I called Pre-auth department at [phone] and spoke with Maudie Mercury. States I have contacted wrong department. Need to call customer service # on back of insurance card (total time of call 31 min).  Called customer service # at [phone] and went to pharmacy option ([facility]). Spoke with [employer]. States pt not found and ended call. I was unable to further inquire. I called number again. Spoke with [employer]. She is unable to locate pt as well. She thinks pt may not have pharmacy coverage. Member should call insurance to verify at [phone].    Called and LVM for pt to call office. Asked she call today if she can since we are closed tomorrow.

## 2020-05-29 NOTE — Telephone Encounter (Signed)
PA approved via [facility] [employer] effective 05/28/2020 - 05/28/2021 PA# [id number].

## 2020-06-01 ENCOUNTER — Ambulatory Visit (INDEPENDENT_AMBULATORY_CARE_PROVIDER_SITE_OTHER): Payer: BC Managed Care – PPO | Admitting: Family Medicine

## 2020-06-01 ENCOUNTER — Encounter: Payer: Self-pay | Admitting: Family Medicine

## 2020-06-01 VITALS — BP 140/95 | HR 78 | Ht 67.0 in | Wt 213.8 lb

## 2020-06-01 DIAGNOSIS — G8929 Other chronic pain: Secondary | ICD-10-CM

## 2020-06-01 DIAGNOSIS — M545 Low back pain, unspecified: Secondary | ICD-10-CM | POA: Diagnosis not present

## 2020-06-01 DIAGNOSIS — G35 Multiple sclerosis: Secondary | ICD-10-CM | POA: Diagnosis not present

## 2020-06-01 DIAGNOSIS — R202 Paresthesia of skin: Secondary | ICD-10-CM

## 2020-06-01 DIAGNOSIS — R200 Anesthesia of skin: Secondary | ICD-10-CM

## 2020-06-01 MED ORDER — MELOXICAM 7.5 MG PO TABS: 7.5000 mg | ORAL_TABLET | Freq: Every day | ORAL | 11 refills | Status: DC

## 2020-06-01 NOTE — Progress Notes (Signed)
I have read the note, and I agree with the clinical assessment and plan.  Tykisha Areola A. Mia Winthrop, MD, PhD, FAAN Certified in Neurology, Clinical Neurophysiology, Sleep Medicine, Pain Medicine and Neuroimaging  [facility] [address], [address] [place], [place] [address] [phone]  

## 2020-06-01 NOTE — Patient Instructions (Signed)
Below is our plan:  We will stop aspirin. I am adding meloxicam 7.5mg  daily. Take once daily for 2 weeks then as needed. Make sure you stay well hydrated and follow up with PCP for any acute needs.   Please make sure you are staying well hydrated. I recommend 50-60 ounces daily. Well balanced diet and regular exercise encouraged.    Please continue follow up with care team as directed.   Follow up with Dr Felecia Shelling in 11/2020 as scheduled.   You may receive a survey regarding today's visit. I encourage you to leave honest feed back as I do use this information to improve patient care. Thank you for seeing me today!     Multiple Sclerosis Multiple sclerosis (MS) is a disease of the brain, spinal cord, and optic nerves (central nervous system). It causes the body's disease-fighting (immune) system to destroy the protective covering (myelin sheath) around nerves in the brain. When this happens, signals (nerve impulses) going to and from the brain and spinal cord do not get sent properly or may not get sent at all. There are several types of MS:  Relapsing-remitting MS. This is the most common type. This causes sudden attacks of symptoms. After an attack, you may recover completely until the next attack, or some symptoms may remain permanently.  Secondary progressive MS. This usually develops after the onset of relapsing-remitting MS. Similar to relapsing-remitting MS, this type also causes sudden attacks of symptoms. Attacks may be less frequent, but symptoms slowly get worse (progress) over time.  Primary progressive MS. This causes symptoms that steadily progress over time. This type of MS does not cause sudden attacks of symptoms. The age of onset of MS varies, but it often develops between 60-46 years of age. MS is a lifelong (chronic) condition. There is no cure, but treatment can help slow down the progression of the disease. What are the causes? The cause of this condition is not  known. What increases the risk? You are more likely to develop this condition if:  You are a woman.  You have a relative with MS. However, the condition is not passed from parent to child (inherited).  You have a lack (deficiency) of vitamin D.  You smoke. MS is more common in the [place] than in the [place]. What are the signs or symptoms? Relapsing-remitting and secondary progressive MS cause symptoms to occur in episodes or attacks that may last weeks to months. There may be long periods between attacks in which there are almost no symptoms. Primary progressive MS causes symptoms to steadily progress after they develop. Symptoms of MS vary because of the many different ways it affects the central nervous system. The main symptoms include:  Vision problems and eye pain.  Numbness and weakness.  Inability to move your arms, hands, feet, or legs (paralysis).  Balance problems.  Shaking that you cannot control (tremors).  Muscle spasms.  Problems with thinking (cognitive changes). MS can also cause symptoms that are associated with the disease, but are not always the direct result of an MS attack. They may include:  Inability to control urination or bowel movements (incontinence).  Headaches.  Fatigue.  Inability to tolerate heat.  Emotional changes.  Depression.  Pain. How is this diagnosed? This condition is diagnosed based on:  Your symptoms.  A neurological exam. This involves checking central nervous system function, such as nerve function, reflexes, and coordination.  MRIs of the brain and spinal cord.  Lab  tests, including a lumbar puncture that tests the fluid that surrounds the brain and spinal cord (cerebrospinal fluid).  Tests to measure the electrical activity of the brain in response to stimulation (evoked potentials). How is this treated? There is no cure for MS, but medicines can help decrease the number and  frequency of attacks and help relieve nuisance symptoms. Treatment options may include:  Medicines that reduce the frequency of attacks. These medicines may be given by injection, by mouth (orally), or through an IV.  Medicines that reduce inflammation (steroids). These may provide short-term relief of symptoms.  Medicines to help control pain, depression, fatigue, or incontinence.  Nutritional counseling. Vitamin D supplements, if you have a deficiency.  Using devices to help you move around (assistive devices), such as braces, a cane, or a walker.  Physical therapy to strengthen and stretch your muscles.  Occupational therapy to help you with everyday tasks.  Alternative or complementary treatments such as exercise, massage, or acupuncture.   Follow these instructions at home:  Take over-the-counter and prescription medicines only as told by your health care provider.  Do not drive or use heavy machinery while taking prescription pain medicine.  Use assistive devices as recommended by your physical therapist or your health care provider.  Exercise as directed by your health care provider.  Eating healthy can help manage MS symptoms.  Return to your normal activities as told by your health care provider. Ask your health care provider what activities are safe for you.  Reach out for support. Share your feelings with friends, family, or a support group.  Keep all follow-up visits as told by your health care provider and therapists. This is important. Where to find more information  National Multiple Sclerosis Society: https://www.nationalmssociety.[place] of Neurological Disorders and Stroke: http://hendricks-barton.net/  [employer] Integrative [employer]: GourmetRating.dk Contact a health care provider if:  You feel depressed.  You develop new pain or numbness.  You have tremors.  You have problems with sexual  function. Get help right away if:  You develop paralysis.  You develop numbness.  You have problems with your bladder or bowel function.  You develop double vision.  You lose vision in one or both eyes.  You develop suicidal thoughts.  You develop severe confusion. If you ever feel like you may hurt yourself or others, or have thoughts about taking your own life, get help right away. You can go to your nearest emergency department or call:  Your local emergency services (911 in the [place]  A suicide crisis helpline, such as the [place] at 503-610-8347. This is open 24 hours a day. Summary  Multiple sclerosis (MS) is a disease of the central nervous system that causes the body's immune system to destroy the protective covering (myelin sheath) around nerves in the brain.  There are 3 types of MS: relapsing-remitting, secondary progressive, and primary progressive. Relapsing-remitting and secondary progressive MS cause symptoms to occur in episodes or attacks that may last weeks to months. Primary progressive MS causes symptoms to steadily progress after they develop.  There is no cure for MS, but medicines can help decrease the number and frequency of attacks and help relieve nuisance symptoms. Treatment may also include physical or occupational therapy.  If you develop numbness, paralysis, vision problems, or other neurological symptoms, get help right away. This information is not intended to replace advice given to you by your health care provider. Make sure you discuss any  questions you have with your health care provider. Document Revised: 02/04/2020 Document Reviewed: 02/04/2020 Elsevier Patient Education  2021 [employer].   Acute Back Pain, Adult Acute back pain is sudden and usually short-lived. It is often caused by an injury to the muscles and tissues in the back. The injury may result from:  A muscle or ligament getting overstretched or  torn (strained). Ligaments are tissues that connect bones to each other. Lifting something improperly can cause a back strain.  Wear and tear (degeneration) of the spinal disks. Spinal disks are circular tissue that provide cushioning between the bones of the spine (vertebrae).  Twisting motions, such as while playing sports or doing yard work.  A hit to the back.  Arthritis. You may have a physical exam, lab tests, and imaging tests to find the cause of your pain. Acute back pain usually goes away with rest and home care. Follow these instructions at home: Managing pain, stiffness, and swelling  Treatment may include medicines for pain and inflammation that are taken by mouth or applied to the skin, prescription pain medicine, or muscle relaxants. Take over-the-counter and prescription medicines only as told by your health care provider.  Your health care provider may recommend applying ice during the first 24-48 hours after your pain starts. To do this: ? Put ice in a plastic bag. ? Place a towel between your skin and the bag. ? Leave the ice on for 20 minutes, 2-3 times a day.  If directed, apply heat to the affected area as often as told by your health care provider. Use the heat source that your health care provider recommends, such as a moist heat pack or a heating pad. ? Place a towel between your skin and the heat source. ? Leave the heat on for 20-30 minutes. ? Remove the heat if your skin turns bright red. This is especially important if you are unable to feel pain, heat, or cold. You have a greater risk of getting burned. Activity  Do not stay in bed. Staying in bed for more than 1-2 days can delay your recovery.  Sit up and stand up straight. Avoid leaning forward when you sit or hunching over when you stand. ? If you work at a desk, sit close to it so you do not need to lean over. Keep your chin tucked in. Keep your neck drawn back, and keep your elbows bent at a 90-degree  angle (right angle). ? Sit high and close to the steering wheel when you drive. Add lower back (lumbar) support to your car seat, if needed.  Take short walks on even surfaces as soon as you are able. Try to increase the length of time you walk each day.  Do not sit, drive, or stand in one place for more than 30 minutes at a time. Sitting or standing for long periods of time can put stress on your back.  Do not drive or use heavy machinery while taking prescription pain medicine.  Use proper lifting techniques. When you bend and lift, use positions that put less stress on your back: ? Firth your knees. ? Keep the load close to your body. ? Avoid twisting.  Exercise regularly as told by your health care provider. Exercising helps your back heal faster and helps prevent back injuries by keeping muscles strong and flexible.  Work with a physical therapist to make a safe exercise program, as recommended by your health care provider. Do any exercises as told by  your physical therapist.   Lifestyle  Maintain a healthy weight. Extra weight puts stress on your back and makes it difficult to have good posture.  Avoid activities or situations that make you feel anxious or stressed. Stress and anxiety increase muscle tension and can make back pain worse. Learn ways to manage anxiety and stress, such as through exercise. General instructions  Sleep on a firm mattress in a comfortable position. Try lying on your side with your knees slightly bent. If you lie on your back, put a pillow under your knees.  Follow your treatment plan as told by your health care provider. This may include: ? Cognitive or behavioral therapy. ? Acupuncture or massage therapy. ? Meditation or yoga. Contact a health care provider if:  You have pain that is not relieved with rest or medicine.  You have increasing pain going down into your legs or buttocks.  Your pain does not improve after 2 weeks.  You have pain at  night.  You lose weight without trying.  You have a fever or chills. Get help right away if:  You develop new bowel or bladder control problems.  You have unusual weakness or numbness in your arms or legs.  You develop nausea or vomiting.  You develop abdominal pain.  You feel faint. Summary  Acute back pain is sudden and usually short-lived.  Use proper lifting techniques. When you bend and lift, use positions that put less stress on your back.  Take over-the-counter and prescription medicines and apply heat or ice as directed by your health care provider. This information is not intended to replace advice given to you by your health care provider. Make sure you discuss any questions you have with your health care provider. Document Revised: 01/17/2020 Document Reviewed: 01/17/2020 Elsevier Patient Education  2021 [employer].

## 2020-06-01 NOTE — Progress Notes (Signed)
Chief Complaint  Patient presents with  . Follow-up    Ms, rm 1, alone Pt states pain in back 9/10     HISTORY OF PRESENT ILLNESS: Today 06/01/20  [name] is a 45 y.o. female here today for follow up for  RRMS. She was seen as a new patient less than 2 weeks ago by Dr Felecia Shelling. He suggested that she consider starting Capaxone. She states that she has not yet received medication. Per notes, approved by [facility] 05/29/20.   She is here today with concerns of low back pain. She reports that she has had waxing and waning low back pain for years. Pain started again over the past 3 days. No radicular symptoms. She describes a soreness/achy pain in center of her back. She continues to have intermittent right sided numbness and tingling of upper and lower extremity that is unchanged from MS diagnosis. No changes in urination. No dysuria. No fever or chills. She takes aspirin 650mg  daily for headaches. No meds for low back pain. She has used hot baths, massage with minimal relief. She does not have PCP.    HISTORY (copied from previous note)  I had the pleasure of seeing your patient, [name], at [facility] for neurologic consultation regarding a recent diagnosis of relapsing remitting multiple sclerosis.  She is a 45 year old woman who was experiencing right-sided paresthesias in the right arm and leg for a few weeks..  Symptoms would come and go.  She had no weakness or gait difficulty.    She presented to the emergency room.  MRI of the brain showed some white matter foci.  This was followed up with an MRI of the cervical spine that showed 2 foci, one centrally at C2-C3 and 1 posteriorly to the right at C5-C6.   She continues to have episodes of random numbness with some tingling.    Sometimes she has symptoms shooting down her spine.     She has had some shooting pain down her neck into her back lasting seconds off/on since her 20's.   She notes a tight  dysesthetic sensation in her upper chest.   She still has the right sided numbness lasting a minute or two about once a day now.     She had Covid diagnosed a few days after her hospitalization 04/2020.     She gets migraine headaches on a near daily basis.    She sometimes takes an NSAID.     MRI of the brain 04/11/2020 showed multiple T2/FLAIR hyperintense foci in the periventricular, juxtacortical and deep white matter.  One focus in the right parietal lobe had subtle enhancement..  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  MRI of the cervical spine 04/13/2020 showed T2 hyperintense foci centrally adjacent to C2-C3, posteriorly to the right adjacent to C5-C6.  He had mild disc bulges and reversal of the cervical curvature.  Vitamin D is low at 13.  ANA, SSA/SSB are normal/negative.  SARS COVID 2 PCR was negative  Her daughter was diagnosed with MS diagnosed in November 2021 (presented with diplopia).  She is on Gilenya.   She also ha a first cousin with MS.      REVIEW OF SYSTEMS: Out of a complete 14 system review of symptoms, the patient complains only of the following symptoms, low back pain, headaches, numbness, tingling and all other reviewed systems are negative.   ALLERGIES: Allergies  Allergen Reactions  .  Pork-Derived Products     Religous beliefs. Ok with lovenox.     HOME MEDICATIONS: Outpatient Medications Prior to Visit  Medication Sig Dispense Refill  . aspirin 325 MG EC tablet Take 650 mg by mouth every 6 (six) hours as needed for pain.    . cholecalciferol (VITAMIN D3) 25 MCG (1000 UNIT) tablet Take 1 tablet (1,000 Units total) by mouth daily. To start after weekly Ergocalciferol is completed 30 tablet 1  . Vitamin D, Ergocalciferol, (DRISDOL) 1.25 MG (50000 UNIT) CAPS capsule Take 1 capsule (50,000 Units total) by mouth every 7 (seven) days. 13 capsule 1  . Glatiramer Acetate 40 MG/ML SOSY Inject into the skin. (Patient not  taking: Reported on 06/01/2020)     No facility-administered medications prior to visit.     PAST MEDICAL HISTORY: Past Medical History:  Diagnosis Date  . BMI 30.0-30.9,adult   . Uterine fibroid      PAST SURGICAL HISTORY: Past Surgical History:  Procedure Laterality Date  . ABDOMINAL HYSTERECTOMY     2010  . CESAREAN SECTION     x 2     FAMILY HISTORY: Family History  Problem Relation Age of Onset  . Cancer Mother   . HIV Father   . Multiple sclerosis Daughter   . Multiple sclerosis Cousin      SOCIAL HISTORY: Social History   Socioeconomic History  . Marital status: Single    Spouse name: Not on file  . Number of children: Not on file  . Years of education: Not on file  . Highest education level: Not on file  Occupational History  . Not on file  Tobacco Use  . Smoking status: Never Smoker  . Smokeless tobacco: Never Used  Vaping Use  . Vaping Use: Never used  Substance and Sexual Activity  . Alcohol use: No  . Drug use: No  . Sexual activity: Not on file  Other Topics Concern  . Not on file  Social History Narrative   Right handed   1 cup coffee per day   Social Determinants of Health   Financial Resource Strain: Not on file  Food Insecurity: Not on file  Transportation Needs: Not on file  Physical Activity: Not on file  Stress: Not on file  Social Connections: Not on file  Intimate Partner Violence: Not on file      PHYSICAL EXAM  Vitals:   06/01/20 0948  BP: (!) 140/95  Pulse: 78  Weight: 213 lb 12.8 oz (97 kg)  Height: 5\' 7"  (1.702 m)   Body mass index is 33.49 kg/m.   Generalized: Well developed, in no acute distress  Cardiology: normal rate and rhythm, no murmur auscultated  Respiratory: clear to auscultation bilaterally    Neurological examination  Mentation: Alert oriented to time, place, history taking. Follows all commands speech and language fluent Cranial nerve II-XII: Pupils were equal round reactive to light.  Extraocular movements were full, visual field were full on confrontational test. Facial sensation and strength were normal. Head turning and shoulder shrug  were normal and symmetric. Motor: The motor testing reveals 5 over 5 strength of all 4 extremities. Good symmetric motor tone is noted throughout.  Sensory: Sensory testing is intact to soft touch on all 4 extremities. No evidence of extinction is noted.  Coordination: Cerebellar testing reveals good finger-nose-finger and heel-to-shin bilaterally.  Gait and station: Gait is normal.  Reflexes: Deep tendon reflexes are symmetric and normal bilaterally.     DIAGNOSTIC DATA (LABS,  IMAGING, TESTING) - I reviewed patient records, labs, notes, testing and imaging myself where available.  Lab Results  Component Value Date   WBC 8.5 04/17/2020   HGB 11.4 (L) 04/17/2020   HCT 38.4 04/17/2020   MCV 72.2 (L) 04/17/2020   PLT 326 04/17/2020      Component Value Date/Time   NA 139 04/17/2020 1119   K 3.3 (L) 04/17/2020 1119   CL 103 04/17/2020 1119   CO2 26 04/17/2020 1119   GLUCOSE 84 04/17/2020 1119   BUN 15 04/17/2020 1119   CREATININE 0.66 04/17/2020 1119   CALCIUM 8.3 (L) 04/17/2020 1119   PROT 6.5 04/17/2020 1119   ALBUMIN 3.3 (L) 04/17/2020 1119   AST 18 04/17/2020 1119   ALT 29 04/17/2020 1119   ALKPHOS 47 04/17/2020 1119   BILITOT 0.3 04/17/2020 1119   GFRNONAA >60 04/17/2020 1119   GFRAA >60 07/13/2017 0339   Lab Results  Component Value Date   CHOL 118 04/11/2020   HDL 37 (L) 04/11/2020   LDLCALC 72 04/11/2020   TRIG 43 04/11/2020   CHOLHDL 3.2 04/11/2020   Lab Results  Component Value Date   HGBA1C 5.9 (H) 04/11/2020   Lab Results  Component Value Date   VITAMINB12 316 04/12/2020   No results found for: TSH  No flowsheet data found.   ASSESSMENT AND PLAN  45 y.o. year old female  has a past medical history of BMI 30.0-30.9,adult and Uterine fibroid. here with   Multiple sclerosis (HCC)  Numbness  and tingling of right arm and leg  Chronic bilateral low back pain without sciatica  Timisha presents today with concerns of low back pain that started 2 to 3 days ago.  She has had chronic low back pain for years.  No new or unusual symptoms.  No changes in MS symptoms.  No dysuria or concerns of infection.  I will have her stop aspirin.  We will start meloxicam 7.5 mg daily.  She is hesitant to take medications every day.  I have suggested that she take it every day for at least 2 weeks then may take as needed for low back pain.  She was instructed to follow-up with primary care if symptoms do not improve in 2 to 3 weeks.  She will call [facility] to ensure Copaxone is being shipped.  I have reviewed red flag warnings with MS and with worsening low back pain.  She has scheduled follow-up with Dr. Felecia Shelling in July.  She will call sooner as needed.  She verbalizes understanding and agreement with this plan.  No orders of the defined types were placed in this encounter.     I spent 20 minutes of face-to-face and non-face-to-face time with patient.  This included previsit chart review, lab review, study review, order entry, electronic health record documentation, patient education.    Debbora Presto, MSN, FNP-C 06/01/2020, 10:34 AM  [facility] [address] [place], [place] [address] [phone]

## 2020-06-01 NOTE — Telephone Encounter (Signed)
Alisha from [employer] called stating that since the medication has been approved she will go ahead with injection training for the pt. To reach [facility] with any questions please call [phone]

## 2020-06-02 ENCOUNTER — Telehealth: Payer: Self-pay | Admitting: *Deleted

## 2020-06-02 NOTE — Telephone Encounter (Signed)
To MR. 

## 2020-06-02 NOTE — Telephone Encounter (Signed)
Signed and returned

## 2020-06-02 NOTE — Telephone Encounter (Signed)
Received notice from [employer] the patient has been enrolled in a co-pay assistance program.  Patient has a $0 co-pay.  Patient's member ID is [id number].  This is effective May 21, 2020.  For questions the number to call is [phone].

## 2020-06-02 NOTE — Telephone Encounter (Addendum)
Received FMLA [employer] for MS form. Competed to AL/NP for review and signature.

## 2020-06-08 NOTE — Telephone Encounter (Signed)
I called patient.  She received her glatiramer injection shipment.  She had her first injection today.  She feels comfortable injecting herself.  She has no questions or concerns at this time.  She will follow-up as scheduled in July and call us for questions or concerns.

## 2020-06-09 NOTE — Telephone Encounter (Signed)
Received fax from [employer] that pt received education training on 06/08/20.

## 2020-08-06 ENCOUNTER — Telehealth (HOSPITAL_COMMUNITY): Payer: Self-pay | Admitting: Psychiatry

## 2020-08-06 NOTE — Telephone Encounter (Signed)
D:  Pt phoned to inquire about MH-IOP; states a friend referred her.  A:  Oriented pt.  Pt will start on 08-12-20.  Encouraged pt to verify her benefits.  R:  Pt receptive.

## 2020-08-11 ENCOUNTER — Other Ambulatory Visit (HOSPITAL_COMMUNITY): Payer: BC Managed Care – PPO | Attending: Psychiatry | Admitting: Psychiatry

## 2020-08-11 ENCOUNTER — Other Ambulatory Visit: Payer: Self-pay

## 2020-08-11 DIAGNOSIS — F4323 Adjustment disorder with mixed anxiety and depressed mood: Secondary | ICD-10-CM | POA: Insufficient documentation

## 2020-08-11 NOTE — Progress Notes (Signed)
Virtual Visit via Video Note  I connected with [name] on @TODAY @ at  9:00 AM EDT by a video enabled telemedicine application and verified that I am speaking with the correct person using two identifiers.  Location: Patient: at home Provider: at office   I discussed the limitations of evaluation and management by telemedicine and the availability of in person appointments. The patient expressed understanding and agreed to proceed.  I discussed the assessment and treatment plan with the patient. The patient was provided an opportunity to ask questions and all were answered. The patient agreed with the plan and demonstrated an understanding of the instructions.   The patient was advised to call back or seek an in-person evaluation if the symptoms worsen or if the condition fails to improve as anticipated.  I provided 60 minutes of non-face-to-face time during this encounter.   [name], M.Ed,CNA   Comprehensive Clinical Assessment (CCA) Note  08/11/2020 [name] [id number]  Chief Complaint:  Chief Complaint  Patient presents with  . Depression  . Anxiety   Visit Diagnosis: F33.1   CCA Screening, Triage and Referral (STR)  Patient Reported Information How did you hear about [place]? Family/Friend  Referral name: Previous pt  Referral phone number: No data recorded  Whom do you see for routine medical problems? Primary Care  Practice/Facility Name: [facility] in [place], [place]  Practice/Facility Phone Number: No data recorded Name of Contact: No data recorded Contact Number: No data recorded Contact Fax Number: No data recorded Prescriber Name: [facility]  Prescriber Address (if known): No data recorded  What Is the Reason for Your Visit/Call Today? worsening depressive/anxiety sx's  How Long Has This Been Causing You Problems? 1-6 months  What Do You Feel Would Help You the Most Today? Stress Management; Treatment for Depression or other mood  problem; Social Support   Have You Recently Been in Any Inpatient Treatment (Hospital/Detox/Crisis Center/28-Day Program)? No  Name/Location of Program/Hospital:No data recorded How Long Were You There? No data recorded When Were You Discharged? No data recorded  Have You Ever Received Services From [facility] Before? No  Who Do You See at [facility]? No data recorded  Have You Recently Had Any Thoughts About Hurting Yourself? No  Are You Planning to Commit Suicide/Harm Yourself At This time? No   Have you Recently Had Thoughts About [place]? No  Explanation: No data recorded  Have You Used Any Alcohol or Drugs in the Past 24 Hours? No  How Long Ago Did You Use Drugs or Alcohol? No data recorded What Did You Use and How Much? No data recorded  Do You Currently Have a Therapist/Psychiatrist? No  Name of Therapist/Psychiatrist: No data recorded  Have You Been Recently Discharged From Any Office Practice or Programs? No  Explanation of Discharge From Practice/Program: No data recorded    CCA Screening Triage Referral Assessment Type of Contact: No data recorded Is this Initial or Reassessment? No data recorded Date Telepsych consult ordered in [facility]:  No data recorded Time Telepsych consult ordered in [facility]:  No data recorded  Patient Reported Information Reviewed? No data recorded Patient Left Without Being Seen? No data recorded Reason for Not Completing Assessment: No data recorded  Collateral Involvement: No data recorded  Does Patient Have a [place]? No data recorded Name and Contact of Legal Guardian: No data recorded If Minor and Not Living with Parent(s), Who has Custody? No data recorded Is CPS involved  or ever been involved? Never  Is APS involved or ever been involved? Never   Patient Determined To Be At Risk for Harm To Self or Others Based on Review of Patient Reported Information or Presenting Complaint? No data  recorded Method: No data recorded Availability of Means: No data recorded Intent: No data recorded Notification Required: No data recorded Additional Information for Danger to Others Potential: No data recorded Additional Comments for Danger to Others Potential: No data recorded Are There Guns or Other Weapons in Your Home? No data recorded Types of Guns/Weapons: No data recorded Are These Weapons Safely Secured?                            No data recorded Who Could Verify You Are Able To Have These Secured: No data recorded Do You Have any Outstanding Charges, Pending Court Dates, Parole/Probation? No data recorded Contacted To Inform of Risk of Harm To Self or Others: No data recorded  Location of Assessment: No data recorded  Does Patient Present under Involuntary Commitment? No  IVC Papers Initial File Date: No data recorded  [place] of Residence: [place]   Patient Currently Receiving the Following Services: IOP (Intensive Outpatient Program)   Determination of Need: Routine (7 days)   Options For Referral: Intensive Outpatient Therapy     CCA Biopsychosocial Intake/Chief Complaint:  This is a 45 yr old engaged, employed, [place] American female who was referred per a previous pt; d/t worsening depressive and anxiety symptoms.  Denies SI/HI or A/V hallucinations.  Reports struggling with symptoms for 3-4 months before they started worsening recently.  Triggers/Stressors:  1) In Sept 2021 pt's 41 yr old daughter was dx'd with MS.  During that time, according to pt, her whold household had COVID-19.  2) Medical Issue:  In Nov. 2021, pt was dx'd with MS.  3) Job ([employer]) of three yrs.  According to pt, she hasn't been able to meet her quota d/t unrealistic expectations.  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  4) No support system since her mother passed in 2017.  Pt denies any prior psychiatric hospitalizations or suicide attempts/gestures.  States she hasn't ever seen any  behavioral health providers before.  Family hx:  Father and P-Uncles (Drugs/ETOH)  Current Symptoms/Problems: Panic attacks (2-3 X per week:  at night), sadness, increased anxiety, decreased appetite (lost 30 lbs within one month), flucuating sleep (difficult to get back to sleep), poor energy, decreased concentration, irritable, c/o hair falling out, anhedonia, isolative   Patient Reported Schizophrenia/Schizoaffective Diagnosis in Past: No   Strengths: "I am a leader; I get things done."  Preferences: "I need to work on showing my emotions."  Abilities: Open to things   Type of Services Patient Feels are Needed: MH-IOP   Initial Clinical Notes/Concerns: No data recorded  Mental Health Symptoms Depression:  Change in energy/activity; Difficulty Concentrating; Fatigue; Increase/decrease in appetite; Irritability; Sleep (too much or little); Weight gain/loss   Duration of Depressive symptoms: Greater than two weeks   Mania:  N/A   Anxiety:   Worrying   Psychosis:  None   Duration of Psychotic symptoms: No data recorded  Trauma:  N/A   Obsessions:  N/A   Compulsions:  N/A   Inattention:  N/A   Hyperactivity/Impulsivity:  N/A   Oppositional/Defiant Behaviors:  N/A   Emotional Irregularity:  N/A   Other Mood/Personality Symptoms:  No data recorded   Mental Status Exam Appearance and  self-care  Stature:  Average   Weight:  Average weight   Clothing:  Casual   Grooming:  Normal   Cosmetic use:  No data recorded  Posture/gait:  Normal   Motor activity:  Not Remarkable   Sensorium  Attention:  Normal   Concentration:  Normal   Orientation:  X5   Recall/memory:  Normal   Affect and Mood  Affect:  Anxious   Mood:  Depressed   Relating  Eye contact:  Normal   Facial expression:  Responsive   Attitude toward examiner:  Cooperative   Thought and Language  Speech flow: Normal   Thought content:  Appropriate to Mood and Circumstances    Preoccupation:  None   Hallucinations:  None   Organization:  No data recorded  [employer] of Knowledge:  Average   Intelligence:  Average   Abstraction:  Normal   Judgement:  Good   Reality Testing:  Adequate   Insight:  Good   Decision Making:  No data recorded  Social Functioning  Social Maturity:  Isolates   Social Judgement:  Normal   Stress  Stressors:  Work; Brewing technologist; Other (Comment) (She and daughter being dx'd with MS)   Coping Ability:  Overwhelmed   Skill Deficits:  Decision making; Activities of daily living; Self-care   Supports:  Support needed     Religion: Religion/Spirituality Are You A Religious Person?: Yes What is Your Religious Affiliation?: Muslim How Might This Affect Treatment?: "I'm very religious"  Leisure/Recreation: Leisure / Recreation Do You Have Hobbies?: Yes Leisure and Hobbies: cooking  Exercise/Diet: Exercise/Diet Do You Exercise?: No Have You Gained or Lost A Significant Amount of Weight in the Past Six Months?: Yes-Lost Number of Pounds Lost?: 30 Do You Follow a Special Diet?: No Do You Have Any Trouble Sleeping?: Yes Explanation of Sleeping Difficulties: flucuating sleep; hard to get back to sleep when awaken   CCA Employment/Education Employment/Work Situation: Employment / Work Situation Employment situation: Employed Where is patient currently employed?: [employer] How long has patient been employed?: 3 yrs Patient's job has been impacted by current illness: Yes Describe how patient's job has been impacted: Not able to function or meet quota What is the longest time patient has a held a job?: 8 yrs Where was the patient employed at that time?: [place] Has patient ever been in the [employer]?: No  Education: Education Is Patient Currently Attending School?: No Did Teacher, adult education From [employer]?: No Did You Nutritional therapist?: Yes What Type of College Degree Do you Have?: some  college Did Heritage manager?: No Did You Have An Individualized Education Program (IIEP): No Did You Have Any Difficulty At [employer]?: No Patient's Education Has Been Impacted by Current Illness: No   CCA Family/Childhood History Family and Relationship History: Family history Marital status: Single (engaged) What is your sexual orientation?: heterosexual Does patient have children?: Yes How many children?: 5 How is patient's relationship with their children?: Ages:  28, 50, 38 yr old daughters and 19, 51 yr old sons  Childhood History:  Childhood History By whom was/is the patient raised?: Both parents Additional childhood history information: Born in [place], [place].  Raised by both parents on and off.  Denies any trauma/abuse.  Denies any issues in school.  Pt wasn't expansive about her childhood. Does patient have siblings?: Yes Number of Siblings: 6 Description of patient's current relationship with siblings: 3 sisters and 3 brothers (pt is 2nd to the youngest) Did  patient suffer any verbal/emotional/physical/sexual abuse as a child?: No Did patient suffer from severe childhood neglect?: No Has patient ever been sexually abused/assaulted/raped as an adolescent or adult?: No Was the patient ever a victim of a crime or a disaster?: No Witnessed domestic violence?: No Has patient been affected by domestic violence as an adult?: No  Child/Adolescent Assessment:     CCA Substance Use Alcohol/Drug Use: Alcohol / Drug Use Pain Medications: cc: MAR Prescriptions: cc: MAR Over the Counter: cc:  MAR History of alcohol / drug use?: No history of alcohol / drug abuse                         ASAM's:  Six Dimensions of Multidimensional Assessment  Dimension 1:  Acute Intoxication and/or Withdrawal Potential:      Dimension 2:  Biomedical Conditions and Complications:      Dimension 3:  Emotional, Behavioral, or Cognitive Conditions and Complications:      Dimension 4:  Readiness to Change:     Dimension 5:  Relapse, Continued use, or Continued Problem Potential:     Dimension 6:  Recovery/Living Environment:     ASAM Severity Score:    ASAM Recommended Level of Treatment:     Substance use Disorder (SUD)    Recommendations for Services/Supports/Treatments: Recommendations for Services/Supports/Treatments Recommendations For Services/Supports/Treatments: IOP (Intensive Outpatient Program)  DSM5 Diagnoses: Patient Active Problem List   Diagnosis Date Noted  . BMI 30.0-30.9,adult   . Numbness and tingling of right arm and leg 04/12/2020  . Multiple sclerosis ([place]) 04/11/2020  . Viral URI with cough 06/06/2018    Patient Centered Plan: Patient is on the following Treatment Plan(s):  Anxiety and Depression Pt will improve her mood as evidenced by being happy again, managing her mood and coping with daily stressors better for 5 out of the 7 days for 60 days. Oriented pt to virtual MH-IOP.  Pt gave verbal consent for treatment, to release chart information to referred providers and to complete any forms if needed.  Pt also gave consent for attending group virtually d/t COVID-19 social distancing restrictions.  Encouraged support groups.  Will refer pt to a psychiatrist and a therapist.  Referrals to Alternative Service(s): Referred to Alternative Service(s):   Place:   Date:   Time:    Referred to Alternative Service(s):   Place:   Date:   Time:    Referred to Alternative Service(s):   Place:   Date:   Time:    Referred to Alternative Service(s):   Place:   Date:   Time:     [name], M.Ed,CNA

## 2020-08-12 ENCOUNTER — Other Ambulatory Visit (HOSPITAL_COMMUNITY): Payer: BC Managed Care – PPO | Admitting: Family

## 2020-08-12 ENCOUNTER — Encounter (HOSPITAL_COMMUNITY): Payer: Self-pay

## 2020-08-12 ENCOUNTER — Other Ambulatory Visit: Payer: Self-pay

## 2020-08-12 DIAGNOSIS — F4323 Adjustment disorder with mixed anxiety and depressed mood: Secondary | ICD-10-CM

## 2020-08-12 NOTE — Progress Notes (Signed)
Virtual Visit via Video Note  I connected with [name] on 08/12/20 at  9:00 AM EDT by a video enabled telemedicine application and verified that I am speaking with the correct person using two identifiers.  At orientation to the IOP program, Case Manager discussed the limitations of evaluation and management by telemedicine and the availability of in person appointments. The patient expressed understanding and agreed to proceed with virtual visits throughout the duration of the program.   Location:  Patient: Patient Home Provider: Home Office   History of Present Illness: Adjustment DO, mixed   Observations/Objective: Check In: Case Manager checked in with all participants to review discharge dates, insurance authorizations, work-related documents and needs from the treatment team regarding medications. Client stated needs and engaged in discussion. Case Manager introduced the Client to the group, with group members starting the joining process.   Initial Therapeutic Activity: Counselor introduced our guest speaker, Einar Grad, [facility] Pharmacist, who shared about psychiatric medications, side effects, treatment considerations and how to communicate with medical professionals. Group Members asked questions and shared medication concerns. Counselor prompted group members to reference a worksheet called, "Body Scan" to jot down questions and concerns about their physical health in preparation for their upcoming appointments with medical professionals. Client noted issues with recen diagnosis and finding appropriate treatment. Counselor encouraged routine medical check-ups, preparing for appointments, following up with recommendations and seeking specialist if needed.   Second Therapeutic Activity: Counselor facilitated therapeutic processing with group members to assess mood and current functioning, prompting group members to share about application of skills, progress and challenges in  treatment/personal lives. Topics covered: panic attacks, coping skills, grief work, childhood traumas, and medication management. Client engaged in discussions and shared application of skills. Client presents with moderate depression and moderate anxiety. Client denied any current SI/HI/psychosis.   Check Out: Counselor closed program by allowing time to celebrate a graduating group member. Counselor shared reflections on progress and allow space for group members to share well wishes and appreciates to the graduating client. Counselor prompted graduating client to share takeaways, reflect on progress and final thoughts for the group. Counselor prompted group members to choose a relaxation or self-care activity for the afternoon, as we covered heavy emotional topics in group. Counselor promoted self-compassion. Client endorsed safety plan to be followed to prevent safety issues.   Assessment and Plan: Clinician recommends that Client remain in IOP treatment to better manage mental health symptoms, stabilization and to address treatment plan goals. Clinician recommends adherence to crisis/safety plan, taking medications as prescribed, and following up with medical professionals if any issues arise.   Follow Up Instructions: Clinician will send Webex link for next session. The Client was advised to call back or seek an in-person evaluation if the symptoms worsen or if the condition fails to improve as anticipated.     I provided 180 minutes of non-face-to-face time during this encounter.     Lise Auer, LCSW
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
## 2020-08-13 ENCOUNTER — Other Ambulatory Visit (HOSPITAL_COMMUNITY): Payer: BC Managed Care – PPO | Admitting: Psychiatry

## 2020-08-13 ENCOUNTER — Encounter (HOSPITAL_COMMUNITY): Payer: Self-pay

## 2020-08-13 ENCOUNTER — Other Ambulatory Visit: Payer: Self-pay

## 2020-08-13 DIAGNOSIS — F4323 Adjustment disorder with mixed anxiety and depressed mood: Secondary | ICD-10-CM

## 2020-08-13 NOTE — Progress Notes (Signed)
Virtual Visit via Video Note  I connected with [name] on 08/13/20 at  9:00 AM EDT by a video enabled telemedicine application and verified that I am speaking with the correct person using two identifiers.  At orientation to the IOP program, Case Manager discussed the limitations of evaluation and management by telemedicine and the availability of in person appointments. The patient expressed understanding and agreed to proceed with virtual visits throughout the duration of the program.   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 Client stated needs and engaged in discussion.   Initial Therapeutic Activity: Counselor facilitated therapeutic processing with group members to assess mood and current functioning, prompting group members to share about application of skills, progress and challenges in treatment/personal lives. Topics covered: work-stress solutions, sleep hygiene, social connections and following up on homework to address medical issues. Client presents with moderate depression and moderate anxiety. Client denied any current SI/HI/psychosis.   Second Therapeutic Activity: Counselor engaged group in discussion on self-compassion discussion meaning and current practices. Counselor prompted group members to compete a self-compassion assessment. Group members shared they scores and indications. Counselor provided group with tools, resources and practices to improve self-compassion. Counselor facilitated a Web designer, then group shared their preferences and experiences. Client engaged well in discussion and activities.    Check Out: Counselor closed program by allowing time to celebrate  a graduating group member. Counselor shared reflections on progress and allow space for group members to share well wishes and appreciates to the graduating client. Counselor prompted graduating client to share takeaways, reflect on progress and final thoughts for the group. Counselor prompted group members to choose a relaxation or self-care activity for the afternoon, as we covered heavy emotional topics in group. Counselor promoted self-compassion. Client endorsed safety plan to be followed to prevent safety issues.   Assessment and Plan: Clinician recommends that Client remain in IOP treatment to better manage mental health symptoms, stabilization and to address treatment plan goals. Clinician recommends adherence to crisis/safety plan, taking medications as prescribed, and following up with medical professionals if any issues arise.   Follow Up Instructions: Clinician will send Webex link for next session. The Client was advised to call back or seek an in-person evaluation if the symptoms worsen or if the condition fails to improve as anticipated.     I provided 180 minutes of non-face-to-face time during this encounter.     Lise Auer, LCSW

## 2020-08-14 ENCOUNTER — Other Ambulatory Visit (HOSPITAL_COMMUNITY): Payer: BC Managed Care – PPO | Admitting: Psychiatry

## 2020-08-14 ENCOUNTER — Encounter (HOSPITAL_COMMUNITY): Payer: Self-pay

## 2020-08-14 ENCOUNTER — Other Ambulatory Visit: Payer: Self-pay

## 2020-08-14 DIAGNOSIS — F4323 Adjustment disorder with mixed anxiety and depressed mood: Secondary | ICD-10-CM

## 2020-08-14 NOTE — Progress Notes (Signed)
Virtual Visit via Video Note  I connected with [name] on 08/14/20 at  9:00 AM EDT by a video enabled telemedicine application and verified that I am speaking with the correct person using two identifiers.  At orientation to the IOP program, Case Manager discussed the limitations of evaluation and management by telemedicine and the availability of in person appointments. The patient expressed understanding and agreed to proceed with virtual visits throughout the duration of the program.   Location:  Patient: Patient Home Provider: Home Office   History of Present Illness: Adjustment DO, mixed mood  Observations/Objective: Check In: Case Manager checked in with all participants to review discharge dates, insurance authorizations, work-related documents and needs from the treatment team regarding medications. Client stated needs and engaged in discussion.   Initial Therapeutic Activity: Counselor facilitated therapeutic processing with group members to assess mood and current functioning, prompting group members to share about application of skills, progress and challenges in treatment/personal lives. Topics covered: parenting challenges, communicating needs, work-related stress, sleep hygiene, and generational patterns. Client engaged in discussions and shared application of skills. Client presents with moderate depression and moderate anxiety. Client denied any current SI/HI/psychosis.   Second Therapeutic Activity: Counselor engaged group in discussion about self-care. Counselor shared 2 handouts with self-care wheels to explore where Client's can improve their self-care practices. Client identified specific self-care practices and skills she would like to implement in her daily and weekly life. Client stated that she rarely makes time for self and would like to structure more into her schedule, as she sees the importance and benefits.    Check Out: Counselor prompted group members to  choose a relaxation or self-care activity for the weekend. Client plans to engage in faith events. Counselor promoted self-compassion. Client endorsed safety plan to be followed to prevent safety issues.   Assessment and Plan: Clinician recommends that Client remain in IOP treatment to better manage mental health symptoms, stabilization and to address treatment plan goals. Clinician recommends adherence to crisis/safety plan, taking medications as prescribed, and following up with medical professionals if any issues arise.   Follow Up Instructions: Clinician will send Webex link for next session. The Client was advised to call back or seek an in-person evaluation if the symptoms worsen or if the condition fails to improve as anticipated.     I provided 180 minutes of non-face-to-face time during this encounter.     Lise Auer, LCSW

## 2020-08-17 ENCOUNTER — Other Ambulatory Visit: Payer: Self-pay

## 2020-08-17 ENCOUNTER — Other Ambulatory Visit (HOSPITAL_COMMUNITY): Payer: BC Managed Care – PPO | Admitting: Psychiatry

## 2020-08-17 ENCOUNTER — Encounter (HOSPITAL_COMMUNITY): Payer: Self-pay

## 2020-08-17 DIAGNOSIS — F4323 Adjustment disorder with mixed anxiety and depressed mood: Secondary | ICD-10-CM

## 2020-08-17 NOTE — Progress Notes (Signed)
Virtual Visit via Video Note  I connected with [name] on 08/17/20 at  9:00 AM EDT by a video enabled telemedicine application and verified that I am speaking with the correct person using two identifiers.  At orientation to the IOP program, Case Manager discussed the limitations of evaluation and management by telemedicine and the availability of in person appointments. The patient expressed understanding and agreed to proceed with virtual visits throughout the duration of the program.   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 Client stated needs and engaged in discussion.   Initial Therapeutic Activity: Counselor facilitated therapeutic processing with group members to assess mood and current functioning, prompting group members to share about application of skills, progress and challenges in treatment/personal lives. Topics covered: grief and loss, financial stressors, medication side effects, communication with supports and family stressors. Client presents with moderate depression and moderate anxiety. Client denied any current SI/HI/psychosis.   Second Therapeutic Activity: Counselor provided group with an exhaustive list of community, state and national mental health, substance abuse and basic need resources. Counselor prompted group members to save numbers in their phones and to bookmark links for future use.   Check Out: Counselor prompted group members to choose a relaxation or self-care activity for the weekend. Counselor promoted self-compassion. Client endorsed safety plan to be followed to prevent safety issues.   Assessment and Plan: Clinician recommends that Client remain in IOP treatment to better  manage mental health symptoms, stabilization and to address treatment plan goals. Clinician recommends adherence to crisis/safety plan, taking medications as prescribed, and following up with medical professionals if any issues arise.   Follow Up Instructions: Clinician will send Webex link for next session. The Client was advised to call back or seek an in-person evaluation if the symptoms worsen or if the condition fails to improve as anticipated.     I provided 180 minutes of non-face-to-face time during this encounter.     Lise Auer, LCSW

## 2020-08-18 ENCOUNTER — Other Ambulatory Visit (HOSPITAL_COMMUNITY): Payer: BC Managed Care – PPO | Admitting: Psychiatry

## 2020-08-18 ENCOUNTER — Other Ambulatory Visit: Payer: Self-pay

## 2020-08-18 DIAGNOSIS — F4323 Adjustment disorder with mixed anxiety and depressed mood: Secondary | ICD-10-CM | POA: Diagnosis not present

## 2020-08-19 ENCOUNTER — Encounter (HOSPITAL_COMMUNITY): Payer: Self-pay | Admitting: Psychiatry

## 2020-08-19 ENCOUNTER — Other Ambulatory Visit (HOSPITAL_COMMUNITY): Payer: BC Managed Care – PPO | Admitting: Psychiatry

## 2020-08-19 ENCOUNTER — Other Ambulatory Visit: Payer: Self-pay

## 2020-08-19 DIAGNOSIS — F4323 Adjustment disorder with mixed anxiety and depressed mood: Secondary | ICD-10-CM

## 2020-08-19 NOTE — Progress Notes (Signed)
Virtual Visit via Video Note  I connected with [name] on 08/19/20 at  9:00 AM EDT by a video enabled telemedicine application and verified that I am speaking with the correct person using two identifiers.  At orientation to the IOP program, Case Manager discussed the limitations of evaluation and management by telemedicine and the availability of in person appointments. The patient expressed understanding and agreed to proceed with virtual visits throughout the duration of the program.   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 Client stated needs and engaged in discussion. Case Manager introduced new client to the group, as group members welcomed and started joining process.  Initial Therapeutic Activity: Counselor facilitated therapeutic processing with group members to assess mood and current functioning, prompting group members to share about application of skills, progress and challenges in treatment/personal lives. Topics covered: how to organize environment, medication side effects, grief reminders/work, memory loss and challenges, and managing mental health as a partner. Client denied any current SI/HI/psychosis.   Second Therapeutic Activity: Counselor provided psychoeducation on goal writing and creation. Counselor used the S.M.A.R.T. Goals format, sharing a video and article with the group. Counselor allowed time for group members to generate their own S.M.A.R.T. Goals and ask questions about the process. Counselor prompted each group member to share goals aloud and received feedback from the group. Goals were focused around personal, health and relational aspirations. Client engaged well in the activity.     Check Out: Counselor prompted group members to choose a productivity activity or self-care practice to engage in today. Client endorsed safety plan to be followed to prevent safety issues.   Assessment and Plan: Clinician recommends that Client remain in IOP treatment to better manage mental health symptoms, stabilization and to address treatment plan goals. Clinician recommends adherence to crisis/safety plan, taking medications as prescribed, and following up with medical professionals if any issues arise.   Follow Up Instructions: Clinician will send Webex link for next session. The Client was advised to call back or seek an in-person evaluation if the symptoms worsen or if the condition fails to improve as anticipated.     I provided 180 minutes of non-face-to-face time during this encounter.     Lise Auer, LCSW

## 2020-08-20 ENCOUNTER — Encounter (HOSPITAL_COMMUNITY): Payer: Self-pay

## 2020-08-20 ENCOUNTER — Other Ambulatory Visit: Payer: Self-pay

## 2020-08-20 ENCOUNTER — Other Ambulatory Visit (HOSPITAL_COMMUNITY): Payer: BC Managed Care – PPO | Admitting: Psychiatry

## 2020-08-20 DIAGNOSIS — F4323 Adjustment disorder with mixed anxiety and depressed mood: Secondary | ICD-10-CM

## 2020-08-20 NOTE — Progress Notes (Signed)
Virtual Visit via Video Note  I connected with [name] on 08/20/2020 at  9:00 AM EDT by a video enabled telemedicine application and verified that I am speaking with the correct person using two identifiers.  At orientation to the IOP program, Case Manager discussed the limitations of evaluation and management by telemedicine and the availability of in person appointments. The patient expressed understanding and agreed to proceed with virtual visits throughout the duration of the program.   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 Client stated needs and engaged in discussion. Client denied any current SI/HI/psychosis.  Initial Therapeutic Activity: Counselor introduced [employer], [place] Chaplain to present information and discussion on Grief and Loss. Group members engaged in discussion, sharing how grief impacts them, what comforts them, what emotions are felt, labeling losses, etc. After guest speaker logged off, Counselor prompted group to spend 10-15 minutes journaling to process personal grief and loss situations. Counselor processed entries with group and client's identified areas for additional processing in individual therapy.    Second Therapeutic Activity: Counselor introduced [employer], representative with [place] to share about programming. Group Members asked questions and engaged in discussion, as Cristie Hem shared about Peer Support, Support Groups and the [employer]. Client stated that they are interested in connecting with the [facility] Support Group.   Check Out: Counselor prompted group members to choose a productivity activity or self-care practice to engage in  today. Client plans to clean her room. Client endorsed safety plan to be followed to prevent safety issues.   Assessment and Plan: Clinician recommends that Client remain in IOP treatment to better manage mental health symptoms, stabilization and to address treatment plan goals. Clinician recommends adherence to crisis/safety plan, taking medications as prescribed, and following up with medical professionals if any issues arise.   Follow Up Instructions: Clinician will send Webex link for next session. The Client was advised to call back or seek an in-person evaluation if the symptoms worsen or if the condition fails to improve as anticipated.     I provided 180 minutes of non-face-to-face time during this encounter.     Lise Auer, LCSW

## 2020-08-20 NOTE — Progress Notes (Signed)
Virtual Visit via Video Note  I connected with [name] on 08/20/20 at  9:00 AM EDT by a video enabled telemedicine application and verified that I am speaking with the correct person using two identifiers.  At orientation to the IOP program, Case Manager discussed the limitations of evaluation and management by telemedicine and the availability of in person appointments. The patient expressed understanding and agreed to proceed with virtual visits throughout the duration of the program.   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 Client stated needs and engaged in discussion.   Initial Therapeutic Activity: Counselor facilitated therapeutic processing with group members to assess mood and current functioning, prompting group members to share about application of skills, progress and challenges in treatment/personal lives. Topics covered: work related stress, impact of racism on [place], financial health, view on [place] labels/diagnosis and goal setting. Client denied any current SI/HI/psychosis.   Second Therapeutic Activity: Counselor provided psychoeducation on setting intentions and creating a Life Purpose Statement. Counselor shared 2 articles and several examples for context and application. Counselor allowed time for group members to generate their own statements and intentions and ask questions about the process. Counselor prompted each group member to share what they developed aloud and received feedback from the group. Goals were focused around personal, financial, health and relational aspirations. Client engaged well in the activity.    Check Out: Counselor prompted group members to choose a  productivity activity or self-care practice to engage in today. Client plans to clean one room in her home, start to finish. Client endorsed safety plan to be followed to prevent safety issues.   Assessment and Plan: Clinician recommends that Client remain in IOP treatment to better manage mental health symptoms, stabilization and to address treatment plan goals. Clinician recommends adherence to crisis/safety plan, taking medications as prescribed, and following up with medical professionals if any issues arise.   Follow Up Instructions: Clinician will send Webex link for next session. The Client was advised to call back or seek an in-person evaluation if the symptoms worsen or if the condition fails to improve as anticipated.     I provided 180 minutes of non-face-to-face time during this encounter.     Lise Auer, LCSW

## 2020-08-21 ENCOUNTER — Other Ambulatory Visit (HOSPITAL_COMMUNITY): Payer: BC Managed Care – PPO | Admitting: Psychiatry

## 2020-08-21 ENCOUNTER — Encounter (HOSPITAL_COMMUNITY): Payer: Self-pay

## 2020-08-21 ENCOUNTER — Other Ambulatory Visit: Payer: Self-pay

## 2020-08-21 DIAGNOSIS — F4323 Adjustment disorder with mixed anxiety and depressed mood: Secondary | ICD-10-CM

## 2020-08-21 NOTE — Progress Notes (Signed)
Virtual Visit via Video Note  I connected with [name] on 08/21/20 at  9:00 AM EDT by a video enabled telemedicine application and verified that I am speaking with the correct person using two identifiers.  At orientation to the IOP program, Case Manager discussed the limitations of evaluation and management by telemedicine and the availability of in person appointments. The patient expressed understanding and agreed to proceed with virtual visits throughout the duration of the program.   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 Client stated needs and engaged in discussion. Case Manager reminded of contact and crisis numbers, and plan for program coverage next week. Case Manager introduced a new Client to the group, with group members welcoming and starting the joining process.   Initial Therapeutic Activity: Counselor facilitated a check-in with group members to assess mood and current functioning. Counselor facilitated group discussion on following topics, sharing coping skills and strategies to address: relational challenges, boundary setting, assertive communication, post-partum depression, social anxiety, familial and generational patterns. Client presents with moderate depression and moderate anxiety. Client engaged in discussion and provided feedback for other group members. Client denied any current SI/HI/psychosis.   Second Therapeutic Activity: Counselor prompted group members to share needs and recommendations for topics to cover in upcoming sessions to best address current mental health challenges. Group Members each shared their ideas, including time management, discharge planning, coping skills, managing  work stress, vulnerability, authenticity, and grief and loss. Client engaged well in discussion.    Check Out: Counselor prompted group members to choose a productivity activity or self-care practice to engage in over the weekend. Client plans to spend time in the community observing a religious holiday. Client endorsed safety plan to be followed to prevent safety issues.   Assessment and Plan: Clinician recommends that Client remain in IOP treatment to better manage mental health symptoms, stabilization and to address treatment plan goals. Clinician recommends adherence to crisis/safety plan, taking medications as prescribed, and following up with medical professionals if any issues arise.   Follow Up Instructions: Clinician will send Webex link for next session. The Client was advised to call back or seek an in-person evaluation if the symptoms worsen or if the condition fails to improve as anticipated.     I provided 180 minutes of non-face-to-face time during this encounter.     Lise Auer, LCSW

## 2020-08-24 ENCOUNTER — Other Ambulatory Visit: Payer: Self-pay

## 2020-08-24 ENCOUNTER — Other Ambulatory Visit (HOSPITAL_COMMUNITY): Payer: BC Managed Care – PPO | Admitting: Licensed Clinical Social Worker

## 2020-08-24 DIAGNOSIS — F4323 Adjustment disorder with mixed anxiety and depressed mood: Secondary | ICD-10-CM | POA: Diagnosis not present

## 2020-08-25 ENCOUNTER — Other Ambulatory Visit (HOSPITAL_COMMUNITY): Payer: BC Managed Care – PPO | Admitting: Licensed Clinical Social Worker

## 2020-08-25 ENCOUNTER — Other Ambulatory Visit: Payer: Self-pay

## 2020-08-25 DIAGNOSIS — F4323 Adjustment disorder with mixed anxiety and depressed mood: Secondary | ICD-10-CM | POA: Diagnosis not present

## 2020-08-26 ENCOUNTER — Other Ambulatory Visit: Payer: Self-pay

## 2020-08-26 ENCOUNTER — Other Ambulatory Visit (HOSPITAL_COMMUNITY): Payer: BC Managed Care – PPO | Admitting: Licensed Clinical Social Worker

## 2020-08-26 DIAGNOSIS — F4323 Adjustment disorder with mixed anxiety and depressed mood: Secondary | ICD-10-CM

## 2020-08-27 ENCOUNTER — Other Ambulatory Visit (HOSPITAL_COMMUNITY): Payer: BC Managed Care – PPO | Admitting: Licensed Clinical Social Worker

## 2020-08-27 ENCOUNTER — Other Ambulatory Visit: Payer: Self-pay

## 2020-08-27 DIAGNOSIS — F4323 Adjustment disorder with mixed anxiety and depressed mood: Secondary | ICD-10-CM | POA: Diagnosis not present

## 2020-08-28 ENCOUNTER — Other Ambulatory Visit (HOSPITAL_COMMUNITY): Payer: BC Managed Care – PPO | Admitting: Licensed Clinical Social Worker

## 2020-08-28 ENCOUNTER — Other Ambulatory Visit: Payer: Self-pay

## 2020-08-28 DIAGNOSIS — F4323 Adjustment disorder with mixed anxiety and depressed mood: Secondary | ICD-10-CM | POA: Diagnosis not present

## 2020-08-31 ENCOUNTER — Other Ambulatory Visit (HOSPITAL_COMMUNITY): Payer: BC Managed Care – PPO | Admitting: Psychiatry

## 2020-08-31 ENCOUNTER — Other Ambulatory Visit: Payer: Self-pay

## 2020-08-31 ENCOUNTER — Encounter (HOSPITAL_COMMUNITY): Payer: Self-pay | Admitting: Psychiatry

## 2020-08-31 DIAGNOSIS — F4323 Adjustment disorder with mixed anxiety and depressed mood: Secondary | ICD-10-CM

## 2020-08-31 NOTE — Progress Notes (Signed)
Virtual Visit via Video Note  I connected with [name] on 08/31/20 at  9:00 AM EDT by a video enabled telemedicine application and verified that I am speaking with the correct person using two identifiers.  At orientation to the IOP program, Case Manager discussed the limitations of evaluation and management by telemedicine and the availability of in person appointments. The patient expressed understanding and agreed to proceed with virtual visits throughout the duration of the program.   Location:  Patient: Patient Home Provider: Home Office   History of Present Illness: Adjustment DO with Mixed Anxiety  Observations/Objective: Check In: Case Manager checked in with all participants to review discharge dates, insurance authorizations, work-related documents and needs from the treatment team regarding medications. Client stated needs and engaged in discussion. Case Manager reminded of contact and crisis numbers, and plan for program coverage next week. Case Manager introduced a new Client to the group, with group members welcoming and starting the joining process.   Initial Therapeutic Activity: Counselor facilitated a check-in with group members to assess mood and current functioning. Counselor facilitated group discussion on following topics, sharing coping skills and strategies to address: discharge preparedness, grief and loss, overspending, and engaging in hobbies/interests. Client engaged in discussion and provided feedback for other group members. Client denied any current SI/HI/psychosis.   Second Therapeutic Activity: Counselor provided psychoeducation on the 5 Love Languages and the importance of how we receive and give love to others. Counselor shared the link for group members to take the love language quiz. Group members took the assessment and shared their results. Client identified Quality Time as their love language. Counselor discussed application of aspects of the 5 Love  languages and encouraged group members to complete assessments, utilize resources with partners. Client engaged well in discussion.    Check Out: Counselor closed program by allowing time to celebrate a graduating group member. Counselor shared reflections on progress and allow space for group members to share well wishes and appreciates to the graduating client. Counselor prompted graduating client to share takeaways, reflect on progress and final thoughts for the group. Client endorsed safety plan to be followed to prevent safety issues.   Assessment and Plan: Clinician recommends that Client remain in IOP treatment to better manage mental health symptoms, stabilization and to address treatment plan goals. Clinician recommends adherence to crisis/safety plan, taking medications as prescribed, and following up with medical professionals if any issues arise.   Follow Up Instructions: Clinician will send Webex link for next session. The Client was advised to call back or seek an in-person evaluation if the symptoms worsen or if the condition fails to improve as anticipated.     I provided 180 minutes of non-face-to-face time during this encounter.     Lise Auer, LCSW

## 2020-09-01 ENCOUNTER — Encounter (HOSPITAL_COMMUNITY): Payer: Self-pay

## 2020-09-01 ENCOUNTER — Other Ambulatory Visit (HOSPITAL_COMMUNITY): Payer: BC Managed Care – PPO | Admitting: Psychiatry

## 2020-09-01 ENCOUNTER — Other Ambulatory Visit: Payer: Self-pay

## 2020-09-01 DIAGNOSIS — F4323 Adjustment disorder with mixed anxiety and depressed mood: Secondary | ICD-10-CM

## 2020-09-01 NOTE — Progress Notes (Signed)
Virtual Visit via Video Note  I connected with [name] on 09/01/20 at  9:00 AM EDT by a video enabled telemedicine application and verified that I am speaking with the correct person using two identifiers.  At orientation to the IOP program, Case Manager discussed the limitations of evaluation and management by telemedicine and the availability of in person appointments. The patient expressed understanding and agreed to proceed with virtual visits throughout the duration of the program.   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 Client stated needs and engaged in discussion. Case Manager reminded of contact and crisis numbers, and plan for program coverage next week. Case Manager introduced a new Client to the group, with group members welcoming and starting the joining process.   Initial Therapeutic Activity: Counselor facilitated a check-in with group members to assess mood and current functioning. Counselor facilitated group discussion on following topics, sharing coping skills and strategies to address: sleep hygiene, medication side effects, medication management/administration, future planning, health and nutrition. Client engaged in discussion and provided feedback for other group members. Client denied any current SI/HI/psychosis.   Second Therapeutic Activity: Counselor provided psychoeducation on time management. Counselor provided 2 videos and 2 articles highlighting tips, philosophies and strategies on structuring your day and maximizing time and energy. Counselor prompted group members to create a weekly plan/structure and identify areas where this has been challenging in the past. Client noted time to focus  on building businiess in addition to full time work and engaged in discussion.    Check Out: Counselor prompted group members to share what self-care practice or productivity activity they will engage in today. Client plans to get out of bed and house today. Client endorsed safety plan to be followed to prevent safety issues.   Assessment and Plan: Clinician recommends that Client remain in IOP treatment to better manage mental health symptoms, stabilization and to address treatment plan goals. Clinician recommends adherence to crisis/safety plan, taking medications as prescribed, and following up with medical professionals if any issues arise.   Follow Up Instructions: Clinician will send Webex link for next session. The Client was advised to call back or seek an in-person evaluation if the symptoms worsen or if the condition fails to improve as anticipated.     I provided 180 minutes of non-face-to-face time during this encounter.     Lise Auer, LCSW

## 2020-09-02 ENCOUNTER — Other Ambulatory Visit (HOSPITAL_COMMUNITY): Payer: BC Managed Care – PPO | Admitting: Psychiatry

## 2020-09-02 ENCOUNTER — Encounter (HOSPITAL_COMMUNITY): Payer: Self-pay

## 2020-09-02 ENCOUNTER — Other Ambulatory Visit: Payer: Self-pay

## 2020-09-02 DIAGNOSIS — F4323 Adjustment disorder with mixed anxiety and depressed mood: Secondary | ICD-10-CM

## 2020-09-02 NOTE — Progress Notes (Signed)
Virtual Visit via Video Note  I connected with [name] on 09/02/20 at  9:00 AM EDT by a video enabled telemedicine application and verified that I am speaking with the correct person using two identifiers.  At orientation to the IOP program, Case Manager discussed the limitations of evaluation and management by telemedicine and the availability of in person appointments. The patient expressed understanding and agreed to proceed with virtual visits throughout the duration of the program.   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 Client stated needs and engaged in discussion. Case Manager introduced 2 new Clients to the group, with group members welcoming and starting the joining process.   Initial Therapeutic Activity: Counselor introduced Rolin Barry, [place] Chaplain to present information and discussion on Grief and Loss. Group members engaged in discussion, sharing how grief impacts them, what comforts them, what emotions are felt, labeling losses, etc. After guest speaker logged off, Counselor prompted group to spend 10-15 minutes journaling to process personal grief and loss situations. Counselor processed entries with group and client's identified areas for additional processing in individual therapy. Client noted loss related to passing of mother.   Second Therapeutic Activity: Counselor continued the conversation on grief and loss, sharing an image of a sculpture inspired by grief. Counselor prompted group members to share their reflections, with all connecting with the intended meaning. Counselor then provided information on the various types of grief, expanding Client's vocabulary and  understanding of grief and how it presents. Group members related to the various types of grief they and others have experienced. Client engaged well in conversation.   Check Out: Counselor prompted group members to share what self-care practice or productivity activity they will engage in today. Client plans to prepare for an event happening over the weekend. Client endorsed safety plan to be followed to prevent safety issues.   Assessment and Plan: Clinician recommends that Client remain in IOP treatment to better manage mental health symptoms, stabilization and to address treatment plan goals. Clinician recommends adherence to crisis/safety plan, taking medications as prescribed, and following up with medical professionals if any issues arise.   Follow Up Instructions: Clinician will send Webex link for next session. The Client was advised to call back or seek an in-person evaluation if the symptoms worsen or if the condition fails to improve as anticipated.     I provided 180 minutes of non-face-to-face time during this encounter.     Lise Auer, LCSW

## 2020-09-03 ENCOUNTER — Other Ambulatory Visit (HOSPITAL_COMMUNITY): Payer: BC Managed Care – PPO | Admitting: Psychiatry

## 2020-09-03 ENCOUNTER — Other Ambulatory Visit: Payer: Self-pay

## 2020-09-03 ENCOUNTER — Encounter (HOSPITAL_COMMUNITY): Payer: Self-pay

## 2020-09-03 DIAGNOSIS — F4323 Adjustment disorder with mixed anxiety and depressed mood: Secondary | ICD-10-CM

## 2020-09-03 NOTE — Progress Notes (Signed)
Virtual Visit via Video Note  I connected with [name] on 09/03/20 at  9:00 AM EDT by a video enabled telemedicine application and verified that I am speaking with the correct person using two identifiers.  At orientation to the IOP program, Case Manager discussed the limitations of evaluation and management by telemedicine and the availability of in person appointments. The patient expressed understanding and agreed to proceed with virtual visits throughout the duration of the program.   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 Client stated needs and engaged in discussion. Case Manager introduced a new Clients to the group, with group members welcoming and starting the joining process.   Initial Therapeutic Activity: Counselor facilitated a check-in with group members to assess mood and current functioning. Client shared details of their mental health management since our last session, including challenges and successes. Counselor engaged group in discussion, covering the following topics: AVH, ADLS, coping strategies, distraction methods, and SI/HI reduction. Client presents with moderate to severe depression and moderate anxiety. Client denied any current SI/HI/psychosis.   Second Therapeutic Activity: Counselor introduced [employer], representative with [employer] to share about programming. Group Members asked questions and engaged in discussion, as Cristie Hem shared about Peer Support, Support Groups and the [employer]. Client stated that they are interested in connecting with the women's support groups and crafts.   Check Out: Counselor prompted group members to share what  self-care practice or productivity activity they will engage in today. Client plans to complete household chores to better enjoy her space.. Client endorsed safety plan to be followed to prevent safety issues.   Assessment and Plan: Clinician recommends that Client remain in IOP treatment to better manage mental health symptoms, stabilization and to address treatment plan goals. Clinician recommends adherence to crisis/safety plan, taking medications as prescribed, and following up with medical professionals if any issues arise.   Follow Up Instructions: Clinician will send Webex link for next session. The Client was advised to call back or seek an in-person evaluation if the symptoms worsen or if the condition fails to improve as anticipated.     I provided 180 minutes of non-face-to-face time during this encounter.     Lise Auer, LCSW

## 2020-09-04 ENCOUNTER — Other Ambulatory Visit: Payer: Self-pay

## 2020-09-04 ENCOUNTER — Other Ambulatory Visit (HOSPITAL_COMMUNITY): Payer: BC Managed Care – PPO | Admitting: Psychiatry

## 2020-09-04 DIAGNOSIS — F4323 Adjustment disorder with mixed anxiety and depressed mood: Secondary | ICD-10-CM

## 2020-09-07 ENCOUNTER — Other Ambulatory Visit: Payer: Self-pay

## 2020-09-07 ENCOUNTER — Telehealth (HOSPITAL_COMMUNITY): Payer: Self-pay | Admitting: Psychiatry

## 2020-09-07 ENCOUNTER — Encounter (HOSPITAL_COMMUNITY): Payer: Self-pay

## 2020-09-07 ENCOUNTER — Other Ambulatory Visit (HOSPITAL_COMMUNITY): Payer: BC Managed Care – PPO | Admitting: Psychiatry

## 2020-09-07 NOTE — Progress Notes (Signed)
Virtual Visit via Video Note  I connected with [name] on 09/04/2020 at  9:00 AM EDT by a video enabled telemedicine application and verified that I am speaking with the correct person using two identifiers.  At orientation to the IOP program, Case Manager discussed the limitations of evaluation and management by telemedicine and the availability of in person appointments. The patient expressed understanding and agreed to proceed with virtual visits throughout the duration of the program.   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 Client stated needs and engaged in discussion.   Initial Therapeutic Activity: Counselor facilitated a check-in with group members to assess mood and current functioning. Client shared details of their mental health management since our last session, including challenges and successes. Counselor engaged group in discussion, covering the following topics: work related stressors, coping strategies utilizing creativity, resource linkage, and caregiving. Client presents with moderate depression and moderate anxiety. Client denied any current SI/HI/psychosis.    Second Therapeutic Activity: Counselor engaged the group in completing a [place]. The plan is both to be used as a preventative and emergent method to reduce suicidal and homicidal behaviors, as well as other mental health crises. Counselor walked group members through each step sharing examples and application of skills. Due to time we will complete plan for homework and report out, finalizing drafts at next session. Group members engaged well in activity. Client shared several pertinent examples of  application of steps.    Check Out: Counselor prompted group members to share what self-care practice or productivity activity they will engage in over the weekend. Client plans to engage in religious celebrations and events with friends and family. Client endorsed safety plan to be followed to prevent safety issues.   Assessment and Plan: Clinician recommends that Client remain in IOP treatment to better manage mental health symptoms, stabilization and to address treatment plan goals. Clinician recommends adherence to crisis/safety plan, taking medications as prescribed, and following up with medical professionals if any issues arise.   Follow Up Instructions: Clinician will send Webex link for next session. The Client was advised to call back or seek an in-person evaluation if the symptoms worsen or if the condition fails to improve as anticipated.     I provided 180 minutes of non-face-to-face time during this encounter.     Lise Auer, LCSW

## 2020-09-08 ENCOUNTER — Other Ambulatory Visit: Payer: Self-pay

## 2020-09-08 ENCOUNTER — Other Ambulatory Visit (HOSPITAL_COMMUNITY): Payer: BC Managed Care – PPO | Attending: Psychiatry | Admitting: Psychiatry

## 2020-09-08 DIAGNOSIS — F4323 Adjustment disorder with mixed anxiety and depressed mood: Secondary | ICD-10-CM | POA: Diagnosis not present

## 2020-09-08 NOTE — Progress Notes (Signed)
Virtual Visit via Video Note  I connected with [name] on @TODAY @ at  9:00 AM EDT by a video enabled telemedicine application and verified that I am speaking with the correct person using two identifiers.  Location: Patient: at home Provider: at office   I discussed the limitations of evaluation and management by telemedicine and the availability of in person appointments. The patient expressed understanding and agreed to proceed.  I discussed the assessment and treatment plan with the patient. The patient was provided an opportunity to ask questions and all were answered. The patient agreed with the plan and demonstrated an understanding of the instructions.   The patient was advised to call back or seek an in-person evaluation if the symptoms worsen or if the condition fails to improve as anticipated.  I provided 20 minutes of non-face-to-face time during this encounter.   Carlis Abbott, RITA, M.Ed, CNA   Patient ID: [name], female   DOB: 02-03-1976, 45 y.o.   MRN: [id number]  As per previous CCA states:  This is a 45 yr old engaged, employed, [place] American female who was referred per a previous pt; d/t worsening depressive and anxiety symptoms.  Denies SI/HI or A/V hallucinations.  Reports struggling with symptoms for 3-4 months before they started worsening recently.  Triggers/Stressors:  1) In Sept 2021 pt's 11 yr old daughter was dx'd with MS.  During that time, according to pt, her whold household had COVID-19.  2) Medical Issue:  In Nov. 2021, pt was dx'd with MS.  3) Job ([employer]) of three yrs.  According to pt, she hasn't been able to meet her quota d/t unrealistic expectations.  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  4) No support system since her mother passed in 2017.  Pt denies any prior psychiatric hospitalizations or suicide attempts/gestures.  States she hasn't ever seen any behavioral health providers before.  Family hx:  Father and P-Uncles (Drugs/ETOH)  Current  Symptoms/Problems: Panic attacks (2-3 X per week:  at night), sadness, increased anxiety, decreased appetite (lost 30 lbs within one month), flucuating sleep (difficult to get back to sleep), poor energy, decreased concentration, irritable, c/o hair falling out, anhedonia, isolative   Pt completed MH-IOP today.  Reports overall mood is improving.  On a scale of 1-10: pt rates her depression at a 6 and anxiety at 5.  Denies SI/HI or A/V hallucinations.  Pt states she is anxious about returning to work on 09-14-20.  A:  D/C today.  F/U with EAP (per pt's request).  RTW on 09-14-20 without any restrictions.  R:  Pt receptive.  Dellia Nims, M.Ed,CNA

## 2020-09-08 NOTE — Patient Instructions (Signed)
D:  Patient completed MH-IOP today.  A:  Discharge today.  Follow up with an employee assistance program therapist.  Return to work on 09-14-20; without any restrictions.  R:  Patient receptive.

## 2020-09-09 ENCOUNTER — Encounter (HOSPITAL_COMMUNITY): Payer: Self-pay | Admitting: Psychiatry

## 2020-09-09 ENCOUNTER — Other Ambulatory Visit: Payer: Self-pay

## 2020-09-09 ENCOUNTER — Encounter (HOSPITAL_COMMUNITY): Payer: Self-pay | Admitting: Family

## 2020-09-09 ENCOUNTER — Other Ambulatory Visit (HOSPITAL_COMMUNITY): Payer: BC Managed Care – PPO | Admitting: Family

## 2020-09-09 NOTE — Progress Notes (Signed)
Virtual Visit via Video Note  I connected with [name] on 09/08/20 at  9:00 AM EDT by a video enabled telemedicine application and verified that I am speaking with the correct person using two identifiers.  At orientation to the IOP program, Case Manager discussed the limitations of evaluation and management by telemedicine and the availability of in person appointments. The patient expressed understanding and agreed to proceed with virtual visits throughout the duration of the program.   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 Client stated needs and engaged in discussion. Case Manager introduced a new Client to the group with the group members beginning the joining process.   Initial Therapeutic Activity: Counselor facilitated a check-in with group members to assess mood and current functioning. Client shared details of their mental health management since our last session, including challenges and successes. Counselor engaged group in discussion, covering the following topics: work related stressors, coping strategies, relational issues and utilizing support systems. Client presents with moderate depression and moderate anxiety. Client denied any current SI/HI/psychosis.    Second Therapeutic Activity: Counselor reviewed completed Safety/Crisis Plans with group members who started theirs on Friday. Counselor engaged new group members in completing full document. Counselor prompted group members to share their plans with the group to generate ideas and feedback on thoroughness/effectiveness. Counselor encouraged group members to share their plans with their support system and to have a final drafted copy readily  available to access to prevent MH crisis. Client engaged well in activity and discussions. Client intends to shared with appropriate people.    Check Out: Counselor closed program by allowing time to celebrate the Client as a graduating group member. Counselor shared reflections on progress and allow space for group members to share well wishes and appreciates to the graduating client. Counselor prompted graduating client to share takeaways, reflect on progress and final thoughts for the group. Client noted that she has increased her self-care practices and feels more capable of managing emotions and life stressors. Client endorsed safety plan to be followed to prevent safety issues.   Assessment and Plan: Clinician recommends that Client step down to support groups, individual therapy and medication management. Clinician recommends adherence to crisis/safety plan, taking medications as prescribed, and following up with medical professionals if any issues arise.   Follow Up Instructions: Clinician will send Webex link for next session. The Client was advised to call back or seek an in-person evaluation if the symptoms worsen or if the condition fails to improve as anticipated.     I provided 180 minutes of non-face-to-face time during this encounter.     Lise Auer, LCSW
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
## 2020-09-10 ENCOUNTER — Ambulatory Visit (HOSPITAL_COMMUNITY): Payer: BC Managed Care – PPO

## 2020-09-11 ENCOUNTER — Ambulatory Visit (HOSPITAL_COMMUNITY): Payer: BC Managed Care – PPO

## 2020-09-24 ENCOUNTER — Ambulatory Visit (HOSPITAL_COMMUNITY): Payer: BC Managed Care – PPO | Admitting: Clinical

## 2020-09-27 ENCOUNTER — Emergency Department (HOSPITAL_COMMUNITY): Payer: BC Managed Care – PPO

## 2020-09-27 ENCOUNTER — Emergency Department (HOSPITAL_COMMUNITY)
Admission: EM | Admit: 2020-09-27 | Discharge: 2020-09-27 | Disposition: A | Discharge status: Home / Self Care | Payer: BC Managed Care – PPO | Attending: Emergency Medicine | Admitting: Emergency Medicine

## 2020-09-27 ENCOUNTER — Encounter (HOSPITAL_COMMUNITY): Payer: Self-pay | Admitting: Emergency Medicine

## 2020-09-27 ENCOUNTER — Other Ambulatory Visit: Payer: Self-pay

## 2020-09-27 DIAGNOSIS — R220 Localized swelling, mass and lump, head: Secondary | ICD-10-CM | POA: Diagnosis present

## 2020-09-27 DIAGNOSIS — L03811 Cellulitis of head [any part, except face]: Secondary | ICD-10-CM | POA: Diagnosis not present

## 2020-09-27 LAB — CBC WITH DIFFERENTIAL/PLATELET
Abs Immature Granulocytes: 0.01 10*3/uL (ref 0.00–0.07)
Basophils Absolute: 0 10*3/uL (ref 0.0–0.1)
Basophils Relative: 1 %
Eosinophils Absolute: 0.4 10*3/uL (ref 0.0–0.5)
Eosinophils Relative: 8 %
HCT: 38.1 % (ref 36.0–46.0)
Hemoglobin: 11.3 g/dL — ABNORMAL LOW (ref 12.0–15.0)
Immature Granulocytes: 0 %
Lymphocytes Relative: 31 %
Lymphs Abs: 1.6 10*3/uL (ref 0.7–4.0)
MCH: 21.9 pg — ABNORMAL LOW (ref 26.0–34.0)
MCHC: 29.7 g/dL — ABNORMAL LOW (ref 30.0–36.0)
MCV: 73.8 fL — ABNORMAL LOW (ref 80.0–100.0)
Monocytes Absolute: 0.2 10*3/uL (ref 0.1–1.0)
Monocytes Relative: 4 %
Neutro Abs: 2.9 10*3/uL (ref 1.7–7.7)
Neutrophils Relative %: 56 %
Platelets: 289 10*3/uL (ref 150–400)
RBC: 5.16 MIL/uL — ABNORMAL HIGH (ref 3.87–5.11)
RDW: 15.4 % (ref 11.5–15.5)
WBC: 5.1 10*3/uL (ref 4.0–10.5)
nRBC: 0 % (ref 0.0–0.2)

## 2020-09-27 LAB — BASIC METABOLIC PANEL
Anion gap: 3 — ABNORMAL LOW (ref 5–15)
BUN: 9 mg/dL (ref 6–20)
CO2: 29 mmol/L (ref 22–32)
Calcium: 8.9 mg/dL (ref 8.9–10.3)
Chloride: 106 mmol/L (ref 98–111)
Creatinine, Ser: 0.65 mg/dL (ref 0.44–1.00)
GFR, Estimated: >60 mL/min (ref >60)
Glucose, Bld: 97 mg/dL (ref 70–99)
Potassium: 3.8 mmol/L (ref 3.5–5.1)
Sodium: 138 mmol/L (ref 135–145)

## 2020-09-27 IMAGING — CT CT NECK W/O CM
4 series · 15 of 33 positions shown, 18 images · non-contrast
Comparison: None.

CLINICAL DATA: Soft tissue mass neck

EXAM:
CT NECK WITHOUT CONTRAST
TECHNIQUE: Multidetector CT imaging of the neck was performed following the
standard protocol without intravenous contrast.

[Series 3: axial neck · axial · 0.46mm/px · z∈[-362,-194]mm · 5 of 126 slices shown, 7 images]
[im 21/126  soft-tissue]
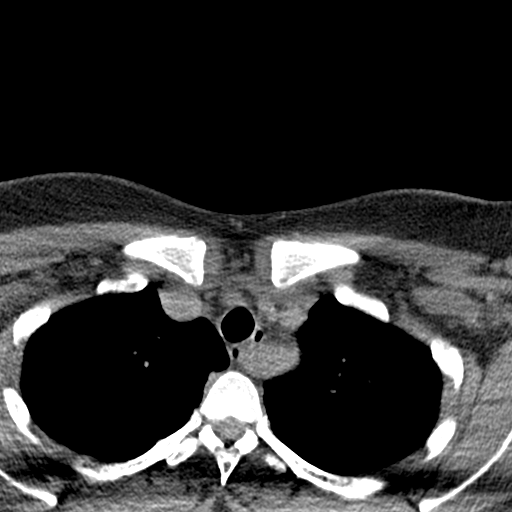
[im 21/126  bone]
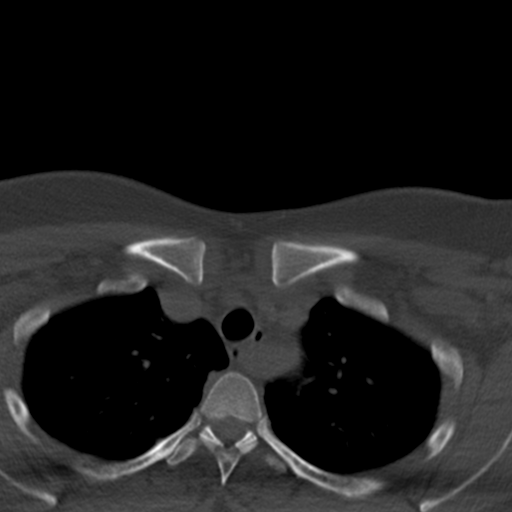
[im 42/126  bone]
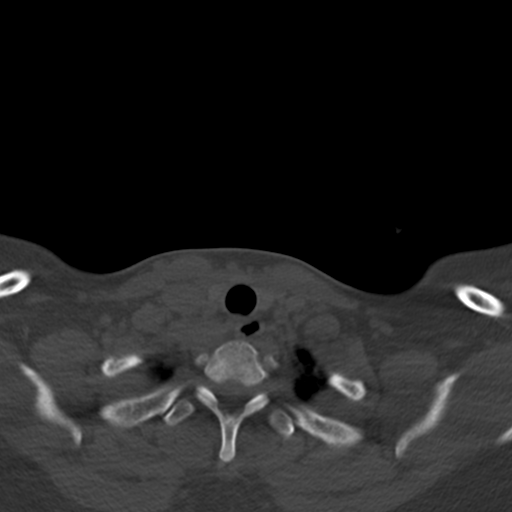
[im 63/126  bone]
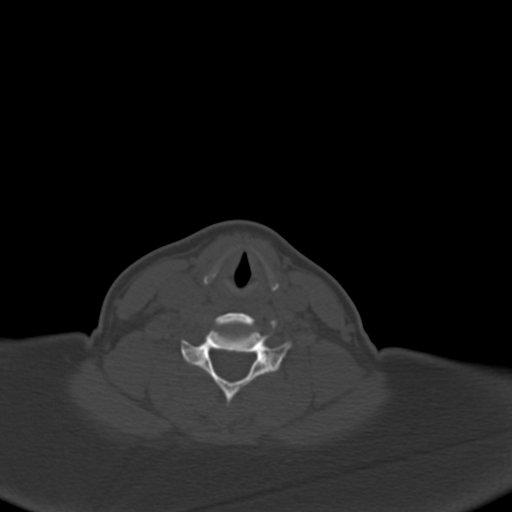
[im 84/126  bone]
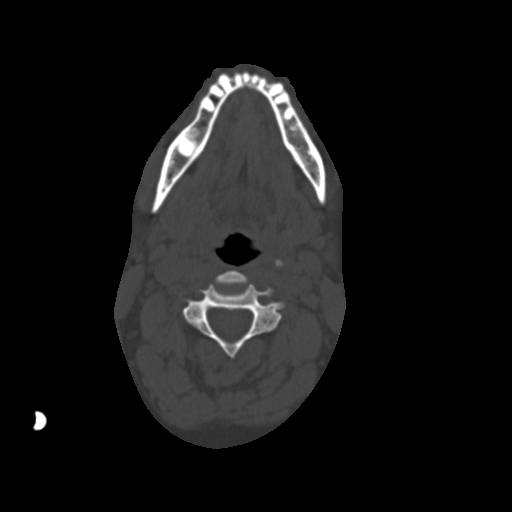
[im 105/126  soft-tissue]
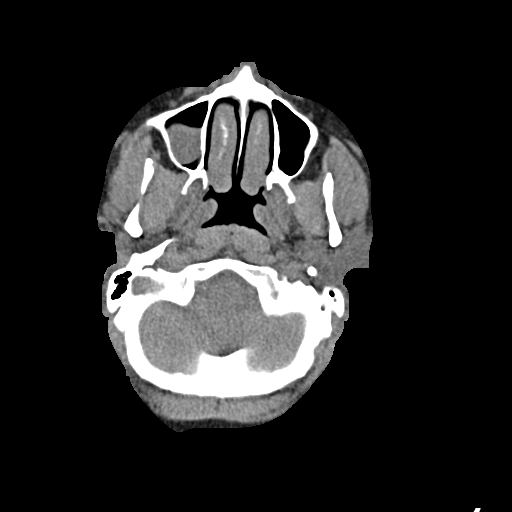
[im 105/126  bone]
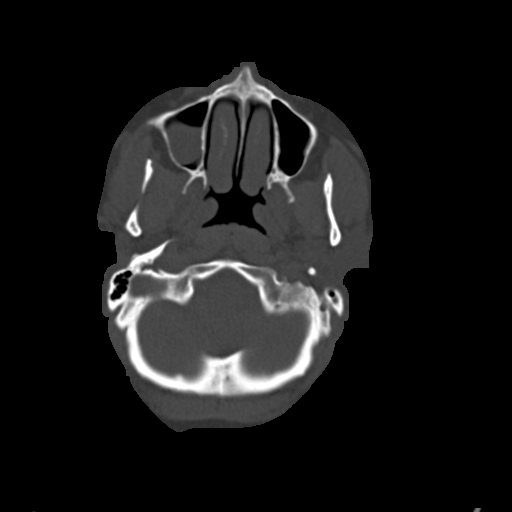

[Series 6: orthogonal ax · axial · 0.41mm/px · z∈[-358,-316]mm · 2 of 124 slices shown]
[im 21/124  bone]
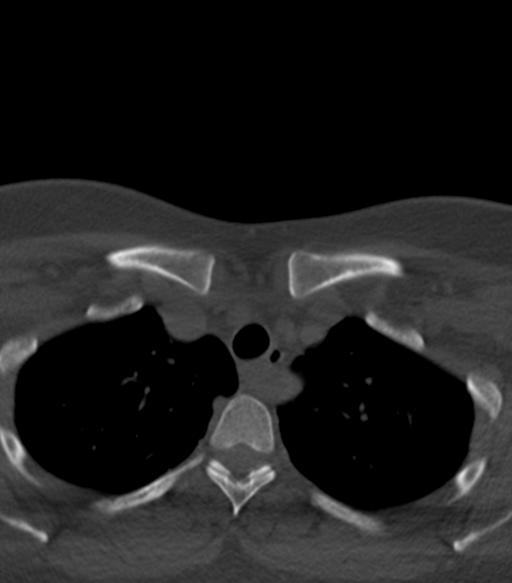
[im 42/124  bone]
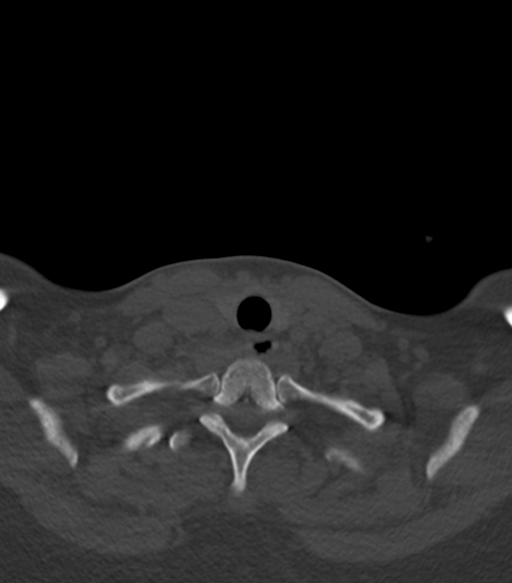

[Series 7: cor neck · coronal · 0.49mm/px · 3 of 116 slices shown]
[im 24/116  bone]
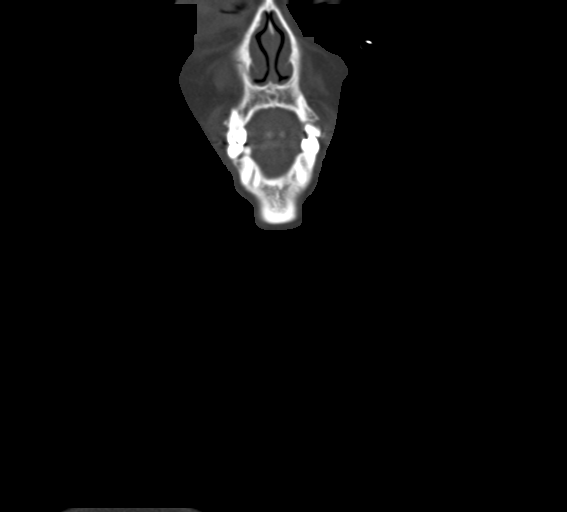
[im 47/116  bone]
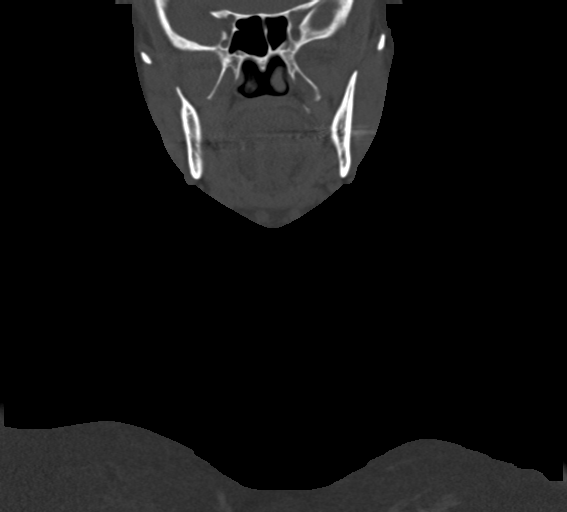
[im 70/116  bone]
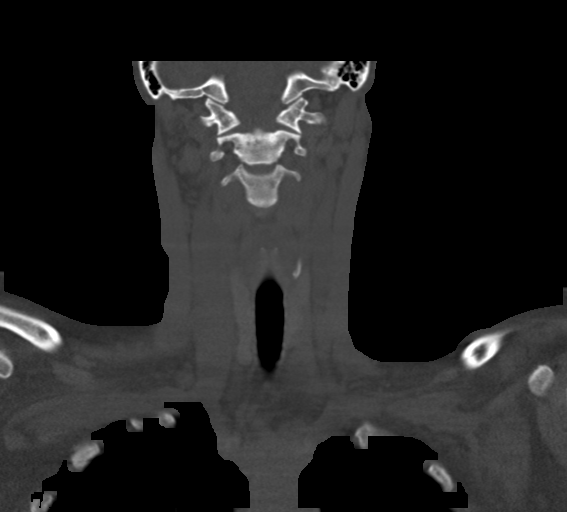

[Series 8: sag neck · sagittal · 0.49mm/px · 5 of 101 slices shown, 6 images]
[im 34/101  bone]
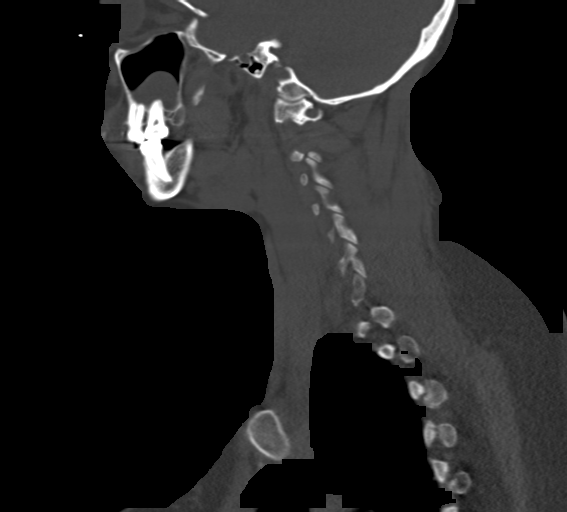
[im 42/101  bone]
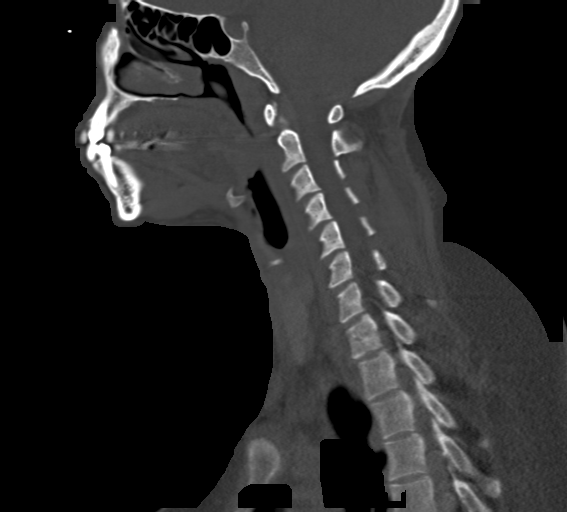
[im 51/101  soft-tissue]
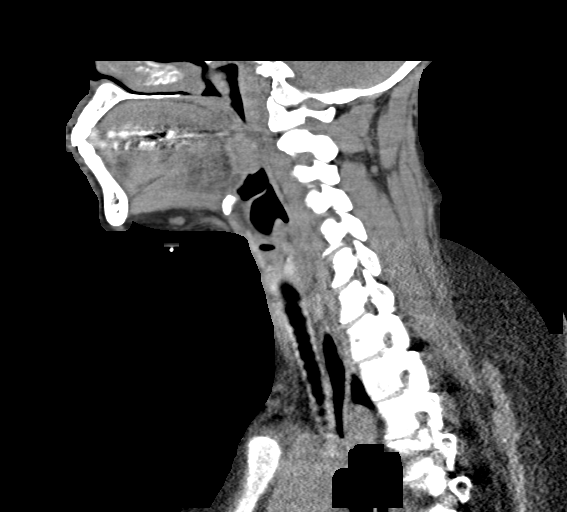
[im 51/101  bone]
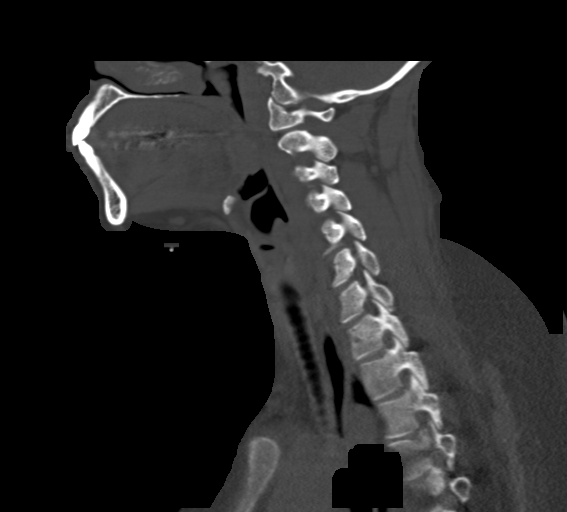
[im 59/101  bone]
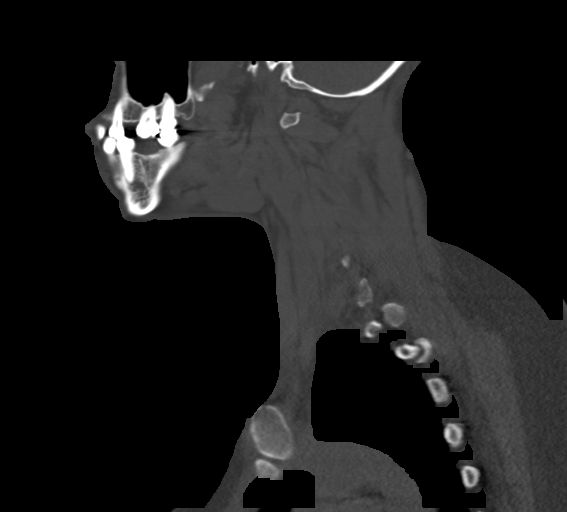
[im 67/101  bone]
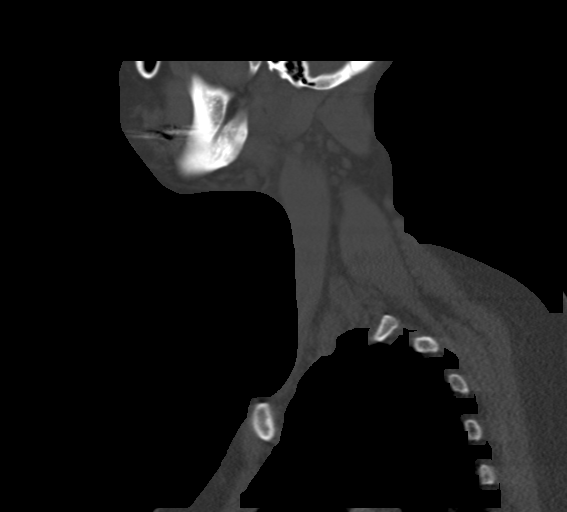

[15 of 33 positions shown; findings below may reference images not displayed]

FINDINGS: Pharynx and larynx: Normal. No mass or swelling.

Salivary glands: No inflammation, mass, or stone.

Thyroid: Negative

Lymph nodes: No enlarged lymph nodes.

Cluster of 4 submental lymph nodes are present bilaterally measuring
between 4 and 7 mm. These are surrounded by soft tissue stranding in
the fat extending to the skin surface. The skin is thickened in this
area. The small lymph nodes have ill-defined margins and stranding
in the adjacent fat.

Vascular: Limited vascular evaluation without intravenous or
contrast

Limited intracranial: Negative

Visualized orbits: Negative

Mastoids and visualized paranasal sinuses: Mucosal edema paranasal
sinuses. Cyst right maxillary sinus.

Skeleton: Negative

Upper chest: Lung apices clear bilaterally.

Other: None
IMPRESSION: There is skin thickening and soft tissue stranding below the chin in
the midline. There are adjacent small submental lymph nodes in the
area. Findings are most consistent with cellulitis and mild
adenitis. No underlying mass identified.

## 2020-09-27 MED ORDER — CEPHALEXIN 500 MG PO CAPS: 500.0000 mg | ORAL_CAPSULE | Freq: Four times a day (QID) | ORAL | 0 refills | Status: AC

## 2020-09-27 MED ORDER — CEPHALEXIN 500 MG PO CAPS
500.0000 mg | ORAL_CAPSULE | Freq: Once | ORAL | Status: AC
  Administered 2020-09-27: 500 mg via ORAL
  Filled 2020-09-27: qty 1

## 2020-09-27 NOTE — Progress Notes (Signed)
Virtual Visit via Video Note  I connected with [name] on 08/28/20 at  9:00 AM EDT by a video enabled telemedicine application and verified that I am speaking with the correct person using two identifiers.  Location: Patient: patient home Provider: clinical home office   At orientation to the IOP program, Case Managerdiscussed the limitations of evaluation and management by telemedicine and the availability of in person appointments. The patient expressed understanding and agreed to proceed with virtual visits throughout the duration of the program.  History of Present Illness: Adjustment Disorder   Observations/Objective: 9:00 - 10:00: Clinician led check-in regarding current stressors and situation.Clinician utilized active listening and empathetic response and validated patient emotions. Clinician facilitated processing group on pertinent issues.  Patient arrived within time allowed and reports that she is feeling "good." Patient rates hermood at [facility] a scale of 1-10 with 10 being great. Pt presents as brighter and increased verbalization. Pt reports she baked yesterday and talked to a coworker. Pt denies SI/HI. Pt able to process. Pt engaged in discussion.   10:00 - 11:00: Cln led discussion on support groups and the role they can provide in recovery and ongoing treatment. Group discussed success and barriers they have had with past support groups or they have with considering support groups. Cln provided resources for local support groups.   Pt engaged in discussion and reports interest in engaging in support groups.    11:00 - 12:00: Cln continued topic of healthy relationships. Cln discussed the way in which self-esteem can affect what we accept in relationships. Cln emphasized focusing on actions rather than words with those we are in relationship with. Group members shared ways in which their self-esteem has affected relationship dynamics.  Pt engaged in discussion and is  able to process and gain insight.   At check-out, cln prompted group members to utilize skills and engage in self-care during their afternoon. Pt denies SI/HI and will follow safety plan should safety issues arise.   Assessment and Plan: Clinician recommends that Client remain in IOP treatment to better manage mental health symptoms, stabilization and to address treatment plan goals. Clinician recommends adherence to crisis/safety plan, taking medications as prescribed, and following up with medical professionals if any issues arise.  Follow Up Instructions: Clinician will send Webex link for next session. The Client was advised to call back or seek an in-person evaluation if the symptoms worsen or if the condition fails to improve as anticipated.     I provided 180 minutes of non-face-to-face time during this encounter.   Lorin Glass, LCSW

## 2020-09-27 NOTE — ED Notes (Signed)
Failed attempt at IV and blood work x 2 ; awaiting [place] RN to attempt.

## 2020-09-27 NOTE — Progress Notes (Signed)
Virtual Visit via Video Note  I connected with [name] on 08/26/20 at  9:00 AM EDT by a video enabled telemedicine application and verified that I am speaking with the correct person using two identifiers.  Location: Patient: patient home Provider: clinical home office   At orientation to the IOP program, Case Managerdiscussed the limitations of evaluation and management by telemedicine and the availability of in person appointments. The patient expressed understanding and agreed to proceed with virtual visits throughout the duration of the program.  History of Present Illness: Adjustment Disorder   Observations/Objective: 9:00 - 10:00: Pharmacist, Dickie La, led pharmacy group educating on medications and the role medication plays in mental wellness.  Pt participated in group.   10:00 - 11:00: Clinician led check-in regarding current stressors and situation.Clinician utilized active listening and empathetic response and validated patient emotions. Clinician facilitated processing group on pertinent issues.  Patient arrived within time allowed and reports that she is feeling "good." Patient rates hermood at [facility] a scale of 1-10 with 10 being great. Pt states she spent yesterday cleaning and resting. Pt reports feeling extra tired due to currently fasting.  Pt denies SI/HI. Pt able to process. Pt engaged in discussion.    11:00 - 12:00: Guest speaker, J. Athas, led discussion on wellness and how we can support our mental health by engaging in overall wellness practices. Pt participated in group.  At check-out, cln prompted group members to utilize skills and engage in self-care during their afternoon. Pt denies SI/HI and will follow safety plan should safety issues arise.   Assessment and Plan: Clinician recommends that Client remain in IOP treatment to better manage mental health symptoms, stabilization and to address treatment plan goals. Clinician recommends adherence to  crisis/safety plan, taking medications as prescribed, and following up with medical professionals if any issues arise.  Follow Up Instructions: Clinician will send Webex link for next session. The Client was advised to call back or seek an in-person evaluation if the symptoms worsen or if the condition fails to improve as anticipated.     I provided 180 minutes of non-face-to-face time during this encounter.   Lorin Glass, LCSW

## 2020-09-27 NOTE — Progress Notes (Addendum)
Virtual Visit via Video Note  I connected with [name] on 08/24/20 at  9:00 AM EDT by a video enabled telemedicine application and verified that I am speaking with the correct person using two identifiers.  Location: Patient: patient home Provider: clinical home office   At orientation to the IOP program, Case Managerdiscussed the limitations of evaluation and management by telemedicine and the availability of in person appointments. The patient expressed understanding and agreed to proceed with virtual visits throughout the duration of the program.  History of Present Illness: Adjustment Disorder   Observations/Objective: 9:00 - 10:00: Clinician led check-in regarding current stressors and situation.Clinician utilized active listening and empathetic response and validated patient emotions. Clinician facilitated processing group on pertinent issues.  Patient arrived within time allowed and reports that she is feeling "good." Patient rates hermood at a8.5on a scale of 1-10 with 10 being great. Pt reports she stayed busy this weekend to manage mood and slept decently. Pt denies SI/HI over the weekend. Pt able to process. Pt engaged in discussion.   10:00 - 11:00: Cln facilitated processing group around difficult relationships. Group members shared struggles they face in relationships. Cln brought in topics of self-esteem, CBT thought challenging, boundaries, and communication to aid growth. Pt engaged in discussion and is able to process.    11:00 - 12:00: Cln introduced topic of healthy relationships. Cln discussed the way in which self-esteem can affect what we accept in relationships. Cln emphasized focusing on actions rather than words with those we are in relationship with. Group members shared ways in which their self-esteem has affected relationship dynamics.  Pt engaged in discussion and is able to process and gain insight.   At check-out, cln prompted group members to  utilize skills and engage in self-care during their afternoon. Pt denies SI/HI and will follow safety plan should safety issues arise.   Assessment and Plan: Clinician recommends that Client remain in IOP treatment to better manage mental health symptoms, stabilization and to address treatment plan goals. Clinician recommends adherence to crisis/safety plan, taking medications as prescribed, and following up with medical professionals if any issues arise.  Follow Up Instructions: Clinician will send Webex link for next session. The Client was advised to call back or seek an in-person evaluation if the symptoms worsen or if the condition fails to improve as anticipated.     I provided 180 minutes of non-face-to-face time during this encounter.   Lorin Glass, LCSW

## 2020-09-27 NOTE — Progress Notes (Signed)
Virtual Visit via Video Note  I connected with [name] on 08/27/20 at  9:00 AM EDT by a video enabled telemedicine application and verified that I am speaking with the correct person using two identifiers.  Location: Patient: patient home Provider: clinical home office   At orientation to the IOP program, Case Managerdiscussed the limitations of evaluation and management by telemedicine and the availability of in person appointments. The patient expressed understanding and agreed to proceed with virtual visits throughout the duration of the program.  History of Present Illness: Adjustment Disorder   Observations/Objective: 9:00 - 10:00: Clinician led check-in regarding current stressors and situation.Clinician utilized active listening and empathetic response and validated patient emotions. Clinician facilitated processing group on pertinent issues.  Patient arrived within time allowed and reports that she is feeling "stressed." Patient rates hermood at [facility] a scale of 1-10 with 10 being great. Pt reports she has "a lot going on" and is struggling to manage. Pt denies SI/HI over the weekend. Pt able to process. Pt engaged in discussion.   10:00 - 11:00: Cln led discussion on the 5 stages of grief and the way loss affects [place]. Cln encouraged pt to consider grief as a journey to accepting a new future. Cln created space for pt to process grief concerns and validated pt's experiences.  Pt engaged in discussion and was able to identify areas of loss and process.    11:00 - 12:00: Cln led activity on finding what makes you happy. Cln tasked group members to consider times in which they have been happy in the past, moments in which they have felt less distressed recently, and things that give them any rumblings of joy. Cln worked with pt's to process barriers and to consider one thing for now, not the bigger picture.  Pt engaged in discussion and struggled to think of times she has been  happy. Pt able to process and come up with one thing thing to do to make herself happy.   At check-out, cln prompted group members to utilize skills and engage in self-care during their afternoon. Pt denies SI/HI and will follow safety plan should safety issues arise.   Assessment and Plan: Clinician recommends that Client remain in IOP treatment to better manage mental health symptoms, stabilization and to address treatment plan goals. Clinician recommends adherence to crisis/safety plan, taking medications as prescribed, and following up with medical professionals if any issues arise.  Follow Up Instructions: Clinician will send Webex link for next session. The Client was advised to call back or seek an in-person evaluation if the symptoms worsen or if the condition fails to improve as anticipated.     I provided 180 minutes of non-face-to-face time during this encounter.   Lorin Glass, LCSW

## 2020-09-27 NOTE — ED Provider Notes (Signed)
[facility] DEPT Provider Note   CSN: [id number] Arrival date & time: 09/27/20  9371     History Chief Complaint  Patient presents with  . knot under chin    [name] is a 45 y.o. female with past medical history significant for MS.  HPI Patient presents to emergency department today with chief complaint of knot under chin x1 week.  She states that has been getting progressively larger.  She states it is tender to the touch and she has had trouble swallowing.  She rates pain 4/10 in severity. She describes pain as a soreness. She states that occasionally she will have difficulty swallowing her MS medications and thought at first that is what might be going on.  She states she noticed that it looked like she had a double chin which is unusual for her.  She is able to eat and drink.  She denies any recent illness.  Denies any weight loss, night sweats, fever, chills, congestion, sore throat, rash.  No sick contacts or known COVID exposures.   Past Medical History:  Diagnosis Date  . Anxiety   . BMI 30.0-30.9,adult   . Depression   . Uterine fibroid     Patient Active Problem List   Diagnosis Date Noted  . BMI 30.0-30.9,adult   . Numbness and tingling of right arm and leg 04/12/2020  . Multiple sclerosis ([place]) 04/11/2020  . Viral URI with cough 06/06/2018    Past Surgical History:  Procedure Laterality Date  . ABDOMINAL HYSTERECTOMY     2010  . CESAREAN SECTION     x 2     OB History    Gravida  7   Para  5   Term  4   Preterm  1   AB  2   Living  5     SAB  1   IAB  1   Ectopic      Multiple      Live Births              Family History  Problem Relation Age of Onset  . Cancer Mother   . HIV Father   . Multiple sclerosis Daughter   . Multiple sclerosis Cousin     Social History   Tobacco Use  . Smoking status: Never Smoker  . Smokeless tobacco: Never Used  Vaping Use  . Vaping Use: Never used   Substance Use Topics  . Alcohol use: No  . Drug use: No    Home Medications Prior to Admission medications   Medication Sig Start Date End Date Taking? Authorizing Provider  cephALEXin (KEFLEX) 500 MG capsule Take 1 capsule (500 mg total) by mouth 4 (four) times daily for 7 days. 09/28/20 10/05/20 Yes Walisiewicz, Harley Hallmark, PA-C  aspirin 325 MG EC tablet Take 650 mg by mouth every 6 (six) hours as needed for pain.    [provider]  cholecalciferol (VITAMIN D3) 25 MCG (1000 UNIT) tablet Take 1 tablet (1,000 Units total) by mouth daily. To start after weekly Ergocalciferol is completed 05/18/20   Domenic Polite, MD  Glatiramer Acetate 40 MG/ML SOSY Inject into the skin.    [provider]  meloxicam (MOBIC) 7.5 MG tablet Take 1 tablet (7.5 mg total) by mouth daily. 06/01/20   Lomax, Amy, NP  Vitamin D, Ergocalciferol, (DRISDOL) 1.25 MG (50000 UNIT) CAPS capsule Take 1 capsule (50,000 Units total) by mouth every 7 (seven) days. 05/21/20   Sater,  Nanine Means, MD    Allergies    Pork-derived products  Review of Systems   Review of Systems All other systems are reviewed and are negative for acute change except as noted in the HPI.  Physical Exam Updated Vital Signs BP (!) 147/105 (BP Location: Right Arm)   Pulse 88   Temp 97.8 F (36.6 C) (Oral)   Resp 17   Ht 5\' 7"  (1.702 m)   Wt 97 kg   SpO2 99%   BMI 33.49 kg/m   Physical Exam Vitals and nursing note reviewed.  Constitutional:      General: She is not in acute distress.    Appearance: She is not ill-appearing.     Comments: Airway is patent.  She speaking in full sentences.  No voice changes.  HENT:     Head: Normocephalic and atraumatic.      Comments: Patient with soft mandibular region.  There is a palpable enlarged submental lymph node.  He is tender to palpation.  There is no overlying erythema or fluctuance.  There is no induration.    Right Ear: Tympanic membrane and external ear normal.     Left  Ear: Tympanic membrane and external ear normal.     Nose: Nose normal.     Mouth/Throat:     Mouth: Mucous membranes are moist. No oral lesions.     Dentition: Normal dentition. No dental caries or dental abscesses.     Pharynx: Oropharynx is clear. Uvula midline. No pharyngeal swelling, oropharyngeal exudate, posterior oropharyngeal erythema or uvula swelling.     Tonsils: No tonsillar exudate or tonsillar abscesses.  Eyes:     General: No scleral icterus.       Right eye: No discharge.        Left eye: No discharge.     Extraocular Movements: Extraocular movements intact.     Conjunctiva/sclera: Conjunctivae normal.     Pupils: Pupils are equal, round, and reactive to light.  Neck:     Thyroid: No thyroid mass, thyromegaly or thyroid tenderness.     Vascular: No JVD.     Comments: Full range of motion of neck. Cardiovascular:     Rate and Rhythm: Normal rate and regular rhythm.     Pulses: Normal pulses.          Radial pulses are 2+ on the right side and 2+ on the left side.     Heart sounds: Normal heart sounds.  Pulmonary:     Comments: Lungs clear to auscultation in all fields. Symmetric chest rise. No wheezing, rales, or rhonchi. Abdominal:     Comments: Abdomen is soft, non-distended, and non-tender in all quadrants. No rigidity, no guarding. No peritoneal signs.  Musculoskeletal:        General: Normal range of motion.     Cervical back: Normal range of motion.  Lymphadenopathy:     Cervical: No cervical adenopathy.  Skin:    General: Skin is warm and dry.     Capillary Refill: Capillary refill takes less than 2 seconds.  Neurological:     Mental Status: She is oriented to person, place, and time.     GCS: GCS eye subscore is 4. GCS verbal subscore is 5. GCS motor subscore is 6.     Comments: Fluent speech, no facial droop.  Psychiatric:        Behavior: Behavior normal.     ED Results / Procedures / Treatments   Labs (all labs ordered are  listed, but only  abnormal results are displayed) Labs Reviewed  BASIC METABOLIC PANEL - Abnormal; Notable for the following components:      Result Value   Anion gap 3 (*)    All other components within normal limits  CBC WITH DIFFERENTIAL/PLATELET - Abnormal; Notable for the following components:   RBC 5.16 (*)    Hemoglobin 11.3 (*)    MCV 73.8 (*)    MCH 21.9 (*)    MCHC 29.7 (*)    All other components within normal limits    EKG None  Radiology CT Soft Tissue Neck Wo Contrast  Result Date: 09/27/2020 CLINICAL DATA:  Soft tissue mass neck EXAM: CT NECK WITHOUT CONTRAST TECHNIQUE: Multidetector CT imaging of the neck was performed following the standard protocol without intravenous contrast. COMPARISON:  None. FINDINGS: Pharynx and larynx: Normal. No mass or swelling. Salivary glands: No inflammation, mass, or stone. Thyroid: Negative Lymph nodes: No enlarged lymph nodes. Cluster of 4 submental lymph nodes are present bilaterally measuring between 4 and 7 mm. These are surrounded by soft tissue stranding in the fat extending to the skin surface. The skin is thickened in this area. The small lymph nodes have ill-defined margins and stranding in the adjacent fat. Vascular: Limited vascular evaluation without intravenous or contrast Limited intracranial: Negative Visualized orbits: Negative Mastoids and visualized paranasal sinuses: Mucosal edema paranasal sinuses. Cyst right maxillary sinus. Skeleton: Negative Upper chest: Lung apices clear bilaterally. Other: None IMPRESSION: There is skin thickening and soft tissue stranding below the chin in the midline. There are adjacent small submental lymph nodes in the area. Findings are most consistent with cellulitis and mild adenitis. No underlying mass identified. Electronically Signed   By: Franchot Gallo M.D.   On: 09/27/2020 12:01    Procedures Procedures   Medications Ordered in ED Medications  cephALEXin (KEFLEX) capsule 500 mg (500 mg Oral Given  09/27/20 1249)    ED Course  I have reviewed the triage vital signs and the nursing notes.  Pertinent labs & imaging results that were available during my care of the patient were reviewed by me and considered in my medical decision making (see chart for details).    MDM Rules/Calculators/A&P                          History provided by patient with additional history obtained from chart review.    Presenting with facial swelling.  Patient is afebrile, well-appearing.  She does have slightly elevated blood pressure 147/105. On exam she has tenderness to palpation of submental region with palpable possible swollen lymph node.  No overlying skin changes.  Normal thyroid.  CBC without leukocytosis, hemoglobin consistent with baseline.  BMP overall unremarkable.  CT soft tissue neck obtained to rule out abscess or severe infection.  I viewed imaging which shows possible cellulitis and mild adenitis.  I agree with radiologist impression.  Exam was performed without IV contrast due to national shortage. Discussed results with patient.  Will treat with Keflex for cellulitis.  Patient will need to follow-up closely with PCP for recheck.  She also needs to have her blood pressure rechecked as it was elevated today.  Thankfully her labs show no signs of endorgan damage.  Strict return precautions discussed.  Patient discharged home in stable condition.  She is agreeable with plan of care.    Portions of this note were generated with [employer]. Dictation errors may occur despite best  attempts at proofreading.   Final Clinical Impression(s) / ED Diagnoses Final diagnoses:  Cellulitis of head except face    Rx / DC Orders ED Discharge Orders         Ordered    cephALEXin (KEFLEX) 500 MG capsule  4 times daily        09/27/20 [address] 09/27/20 1252    Daleen Bo, MD 09/27/20 1759

## 2020-09-27 NOTE — ED Triage Notes (Signed)
Noticed jaw/ throat swelling since yesterday, a lump under her jaw, tender to palpation, pain w/ swallowing. Denies recent illness, fevers or dental issues.

## 2020-09-27 NOTE — Discharge Instructions (Addendum)
The CT scan today showed you have cellulitis which is a skin infection and swelling of the gland in your chin.  Your blood pressure was elevated today.  You should have this rechecked by your primary care doctor as well within 1 week.  You are being treated with Keflex.  This is an antibiotic.  You can also take ibuprofen to help with swelling and pain.  If that is not helping enough you can add in Tylenol.  After you finish the antibiotics you should follow-up with your primary care doctor to get rechecked and make sure that the swelling has improved.  If it worsens you should return to the ER

## 2020-09-27 NOTE — Progress Notes (Signed)
Virtual Visit via Video Note  I connected with [name] on 08/25/20 at  9:00 AM EDT by a video enabled telemedicine application and verified that I am speaking with the correct person using two identifiers.  Location: Patient: patient home Provider: clinical home office   At orientation to the IOP program, Case Managerdiscussed the limitations of evaluation and management by telemedicine and the availability of in person appointments. The patient expressed understanding and agreed to proceed with virtual visits throughout the duration of the program.  History of Present Illness: Adjustment Disorder   Observations/Objective: 9:00 - 10:00: Clinician led check-in regarding current stressors and situation.Clinician utilized active listening and empathetic response and validated patient emotions. Clinician facilitated processing group on pertinent issues.  Patient arrived within time allowed and reports that she is feeling "blah." Patient rates [facility] a scale of 1-10 with 10 being great. Pt reports yesterday was "okay" and she cleaned to keep herself distracted. Pt reports stress over looking for a new place to live. Pt denies SI/HI over the weekend. Pt able to process. Pt engaged in discussion.   10:00 - 11:00: Cln introduced topic of boundaries. Cln discussed how boundaries inform our relationships and affect self-esteem and personal agency. Group discussed the three types of boundaries: rigid, porous, and healthy and when each type is most helpful/harmful.  Pt engaged in discussion and reports having mostly rigid boundaries.    11:00 - 12:00: Cln continued topic of boundaries. Cln discussed the different ways boundaries present: physical, emotional, intellectual, sexual, material, and time. Group talked about the ways in which each type presents for them and is a struggle.  Pt engaged in discussion and reports most issue with emotional boundaries.   At check-out, cln  prompted group members to utilize skills and engage in self-care during their afternoon. Pt denies SI/HI and will follow safety plan should safety issues arise.   Assessment and Plan: Clinician recommends that Client remain in IOP treatment to better manage mental health symptoms, stabilization and to address treatment plan goals. Clinician recommends adherence to crisis/safety plan, taking medications as prescribed, and following up with medical professionals if any issues arise.  Follow Up Instructions: Clinician will send Webex link for next session. The Client was advised to call back or seek an in-person evaluation if the symptoms worsen or if the condition fails to improve as anticipated.     I provided 180 minutes of non-face-to-face time during this encounter.   Lorin Glass, LCSW

## 2020-10-20 ENCOUNTER — Other Ambulatory Visit: Payer: Self-pay | Admitting: Neurology

## 2020-11-19 ENCOUNTER — Other Ambulatory Visit: Payer: Self-pay | Admitting: Neurology

## 2020-11-19 ENCOUNTER — Other Ambulatory Visit: Payer: Self-pay

## 2020-11-19 ENCOUNTER — Ambulatory Visit (INDEPENDENT_AMBULATORY_CARE_PROVIDER_SITE_OTHER): Payer: BC Managed Care – PPO | Admitting: Neurology

## 2020-11-19 ENCOUNTER — Encounter: Payer: Self-pay | Admitting: Neurology

## 2020-11-19 VITALS — BP 138/91 | HR 88 | Ht 67.0 in | Wt 191.0 lb

## 2020-11-19 DIAGNOSIS — R519 Headache, unspecified: Secondary | ICD-10-CM

## 2020-11-19 DIAGNOSIS — R200 Anesthesia of skin: Secondary | ICD-10-CM

## 2020-11-19 DIAGNOSIS — G35 Multiple sclerosis: Secondary | ICD-10-CM | POA: Diagnosis not present

## 2020-11-19 DIAGNOSIS — M545 Low back pain, unspecified: Secondary | ICD-10-CM | POA: Diagnosis not present

## 2020-11-19 DIAGNOSIS — R202 Paresthesia of skin: Secondary | ICD-10-CM

## 2020-11-19 DIAGNOSIS — G8929 Other chronic pain: Secondary | ICD-10-CM

## 2020-11-19 MED ORDER — BUTALBITAL-APAP-CAFFEINE 50-325-40 MG PO TABS: 1.0000 | ORAL_TABLET | Freq: Four times a day (QID) | ORAL | 2 refills | Status: DC | PRN

## 2020-11-19 MED ORDER — ZONISAMIDE 100 MG PO CAPS: 100.0000 mg | ORAL_CAPSULE | Freq: Every day | ORAL | 11 refills | Status: DC

## 2020-11-19 NOTE — Progress Notes (Signed)
[facility]  PATIENT: [name] DOB: 20-Jul-1975  REFERRING DOCTOR OR PCP: Roland Rack, MD SOURCE: Patient, notes from recent hospital admission, imaging and laboratory reports, MRI images personally reviewed.  _________________________________   HISTORICAL  CHIEF COMPLAINT:  Chief Complaint  Patient presents with   Follow-up    Pt alone, rm 1. Pt describes having headaches. She has been noticing increased numbness intermittently in both upper and lower extremities. She has noticed a shooting pain in her back over the last couple months. takes Copaxone. At the injection site she has had increased redness and knots at site. States this is getting more often     HISTORY OF PRESENT ILLNESS:  [name] is a 45 y.o. woman with relapsing remitting multiple sclerosis.  Update 11/19/2020 She has been on glatiramer since 05/2020.   She notes nodules in her skin x 1 week after each injection that can be painful.    She notes no exacerbations.    She has numbness that shoots down the right side >> left side.  This happens more if she is active as in walking a longer distance.   She denies weakness or clumsiness.   She will somtimes feel dizzy, especially if she bends forward.    Vision is doing well.   Bladder function is fine.    She gets migraine headaches on a daily basis (30/30 days) > 4 hours a day. . Sumatriptan only helped a few times.   Fioricet sometimes helps.   Pain can be occipital or temporal.  .   She denies nausea or photophobia.   She hears a cracking sound in her neck before the HA starts.       MS history: She began experiencing right-sided paresthesias in the right arm and leg in November 2021.  Symptoms would come and go.  She had no weakness or gait difficulty.    She presented to the emergency room.  MRI of the brain showed some white matter foci.  This was followed up with an MRI of the December cervical spine that showed 2 foci, one  centrally at C2-C3 and 1 posteriorly to the right at C5-C6.   She continues to have episodes of random numbness with some tingling.    Sometimes she has symptoms shooting down her spine.     Imaging:  MRI of the brain 04/11/2020 showed multiple T2/FLAIR hyperintense foci in the periventricular, juxtacortical and deep white matter.  One focus in the right parietal lobe had subtle enhancement..  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  MRI of the cervical spine 04/13/2020 showed T2 hyperintense foci centrally adjacent to C2-C3, posteriorly to the right adjacent to C5-C6.  He had mild disc bulges and reversal of the cervical curvature.  Vitamin D is low at 13.  ANA, SSA/SSB are normal/negative.  SARS COVID 2 PCR was negative  Family history: Her daughter was diagnosed with MS diagnosed in November 2021 (presented with diplopia).  She is on Gilenya.   She also has a first cousin with MS.      REVIEW OF SYSTEMS: Constitutional: No fevers, chills, sweats, or change in appetite Eyes: No visual changes, double vision, eye pain Ear, nose and throat: No hearing loss, ear pain, nasal congestion, sore throat Cardiovascular: No chest pain, palpitations Respiratory:  No shortness of breath at rest or with exertion.   No wheezes GastrointestinaI: No nausea, vomiting, diarrhea, abdominal pain, fecal incontinence Genitourinary:  No dysuria, urinary retention or frequency.  No nocturia. Musculoskeletal:  No neck pain, back pain Integumentary: No rash, pruritus, skin lesions Neurological: as above Psychiatric: No depression at this time.  No anxiety Endocrine: No palpitations, diaphoresis, change in appetite, change in weigh or increased thirst Hematologic/Lymphatic:  No anemia, purpura, petechiae. Allergic/Immunologic: No itchy/runny eyes, nasal congestion, recent allergic reactions, rashes  ALLERGIES: Allergies  Allergen Reactions   Pork-Derived Products      Religous beliefs. Ok with lovenox.    HOME MEDICATIONS:  Current Outpatient Medications:    aspirin 325 MG EC tablet, Take 650 mg by mouth every 6 (six) hours as needed for pain., Disp: , Rfl:    cholecalciferol (VITAMIN D3) 25 MCG (1000 UNIT) tablet, Take 1 tablet (1,000 Units total) by mouth daily. To start after weekly Ergocalciferol is completed, Disp: 30 tablet, Rfl: 1   Glatiramer Acetate 40 MG/ML SOSY, Inject into the skin., Disp: , Rfl:    meloxicam (MOBIC) 7.5 MG tablet, Take 1 tablet (7.5 mg total) by mouth daily., Disp: 30 tablet, Rfl: 11   Vitamin D, Ergocalciferol, (DRISDOL) 1.25 MG (50000 UNIT) CAPS capsule, Take 1 capsule (50,000 Units total) by mouth every 7 (seven) days., Disp: 13 capsule, Rfl: 1   butalbital-acetaminophen-caffeine (FIORICET) 50-325-40 MG tablet, Take 1 tablet by mouth every 6 (six) hours as needed for headache. No more than 5/week, Disp: 20 tablet, Rfl: 2   zonisamide (ZONEGRAN) 100 MG capsule, Take 1 capsule (100 mg total) by mouth daily., Disp: 30 capsule, Rfl: 11  PAST MEDICAL HISTORY: Past Medical History:  Diagnosis Date   Anxiety    BMI 30.0-30.9,adult    Depression    Uterine fibroid     PAST SURGICAL HISTORY: Past Surgical History:  Procedure Laterality Date   ABDOMINAL HYSTERECTOMY     2010   CESAREAN SECTION     x 2    FAMILY HISTORY: Family History  Problem Relation Age of Onset   Cancer Mother    HIV Father    Multiple sclerosis Daughter    Multiple sclerosis Cousin     SOCIAL HISTORY:  Social History   Socioeconomic History   Marital status: Single    Spouse name: Not on file   Number of children: Not on file   Years of education: Not on file   Highest education level: Not on file  Occupational History   Not on file  Tobacco Use   Smoking status: Never   Smokeless tobacco: Never  Vaping Use   Vaping Use: Never used  Substance and Sexual Activity   Alcohol use: No   Drug use: No   Sexual activity: Not on file   Other Topics Concern   Not on file  Social History Narrative   Right handed   1 cup coffee per day   Social Determinants of Health   Financial Resource Strain: Not on file  Food Insecurity: Not on file  Transportation Needs: Not on file  Physical Activity: Not on file  Stress: Not on file  Social Connections: Not on file  Intimate Partner Violence: Not on file     PHYSICAL EXAM  Vitals:   11/19/20 1422  BP: (!) 138/91  Pulse: 88  Weight: 191 lb (86.6 kg)  Height: 5\' 7"  (1.702 m)    Body mass index is 29.91 kg/m.   General: The patient is well-developed and well-nourished and in no acute distress  HEENT:  Head is [place]/AT.  Sclera are anicteric.  Skin: Extremities are without rash or  edema.  Neurologic Exam  Mental status: The patient is alert and oriented x 3 at the time of the examination. The patient has apparent normal recent and remote memory, with an apparently normal attention span and concentration ability.   Speech is normal.  Cranial nerves: Extraocular movements are full.  Facial strength and sensation was normal.. No obvious hearing deficits are noted.  Motor:  Muscle bulk is normal.   Tone is normal. Strength is  5 / 5 in all 4 extremities.   Sensory: Sensory testing is intact to pinprick, soft touch and vibration sensation in all 4 extremities.  Coordination: Cerebellar testing reveals good finger-nose-finger and heel-to-shin bilaterally.  Gait and station: Station is normal.  Gait is normal.  Tandem gait is mildly wide.. Romberg is negative.   Reflexes: Deep tendon reflexes are symmetric and normal bilaterally.   Plantar responses are flexor.    DIAGNOSTIC DATA (LABS, IMAGING, TESTING) - I reviewed patient records, labs, notes, testing and imaging myself where available.  Lab Results  Component Value Date   WBC 5.1 09/27/2020   HGB 11.3 (L) 09/27/2020   HCT 38.1 09/27/2020   MCV 73.8 (L) 09/27/2020   PLT 289 09/27/2020      Component  Value Date/Time   NA 138 09/27/2020 1050   K 3.8 09/27/2020 1050   CL 106 09/27/2020 1050   CO2 29 09/27/2020 1050   GLUCOSE 97 09/27/2020 1050   BUN 9 09/27/2020 1050   CREATININE 0.65 09/27/2020 1050   CALCIUM 8.9 09/27/2020 1050   PROT 6.5 04/17/2020 1119   ALBUMIN 3.3 (L) 04/17/2020 1119   AST 18 04/17/2020 1119   ALT 29 04/17/2020 1119   ALKPHOS 47 04/17/2020 1119   BILITOT 0.3 04/17/2020 1119   GFRNONAA >60 09/27/2020 1050   GFRAA >60 07/13/2017 0339   Lab Results  Component Value Date   CHOL 118 04/11/2020   HDL 37 (L) 04/11/2020   LDLCALC 72 04/11/2020   TRIG 43 04/11/2020   CHOLHDL 3.2 04/11/2020   Lab Results  Component Value Date   HGBA1C 5.9 (H) 04/11/2020   Lab Results  Component Value Date   VITAMINB12 316 04/12/2020   No results found for: TSH     ASSESSMENT AND PLAN  Multiple sclerosis (HCC)  Numbness and tingling of right arm and leg  Chronic bilateral low back pain without sciatica  Chronic nonintractable headache, unspecified headache type     Continue glatiramer.   Around time of next visit, check MRI to determine if subclinical progression and consider a switch in DMT if occurring.  Also consider a switch if the skin reactions worsen Zonisamid 100 mg po qHS for HA prophylaxis Fioricet prn   no more than 5/week Return in 6 months or sooner if there are new or worsening neurologic symptoms.  [name] A. Felecia Shelling, MD, [facility] 12/01/3662, 4:03 PM Certified in Neurology, Clinical Neurophysiology, Sleep Medicine and Neuroimaging  [facility] [address], [place], [place] [address] [phone]

## 2020-11-23 DIAGNOSIS — Z0289 Encounter for other administrative examinations: Secondary | ICD-10-CM

## 2021-03-06 ENCOUNTER — Emergency Department (HOSPITAL_COMMUNITY)
Admission: EM | Admit: 2021-03-06 | Discharge: 2021-03-06 | Disposition: A | Discharge status: Home / Self Care | Payer: BC Managed Care – PPO

## 2021-03-30 ENCOUNTER — Other Ambulatory Visit: Payer: Self-pay | Admitting: Neurology

## 2021-05-13 ENCOUNTER — Telehealth: Payer: Self-pay | Admitting: *Deleted

## 2021-05-13 NOTE — Telephone Encounter (Signed)
Received fax from [place] that [place] approved 05/13/21-05/13/22.

## 2021-05-13 NOTE — Telephone Encounter (Signed)
Faxed completed/signed PA form back to [facility] at [phone]. Received fax confirmation. Waiting on determination.

## 2021-05-19 ENCOUNTER — Other Ambulatory Visit: Payer: Self-pay

## 2021-05-19 ENCOUNTER — Other Ambulatory Visit: Payer: Self-pay | Admitting: Neurology

## 2021-05-19 ENCOUNTER — Ambulatory Visit (INDEPENDENT_AMBULATORY_CARE_PROVIDER_SITE_OTHER): Payer: BC Managed Care – PPO | Admitting: Family Medicine

## 2021-05-19 ENCOUNTER — Encounter: Payer: Self-pay | Admitting: Family Medicine

## 2021-05-19 VITALS — BP 143/89 | HR 80 | Ht 67.5 in | Wt 210.0 lb

## 2021-05-19 DIAGNOSIS — M545 Low back pain, unspecified: Secondary | ICD-10-CM | POA: Diagnosis not present

## 2021-05-19 DIAGNOSIS — E559 Vitamin D deficiency, unspecified: Secondary | ICD-10-CM

## 2021-05-19 DIAGNOSIS — R200 Anesthesia of skin: Secondary | ICD-10-CM

## 2021-05-19 DIAGNOSIS — R519 Headache, unspecified: Secondary | ICD-10-CM

## 2021-05-19 DIAGNOSIS — R202 Paresthesia of skin: Secondary | ICD-10-CM

## 2021-05-19 DIAGNOSIS — G35 Multiple sclerosis: Secondary | ICD-10-CM | POA: Diagnosis not present

## 2021-05-19 DIAGNOSIS — G8929 Other chronic pain: Secondary | ICD-10-CM

## 2021-05-19 MED ORDER — MELOXICAM 7.5 MG PO TABS: 7.5000 mg | ORAL_TABLET | Freq: Every day | ORAL | 11 refills | Status: DC

## 2021-05-19 MED ORDER — SUMATRIPTAN SUCCINATE 100 MG PO TABS: 100.0000 mg | ORAL_TABLET | Freq: Once | ORAL | 11 refills | Status: DC | PRN

## 2021-05-19 NOTE — Patient Instructions (Signed)
Below is our plan:  We will continue glatiramer injections. We will continue sumatriptan and meloxicam as needed. Please take 1 tablet at onset of headache. May take sumatriptan 1 additional tablet in 2 hours if needed. Do not take more than 2 tablets in 24 hours or more than 10 in a month.   Please make sure you are staying well hydrated. I recommend 50-60 ounces daily. Well balanced diet and regular exercise encouraged. Consistent sleep schedule with 6-8 hours recommended.   Please continue follow up with care team as directed.   Follow up with Dr Felecia Shelling in 6 months   You may receive a survey regarding today's visit. I encourage you to leave honest feed back as I do use this information to improve patient care. Thank you for seeing me today!

## 2021-05-19 NOTE — Progress Notes (Signed)
Chief Complaint  Patient presents with   Follow-up    Pt alone, rm 16. Pt here for MS follow up overall states that things are well and there are no concerns.     HISTORY OF PRESENT ILLNESS: 05/19/21 ALL: [name] is a 46 y.o. female here today for follow up for RRMS. She continues glatiramer injections.   She feels that symptoms are about the same. No new or worsening symptoms. No weakness. She uses She continues to have intermittent numbness/tingling of right upper and lower extremity. Shooting pains at times. She feels that injections cause more injection site pain in legs than in arms. She has waxing and waning back pain. Meloxicam helps.   Dr Felecia Shelling started zonisamide 100mg  daily for concerns of headaches mentioned at last visit 11/2020. She was scared to take due to listed side effects. She feels that migraines are fairly well managed on acute meds.   She continues vitamin D 50,000iu weekly. Last vit D 36 07/2020.    HISTORY (copied from Dr Garth Bigness previous note)  [name] is a 46 y.o. woman with relapsing remitting multiple sclerosis.   Update 11/19/2020 She has been on glatiramer since 05/2020.   She notes nodules in her skin x 1 week after each injection that can be painful.    She notes no exacerbations.    She has numbness that shoots down the right side >> left side.  This happens more if she is active as in walking a longer distance.   She denies weakness or clumsiness.   She will somtimes feel dizzy, especially if she bends forward.    Vision is doing well.   Bladder function is fine.     She gets migraine headaches on a daily basis (30/30 days) > 4 hours a day. . Sumatriptan only helped a few times.   Fioricet sometimes helps.   Pain can be occipital or temporal.  .   She denies nausea or photophobia.   She hears a cracking sound in her neck before the HA starts.        MS history: She began experiencing right-sided paresthesias in the right arm and leg in  November 2021.  Symptoms would come and go.  She had no weakness or gait difficulty.    She presented to the emergency room.  MRI of the brain showed some white matter foci.  This was followed up with an MRI of the December cervical spine that showed 2 foci, one centrally at C2-C3 and 1 posteriorly to the right at C5-C6.   She continues to have episodes of random numbness with some tingling.    Sometimes she has symptoms shooting down her spine.      Imaging:  MRI of the brain 04/11/2020 showed multiple T2/FLAIR hyperintense foci in the periventricular, juxtacortical and deep white matter.  One focus in the right parietal lobe had subtle enhancement..  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   MRI of the cervical spine 04/13/2020 showed T2 hyperintense foci centrally adjacent to C2-C3, posteriorly to the right adjacent to C5-C6.  He had mild disc bulges and reversal of the cervical curvature.   Vitamin D is low at 13.  ANA, SSA/SSB are normal/negative.  SARS COVID 2 PCR was negative   Family history: Her daughter was diagnosed with MS diagnosed in November 2021 (presented with diplopia).  She is on Gilenya.   She also has a first cousin  with MS.    REVIEW OF SYSTEMS: Out of a complete 14 system review of symptoms, the patient complains only of the following symptoms, low back pain, headaches, numbness, tingling and all other reviewed systems are negative.   ALLERGIES: Allergies  Allergen Reactions   Pork-Derived Products     Religous beliefs. Ok with lovenox.     HOME MEDICATIONS: Outpatient Medications Prior to Visit  Medication Sig Dispense Refill   aspirin 325 MG EC tablet Take 650 mg by mouth every 6 (six) hours as needed for pain.     cholecalciferol (VITAMIN D3) 25 MCG (1000 UNIT) tablet Take 1 tablet (1,000 Units total) by mouth daily. To start after weekly Ergocalciferol is completed 30 tablet 1   Glatiramer Acetate 40 MG/ML SOSY INJECT ONE  SYRINGE SUBCUTANEOUSLY 3 TIMES A WEEK AT LEAST 48 HOURS APART. ALLOW TO WARM TO ROOM TEMP FOR 20 MINUTES. REFRIGERATE. 36 mL 3   Vitamin D, Ergocalciferol, (DRISDOL) 1.25 MG (50000 UNIT) CAPS capsule TAKE 1 CAPSULE (50,000 UNITS TOTAL) BY MOUTH EVERY 7 (SEVEN) DAYS 13 capsule 1   butalbital-acetaminophen-caffeine (FIORICET) 50-325-40 MG tablet Take 1 tablet by mouth every 6 (six) hours as needed for headache. No more than 5/week 20 tablet 2   meloxicam (MOBIC) 7.5 MG tablet Take 1 tablet (7.5 mg total) by mouth daily. 30 tablet 11   zonisamide (ZONEGRAN) 100 MG capsule Take 1 capsule (100 mg total) by mouth daily. 30 capsule 11   No facility-administered medications prior to visit.     PAST MEDICAL HISTORY: Past Medical History:  Diagnosis Date   Anxiety    BMI 30.0-30.9,adult    Depression    Uterine fibroid      PAST SURGICAL HISTORY: Past Surgical History:  Procedure Laterality Date   ABDOMINAL HYSTERECTOMY     2010   CESAREAN SECTION     x 2     FAMILY HISTORY: Family History  Problem Relation Age of Onset   Cancer Mother    HIV Father    Multiple sclerosis Daughter    Multiple sclerosis Cousin      SOCIAL HISTORY: Social History   Socioeconomic History   Marital status: Single    Spouse name: Not on file   Number of children: Not on file   Years of education: Not on file   Highest education level: Not on file  Occupational History   Not on file  Tobacco Use   Smoking status: Never   Smokeless tobacco: Never  Vaping Use   Vaping Use: Never used  Substance and Sexual Activity   Alcohol use: No   Drug use: No   Sexual activity: Not on file  Other Topics Concern   Not on file  Social History Narrative   Right handed   1 cup coffee per day   Social Determinants of Health   Financial Resource Strain: Not on file  Food Insecurity: Not on file  Transportation Needs: Not on file  Physical Activity: Not on file  Stress: Not on file  Social  Connections: Not on file  Intimate Partner Violence: Not on file      PHYSICAL EXAM  Vitals:   05/19/21 1321  BP: (!) 143/89  Pulse: 80  Weight: 210 lb (95.3 kg)  Height: 5' 7.5" (1.715 m)    Body mass index is 32.41 kg/m.   Generalized: Well developed, in no acute distress  Cardiology: normal rate and rhythm, no murmur auscultated  Respiratory: clear to auscultation bilaterally  Neurological examination  Mentation: Alert oriented to time, place, history taking. Follows all commands speech and language fluent Cranial nerve II-XII: Pupils were equal round reactive to light. Extraocular movements were full, visual field were full on confrontational test. Facial sensation and strength were normal. Head turning and shoulder shrug  were normal and symmetric. Motor: The motor testing reveals 5 over 5 strength of all 4 extremities. Good symmetric motor tone is noted throughout.  Sensory: Sensory testing is intact to soft touch on all 4 extremities. No evidence of extinction is noted.  Coordination: Cerebellar testing reveals good finger-nose-finger and heel-to-shin bilaterally.  Gait and station: Gait is normal.  Reflexes: Deep tendon reflexes are symmetric and normal bilaterally.     DIAGNOSTIC DATA (LABS, IMAGING, TESTING) - I reviewed patient records, labs, notes, testing and imaging myself where available.  Lab Results  Component Value Date   WBC 5.1 09/27/2020   HGB 11.3 (L) 09/27/2020   HCT 38.1 09/27/2020   MCV 73.8 (L) 09/27/2020   PLT 289 09/27/2020      Component Value Date/Time   NA 138 09/27/2020 1050   K 3.8 09/27/2020 1050   CL 106 09/27/2020 1050   CO2 29 09/27/2020 1050   GLUCOSE 97 09/27/2020 1050   BUN 9 09/27/2020 1050   CREATININE 0.65 09/27/2020 1050   CALCIUM 8.9 09/27/2020 1050   PROT 6.5 04/17/2020 1119   ALBUMIN 3.3 (L) 04/17/2020 1119   AST 18 04/17/2020 1119   ALT 29 04/17/2020 1119   ALKPHOS 47 04/17/2020 1119   BILITOT 0.3  04/17/2020 1119   GFRNONAA >60 09/27/2020 1050   GFRAA >60 07/13/2017 0339   Lab Results  Component Value Date   CHOL 118 04/11/2020   HDL 37 (L) 04/11/2020   LDLCALC 72 04/11/2020   TRIG 43 04/11/2020   CHOLHDL 3.2 04/11/2020   Lab Results  Component Value Date   HGBA1C 5.9 (H) 04/11/2020   Lab Results  Component Value Date   VITAMINB12 316 04/12/2020   No results found for: TSH  No flowsheet data found.   ASSESSMENT AND PLAN  46 y.o. year old female  has a past medical history of Anxiety, BMI 30.0-30.9,adult, Depression, and Uterine fibroid. here with   Multiple sclerosis ([facility]) - Plan: Creatinine, Serum, MR BRAIN W WO CONTRAST, MR CERVICAL SPINE W WO CONTRAST  Numbness and tingling of right arm and leg  Chronic bilateral low back pain without sciatica  Chronic nonintractable headache, unspecified headache type  Vitamin D deficiency - Plan: Vitamin D, 25-hydroxy  [name] is doing fairly well. She continues to have intermittent symptoms but, fortunately, no new symptoms. I will update MRI to ensure no progression. We will check labs, today. She will continue glatiramer injections as prescribed. She is hesitant to consider oral or infusion meds due to potential side effects. She will continue sumatriptan and meloxicam as needed for headaches and back pain. Migraine prevention medications discussed but she does not want to start a daily med. She will follow up with Dr Felecia Shelling in 6 months. She will call sooner as needed.  She verbalizes understanding and agreement with this plan.  Orders Placed This Encounter  Procedures   MR BRAIN W WO CONTRAST    Standing Status:   Future    Standing Expiration Date:   05/19/2022    Order Specific Question:   If indicated for the ordered procedure, I authorize the administration of contrast media per Radiology protocol    Answer:  Yes    Order Specific Question:   What is the patient's sedation requirement?    Answer:   No Sedation     Order Specific Question:   Does the patient have a pacemaker or implanted devices?    Answer:   No    Order Specific Question:   Preferred imaging location?    Answer:   Internal   MR CERVICAL SPINE W WO CONTRAST    Standing Status:   Future    Standing Expiration Date:   05/19/2022    Order Specific Question:   If indicated for the ordered procedure, I authorize the administration of contrast media per Radiology protocol    Answer:   Yes    Order Specific Question:   What is the patient's sedation requirement?    Answer:   No Sedation    Order Specific Question:   Does the patient have a pacemaker or implanted devices?    Answer:   No    Order Specific Question:   Preferred imaging location?    Answer:   Internal   Vitamin D, 25-hydroxy   Creatinine, Serum    Stacy Sailer, MSN, FNP-C 05/19/2021, 3:07 PM  [facility] [address], [place], [place] [address] [phone]

## 2021-05-20 LAB — VITAMIN D 25 HYDROXY (VIT D DEFICIENCY, FRACTURES): Vit D, 25-Hydroxy: 26.9 ng/mL — ABNORMAL LOW (ref 30.0–100.0)

## 2021-05-20 LAB — CREATININE, SERUM
Creatinine, Ser: 0.64 mg/dL (ref 0.57–1.00)
eGFR: 112 mL/min/{1.73_m2} (ref >59)

## 2021-05-25 ENCOUNTER — Telehealth: Payer: Self-pay | Admitting: *Deleted

## 2021-05-25 NOTE — Telephone Encounter (Signed)
Lab results sent to pt via mychart on 05/20/21: "Good morning, [name]. Your labs look ok. Your vitamin D is a little low. I would like for you to take vitamin D 5000iu daily. You can find this at any local pharmacy over the counter. Let me know if you need me."  Called and LVM for pt about results. Advised her to call back if she has any further questions.

## 2021-05-27 ENCOUNTER — Telehealth: Payer: Self-pay | Admitting: Family Medicine

## 2021-05-27 NOTE — Telephone Encounter (Signed)
MR Brain w/wo contrast & MR Cervical spine w/wo contrast Debbora Presto, NP [facility]: [phone] (exp. 05/27/21 to 07/25/21). Patient is scheduled at [facility] for 06/01/21.

## 2021-06-01 ENCOUNTER — Other Ambulatory Visit: Payer: Self-pay

## 2021-06-01 ENCOUNTER — Ambulatory Visit: Payer: BC Managed Care – PPO

## 2021-06-01 DIAGNOSIS — G35 Multiple sclerosis: Secondary | ICD-10-CM | POA: Diagnosis not present

## 2021-06-01 MED ORDER — GADOBENATE DIMEGLUMINE 529 MG/ML IV SOLN
20.0000 mL | Freq: Once | INTRAVENOUS | Status: AC | PRN
  Administered 2021-06-01: 10:00:00 20 mL via INTRAVENOUS

## 2021-06-16 ENCOUNTER — Telehealth: Payer: Self-pay | Admitting: *Deleted

## 2021-06-16 DIAGNOSIS — Z0289 Encounter for other administrative examinations: Secondary | ICD-10-CM

## 2021-06-16 NOTE — Telephone Encounter (Signed)
Gave completed/signed form back to medical records to process for pt. 

## 2021-06-21 ENCOUNTER — Telehealth (HOSPITAL_COMMUNITY): Payer: Self-pay | Admitting: Psychiatry

## 2021-06-21 NOTE — Telephone Encounter (Signed)
D:  Pt called to inquire about returning to [place].  Pt was last in MH-IOP April 2022.  Reports continued stressors as last year.  A:  Re-oriented pt.  Encouraged pt to verify her benefits.  Pt will start [employer] on 06-23-21.  Pt states she will start short term disability claim.  R:  Pt receptive.

## 2021-06-23 ENCOUNTER — Other Ambulatory Visit (HOSPITAL_COMMUNITY): Payer: BC Managed Care – PPO | Attending: Family | Admitting: Family

## 2021-06-23 ENCOUNTER — Other Ambulatory Visit: Payer: Self-pay

## 2021-06-23 ENCOUNTER — Encounter (HOSPITAL_COMMUNITY): Payer: Self-pay | Admitting: Psychiatry

## 2021-06-23 DIAGNOSIS — Z6379 Other stressful life events affecting family and household: Secondary | ICD-10-CM | POA: Insufficient documentation

## 2021-06-23 DIAGNOSIS — F4323 Adjustment disorder with mixed anxiety and depressed mood: Secondary | ICD-10-CM | POA: Insufficient documentation

## 2021-06-23 NOTE — Progress Notes (Signed)
Virtual Visit via Video Note  I connected with [name] on _0 @ at  9:00 AM EST by a video enabled telemedicine application and verified that I am speaking with the correct person using two identifiers.  Location: Patient: at home Provider: at office   I discussed the limitations of evaluation and management by telemedicine and the availability of in person appointments. The patient expressed understanding and agreed to proceed.  I discussed the assessment and treatment plan with the patient. The patient was provided an opportunity to ask questions and all were answered. The patient agreed with the plan and demonstrated an understanding of the instructions.   The patient was advised to call back or seek an in-person evaluation if the symptoms worsen or if the condition fails to improve as anticipated.  I provided 30 minutes of non-face-to-face time during this encounter.   Dellia Nims, M.Ed,CNA   Patient ID: [name], female   DOB: July 27, 1975, 46 y.o.   MRN: [id number] As per previous CCA from 08-11-20 states:  "This is a 46 yr old engaged, employed, [place] American female who was referred per a previous pt; d/t worsening depressive and anxiety symptoms.  Denies SI/HI or A/V hallucinations.  Reports struggling with symptoms for 3-4 months before they started worsening recently.  Triggers/Stressors:  1) In Sept 2021 pt's 74 yr old daughter was dx'd with MS.  During that time, according to pt, her whold household had COVID-19.  2) Medical Issue:  In Nov. 2021, pt was dx'd with MS.  3) Job ([employer]) of three yrs.  According to pt, she hasn't been able to meet her quota d/t unrealistic expectations.  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  4) No support system since her mother passed in 2017.  Pt denies any prior psychiatric hospitalizations or suicide attempts/gestures.  States she hasn't ever seen any behavioral health providers before.  Family hx:  Father and P-Uncles (Drugs/ETOH)    Current Symptoms/Problems: Panic attacks (2-3 X per week:  at night), sadness, increased anxiety, decreased appetite (lost 30 lbs within one month), flucuating sleep (difficult to get back to sleep), poor energy, decreased concentration, irritable, c/o hair falling out, anhedonia, isolative"   Patient requested to return to MH-IOP d/t worsening depressive/anxiety symptoms.  C/O same previous stressors in addition to:  1) daughter who continues to struggle with MS is now struggling with SI now.  "I have to leave my job periodically to pick her up from school."  2) Housing:  Pt states she has five kids and 4 grandkids.  "This morning my youngest daughter just stated that she and her kids have been kicked out of their home, so they are moving in with me." Pt reports that she will have nine other people living in her home.  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  "I have problems with the way they want [place] to sale to people." Pt denies SI/HI or A/V hallucinations.  A:  Re-oriented pt.  Pt gave verbal consent for treatment, release chart information to referred providers and to complete any forms if needed.  Pt also gave consent for attending group virtually d/t COVID-19 social distancing restrictions.  Encouraged support groups thru [employer].  Will refer pt to a therapist and psychiatrist since she didn't f/u with previous aftercare ([facility]). According to pt, she met a few times with her pastor.   Pt will improve her  mood as evidenced by being happy again, managing her mood and coping with her daily stressors for 5 out of 7 days for 60 days.  R:  Patient receptive.  Dellia Nims, M.Ed,CNA

## 2021-06-23 NOTE — Progress Notes (Signed)
Virtual Visit via Video Note   I connected with [name] on 06/23/21 at  9:00 AM EDT by a video enabled telemedicine application and verified that I am speaking with the correct person using two identifiers.   At orientation to the IOP program, Case Manager discussed the limitations of evaluation and management by telemedicine and the availability of in person appointments. The patient expressed understanding and agreed to proceed with virtual visits throughout the duration of the program.   Location:  Patient: Patient Home Provider: [facility] [place] Office   History of Present Illness: Adjustment disorder with mixed anxiety and depressed mood   Observations/Objective: Check In: Case Manager checked in with all participants to review discharge dates, insurance authorizations, work-related documents and needs from the treatment team regarding medications. Thi stated needs and engaged in discussion.    Initial Therapeutic Activity: Counselor facilitated a check-in with Eugina to assess for safety, sobriety and medication compliance.  Counselor also inquired about Madalena's current emotional ratings, as well as any significant changes in thoughts, feelings or behavior since previous check in.  Ryane presented for session on time and was alert, oriented x5, with no evidence or self-report of active SI/HI or A/V H.  Hatsumi reported compliance with medication and denied use of alcohol or illicit substances.  Aracelly reported scores of 5/10 for depression, 0/10 for anxiety, and 7/10 for anger/irritability.  Amyla denied any recent outbursts or panic attacks.  Harris reported that a recent struggle has been dealing with stress from her job, along with sharing a small space at home with 9 total people, including 5 children.  Mertha reported that she has also been diagnosed with MS, and noticed that excessive stress can cause flareups.  Khalidah reported that a recent success was making the decision to  enter group therapy again to better cope with these challenges.      Second Therapeutic Activity: Counselor introduced Oleta Mouse, [employer], to provide psychoeducation on topic of nutrition with members today.  Anda Kraft virtually shared a comprehensive PowerPoint presentation to guide discussion, which featured the various components of healthy living that influence one's wellbeing, including practicing mindfulness, staying physically active, calm in mood, well-rested, and more.  Anda Kraft explained how good nutrition reinforces positive physical and mental health, and shared a video which explained how food intake affects brain functioning in particular.  Anda Kraft provided advice on how to adjust diet in order to promote wellbeing during course of treatment, including achieving balanced daily intake along with regular exercise.  Anda Kraft offered the 'plate method' as a tool for proper distribution of protein, grains, starches, vegetables, fruit, and low calorie drink, in addition to concept of 'mindful eating', and covering current Dietary Guidelines for Americans for more tips.  Anda Kraft inquired about changes members would like to make to their nutrition in order to increase overall wellbeing based upon information shared today, and time was allowed to ask any questions they might have of Anda Kraft regarding her specialty.  Intervention effectiveness was mixed, as Briselda did not participate in initial discussion or stretching activity offered by Anda Kraft, and was observed talking to someone on a separate phone call through much of presentation.  Nirel did not answer counselor when a private message was sent to her addressing this behavior, but did ask the speaker a question at end of group regarding ways to maintain a healthy diet when there is a lack of appetite due to depression.  Case manager was informed of Kanai's mixed engagement.  Assessment and Plan: Counselor recommends that Windie remain in IOP treatment to  better manage mental health symptoms, ensure stability and pursue completion of treatment plan goals. Counselor recommends adherence to crisis/safety plan, taking medications as prescribed, and following up with medical professionals if any issues arise.   Follow Up Instructions: Counselor will send Webex link for next session. Phil was advised to call back or seek an in-person evaluation if the symptoms worsen or if the condition fails to improve as anticipated.   I provided 180 minutes of non-face-to-face time during this encounter.   Shade Flood, LCSW, LCAS 06/23/21

## 2021-06-23 NOTE — Plan of Care (Signed)
Pt will improve her mood as evidenced by being happy again, managing her mood and coping with daily stressors by way of setting boundaries for 5 our of 7 days for 60 days.

## 2021-06-24 ENCOUNTER — Other Ambulatory Visit: Payer: Self-pay

## 2021-06-24 ENCOUNTER — Other Ambulatory Visit (HOSPITAL_COMMUNITY): Payer: BC Managed Care – PPO | Admitting: Licensed Clinical Social Worker

## 2021-06-24 DIAGNOSIS — F4323 Adjustment disorder with mixed anxiety and depressed mood: Secondary | ICD-10-CM

## 2021-06-24 DIAGNOSIS — Z6379 Other stressful life events affecting family and household: Secondary | ICD-10-CM | POA: Diagnosis not present

## 2021-06-24 NOTE — Progress Notes (Addendum)
Virtual Visit via Video Note  I connected with [name] on 06/24/21 at  9:00 AM EST by a video enabled telemedicine application and verified that I am speaking with the correct person using two identifiers.  Location: Patient: Home Provider: Office   I discussed the limitations of evaluation and management by telemedicine and the availability of in person appointments. The patient expressed understanding and agreed to proceed.    I discussed the assessment and treatment plan with the patient. The patient was provided an opportunity to ask questions and all were answered. The patient agreed with the plan and demonstrated an understanding of the instructions.   The patient was advised to call back or seek an in-person evaluation if the symptoms worsen or if the condition fails to improve as anticipated.  I provided 15 minutes of non-face-to-face time during this encounter.   Derrill Center, NP    Psychiatric Initial Adult Assessment   Patient Identification: [name] MRN:  [id number] Date of Evaluation:  06/24/2021 Referral Source: Previously completed program, self-referral Chief Complaint:   Chief Complaint   Depression; Anxiety; Stress    Visit Diagnosis:    ICD-10-CM   1. Adjustment disorder with mixed anxiety and depressed mood  F43.23       History of Present Illness:  [name] is a 46 year old female who presents for ongoing symptoms of worsening depression and anxiety.  She reports she continues to struggle with multiple stressors related to family, grief and loss and employment.  States she is still employed by [employer] however due to demanding work load and higher expectations she became stressed and overwhelmed.  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  Reports she frequently has to be excused from work due to her daughter reporting suicidal ideations.  With the  possibility of her having to move back into her home.  It was reported that patient currently has 9 family members within the home.   She continues to deny suicidal or homicidal ideations.  Denies auditory or visual hallucinations.  No previous inpatient admissions.  Declined medication management at this time.  Patient to start intensive outpatient programming on 06/23/2021   Associated Signs/Symptoms: Depression Symptoms:  depressed mood, feelings of worthlessness/guilt, difficulty concentrating, anxiety, (Hypo) Manic Symptoms:  Irritable Mood, Anxiety Symptoms:  Excessive Worry, Social Anxiety, Psychotic Symptoms:  Hallucinations: None PTSD Symptoms: NA  Past Psychiatric History:   Previous Psychotropic Medications: No   Substance Abuse History in the last 12 months:  No.  Consequences of Substance Abuse: NA  Past Medical History:  Past Medical History:  Diagnosis Date   Anxiety    BMI 30.0-30.9,adult    Depression    Uterine fibroid     Past Surgical History:  Procedure Laterality Date   ABDOMINAL HYSTERECTOMY     2010   CESAREAN SECTION     x 2    Family Psychiatric History:   Family History:  Family History  Problem Relation Age of Onset   Cancer Mother    HIV Father    Multiple sclerosis Daughter    Multiple sclerosis Cousin     Social History:   Social History   Socioeconomic History   Marital status: Single    Spouse name: Not on file   Number of children: Not on file   Years of education: Not on file   Highest education level: Not on file  Occupational History   Not on file  Tobacco Use   Smoking status: Never   Smokeless tobacco: Never  Vaping Use   Vaping Use: Never used  Substance and Sexual Activity   Alcohol use: No   Drug use: No   Sexual activity: Not on file  Other Topics Concern   Not on file  Social History Narrative   Right handed   1 cup coffee per day   Social Determinants of Health   Financial Resource Strain: Not  on file  Food Insecurity: Not on file  Transportation Needs: Not on file  Physical Activity: Not on file  Stress: Not on file  Social Connections: Not on file    Additional Social History:   Allergies:   Allergies  Allergen Reactions   Pork-Derived Products     Religous beliefs. Ok with lovenox.    Metabolic Disorder Labs: Lab Results  Component Value Date   HGBA1C 5.9 (H) 04/11/2020   MPG 122.63 04/11/2020   No results found for: PROLACTIN Lab Results  Component Value Date   CHOL 118 04/11/2020   TRIG 43 04/11/2020   HDL 37 (L) 04/11/2020   CHOLHDL 3.2 04/11/2020   VLDL 9 04/11/2020   LDLCALC 72 04/11/2020   No results found for: TSH  Therapeutic Level Labs: No results found for: LITHIUM No results found for: CBMZ No results found for: VALPROATE  Current Medications: Current Outpatient Medications  Medication Sig Dispense Refill   aspirin 325 MG EC tablet Take 650 mg by mouth every 6 (six) hours as needed for pain.     cholecalciferol (VITAMIN D3) 25 MCG (1000 UNIT) tablet Take 1 tablet (1,000 Units total) by mouth daily. To start after weekly Ergocalciferol is completed (Patient taking differently: Take 5,000 Units by mouth daily. To start after weekly Ergocalciferol is completed) 30 tablet 1   Glatiramer Acetate 40 MG/ML SOSY INJECT ONE SYRINGE SUBCUTANEOUSLY 3 TIMES A WEEK AT LEAST 48 HOURS APART. ALLOW TO WARM TO ROOM TEMP FOR 20 MINUTES. REFRIGERATE. 36 mL 3   meloxicam (MOBIC) 7.5 MG tablet Take 1 tablet (7.5 mg total) by mouth daily. 30 tablet 11   SUMAtriptan (IMITREX) 100 MG tablet Take 1 tablet (100 mg total) by mouth once as needed for up to 1 dose for migraine. May repeat in 2 hours if headache persists or recurs. 10 tablet 11   Vitamin D, Ergocalciferol, (DRISDOL) 1.25 MG (50000 UNIT) CAPS capsule TAKE 1 CAPSULE (50,000 UNITS TOTAL) BY MOUTH EVERY 7 (SEVEN) DAYS 13 capsule 1   No current facility-administered medications for this visit.     Musculoskeletal: Strength & Muscle Tone: within normal limits Gait & Station:  N/A Patient leans: N/A  Psychiatric Specialty Exam: Review of Systems  Respiratory: Negative.    Cardiovascular: Negative.   Psychiatric/Behavioral:  Positive for decreased concentration. Negative for suicidal ideas.   All other systems reviewed and are negative.  There were no vitals taken for this visit.There is no height or weight on file to calculate BMI.  General Appearance: Casual  Eye Contact:  Good  Speech:  Clear and Coherent  Volume:  Normal  Mood:  Anxious and Depressed  Affect:  Congruent  Thought Process:  Coherent  Orientation:  Full (Time, Place, and Person)  Thought Content:  Logical and Rumination  Suicidal Thoughts:  No  Homicidal Thoughts:  No  Memory:  Immediate;   Fair Recent;   Fair  Judgement:  Fair  Insight:  Fair  Psychomotor Activity:  Normal  Concentration:  Concentration: Fair  Recall:  Good  Fund of Knowledge:Good  Language: Good  Akathisia:  No  Handed:  Right  AIMS (if indicated):    Assets:  Communication Skills Desire for Improvement Resilience Social Support  ADL's:  Intact  Cognition: WNL  Sleep:  Fair   Screenings: Web designer from 06/23/2021 in [place] Counselor from 08/12/2020 in [place]  PHQ-2 Total Score 4 5  PHQ-9 Total Score 13 11      Flowsheet Row Counselor from 06/23/2021 in [place] ED from 09/27/2020 in [place] DEPT Counselor from 08/12/2020 in [place] Error: Question 1 not populated No Risk Error: Question 6 not populated       Assessment and Plan:  Patient to start intensive outpatient programming (IOP) Patient to consider initiating an SSRI for depression and anxiety  Treatment plan was reviewed and agreed upon by NP T. Bobby Rumpf and patient  Beronica Lansdale  need for group services   Derrill Center, NP 2/16/20239:09 AM

## 2021-06-24 NOTE — Progress Notes (Signed)
Virtual Visit via Video Note   I connected with [name] on 06/24/21 at  9:00 AM EDT by a video enabled telemedicine application and verified that I am speaking with the correct person using two identifiers.   At orientation to the IOP program, Case Manager discussed the limitations of evaluation and management by telemedicine and the availability of in person appointments. The patient expressed understanding and agreed to proceed with virtual visits throughout the duration of the program.   Location:  Patient: Patient Home Provider: [facility] [place] Office   History of Present Illness: Adjustment disorder with mixed anxiety and depressed mood   Observations/Objective: Check In: Case Manager checked in with all participants to review discharge dates, insurance authorizations, work-related documents and needs from the treatment team regarding medications. [name] stated needs and engaged in discussion.    Initial Therapeutic Activity: Counselor facilitated a check-in with [name] to assess for safety, sobriety and medication compliance.  Counselor also inquired about [name]'s current emotional ratings, as well as any significant changes in thoughts, feelings or behavior since previous check in.  [name] presented for session on time and was alert, oriented x5, with no evidence or self-report of active SI/HI or A/V H.  [name] reported compliance with medication and denied use of alcohol or illicit substances.  [name] reported scores of 5/10 for depression, 0/10 for anxiety, and 4/10 for anger/irritability.  [name] denied any recent outbursts or panic attacks.  [name] reported that a struggle was trying to put up a desk yesterday after breaking her old one.  [name] stated I realized that it was a little over my head.  She reported that her goal is to stay busy today to avoid ruminating too much on the fact that today is the birthday of her deceased mother.      Second Therapeutic Activity:  Counselor introduced topic of assertive communication today.  Counselor shared various handouts with members virtually in group to read along with on the subject.  These handouts defined assertive communication as a communication style in which a person stands up for their own needs and wants, while also taking into consideration the needs and wants of others, without behaving in a passive or aggressive way.  Traits of assertive communicators were highlighted such as using appropriate speaking volume, maintaining eye contact, using confident language, and avoiding interruption.  Members were also provided with tips on how to improve communication, including respecting oneself, expressing thoughts and feelings calmly, and saying No when necessary.  Members were given a variety of scenarios where they could practice using these tips to respond in an assertive manner.  Intervention was effective, as evidenced by [name] participating in discussion on topic, and reporting that she feels confident that she has an assertive communication style, due to traits such as clearly stating her wants and needs, standing up for her rights, having confident tone and body language, as well as good eye contact.  [name] showed effective use of assertive communication skills through engagement in roleplay activities today.     Assessment and Plan: Counselor recommends that [name] remain in IOP treatment to better manage mental health symptoms, ensure stability and pursue completion of treatment plan goals. Counselor recommends adherence to crisis/safety plan, taking medications as prescribed, and following up with medical professionals if any issues arise.   Follow Up Instructions: Counselor will send Webex link for next session. Alton was advised to call back or seek an in-person evaluation if the symptoms worsen or if the condition  fails to improve as anticipated.  Collaboration of Care:   Medication Management AEB  [name], NP                                              Case Manager AEB Dellia Nims, CNA   Patient/Guardian was advised Release of Information must be obtained prior to any record release in order to collaborate their care with an outside provider. Patient/Guardian was advised if they have not already done so to contact the registration department to sign all necessary forms in order for [place] to release information regarding their care.   Consent: Patient/Guardian gives verbal consent for treatment and assignment of benefits for services provided during this visit. Patient/Guardian expressed understanding and agreed to proceed.  I provided 180 minutes of non-face-to-face time during this encounter.   Shade Flood, LCSW, LCAS 06/24/21

## 2021-06-25 ENCOUNTER — Other Ambulatory Visit: Payer: Self-pay

## 2021-06-25 ENCOUNTER — Other Ambulatory Visit (HOSPITAL_COMMUNITY): Payer: BC Managed Care – PPO | Admitting: Licensed Clinical Social Worker

## 2021-06-25 DIAGNOSIS — F4323 Adjustment disorder with mixed anxiety and depressed mood: Secondary | ICD-10-CM

## 2021-06-25 NOTE — Progress Notes (Signed)
Virtual Visit via Video Note   I connected with [name] on 06/25/21 at  9:00 AM EDT by a video enabled telemedicine application and verified that I am speaking with the correct person using two identifiers.   At orientation to the IOP program, Case Manager discussed the limitations of evaluation and management by telemedicine and the availability of in person appointments. The patient expressed understanding and agreed to proceed with virtual visits throughout the duration of the program.   Location:  Patient: Patient Home Provider: [facility] [place] [facility]   History of Present Illness: Adjustment disorder with mixed anxiety and depressed mood   Observations/Objective: Check In: Case Manager checked in with all participants to review discharge dates, insurance authorizations, work-related documents and needs from the treatment team regarding medications. [name] stated needs and engaged in discussion.    Initial Therapeutic Activity: Counselor facilitated a check-in with [name] to assess for safety, sobriety and medication compliance.  Counselor also inquired about [name]'s current emotional ratings, as well as any significant changes in thoughts, feelings or behavior since previous check in.  [name] presented for session on time and was alert, oriented x5, with no evidence or self-report of active SI/HI or A/V H.  [name] reported compliance with medication and denied use of alcohol or illicit substances.  [name] reported scores of 2/10 for depression, 0/10 for anxiety, and 4/10 for anger/irritability.  [name] denied any recent outbursts or panic attacks.  [name] reported that a recent struggle was having a disagreement with one of her daughters yesterday, stating She pissed me off so I hung up on her because she wouldn't listen to my advice.  [name] reported that she needs to set healthier boundaries with this daughter due to the impact it has upon her mood.  [name] reported that her goal is  to travel to [place] this weekend to see her husband.        Second Therapeutic Activity: Counselor introduced topic of anger management today.  Counselor virtually shared a handout with members on this subject featuring a variety of coping skills, and facilitated discussion on these approaches.  Examples included raising awareness of anger triggers, practicing deep breathing, keeping an anger log to better understand episodes, using diversion activities to distract oneself for 30 minutes, taking a time out when necessary, and being mindful of warning signs tied to thoughts or behavior.  Counselor inquired about which techniques group members have used before, what has proved to be helpful, what their unique warning signs might be, as well as what they will try out in the future to assist with de-escalation.  Intervention was effective, as evidenced by [name] participating in discussion on topic, reporting that she does not typically have issues with anger because she sets boundaries with people who stress her out and influence difficult feelings such as this.  [name] stated When I feel like something will upset me, I let it go, and put a boundary in place.  [name] reported that if she is around triggering people too long, it can cause her MS to flare up, so she is more mindful of warning signs like this to protect both mental and physical wellbeing.  [name] reported that this subject made her think more carefully about weighing the pros and cons of visiting her husband in [place] this weekend, stating We just started back talking again, and I dunno how to approach him.  [name] reported that she is also open to the idea of taking 'time outs' when necessary  to allow herself and others the chance to calm down before revisiting an issue again to seek resolution.    Assessment and Plan: Counselor recommends that [name] remain in IOP treatment to better manage mental health symptoms, ensure stability and  pursue completion of treatment plan goals. Counselor recommends adherence to crisis/safety plan, taking medications as prescribed, and following up with medical professionals if any issues arise.   Follow Up Instructions: Counselor will send Webex link for next session. [name] was advised to call back or seek an in-person evaluation if the symptoms worsen or if the condition fails to improve as anticipated.   Collaboration of Care:   Medication Management AEB [name], NP                                           Case Manager AEB Dellia Nims, CNA   Patient/Guardian was advised Release of Information must be obtained prior to any record release in order to collaborate their care with an outside provider. Patient/Guardian was advised if they have not already done so to contact the registration department to sign all necessary forms in order for [place] to release information regarding their care.   Consent: Patient/Guardian gives verbal consent for treatment and assignment of benefits for services provided during this visit. Patient/Guardian expressed understanding and agreed to proceed.  I provided 180 minutes of non-face-to-face time during this encounter.   Shade Flood, LCSW, LCAS 06/25/21

## 2021-06-28 ENCOUNTER — Other Ambulatory Visit: Payer: Self-pay

## 2021-06-28 ENCOUNTER — Other Ambulatory Visit (HOSPITAL_COMMUNITY): Payer: BC Managed Care – PPO | Admitting: Licensed Clinical Social Worker

## 2021-06-28 DIAGNOSIS — F4323 Adjustment disorder with mixed anxiety and depressed mood: Secondary | ICD-10-CM

## 2021-06-28 NOTE — Progress Notes (Signed)
Virtual Visit via Video Note   I connected with [name] on 06/28/21 at  9:00 AM EDT by a video enabled telemedicine application and verified that I am speaking with the correct person using two identifiers.   At orientation to the IOP program, Case Manager discussed the limitations of evaluation and management by telemedicine and the availability of in person appointments. The patient expressed understanding and agreed to proceed with virtual visits throughout the duration of the program.   Location:  Patient: Patient Home Provider: [facility] [place] [facility]   History of Present Illness: Adjustment disorder with mixed anxiety and depressed mood   Observations/Objective: Check In: Case Manager checked in with all participants to review discharge dates, insurance authorizations, work-related documents and needs from the treatment team regarding medications. [name] stated needs and engaged in discussion.    Initial Therapeutic Activity: Counselor facilitated a check-in with [name] to assess for safety, sobriety and medication compliance.  Counselor also inquired about [name]'s current emotional ratings, as well as any significant changes in thoughts, feelings or behavior since previous check in.  [name] presented for session on time and was alert, oriented x5, with no evidence or self-report of active SI/HI or A/V H.  [name] reported compliance with medication and denied use of alcohol or illicit substances.  [name] reported scores of 4/10 for depression, 0/10 for anxiety, and 0/10 for anger/irritability.  [name] denied any recent outbursts or panic attacks.  [name] reported that a recent success was choosing not to visit [place] as originally planned, stating Some people pissed me off.  She reported that instead, she set a boundary, and put in some job applications.  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     Second Therapeutic Activity: Counselor provided psychoeducation on subject of boundaries with group members today using a virtual handout.  This handout defined boundaries as the limits and rules that we set for ourselves within relationships, and featured a breakdown of the 3 common categories of boundaries (i.e. porous, rigid, and healthy), along with typical traits specific to each one for easy identification.  It was noted that most people have a mixture of different boundary types depending on setting, person, and culture.  Additional information was provided on the types of boundaries (i.e. physical, intellectual, emotional, sexual, material, and time) within relationships, and what could be considered healthy versus unhealthy. Counselor tasked members with identifying what types of boundaries they presently hold within her own support systems, the collective impact these boundaries have upon their mental health, and changes that could be made in order to more effectively communicate individual mental health needs.  Intervention was effective, as evidenced by [name] participating in discussion on topic, reporting that she believes she has had healthy boundaries her entire life due to traits such as valuing her own opinions, sharing information appropriately, knowing her personal needs, and how to communicate them.  [name] stated "I know my limits, and that is one reason I didn't go to [place] this weekend".  [name] reported that at times she can be overly guarded about personal information depending on the situation, and have trouble saying No at times, so she will work on strengthening assertive communication skills while in therapy to resolve these problems.     Assessment and Plan: Counselor recommends that [name] remain in IOP treatment to better manage mental health symptoms, ensure stability and pursue completion of treatment  plan goals. Counselor recommends adherence to  crisis/safety plan, taking medications as prescribed, and following up with medical professionals if any issues arise.   Follow Up Instructions: Counselor will send Webex link for next session. [name] was advised to call back or seek an in-person evaluation if the symptoms worsen or if the condition fails to improve as anticipated.   Collaboration of Care:   Medication Management AEB [name], NP                                           Case Manager AEB Dellia Nims, CNA   Patient/Guardian was advised Release of Information must be obtained prior to any record release in order to collaborate their care with an outside provider. Patient/Guardian was advised if they have not already done so to contact the registration department to sign all necessary forms in order for [place] to release information regarding their care.   Consent: Patient/Guardian gives verbal consent for treatment and assignment of benefits for services provided during this visit. Patient/Guardian expressed understanding and agreed to proceed.  I provided 180 minutes of non-face-to-face time during this encounter.   Shade Flood, LCSW, LCAS 06/28/21

## 2021-06-29 ENCOUNTER — Other Ambulatory Visit (HOSPITAL_COMMUNITY): Payer: BC Managed Care – PPO | Admitting: Licensed Clinical Social Worker

## 2021-06-29 DIAGNOSIS — F4323 Adjustment disorder with mixed anxiety and depressed mood: Secondary | ICD-10-CM | POA: Diagnosis not present

## 2021-06-29 NOTE — Progress Notes (Signed)
Virtual Visit via Video Note   I connected with [name] on 06/29/21 at  9:00 AM EDT by a video enabled telemedicine application and verified that I am speaking with the correct person using two identifiers.   At orientation to the IOP program, Case Manager discussed the limitations of evaluation and management by telemedicine and the availability of in person appointments. The patient expressed understanding and agreed to proceed with virtual visits throughout the duration of the program.   Location:  Patient: Patient Home Provider: [facility] [place] [facility]   History of Present Illness: Adjustment disorder with mixed anxiety and depressed mood   Observations/Objective: Check In: Case Manager checked in with all participants to review discharge dates, insurance authorizations, work-related documents and needs from the treatment team regarding medications. [name] stated needs and engaged in discussion.    Initial Therapeutic Activity: Counselor facilitated a check-in with [name] to assess for safety, sobriety and medication compliance.  Counselor also inquired about [name]'s current emotional ratings, as well as any significant changes in thoughts, feelings or behavior since previous check in.  [name] presented for session on time and was alert, oriented x5, with no evidence or self-report of active SI/HI or A/V H.  [name] reported compliance with medication and denied use of alcohol or illicit substances.  [name] reported scores of 0/10 for depression, 5/10 for anxiety, and 0/10 for anger/irritability.  [name] denied any recent outbursts.  [name] reported that a struggle was dealing with an MS episode this morning, which was triggering a panic attack.  [name] reported that she coped with this by focusing on her breathing to self-regulate.  She reported that her goal today is to take a nap this afternoon to reclaim some energy.        Second Therapeutic Activity: Counselor introduced [employer], [facility] to provide psychoeducation on topic of Grief and Loss with members today.  [name] began discussion by checking in with the group about their baseline mood today, general thoughts on what grief means to them and how it has affected them personally in the past.  [name] provided information on how the process of grief/loss can differ depending upon one's unique culture, and categories of loss one could experience (i.e. loss of a person, animal, relationship, job, identity, etc).  [name] encouraged members to be mindful of how pervasive loss can be, and how to recognize signs which could indicate that this is having an impact on one's overall mental health and wellbeing.  Intervention was effective, as evidenced by [name] participating in discussion with speaker on the subject, reporting that when her mother passed away, she anticipated that she would have many difficult feelings that arose as a result, but instead felt numb, and felt judged by others regarding how one is 'supposed to grieve'.  [name] was receptive to feedback offered from speaker normalizing the experience of grieving differently from loss to loss.    Assessment and Plan: Counselor recommends that [name] remain in IOP treatment to better manage mental health symptoms, ensure stability and pursue completion of treatment plan goals. Counselor recommends adherence to crisis/safety plan, taking medications as prescribed, and following up with medical professionals if any issues arise.   Follow Up Instructions: Counselor will send Webex link for next session. [name] was advised to call back or seek an in-person evaluation if the symptoms worsen or if the condition fails to improve as anticipated.   Collaboration of Care:   Medication Management AEB [name], NP  Case Manager AEB Dellia Nims, CNA   Patient/Guardian was advised Release of Information must be obtained prior to  any record release in order to collaborate their care with an outside provider. Patient/Guardian was advised if they have not already done so to contact the registration department to sign all necessary forms in order for [place] to release information regarding their care.   Consent: Patient/Guardian gives verbal consent for treatment and assignment of benefits for services provided during this visit. Patient/Guardian expressed understanding and agreed to proceed.  I provided 180 minutes of non-face-to-face time during this encounter.   Shade Flood, LCSW, LCAS 06/29/21

## 2021-06-30 ENCOUNTER — Telehealth (HOSPITAL_COMMUNITY): Payer: Self-pay | Admitting: Psychiatry

## 2021-06-30 ENCOUNTER — Ambulatory Visit (HOSPITAL_COMMUNITY): Payer: BC Managed Care – PPO | Admitting: Psychiatry

## 2021-07-01 ENCOUNTER — Other Ambulatory Visit: Payer: Self-pay

## 2021-07-01 ENCOUNTER — Other Ambulatory Visit (HOSPITAL_COMMUNITY): Payer: BC Managed Care – PPO | Admitting: Psychiatry

## 2021-07-01 DIAGNOSIS — F4323 Adjustment disorder with mixed anxiety and depressed mood: Secondary | ICD-10-CM

## 2021-07-01 NOTE — Progress Notes (Signed)
Virtual Visit via Video Note   I connected with [name] on 07/01/21 at  9:00 AM EDT by a video enabled telemedicine application and verified that I am speaking with the correct person using two identifiers.   At orientation to the IOP program, Case Manager discussed the limitations of evaluation and management by telemedicine and the availability of in person appointments. The patient expressed understanding and agreed to proceed with virtual visits throughout the duration of the program.   Location:  Patient: Patient Home Provider: [facility] [place] [facility]   History of Present Illness: Adjustment disorder with mixed anxiety and depressed mood   Observations/Objective: Check In: Case Manager checked in with all participants to review discharge dates, insurance authorizations, work-related documents and needs from the treatment team regarding medications. [name] stated needs and engaged in discussion.    Initial Therapeutic Activity: Counselor facilitated a check-in with [name] to assess for safety, sobriety and medication compliance.  Counselor also inquired about [name]'s current emotional ratings, as well as any significant changes in thoughts, feelings or behavior since previous check in.  [name] presented for session on time and was alert, oriented x5, with no evidence or self-report of active SI/HI or A/V H.  [name] reported compliance with medication and denied use of alcohol or illicit substances.  [name] reported scores of 5/10 for depression, 5/10 for anxiety, and 9/10 for anger/irritability.  [name] denied any recent outbursts or panic attacks.  [name] reported that a recent struggle has been dealing with a migraine since the day before which has been influenced by excess stress.  [name] stated I saw my paycheck today and had to make some calls and I'm over it.  She reported that her goal is to find another job to transfer to which offers better pay and less stress.  [name]  declined offer from counselor to be excused today to cope with her migraine, stating I want to hear more about self-care today because I think that could be helpful.      Second Therapeutic Activity: Counselor introduced topic of self-care today.  Counselor explained how this can be defined as the things one does to maintain good health and improve well-being.  Counselor provided members with a self-care assessment form to complete.  This handout featured various sub-categories of self-care, including physical, psychological/emotional, social, spiritual, and professional.  Members were asked to rank their engagement in the activities listed for each dimension on a scale of 1-3, with 1 indicating 'Poor', 2 indicating '[place]', and 3 indicating 'Well'.  Counselor invited members to share results of their assessment, and inquired about which areas of self-care they are doing well in, as well as areas that require attention, and how they plan to begin addressing this during treatment. Intervention was effective, as evidenced by [name] successfully completing initial section of assessment and actively engaging in discussion on subject, reporting that she is excelling in areas such as eating regularly, eating healthy foods, participating in fun activities, and exercising, but would benefit from focusing more on areas such as resting when sick, and getting enough sleep.  [name] reported that she would work to improve self-care deficits by scheduling more time off from work for vacations or mental health days, and using a sleep journal or electronic wellness app to track patterns and improve routine.  [name] reported at 11:25am that she needed to leave session early in order to attend a doctor's appointment to assist with ongoing migraine issues.  Counselor informed care team.  Assessment and Plan: Counselor recommends that [name] remain in IOP treatment to better manage mental health symptoms, ensure stability and  pursue completion of treatment plan goals. Counselor recommends adherence to crisis/safety plan, taking medications as prescribed, and following up with medical professionals if any issues arise.   Follow Up Instructions: Counselor will send Webex link for next session. [name] was advised to call back or seek an in-person evaluation if the symptoms worsen or if the condition fails to improve as anticipated.   Collaboration of Care:   Medication Management AEB [name], NP                                           Case Manager AEB Dellia Nims, CNA   Patient/Guardian was advised Release of Information must be obtained prior to any record release in order to collaborate their care with an outside provider. Patient/Guardian was advised if they have not already done so to contact the registration department to sign all necessary forms in order for [place] to release information regarding their care.   Consent: Patient/Guardian gives verbal consent for treatment and assignment of benefits for services provided during this visit. Patient/Guardian expressed understanding and agreed to proceed.  I provided 145 minutes of non-face-to-face time during this encounter.   Shade Flood, LCSW, LCAS 07/01/21

## 2021-07-02 ENCOUNTER — Other Ambulatory Visit (HOSPITAL_COMMUNITY): Payer: BC Managed Care – PPO | Admitting: Licensed Clinical Social Worker

## 2021-07-02 ENCOUNTER — Other Ambulatory Visit: Payer: Self-pay

## 2021-07-02 DIAGNOSIS — F4323 Adjustment disorder with mixed anxiety and depressed mood: Secondary | ICD-10-CM

## 2021-07-02 NOTE — Progress Notes (Signed)
Virtual Visit via Video Note   I connected with [name] on 07/02/21 at  9:00 AM EDT by a video enabled telemedicine application and verified that I am speaking with the correct person using two identifiers.   At orientation to the IOP program, Case Manager discussed the limitations of evaluation and management by telemedicine and the availability of in person appointments. The patient expressed understanding and agreed to proceed with virtual visits throughout the duration of the program.   Location:  Patient: Patient Home Provider: [facility] [place] [facility]   History of Present Illness: Adjustment disorder with mixed anxiety and depressed mood   Observations/Objective: Check In: Case Manager checked in with all participants to review discharge dates, insurance authorizations, work-related documents and needs from the treatment team regarding medications. [name] stated needs and engaged in discussion.    Initial Therapeutic Activity: Counselor facilitated a check-in with [name] to assess for safety, sobriety and medication compliance.  Counselor also inquired about [name]'s current emotional ratings, as well as any significant changes in thoughts, feelings or behavior since previous check in.  [name] presented for session on time and was alert, oriented x5, with no evidence or self-report of active SI/HI or A/V H.  Aylanie reported compliance with medication and denied use of alcohol or illicit substances.  [name] reported scores of 4/10 for depression, 0/10 for anxiety, and 2/10 for anger/irritability.  [name] denied any recent outbursts or panic attacks.  [name] reported that a recent success was attending a doctor's appointment yesterday to assist with her migraine, stating They gave me a new medicine.  [name] reported that a recent struggle is continuing to deal with a headache this morning.  [name] reported that her goal this weekend is to rest and recuperate.        Second Therapeutic  Activity: Counselor introduced topic of grounding skills today.  Counselor defined these as simple strategies one can use to help detach from difficult thoughts or feelings temporarily by focusing on something else.  Counselor noted that grounding will not solve the problem at hand, but can provide the practitioner with time to regain control over their thoughts and/or feelings and prevent the situation from getting worse (i.e. interrupting a panic attack).  Counselor divided these into three categories (mental, physical, and soothing) and then provided examples of each which group members could practice during session.  Some of these included describing one's environment in detail or playing a categories game with oneself for mental category, taking a hot bath/shower, stretching, or carrying a grounding object for physical category, and saying kind statements, or visualizing people one cares about for soothing category.  Counselor inquired about which techniques members have used with success in the past, or will commit to learning, practicing, and applying now to improve coping abilities.  Intervention was effective, as evidenced by [name] participating in discussion on the subject, trying out several of the techniques during session, and expressing interest in adding several to her available coping skills, such as practicing deep breathing, reading an engrossing book, or watching a fantasy TV series like Game of Thrones.    Third Therapeutic Activity: Psycho-educational portion of group was provided by Christie Beckers, Mudlogger of community education with [employer].  Alexandra provided information on history of her local agency, mission statement, and the variety of unique services offered which group members might find beneficial to engage in, including both virtual and in-person support groups, as well as peer support program for mentoring.  Alexandra offered time  to answer member's questions  regarding services and encouraged them to consider utilizing these services to assist in working towards their individual wellness goals.  Intervention effectiveness could not be measured, as Sintia did not participate in discussion with speaker on subject.    Assessment and Plan: Counselor recommends that [name] remain in IOP treatment to better manage mental health symptoms, ensure stability and pursue completion of treatment plan goals. Counselor recommends adherence to crisis/safety plan, taking medications as prescribed, and following up with medical professionals if any issues arise.   Follow Up Instructions: Counselor will send Webex link for next session. [name] was advised to call back or seek an in-person evaluation if the symptoms worsen or if the condition fails to improve as anticipated.   Collaboration of Care:   Medication Management AEB [name], NP                                           Case Manager AEB Dellia Nims, CNA   Patient/Guardian was advised Release of Information must be obtained prior to any record release in order to collaborate their care with an outside provider. Patient/Guardian was advised if they have not already done so to contact the registration department to sign all necessary forms in order for [place] to release information regarding their care.   Consent: Patient/Guardian gives verbal consent for treatment and assignment of benefits for services provided during this visit. Patient/Guardian expressed understanding and agreed to proceed.  I provided 160 minutes of non-face-to-face time during this encounter.   Shade Flood, LCSW, LCAS 07/02/21

## 2021-07-05 ENCOUNTER — Other Ambulatory Visit (HOSPITAL_COMMUNITY): Payer: BC Managed Care – PPO | Admitting: Licensed Clinical Social Worker

## 2021-07-05 ENCOUNTER — Other Ambulatory Visit: Payer: Self-pay

## 2021-07-05 DIAGNOSIS — F4323 Adjustment disorder with mixed anxiety and depressed mood: Secondary | ICD-10-CM

## 2021-07-05 NOTE — Progress Notes (Signed)
Virtual Visit via Video Note   I connected with [name] on 07/05/21 at  9:00 AM EDT by a video enabled telemedicine application and verified that I am speaking with the correct person using two identifiers.   At orientation to the IOP program, Case Manager discussed the limitations of evaluation and management by telemedicine and the availability of in person appointments. The patient expressed understanding and agreed to proceed with virtual visits throughout the duration of the program.   Location:  Patient: Patient Home Provider: [facility] [place] [facility] [place] [facility]   History of Present Illness: Adjustment disorder with mixed anxiety and depressed mood   Observations/Objective: Check In: Case Manager checked in with all participants to review discharge dates, insurance authorizations, work-related documents and needs from the treatment team regarding medications. Michaiah stated needs and engaged in discussion.    Initial Therapeutic Activity: Counselor facilitated a check-in with Corri to assess for safety, sobriety and medication compliance.  Counselor also inquired about Sharilynn's current emotional ratings, as well as any significant changes in thoughts, feelings or behavior since previous check in.  Lylie presented for session on time and was alert, oriented x5, with no evidence or self-report of active SI/HI or A/V H.  Kemyra reported compliance with medication and denied use of alcohol or illicit substances.  Jacquelinne reported scores of 5/10 for depression, 0/10 for anxiety, and 6/10 for anger/irritability.  Darina denied any recent outbursts or panic attacks.  Nicey reported that a recent success was taking time to rest over the weekend following recent issues with MS and migraines, stating I had to lay myself down for awhile.  She reported that she also did a facial routine for self-care.  Raiya reported that a current struggle is dealing with tests at work which have been stressful.  Evely  reported that her goal is to schedule for a pedicure tomorrow for more self-care.      Second Therapeutic Activity: Counselor engaged the group in discussion on managing work/life balance today to improve mental health and wellness.  Counselor explained how finding balance between responsibilities at home and work place can be challenging, lead to increased stress, and this has been further complicated by recent pandemic leading to unemployment, more virtual work, and blurring of lines between home as a place of rest or work duties.  Counselor facilitated discussion on what challenges members have faced with this issue historically, as well as what, if any, issues have arisen following pandemic. Counselor also discussed strategies for improving work/life balance while members work on their mental health during treatment.  Some of these included keeping track of time management; creating a list of priorities and scaling importance; setting realistic, measurable goals each day; establishing boundaries; taking care of health needs; and nurturing relationships at home and work for support.  Counselor inquired about areas where members feel they are excelling, as well as areas they could focus on during treatment.  Interventions were effective, as evidenced by Daryl Eastern showing some engagement in discussion on subject, reporting that her job has been stressful for some time now, and led to several symptoms of burnout, including frequent headaches and migraines, irritability, isolation, and lowered immunity as evidenced from flareups of MS.  Rozina reported that when she gets home from work, she oftentimes goes straight to her room in order to avoid getting into arguments with family and taking frustration out on them.  Aubry reported that she is actively looking for a new job that is less stressful, and will  make an effort to correct work life imbalance so that she can include more time in schedule for self-care  activities.    Assessment and Plan: Counselor recommends that Enma remain in IOP treatment to better manage mental health symptoms, ensure stability and pursue completion of treatment plan goals. Counselor recommends adherence to crisis/safety plan, taking medications as prescribed, and following up with medical professionals if any issues arise.   Follow Up Instructions: Counselor will send Webex link for next session. Cyndra was advised to call back or seek an in-person evaluation if the symptoms worsen or if the condition fails to improve as anticipated.   Collaboration of Care:   Medication Management AEB [name], NP                                           Case Manager AEB Dellia Nims, CNA   Patient/Guardian was advised Release of Information must be obtained prior to any record release in order to collaborate their care with an outside provider. Patient/Guardian was advised if they have not already done so to contact the registration department to sign all necessary forms in order for [place] to release information regarding their care.   Consent: Patient/Guardian gives verbal consent for treatment and assignment of benefits for services provided during this visit. Patient/Guardian expressed understanding and agreed to proceed.  I provided 180 minutes of non-face-to-face time during this encounter.   Shade Flood, LCSW, LCAS 07/05/21

## 2021-07-06 ENCOUNTER — Other Ambulatory Visit: Payer: Self-pay

## 2021-07-06 ENCOUNTER — Other Ambulatory Visit (HOSPITAL_COMMUNITY): Payer: BC Managed Care – PPO | Admitting: Licensed Clinical Social Worker

## 2021-07-06 DIAGNOSIS — F4323 Adjustment disorder with mixed anxiety and depressed mood: Secondary | ICD-10-CM | POA: Diagnosis not present

## 2021-07-06 NOTE — Progress Notes (Signed)
Virtual Visit via Video Note   I connected with [name] on 07/06/21 at  9:00 AM EDT by a video enabled telemedicine application and verified that I am speaking with the correct person using two identifiers.   At orientation to the IOP program, Case Manager discussed the limitations of evaluation and management by telemedicine and the availability of in person appointments. The patient expressed understanding and agreed to proceed with virtual visits throughout the duration of the program.   Location:  Patient: Patient Home Provider: [facility] [place] [facility]   History of Present Illness: Adjustment disorder with mixed anxiety and depressed mood   Observations/Objective: Check In: Case Manager checked in with all participants to review discharge dates, insurance authorizations, work-related documents and needs from the treatment team regarding medications. [name] stated needs and engaged in discussion.    Initial Therapeutic Activity: Counselor facilitated a check-in with [name] to assess for safety, sobriety and medication compliance.  Counselor also inquired about [name]'s current emotional ratings, as well as any significant changes in thoughts, feelings or behavior since previous check in.  [name] presented for session on time and was alert, oriented x5, with no evidence or self-report of active SI/HI or A/V H.  [name] reported compliance with medication and denied use of alcohol or illicit substances.  [name] reported scores of 3/10 for depression, 0/10 for anxiety, and 4/10 for anger/irritability.  [name] denied any recent outbursts or panic attacks.  [name] reported that a recent success was completing a certification at one of her jobs, and attending a doctor's appointment this morning.  [name] reported that her goal is to make plans for another medical appointment later this week when she will have a biopsy done and need someone to transport her home.        Second Therapeutic  Activity: Counselor invited members to participate in peaceful place guided imagery activity today.  Counselor explained how this is a powerful visualization tool which can aid in reducing stress while increasing sense of calm, control, and awareness if practiced regularly.  Counselor informed members beforehand that if they became uncomfortable at any point during activity, they could stop and open their eyes.  Counselor invited members to get comfortable, achieve a relaxing breathing rhythm, close their eyes, and then guided them through process of creating a 'peaceful place' which filled them with safety and calm.  Counselor encouraged members to include sensory details involving vision, sound, touch, smell, and taste which they considered pleasant to enhance experience.  After 10 minutes of practice in session, counselor invited members to share their opinion on the activity, including whether they were able to imagine a specific place, what details stood out to them, and how this made them feel during and after.  Intervention was effective, as evidenced by [name] successfully participating in activity, and stating I meditate a lot.  I didn't imagine a peaceful place, so I just focused on my breathing and that was fine.     Third Therapeutic Activity: Counselor introduced [employer], [place] Chaplain to provide psychoeducation on topic of Grief and Loss with members today.  [name] began discussion by checking in with the group about their baseline mood today, general thoughts on what grief means to them and how it has affected them personally in the past.  [name] provided information on how the process of grief/loss can differ depending upon one's unique culture, and categories of loss one could experience (i.e. loss of a person, animal, relationship, job, identity, etc).  [name] encouraged members to be mindful of how pervasive loss can be, and how to recognize signs which could indicate that this is  having an impact on one's overall mental health and wellbeing.  Intervention was effective, as evidenced by [name] participating in discussion with speaker on the subject, and disclosing that she was initially hesitant to approach this subject today because she normally perceives the topic of grief and loss to be a negative.  [name] reported that she has only dealt with chaplains in the hospital, and opened up about how her religion has different cultural rituals that are performed when someone passes, and this has led her to interpret death differently in her older age, stating It makes me value people more.     Assessment and Plan: Counselor recommends that [name] remain in IOP treatment to better manage mental health symptoms, ensure stability and pursue completion of treatment plan goals. Counselor recommends adherence to crisis/safety plan, taking medications as prescribed, and following up with medical professionals if any issues arise.   Follow Up Instructions: Counselor will send Webex link for next session. [name] was advised to call back or seek an in-person evaluation if the symptoms worsen or if the condition fails to improve as anticipated.   Collaboration of Care:   Medication Management AEB [name], NP                                           Case Manager AEB Dellia Nims, CNA   Patient/Guardian was advised Release of Information must be obtained prior to any record release in order to collaborate their care with an outside provider. Patient/Guardian was advised if they have not already done so to contact the registration department to sign all necessary forms in order for [place] to release information regarding their care.   Consent: Patient/Guardian gives verbal consent for treatment and assignment of benefits for services provided during this visit. Patient/Guardian expressed understanding and agreed to proceed.  I provided 180 minutes of non-face-to-face time during this  encounter.   Shade Flood, LCSW, LCAS 07/06/21

## 2021-07-07 ENCOUNTER — Other Ambulatory Visit (HOSPITAL_COMMUNITY): Payer: BC Managed Care – PPO | Attending: Family | Admitting: Licensed Clinical Social Worker

## 2021-07-07 ENCOUNTER — Other Ambulatory Visit: Payer: Self-pay

## 2021-07-07 DIAGNOSIS — F32A Depression, unspecified: Secondary | ICD-10-CM | POA: Diagnosis not present

## 2021-07-07 DIAGNOSIS — Z560 Unemployment, unspecified: Secondary | ICD-10-CM | POA: Insufficient documentation

## 2021-07-07 DIAGNOSIS — F419 Anxiety disorder, unspecified: Secondary | ICD-10-CM | POA: Diagnosis not present

## 2021-07-07 DIAGNOSIS — F4323 Adjustment disorder with mixed anxiety and depressed mood: Secondary | ICD-10-CM

## 2021-07-07 NOTE — Progress Notes (Signed)
Virtual Visit via Video Note ?  ?I connected with [name] on 07/07/21 at  9:00 AM EDT by a video enabled telemedicine application and verified that I am speaking with the correct person using two identifiers. ?  ?At orientation to the IOP program, Case Manager discussed the limitations of evaluation and management by telemedicine and the availability of in person appointments. The patient expressed understanding and agreed to proceed with virtual visits throughout the duration of the program. ?  ?Location:  ?Patient: Patient Home ?Provider: [facility] [place] [facility] ?  ?History of Present Illness: ?Adjustment disorder with mixed anxiety and depressed mood ?  ?Observations/Objective: ?Check In: Case Manager checked in with all participants to review discharge dates, insurance authorizations, work-related documents and needs from the treatment team regarding medications. [name] stated needs and engaged in discussion.  ?  ?Initial Therapeutic Activity: Counselor facilitated a check-in with [name] to assess for safety, sobriety and medication compliance.  Counselor also inquired about [name]'s current emotional ratings, as well as any significant changes in thoughts, feelings or behavior since previous check in.  [name] presented for session on time and was alert, oriented x5, with no evidence or self-report of active SI/HI or A/V H.  [name] reported that she has not been taking her medication for MS, and stated ?My arm has been feeling numb?Marland Kitchen  She denied use of alcohol or illicit substances.  [name] reported scores of 0/10 for depression, 7/10 for anxiety, and 3/10 for anger/irritability.  [name] denied any recent outbursts or panic attacks.  [name] reported that a recent struggle has been dealing with symptoms similar to a stomach virus since yesterday when she picked up her grandson and noticed he was not feeling well.  [name] reported that she went to bed early yesterday but didn't notice a difference in  physical condition this morning.  Counselor offered for [name] to be excused, and recommended that she outreach her doctors about her health and noncompliance with medication.  [name] declined offer to be excused, but agreed to follow up with medical professionals about current issues.      ?   ?Second Therapeutic Activity: Counselor introduced topic of building social support network today.  Counselor explained how this can be defined as having a having a group of healthy people in one's life you can talk to, spend time with, and get help from to improve both mental and physical health.  Counselor noted that some barriers can make it difficult to connect with other people, including the presence of anxiety or depression, or moving to an unfamiliar area.  Group members were asked to assess the current state of their support network, and identify ways that this could be improved.  Tips were given on how to address previously noted barriers, such as strengthening social skills, using relaxation techniques to reduce anxiety, scheduling social time each week, and/or exploring social events nearby which could increase chances of meeting new supports.  Members were also encouraged to consider getting closer to people they already know through suggestions such as outreaching someone by text, email or phone call if they haven't spoken in awhile, doing something nice for a friend/family member unexpectedly, and/or inviting someone over for a game/movie/dinner night.  Intervention was effective, as evidenced by [name] participating in discussion on the subject, and reporting that she feels confident that she has a good support network at this time based upon criteria covered.  [name] stated ?I have a lot of friends and can make them pretty  easily?[name] reported that one goal she can work on is opening up to her supports more readily for assistance when faced with overwhelming challenges, stating ?I never really tell  people whats going on with me I guess?.   ? ?Assessment and Plan: ?Counselor recommends that [name] remain in IOP treatment to better manage mental health symptoms, ensure stability and pursue completion of treatment plan goals. Counselor recommends adherence to crisis/safety plan, taking medications as prescribed, and following up with medical professionals if any issues arise. ?  ?Follow Up Instructions: ?Counselor will send Webex link for next session. [name] was advised to call back or seek an in-person evaluation if the symptoms worsen or if the condition fails to improve as anticipated. ?  ?Collaboration of Care:   Medication Management AEB [name], NP  ?                                         Case Manager AEB Dellia Nims, CNA  ? ?Patient/Guardian was advised Release of Information must be obtained prior to any record release in order to collaborate their care with an outside provider. Patient/Guardian was advised if they have not already done so to contact the registration department to sign all necessary forms in order for [place] to release information regarding their care.  ? ?Consent: Patient/Guardian gives verbal consent for treatment and assignment of benefits for services provided during this visit. Patient/Guardian expressed understanding and agreed to proceed. ? ?I provided 180 minutes of non-face-to-face time during this encounter. ?  ?Shade Flood, LCSW, LCAS ?07/07/21 ?

## 2021-07-08 ENCOUNTER — Other Ambulatory Visit (HOSPITAL_COMMUNITY): Payer: BC Managed Care – PPO | Admitting: Licensed Clinical Social Worker

## 2021-07-08 DIAGNOSIS — F32A Depression, unspecified: Secondary | ICD-10-CM | POA: Diagnosis not present

## 2021-07-08 DIAGNOSIS — F4323 Adjustment disorder with mixed anxiety and depressed mood: Secondary | ICD-10-CM

## 2021-07-08 NOTE — Progress Notes (Signed)
Virtual Visit via Video Note ?  ?I connected with [name] on 07/08/21 at  9:00 AM EDT by a video enabled telemedicine application and verified that I am speaking with the correct person using two identifiers. ?  ?At orientation to the IOP program, Case Manager discussed the limitations of evaluation and management by telemedicine and the availability of in person appointments. The patient expressed understanding and agreed to proceed with virtual visits throughout the duration of the program. ?  ?Location:  ?Patient: Patient Home ?Provider: [facility] [place] [facility] ?  ?History of Present Illness: ?Adjustment disorder with mixed anxiety and depressed mood ?  ?Observations/Objective: ?Check In: Case Manager checked in with all participants to review discharge dates, insurance authorizations, work-related documents and needs from the treatment team regarding medications. Lindsay stated needs and engaged in discussion.  ?  ?Initial Therapeutic Activity: Counselor facilitated a check-in with [name] to assess for safety, sobriety and medication compliance.  Counselor also inquired about [name]'s current emotional ratings, as well as any significant changes in thoughts, feelings or behavior since previous check in.  [name] presented for session on time and was alert, oriented x5, with no evidence or self-report of active SI/HI or A/V H.  [name] reported compliance with medication and denied use of alcohol or illicit substances.  [name] reported scores of 4/10 for depression, 2/10 for anxiety, and 5/10 for anger/irritability.  [name] denied any recent outbursts or panic attacks.  [name] reported that a recent success was landing a new job she will start later this month, and taking things easier yesterday since she hasn't been feeling well.  [name] reported that a current struggle is managing boundaries with her ex, who can negatively affect her mood.  [name] reported that she is still dealing with a flareup as well,  so when she visits her ex's home today to pick up her daughter, she will minimize contact with him to avoid triggering her.    ?   ?Second Therapeutic Activity: Counselor continued discussion upon topic of self-care today with group members.  Counselor virtually shared self-care assessment form with members via Webex to complete final sub-categories (i.e. social, spiritual, and professional).  Members were asked to rank their engagement in the activities listed for each dimension on a scale of 1-3, with 1 indicating 'Poor', 2 indicating 'Ok', and 3 indicating 'Well'.  Counselor invited members to share results of this assessment, and inquired about which areas of self-care they are doing well in, as well as areas that require attention, and how they plan to begin addressing this during treatment.  Intervention was effective, as evidenced by [name] successfully completing final sections of assessment and actively engaging in discussion on subject, reporting that she is excelling in areas such as praying, meditation, recognizing things that give meaning to life, and participating in a cause that's important to her, but would benefit from focusing more on areas such as asking others for help when needed, or spending time in nature.  [name] reported that she would work to improve self-care deficits by changing her perspective on asking for help so that she doesn't feel like this is a weakness, noting that when stress gets elevated enough, it can begin to negatively affect her physical health condition.   ? ?Assessment and Plan: ?Counselor recommends that [name] remain in IOP treatment to better manage mental health symptoms, ensure stability and pursue completion of treatment plan goals. Counselor recommends adherence to crisis/safety plan, taking medications as prescribed, and following up with  medical professionals if any issues arise. ?  ?Follow Up Instructions: ?Counselor will send Webex link for next session.  [name] was advised to call back or seek an in-person evaluation if the symptoms worsen or if the condition fails to improve as anticipated. ?  ?Collaboration of Care:   Medication Management AEB [name], NP  ?                                         Case Manager AEB Dellia Nims, CNA  ? ?Patient/Guardian was advised Release of Information must be obtained prior to any record release in order to collaborate their care with an outside provider. Patient/Guardian was advised if they have not already done so to contact the registration department to sign all necessary forms in order for [place] to release information regarding their care.  ? ?Consent: Patient/Guardian gives verbal consent for treatment and assignment of benefits for services provided during this visit. Patient/Guardian expressed understanding and agreed to proceed. ? ?I provided 180 minutes of non-face-to-face time during this encounter. ?  ?Shade Flood, LCSW, LCAS ?07/08/21 ?

## 2021-07-09 ENCOUNTER — Ambulatory Visit (HOSPITAL_COMMUNITY): Payer: BC Managed Care – PPO

## 2021-07-12 ENCOUNTER — Other Ambulatory Visit (HOSPITAL_COMMUNITY): Payer: BC Managed Care – PPO | Admitting: Licensed Clinical Social Worker

## 2021-07-12 ENCOUNTER — Other Ambulatory Visit: Payer: Self-pay

## 2021-07-12 DIAGNOSIS — F32A Depression, unspecified: Secondary | ICD-10-CM | POA: Diagnosis not present

## 2021-07-12 DIAGNOSIS — F4323 Adjustment disorder with mixed anxiety and depressed mood: Secondary | ICD-10-CM

## 2021-07-12 NOTE — Progress Notes (Signed)
Virtual Visit via Video Note ?  ?I connected with [name] on 07/12/21 at  9:00 AM EDT by a video enabled telemedicine application and verified that I am speaking with the correct person using two identifiers. ?  ?At orientation to the IOP program, Case Manager discussed the limitations of evaluation and management by telemedicine and the availability of in person appointments. The patient expressed understanding and agreed to proceed with virtual visits throughout the duration of the program. ?  ?Location:  ?Patient: Patient Home ?Provider: [facility] [place] [facility] ?  ?History of Present Illness: ?Adjustment disorder with mixed anxiety and depressed mood ?  ?Observations/Objective: ?Check In: Case Manager checked in with all participants to review discharge dates, insurance authorizations, work-related documents and needs from the treatment team regarding medications. Mayline stated needs and engaged in discussion.  ?  ?Initial Therapeutic Activity: Counselor facilitated a check-in with Ambri to assess for safety, sobriety and medication compliance.  Counselor also inquired about Delanie's current emotional ratings, as well as any significant changes in thoughts, feelings or behavior since previous check in.  Germani presented for session on time and was alert, oriented x5, with no evidence or self-report of active SI/HI or A/V H.  Haily reported compliance with medication and denied use of alcohol or illicit substances.  Emry reported scores of 3/10 for depression, 0/10 for anxiety, and 0/10 for anger/irritability.  Vasti denied any recent outbursts or panic attacks.  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  Francoise reported that a recent struggle was experiencing frustration on Friday dealing with her son, who has recently started driving and borrowed her old car for the timebeing.  Zury reported that she  has tried to assist, but there have been some disagreements over insurance, stating ?He feels like I'm doing too much and went on a rant?Daryl Eastern reported that she had a medical appointment to attend at 10am today, and logged off from this time until 11:30am when she returned.      ?   ?Second Therapeutic Activity: Counselor introduced topic of self-esteem today and defined this as the value an individual places on oneself, based upon assessment of personal worth as a human being and approval/disapproval of one's behavior. Counselor asked members to assess their level of self-esteem at this time based upon common indicators of high self-esteem, including: accepting oneself unconditionally;  having self-respect and deep seated belief that one matters; being unaffected by other people's opinions/criticisms; and showing good control over emotions.  Counselor also explained concept of one's inner critic which serves to highlight faults and minimize strengths, directly influencing low sense of self-esteem.  Counselor then provided handout on 'strengths and qualities', which featured questions to guide discussion and increase awareness of each member's unique individual abilities which could reinforce higher self-esteem. Examples of questions included: 'things I am good at', 'challenges I have overcome', and 'what I like about myself'.  Intervention was effective, as evidenced by Daryl Eastern actively engaging in discussion on topic, competing self-esteem assessment, and reporting that she had a high score (23-27) which indicated a 'great' level of self-esteem.  Hibo reported that she possesses numerous positive traits of self-esteem that reinforce this score, such as liking herself, feeling valued, not comparing herself to others, and accepting challenges readily.  Swayzie reported that her mother did not have high self-esteem which she was growing up, and she believes  her mother attempted to compensate for this by  boosting her children's self-esteem as a result, stating ?Because of that, its just never really been an issue.  If anything, I need to avoid coming off as cocky?.   ? ?Assessment and Plan: ?Counselor recommends that Ermelinda remain in IOP treatment to better manage mental health symptoms, ensure stability and pursue completion of treatment plan goals. Counselor recommends adherence to crisis/safety plan, taking medications as prescribed, and following up with medical professionals if any issues arise. ?  ?Follow Up Instructions: ?Counselor will send Webex link for next session. Jurni was advised to call back or seek an in-person evaluation if the symptoms worsen or if the condition fails to improve as anticipated. ?  ?Collaboration of Care:   Medication Management AEB [name], NP  ?                                         Case Manager AEB Dellia Nims, CNA  ? ?Patient/Guardian was advised Release of Information must be obtained prior to any record release in order to collaborate their care with an outside provider. Patient/Guardian was advised if they have not already done so to contact the registration department to sign all necessary forms in order for [place] to release information regarding their care.  ? ?Consent: Patient/Guardian gives verbal consent for treatment and assignment of benefits for services provided during this visit. Patient/Guardian expressed understanding and agreed to proceed. ? ?I provided 90 minutes of non-face-to-face time during this encounter. ?  ?Shade Flood, LCSW, LCAS ?07/12/21 ?

## 2021-07-13 ENCOUNTER — Other Ambulatory Visit (HOSPITAL_COMMUNITY): Payer: BC Managed Care – PPO | Admitting: Family

## 2021-07-13 ENCOUNTER — Other Ambulatory Visit: Payer: Self-pay

## 2021-07-13 ENCOUNTER — Encounter (HOSPITAL_COMMUNITY): Payer: Self-pay | Admitting: Family

## 2021-07-13 DIAGNOSIS — F32A Depression, unspecified: Secondary | ICD-10-CM | POA: Diagnosis not present

## 2021-07-13 DIAGNOSIS — F4323 Adjustment disorder with mixed anxiety and depressed mood: Secondary | ICD-10-CM

## 2021-07-13 NOTE — Patient Instructions (Signed)
D:  Patient completed MH-IOP today.  A:  Discharge today.  Follow up with PCP.  Pt requesting PCP refer her to a therapist/psychiatrist.  Recommend support groups through [place] [facility].  Return to work on 07-16-21 without any restrictions.  R:  Patient receptive. ?

## 2021-07-13 NOTE — Progress Notes (Signed)
Virtual Visit via Video Note ? ?I connected with [name] on '@TODAY'$ @ at  9:00 AM EST by a video enabled telemedicine application and verified that I am speaking with the correct person using two identifiers. ? ?Location: ?Patient: at home ?Provider: at office ?  ?I discussed the limitations of evaluation and management by telemedicine and the availability of in person appointments. The patient expressed understanding and agreed to proceed. ? ?I discussed the assessment and treatment plan with the patient. The patient was provided an opportunity to ask questions and all were answered. The patient agreed with the plan and demonstrated an understanding of the instructions. ?  ?The patient was advised to call back or seek an in-person evaluation if the symptoms worsen or if the condition fails to improve as anticipated. ? ?I provided 20 minutes of non-face-to-face time during this encounter. ? ? ?[place], [place], [place] ? ? ?Patient ID: [name], female   DOB: 1976-04-30, 46 y.o.   MRN: [id number] ?As per previous CCA from 08-11-20 states:  "This is a 46 yr old engaged, employed, [place] American female who was referred per a previous pt; d/t worsening depressive and anxiety symptoms.  Denies SI/HI or A/V hallucinations.  Reports struggling with symptoms for 3-4 months before they started worsening recently.  Triggers/Stressors:  1) In Sept 2021 pt's 4 yr old daughter was dx'd with MS.  During that time, according to pt, her whold household had COVID-19.  2) Medical Issue:  In Nov. 2021, pt was dx'd with MS.  3) Job ([employer]) of three yrs.  According to pt, she hasn't been able to meet her quota d/t unrealistic expectations.  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  4) No support system since her mother passed in 2017.  Pt denies any prior psychiatric hospitalizations or suicide attempts/gestures.  States she hasn't ever seen any behavioral health providers before.  Family hx:  Father and P-Uncles (Drugs/ETOH) ?   ?Current Symptoms/Problems: Panic attacks (2-3 X per week:  at night), sadness, increased anxiety, decreased appetite (lost 30 lbs within one month), flucuating sleep (difficult to get back to sleep), poor energy, decreased concentration, irritable, c/o hair falling out, anhedonia, isolative" ?  ?  ?Patient requested to return to MH-IOP d/t worsening depressive/anxiety symptoms.  C/O same previous stressors in addition to:  1) daughter who continues to struggle with MS is now struggling with SI now.  "I have to leave my job periodically to pick her up from school."  2) Housing:  Pt states she has five kids and 4 grandkids.  "This morning my youngest daughter just stated that she and her kids have been kicked out of their home, so they are moving in with me." ?Pt reports that she will have nine other people living in her home.  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  "I have problems with the way they want [place] to sale to people." ?Pt denies SI/HI or A/V hallucinations. ? ?Pt attended thirteen days out of fifteen that were authorized.  States she is "ok.  I learned some tools."  On a scale of 1-10 (10 being the worst); pt rates her anxiety at a 3 and depression at a 4.  Continues to deny SI/HI or A/V hallucinations. ?Pt is requesting that her PCP refer her to a therapist.   ?A:  D/C today.  Pt gave verbal consent for treatment, release chart information to  referred providers and to complete any forms if needed.  Pt also gave consent for attending group virtually d/t COVID-19 social distancing restrictions.  Encouraged support groups thru [facility] [employer].  RTW on 07-16-21; without any restrictions.  R:  Pt receptive. ? ?Dellia Nims, M.Ed,CNA ? ? ? ? ? ? ? ? ? ? ? ? ? ? ? ? ? ? ? ? ? ? ? ? ?

## 2021-07-13 NOTE — Progress Notes (Signed)
Virtual Visit via Video Note ?  ?I connected with [name] on 07/13/21 at  9:00 AM EDT by a video enabled telemedicine application and verified that I am speaking with the correct person using two identifiers. ?  ?At orientation to the IOP program, Case Manager discussed the limitations of evaluation and management by telemedicine and the availability of in person appointments. The patient expressed understanding and agreed to proceed with virtual visits throughout the duration of the program. ?  ?Location:  ?Patient: Patient Home ?Provider: [facility] ?  ?History of Present Illness: ?Adjustment disorder with mixed anxiety and depressed mood ?  ?Observations/Objective: ?Check In: Case Manager checked in with all participants to review discharge dates, insurance authorizations, work-related documents and needs from the treatment team regarding medications. Diksha stated needs and engaged in discussion.  ?  ?Initial Therapeutic Activity: Counselor facilitated a check-in with Satcha to assess for safety, sobriety and medication compliance.  Counselor also inquired about Freada's current emotional ratings, as well as any significant changes in thoughts, feelings or behavior since previous check in.  Sharran presented for session on time and was alert, oriented x5, with no evidence or self-report of active SI/HI or A/V H.  Pippa reported compliance with medication and denied use of alcohol or illicit substances.  [place] reported scores of 3/10 for depression, 3/10 for anxiety, and 0/10 for anger/irritability.  Tima denied any recent outbursts or panic attacks.  Darleth reported that a success is completing MHIOP today and planning for return to a regular work schedule.  Tyannah reported that a recent struggle was having a long day yesterday and experiencing fatigue this morning as a result.  Jacklin reported that her goal today is to start the next chapter of training for her new job, and stated  ?I am going to make sure I schedule more self-care time and slow down a little bit more?.    ?   ?Second Therapeutic Activity: Counselor introduced [employer], [facility] Cha[facility] to provide psychoeducation on topic of Grief and Loss with members today.  Curt Bears began discussion by checking in with the group about their baseline mood today, general thoughts on what grief means to them and how it has affected them personally in the past.  Curt Bears provided information on how the process of grief/loss can differ depending upon one's unique culture, and categories of loss one could experience (i.e. loss of a person, animal, relationship, job, identity, etc).  Curt Bears encouraged members to be mindful of how pervasive loss can be, and how to recognize signs which could indicate that this is having an impact on one's overall mental health and wellbeing.  Intervention was effective, as evidenced by [name] reporting that she tries to have regular family dinners in order to stay connected with loved ones, noting that this could be helpful in processing mutual losses they will inevitably face and carrying on traditions that have been passed on to her.  Lachrista also reported that a benefit for her in processing grief in a healthy manner is building resilience to help deal with future challenges.          ? ?Assessment and Plan: ?Bali reported that she is ready for discharge from [facility] today.  Counselor recommends adherence to crisis/safety plan, taking medications as prescribed, and following up with medical professionals if any issues arise. ?  ?Follow Up Instructions: ?Sierrah was advised to call back or seek an in-person evaluation if the symptoms worsen or if the condition fails  to improve as anticipated. ?  ?Collaboration of Care:   Medication Management AEB [name], NP  ?                                         Case Manager AEB Dellia Nims, CNA  ? ?Patient/Guardian was advised Release of Information must be  obtained prior to any record release in order to collaborate their care with an outside provider. Patient/Guardian was advised if they have not already done so to contact the registration department to sign all necessary forms in order for [place] to release information regarding their care.  ? ?Consent: Patient/Guardian gives verbal consent for treatment and assignment of benefits for services provided during this visit. Patient/Guardian expressed understanding and agreed to proceed. ? ?I provided 170 minutes of non-face-to-face time during this encounter. ?  ?Shade Flood, LCSW, LCAS ?07/13/21 ? ?

## 2021-07-13 NOTE — Progress Notes (Addendum)
Virtual Visit via Video Note ? ?I connected with [name] on 07/13/21 at  9:00 AM EST by a video enabled telemedicine application and verified that I am speaking with the correct person using two identifiers. ? ?Location: ?Patient: Home ?Provider: Office ?  ?I discussed the limitations of evaluation and management by telemedicine and the availability of in person appointments. The patient expressed understanding and agreed to proceed ? ? ?I discussed the assessment and treatment plan with the patient. The patient was provided an opportunity to ask questions and all were answered. The patient agreed with the plan and demonstrated an understanding of the instructions. ?  ?The patient was advised to call back or seek an in-person evaluation if the symptoms worsen or if the condition fails to improve as anticipated. ? ?I provided 15 minutes of non-face-to-face time during this encounter. ? ? ?Derrill Center, NP ? ? ?[place] ?Intensive Outpatient Program ?Discharge Summary ? ?[name] ?[id number] ? ?Admission date:06/23/2021 ?Discharge date: 07/13/2021 ? ?Reason for admission: per admission assessment note: [name] is a 46 year old female who presents for ongoing symptoms of worsening depression and anxiety.  She reports she continues to struggle with multiple stressors related to family, grief and loss and employment. ?  ?States she is still employed by [employer] however due to demanding work load and higher expectations she became stressed and overwhelmed.  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 ? ? ?Progress in Program Toward Treatment Goals: Progressing-patient attended and participated with daily group session with active and engaged participation.  Denying suicidal or homicidal ideation at discharge.  No medications was initiated during this admission.  Patient to keep follow-up appointment with  all outpatient providers ? ?Progress (rationale): Case management provided additional outpatient resources for the [facility] and therapy services.  ? ? ?Take all medications as prescribed. ?Keep all follow-up appointments as scheduled.  ?Do not consume alcohol or use illegal drugs while on prescription medications. ?Report any adverse effects from your medications to your primary care provider promptly.  ?In the event of recurrent symptoms or worsening symptoms, call 911, a crisis hotline, or go to the nearest emergency department for evaluation.   ? ?Derrill Center, NP ?07/13/2021 ?

## 2021-07-14 ENCOUNTER — Ambulatory Visit (HOSPITAL_COMMUNITY): Payer: BC Managed Care – PPO

## 2021-07-15 ENCOUNTER — Ambulatory Visit (HOSPITAL_COMMUNITY): Payer: BC Managed Care – PPO

## 2021-07-16 ENCOUNTER — Ambulatory Visit (HOSPITAL_COMMUNITY): Payer: BC Managed Care – PPO

## 2021-07-20 ENCOUNTER — Emergency Department (HOSPITAL_COMMUNITY)
Admission: EM | Admit: 2021-07-20 | Discharge: 2021-07-20 | Disposition: A | Discharge status: Home / Self Care | Payer: BC Managed Care – PPO | Attending: Emergency Medicine | Admitting: Emergency Medicine

## 2021-07-20 ENCOUNTER — Other Ambulatory Visit: Payer: Self-pay

## 2021-07-20 ENCOUNTER — Encounter (HOSPITAL_COMMUNITY): Payer: Self-pay | Admitting: *Deleted

## 2021-07-20 DIAGNOSIS — J020 Streptococcal pharyngitis: Secondary | ICD-10-CM | POA: Insufficient documentation

## 2021-07-20 DIAGNOSIS — Z7982 Long term (current) use of aspirin: Secondary | ICD-10-CM | POA: Diagnosis not present

## 2021-07-20 DIAGNOSIS — R000 Tachycardia, unspecified: Secondary | ICD-10-CM | POA: Diagnosis not present

## 2021-07-20 DIAGNOSIS — J029 Acute pharyngitis, unspecified: Secondary | ICD-10-CM | POA: Diagnosis present

## 2021-07-20 LAB — GROUP A STREP BY PCR: Group A Strep by PCR: DETECTED — AB

## 2021-07-20 MED ORDER — LIDOCAINE VISCOUS HCL 2 % MT SOLN
15.0000 mL | Freq: Once | OROMUCOSAL | Status: AC
  Administered 2021-07-20: 15 mL via OROMUCOSAL
  Filled 2021-07-20: qty 15

## 2021-07-20 MED ORDER — AMOXICILLIN-POT CLAVULANATE 875-125 MG PO TABS: 1.0000 | ORAL_TABLET | Freq: Two times a day (BID) | ORAL | 0 refills | Status: DC

## 2021-07-20 MED ORDER — DEXAMETHASONE 1 MG/ML PO CONC
10.0000 mg | Freq: Once | ORAL | Status: DC
  Filled 2021-07-20: qty 10

## 2021-07-20 MED ORDER — DEXAMETHASONE 10 MG/ML FOR PEDIATRIC ORAL USE
INTRAMUSCULAR | Status: AC
  Administered 2021-07-20: 10 mg
  Filled 2021-07-20: qty 1

## 2021-07-20 MED ORDER — ACETAMINOPHEN 325 MG PO TABS
650.0000 mg | ORAL_TABLET | Freq: Once | ORAL | Status: AC
  Administered 2021-07-20: 650 mg via ORAL
  Filled 2021-07-20: qty 2

## 2021-07-20 NOTE — ED Provider Notes (Signed)
?[facility] DEPT ?Provider Note ? ? ?CSN: [id number] ?Arrival date & time: 07/20/21  0715 ? ?  ? ?History ? ?Chief Complaint  ?Patient presents with  ? Sore Throat  ? Chills  ? ? ?[name] is a 46 y.o. female with a past medical history significant for MS on Glatiramer Acetate who presents with two days of sore throat, chills, nasal drainage. Last took ibuprofen at 3a with minimal improve of throat pain. Patient endorses significant pain with swallowing but is tolerating oral secretions at this time.  She denies significant nausea, vomiting, body aches.  She had a home COVID test that was negative yesterday.  She reports that other than chills yesterday she has not had any objective fever, she does not have any chills at time of our evaluation.  She denies abdominal pain, diarrhea, dysuria.  She denies any chest pain, shortness of breath. ? ? ?Sore Throat ? ? ?  ? ?Home Medications ?Prior to Admission medications   ?Medication Sig Start Date End Date Taking? Authorizing Provider  ?amoxicillin-clavulanate (AUGMENTIN) 875-125 MG tablet Take 1 tablet by mouth every 12 (twelve) hours. 07/20/21  Yes Prakash Kimberling H, PA-C  ?aspirin 325 MG EC tablet Take 650 mg by mouth every 6 (six) hours as needed for pain.    [provider]  ?cholecalciferol (VITAMIN D3) 25 MCG (1000 UNIT) tablet Take 1 tablet (1,000 Units total) by mouth daily. To start after weekly Ergocalciferol is completed ?Patient taking differently: Take 5,000 Units by mouth daily. To start after weekly Ergocalciferol is completed 05/18/20   Domenic Polite, MD  ?Glatiramer Acetate 40 MG/ML SOSY INJECT ONE SYRINGE SUBCUTANEOUSLY 3 TIMES A WEEK AT LEAST 48 HOURS APART. ALLOW TO WARM TO ROOM TEMP FOR 20 MINUTES. REFRIGERATE. 03/30/21   Sater, Nanine Means, MD  ?meloxicam (MOBIC) 7.5 MG tablet Take 1 tablet (7.5 mg total) by mouth daily. 05/19/21   Lomax, Amy, NP  ?SUMAtriptan (IMITREX) 100 MG tablet Take 1 tablet (100  mg total) by mouth once as needed for up to 1 dose for migraine. May repeat in 2 hours if headache persists or recurs. 05/19/21   Debbora Presto, NP  ?Vitamin D, Ergocalciferol, (DRISDOL) 1.25 MG (50000 UNIT) CAPS capsule TAKE 1 CAPSULE (50,000 UNITS TOTAL) BY MOUTH EVERY 7 (SEVEN) DAYS 11/19/20   Sater, Nanine Means, MD  ?   ? ?Allergies    ?Pork-derived products   ? ?Review of Systems   ?Review of Systems  ?HENT:  Positive for congestion and sore throat.   ?All other systems reviewed and are negative. ? ?Physical Exam ?Updated Vital Signs ?BP (!) 148/107 (BP Location: Right Arm)   Pulse 99   Temp 98.4 ?F (36.9 ?C) (Oral)   Resp 17   Ht '5\' 3"'$  (1.6 m)   Wt 90.7 kg   SpO2 99%   BMI 35.43 kg/m?  ?Physical Exam ?Vitals and nursing note reviewed.  ?Constitutional:   ?   General: She is not in acute distress. ?   Appearance: Normal appearance.  ?HENT:  ?   Head: Normocephalic and atraumatic.  ?   Mouth/Throat:  ?   Comments: Tonsils 2-3+ bilaterally with white exudate. Some decreased ROM of jaw 2/2 pain without frank trismus. ? ?No evidence PTA, uvula midline. Intact swallow. ?Eyes:  ?   General:     ?   Right eye: No discharge.     ?   Left eye: No discharge.  ?Neck:  ?  Comments: Some left anterior cervical adenopathy ?Cardiovascular:  ?   Rate and Rhythm: Normal rate and regular rhythm.  ?Pulmonary:  ?   Effort: Pulmonary effort is normal. No respiratory distress.  ?Musculoskeletal:     ?   General: No deformity.  ?Lymphadenopathy:  ?   Cervical: Cervical adenopathy present.  ?Skin: ?   General: Skin is warm and dry.  ?Neurological:  ?   Mental Status: She is alert and oriented to person, place, and time.  ?Psychiatric:     ?   Mood and Affect: Mood normal.     ?   Behavior: Behavior normal.  ? ? ?ED Results / Procedures / Treatments   ?Labs ?(all labs ordered are listed, but only abnormal results are displayed) ?Labs Reviewed  ?GROUP A STREP BY PCR - Abnormal; Notable for the following components:  ?    Result Value   ? Group A Strep by PCR DETECTED (*)   ? All other components within normal limits  ?CULTURE, GROUP A STREP [facility])  ? ? ?EKG ?None ? ?Radiology ?No results found. ? ?Procedures ?Procedures  ? ? ?Medications Ordered in ED ?Medications  ?dexamethasone (DECADRON) 1 MG/ML solution 10 mg (10 mg Oral Not Given 07/20/21 0848)  ?acetaminophen (TYLENOL) tablet 650 mg (650 mg Oral Given 07/20/21 0813)  ?lidocaine (XYLOCAINE) 2 % viscous mouth solution 15 mL (15 mLs Mouth/Throat Given 07/20/21 0813)  ?dexamethasone (DECADRON) 10 MG/ML injection for Pediatric ORAL use (10 mg  Given 07/20/21 0813)  ? ? ?ED Course/ Medical Decision Making/ A&P ?  ?                        ?Medical Decision Making ?Risk ?OTC drugs. ?Prescription drug management. ? ? ?This is a 46 year old female who presents with significant sore throat over the last 2 days.  She has a past medical history significant for MS, denies any new flareups recently.  I reviewed recent outpatient neurology and behavioral health visits.  The emergent differential diagnosis includes streptococcal pharyngitis, peritonsillar abscess, Ludwig angina, acute epiglottitis, tracheitis, airway compromise versus other.  This is not an exhaustive differential.  On physical exam patient with 2-3+ tonsils bilaterally with no evidence of abscess, midline uvula.  She has some pain with opening of jaw but no frank trismus.  She is afebrile but does arrive with some tachycardia.  She has intact swallow reflex.  She does have white exudate of the tonsils.  Clinically high suspicion for strep pharyngitis.  Patient reports negative COVID test at home, no significant COVID or flu symptoms, we will forego COVID and flu testing at this time. ? ?PCR strep is positive.  Administered Decadron for tonsil swelling, and will prescribe Augmentin for strep pharyngitis.  Discussed one-time injection of penicillin with patient declines.  Patient has had a history of recurrent strep in the past so we will cover  with Augmentin instead of amoxicillin. ?Final Clinical Impression(s) / ED Diagnoses ?Final diagnoses:  ?Strep pharyngitis  ? ? ?Rx / DC Orders ?ED Discharge Orders   ? ?      Ordered  ?  amoxicillin-clavulanate (AUGMENTIN) 875-125 MG tablet  Every 12 hours       ? 07/20/21 0930  ? ?  ?  ? ?  ? ? ?  ?Anselmo Pickler, PA-C ?07/20/21 [id number] ? ?  ?Lacretia Leigh, MD ?07/21/21 [phone] ? ?

## 2021-07-20 NOTE — ED Triage Notes (Addendum)
Pt presents with sore throat, chills and nasal drained, started yesterday. Home Covid test (-) ?

## 2021-07-20 NOTE — Discharge Instructions (Addendum)
Please use Tylenol or ibuprofen for pain.  You may use 600 mg ibuprofen every 6 hours or 1000 mg of Tylenol every 6 hours.  You may choose to alternate between the 2.  This would be most effective.  Not to exceed 4 g of Tylenol within 24 hours.  Not to exceed 3200 mg ibuprofen 24 hours. ? ?Please take the entire course of antibiotics I am prescribing.  You may want to take a probiotic to prevent any stomach upset.  I recommend recheck with your primary care to ensure resolution.  You are concerned about worsening throat pain, difficulty swallowing please return to the emergency department for further evaluation. ? ?It was a pleasure taking care of you today I hope that you feel better soon. ?

## 2021-07-22 LAB — CULTURE, GROUP A STREP (THRC)

## 2021-07-23 ENCOUNTER — Telehealth: Payer: Self-pay

## 2021-07-23 NOTE — Telephone Encounter (Signed)
Post ED Visit - Positive Culture Follow-up ? ?Culture report reviewed by antimicrobial stewardship pharmacist: ?[facility] Team ?'[]'$  Elenor Quinones, Pharm.D. ?'[]'$  Heide Guile, Pharm.D., BCPS AQ-ID ?'[]'$  Parks Neptune, Pharm.D., BCPS ?'[x]'$  Alycia Rossetti, Pharm.D., BCPS ?'[]'$  [place], Pharm.D., BCPS, AAHIVP ?'[]'$  Legrand Como, Pharm.D., BCPS, AAHIVP ?'[]'$  Salome Arnt, PharmD, BCPS ?'[]'$  Johnnette Gourd, PharmD, BCPS ?'[]'$  Hughes Better, PharmD, BCPS ?'[]'$  Leeroy Cha, PharmD ?'[]'$  Laqueta Linden, PharmD, BCPS ?'[]'$  Albertina Parr, PharmD ? ?[facility] Team ?'[]'$  Leodis Sias, PharmD ?'[]'$  Lindell Spar, PharmD ?'[]'$  Royetta Asal, PharmD ?'[]'$  Graylin Shiver, Rph ?'[]'$  Rema Fendt) Glennon Mac, PharmD ?'[]'$  Arlyn Dunning, PharmD ?'[]'$  Netta Cedars, PharmD ?'[]'$  Dia Sitter, PharmD ?'[]'$  Leone Haven, PharmD ?'[]'$  Gretta Arab, PharmD ?'[]'$  Theodis Shove, PharmD ?'[]'$  Peggyann Juba, PharmD ?'[]'$  Reuel Boom, PharmD ? ? ?Positive throat culture ?Treated with Amoxicillin-Pot Clavulanate, organism sensitive to the same and no further patient follow-up is required at this time. ? ?Glennon Hamilton ?07/23/2021, 11:41 AM ?  ?

## 2021-10-02 ENCOUNTER — Emergency Department (HOSPITAL_COMMUNITY)
Admission: EM | Admit: 2021-10-02 | Discharge: 2021-10-02 | Disposition: A | Discharge status: Home / Self Care | Payer: Self-pay | Attending: Emergency Medicine | Admitting: Emergency Medicine

## 2021-10-02 ENCOUNTER — Emergency Department (HOSPITAL_COMMUNITY): Payer: BC Managed Care – PPO

## 2021-10-02 ENCOUNTER — Other Ambulatory Visit: Payer: Self-pay

## 2021-10-02 ENCOUNTER — Encounter (HOSPITAL_COMMUNITY): Payer: Self-pay

## 2021-10-02 DIAGNOSIS — R911 Solitary pulmonary nodule: Secondary | ICD-10-CM | POA: Insufficient documentation

## 2021-10-02 DIAGNOSIS — Z91040 Latex allergy status: Secondary | ICD-10-CM | POA: Insufficient documentation

## 2021-10-02 DIAGNOSIS — N9489 Other specified conditions associated with female genital organs and menstrual cycle: Secondary | ICD-10-CM | POA: Insufficient documentation

## 2021-10-02 DIAGNOSIS — R079 Chest pain, unspecified: Secondary | ICD-10-CM | POA: Insufficient documentation

## 2021-10-02 DIAGNOSIS — R0789 Other chest pain: Secondary | ICD-10-CM | POA: Diagnosis not present

## 2021-10-02 LAB — I-STAT BETA HCG BLOOD, ED (MC, WL, AP ONLY): I-stat hCG, quantitative: <5 m[IU]/mL (ref <5)

## 2021-10-02 LAB — BASIC METABOLIC PANEL
Anion gap: 4 — ABNORMAL LOW (ref 5–15)
BUN: 12 mg/dL (ref 6–20)
CO2: 27 mmol/L (ref 22–32)
Calcium: 8.4 mg/dL — ABNORMAL LOW (ref 8.9–10.3)
Chloride: 106 mmol/L (ref 98–111)
Creatinine, Ser: 0.68 mg/dL (ref 0.44–1.00)
GFR, Estimated: >60 mL/min (ref >60)
Glucose, Bld: 92 mg/dL (ref 70–99)
Potassium: 3.4 mmol/L — ABNORMAL LOW (ref 3.5–5.1)
Sodium: 137 mmol/L (ref 135–145)

## 2021-10-02 LAB — CBC
HCT: 36 % (ref 36.0–46.0)
Hemoglobin: 10.6 g/dL — ABNORMAL LOW (ref 12.0–15.0)
MCH: 21.6 pg — ABNORMAL LOW (ref 26.0–34.0)
MCHC: 29.4 g/dL — ABNORMAL LOW (ref 30.0–36.0)
MCV: 73.5 fL — ABNORMAL LOW (ref 80.0–100.0)
Platelets: 326 10*3/uL (ref 150–400)
RBC: 4.9 MIL/uL (ref 3.87–5.11)
RDW: 15.1 % (ref 11.5–15.5)
WBC: 5.2 10*3/uL (ref 4.0–10.5)
nRBC: 0 % (ref 0.0–0.2)

## 2021-10-02 LAB — TROPONIN I (HIGH SENSITIVITY): Troponin I (High Sensitivity): 2 ng/L (ref <18)

## 2021-10-02 IMAGING — CR DG CHEST 2V
2 series · 2 of 2 positions shown · non-contrast
Comparison: [DATE]

CLINICAL DATA: Chest pain

EXAM:
CHEST - 2 VIEW

[w chest pa]
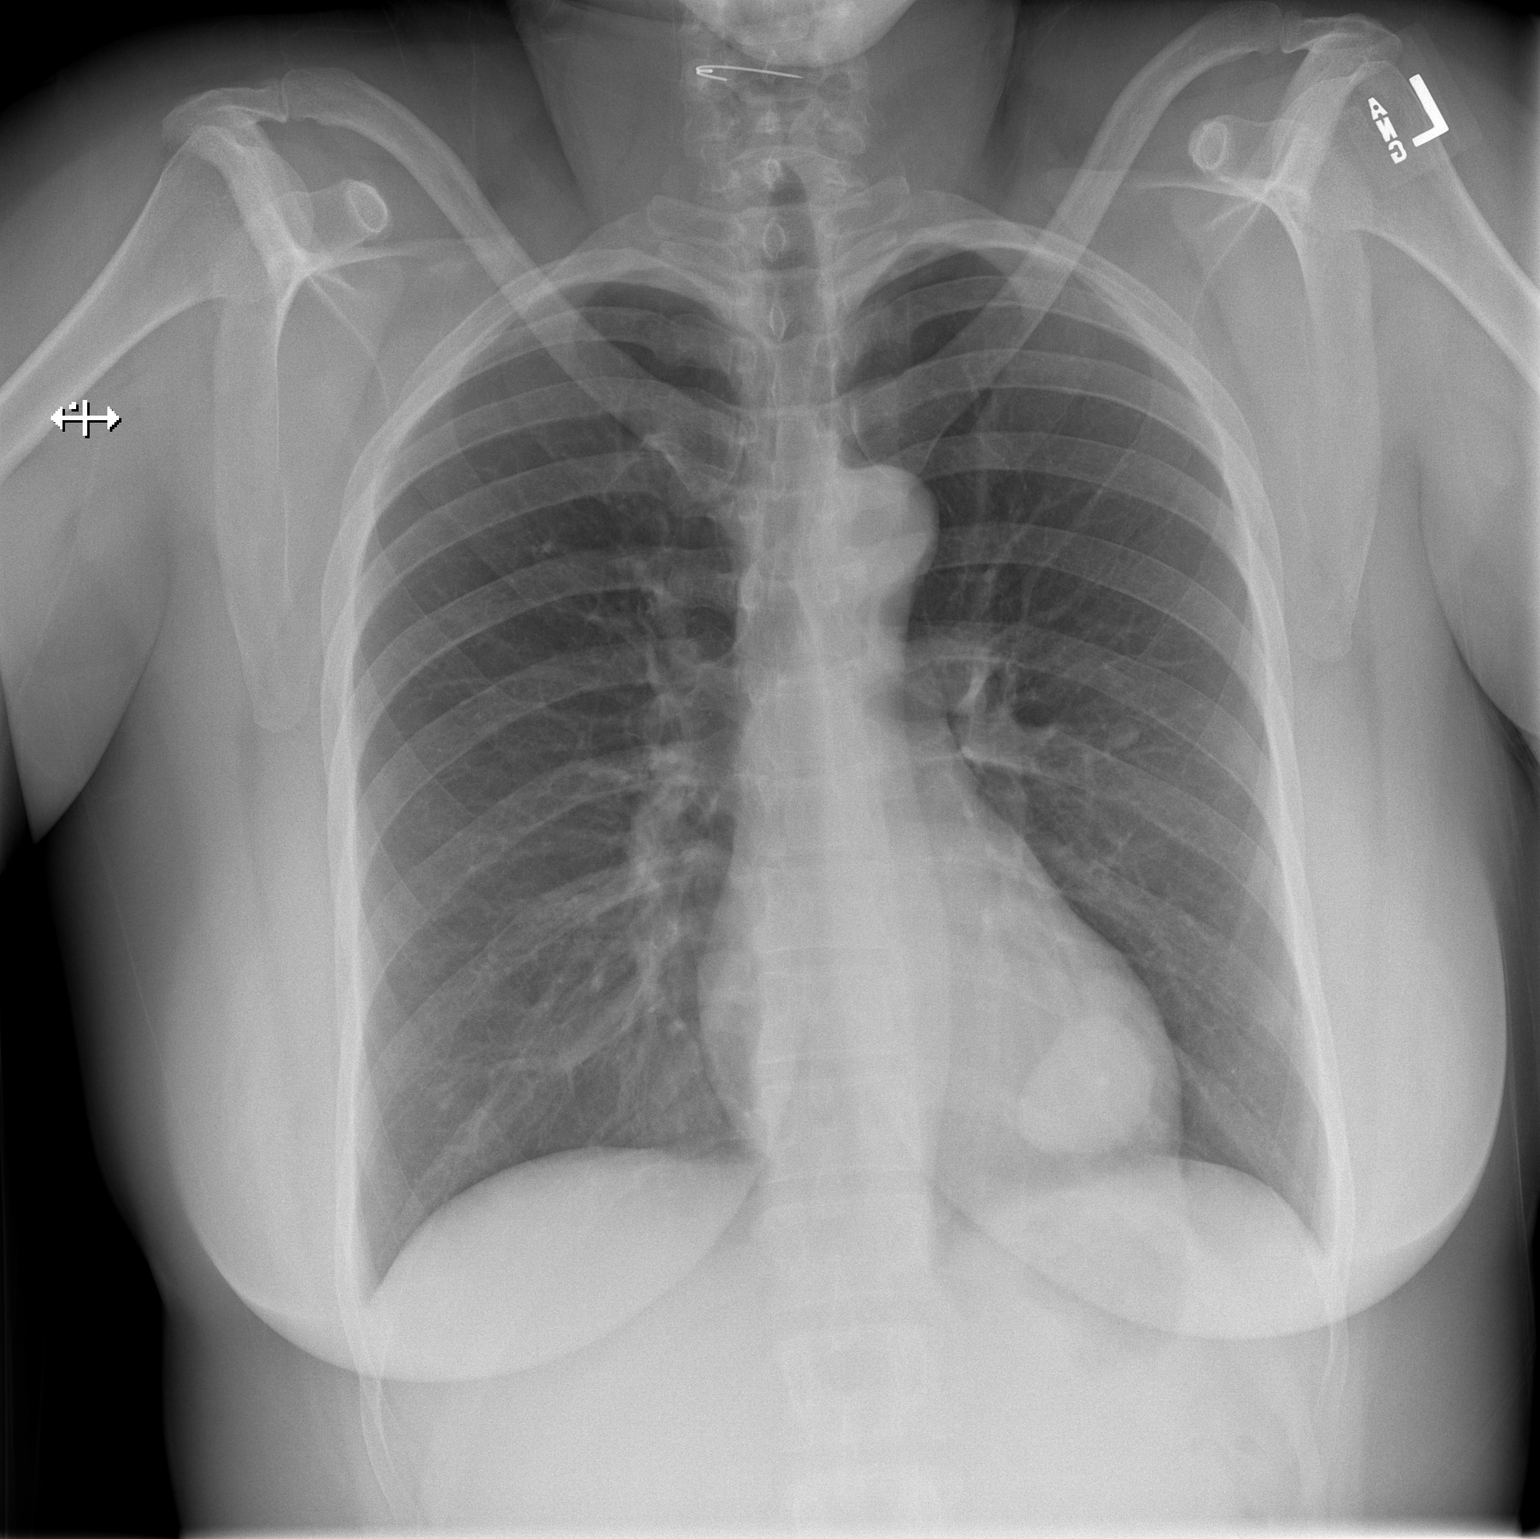

[w chest lat]
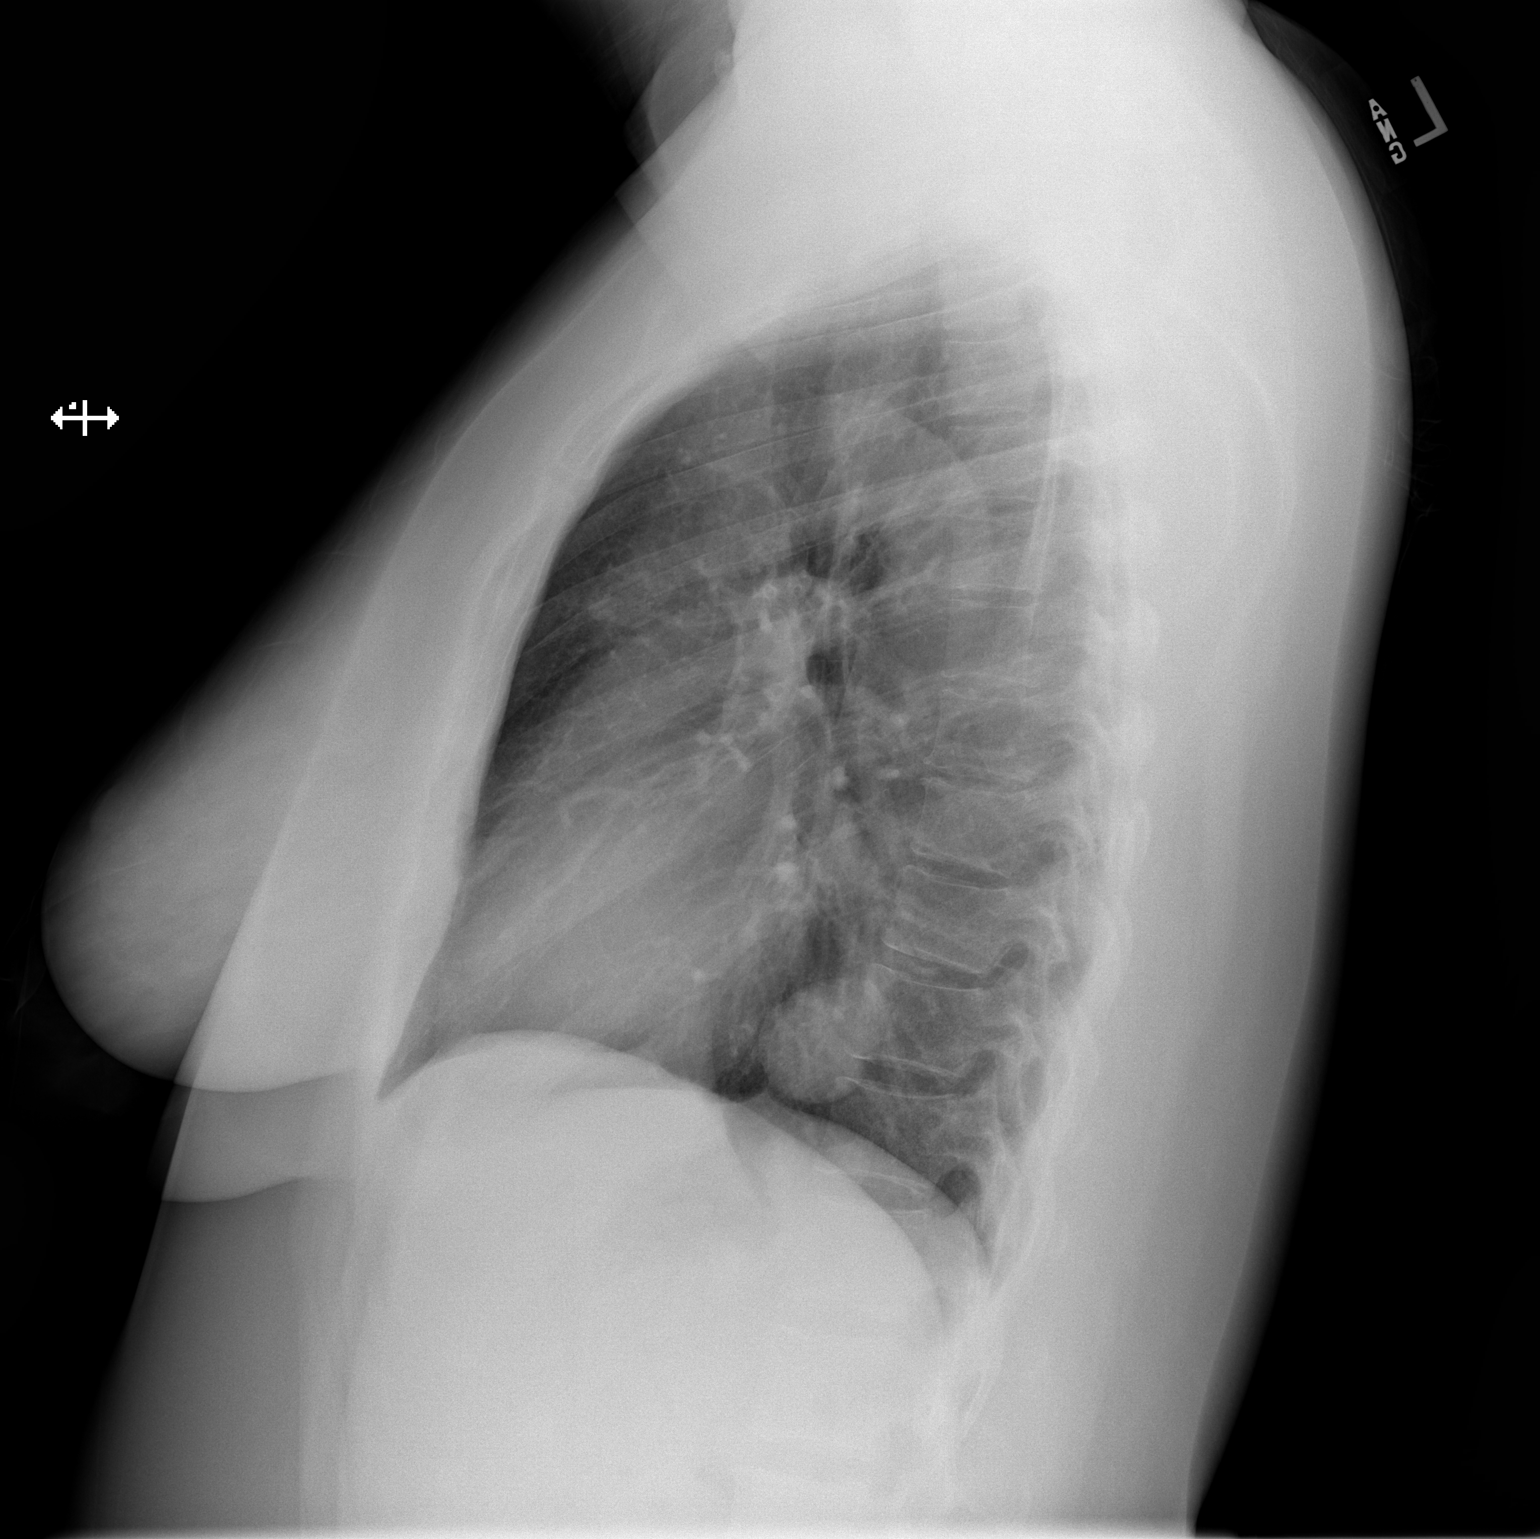

[2 of 2 positions shown; findings below may reference images not displayed]

FINDINGS: The lungs are clear without focal pneumonia, edema, pneumothorax or
pleural effusion. Left lower lobe pulmonary mass is again identified
measuring 4.2 cm today compared to 3.5 cm previously. The
cardiopericardial silhouette is within normal limits for size. The
visualized bony structures of the thorax are unremarkable.
Curvilinear radiopaque foreign body projects over the mid neck
region.
IMPRESSION: 1. No acute cardiopulmonary findings.
2. Interval progression of the left lower lobe pulmonary mass. This
was evaluated by chest CT on [DATE] and PET-CT on [DATE].
Nodule was described as showing mild hypermetabolism on the PET-CT.
3. Curvilinear radiopaque foreign body projects over the mid neck
region.

## 2021-10-02 MED ORDER — POTASSIUM CHLORIDE CRYS ER 20 MEQ PO TBCR
40.0000 meq | EXTENDED_RELEASE_TABLET | Freq: Once | ORAL | Status: AC
  Administered 2021-10-02: 40 meq via ORAL
  Filled 2021-10-02: qty 2

## 2021-10-02 MED ORDER — DEXAMETHASONE 4 MG PO TABS
10.0000 mg | ORAL_TABLET | Freq: Once | ORAL | Status: AC
  Administered 2021-10-02: 10 mg via ORAL
  Filled 2021-10-02: qty 1

## 2021-10-02 NOTE — Discharge Instructions (Addendum)
Your prior pulmonary nodule appears to have enlarged.  Continue follow-up with your pulmonary specialist at [facility].  I advise you call them this week to make an appointment.  Return back to the ER if you have difficulty breathing worsening symptoms or any additional concerns.

## 2021-10-02 NOTE — ED Provider Notes (Signed)
[facility] DEPT Provider Note   CSN: [id number] Arrival date & time: 10/02/21  0654     History  Chief Complaint  Patient presents with   Chest Pain   Shortness of Breath   Hoarse    [name] is a 46 y.o. female.  Patient presents chief complaint of cough congestion sore throat associate with chest pain.  She states symptoms been going on for 2 to 3 days.  Denies any fevers.  Denies any vomiting or diarrhea.  Presents to the ER for concern for this chest tightness/pain which she describes as constant for the past 2 to 3 days.  Symptoms made worse when she coughs per patient.      Home Medications Prior to Admission medications   Medication Sig Start Date End Date Taking? Authorizing Provider  Ascorbic Acid (VITAMIN C PO) Take 2 tablets by mouth daily.   Yes [provider]  Calcium Carbonate Antacid (TUMS PO) Take 2-6 tablets by mouth See admin instructions. Take 2-6 tablets by mouth 1-3 times a day as needed acid reflux   Yes [provider]  Cyanocobalamin (VITAMIN B-12 PO) Take 2 capsules by mouth daily.   Yes [provider]  ELDERBERRY PO Take 10 mLs by mouth daily. Elderberry syrup   Yes [provider]  Fexofenadine-Pseudoephedrine (ALLEGRA-D PO) Take 1 tablet by mouth daily.   Yes [provider]  Glatiramer Acetate 40 MG/ML SOSY INJECT ONE SYRINGE SUBCUTANEOUSLY 3 TIMES A WEEK AT LEAST 48 HOURS APART. ALLOW TO WARM TO ROOM TEMP FOR 20 MINUTES. REFRIGERATE. Patient taking differently: Inject 40 mg into the skin See admin instructions. Inject 40 mg subcutaneously 3 times a week. 03/30/21  Yes Sater, Nanine Means, MD  meloxicam (MOBIC) 7.5 MG tablet Take 1 tablet (7.5 mg total) by mouth daily. Patient taking differently: Take 7.5 mg by mouth daily as needed (migraines). 05/19/21  Yes Lomax, Amy, NP  OVER THE COUNTER MEDICATION Take 10 mLs by mouth daily. Seamoss gel   Yes [provider]   SUMAtriptan (IMITREX) 100 MG tablet Take 1 tablet (100 mg total) by mouth once as needed for up to 1 dose for migraine. May repeat in 2 hours if headache persists or recurs. 05/19/21  Yes Lomax, Amy, NP  amoxicillin-clavulanate (AUGMENTIN) 875-125 MG tablet Take 1 tablet by mouth every 12 (twelve) hours. Patient not taking: Reported on 10/02/2021 07/20/21   Prosperi, Christian H, PA-C  cholecalciferol (VITAMIN D3) 25 MCG (1000 UNIT) tablet Take 1 tablet (1,000 Units total) by mouth daily. To start after weekly Ergocalciferol is completed Patient not taking: Reported on 10/02/2021 05/18/20   Domenic Polite, MD  Vitamin D, Ergocalciferol, (DRISDOL) 1.25 MG (50000 UNIT) CAPS capsule TAKE 1 CAPSULE (50,000 UNITS TOTAL) BY MOUTH EVERY 7 (SEVEN) DAYS Patient not taking: Reported on 10/02/2021 11/19/20   Sater, Nanine Means, MD  zonisamide (ZONEGRAN) 100 MG capsule Take 100 mg by mouth at bedtime. Patient not taking: Reported on 10/02/2021 05/20/21   [provider]      Allergies    Pork-derived products and Latex    Review of Systems   Review of Systems  Constitutional:  Negative for fever.  HENT:  Negative for ear pain.   Eyes:  Negative for pain.  Respiratory:  Positive for cough.   Cardiovascular:  Positive for chest pain.  Gastrointestinal:  Negative for abdominal pain.  Genitourinary:  Negative for flank pain.  Musculoskeletal:  Negative for back pain.  Skin:  Negative for rash.  Neurological:  Negative for headaches.   Physical Exam Updated Vital Signs BP (!) 154/84   Pulse 73   Temp 98.2 F (36.8 C) (Oral)   Resp 16   Ht '5\' 7"'$  (1.702 m)   Wt 90.3 kg   SpO2 100%   BMI 31.17 kg/m  Physical Exam Constitutional:      General: She is not in acute distress.    Appearance: Normal appearance.  HENT:     Head: Normocephalic.     Nose: Nose normal.  Eyes:     Extraocular Movements: Extraocular movements intact.  Cardiovascular:     Rate and Rhythm: Normal rate.  Pulmonary:      Effort: Pulmonary effort is normal. No tachypnea or respiratory distress.     Breath sounds: No decreased breath sounds, wheezing or rhonchi.  Musculoskeletal:        General: Normal range of motion.     Cervical back: Normal range of motion.  Neurological:     General: No focal deficit present.     Mental Status: She is alert. Mental status is at baseline.    ED Results / Procedures / Treatments   Labs (all labs ordered are listed, but only abnormal results are displayed) Labs Reviewed  BASIC METABOLIC PANEL - Abnormal; Notable for the following components:      Result Value   Potassium 3.4 (*)    Calcium 8.4 (*)    Anion gap 4 (*)    All other components within normal limits  CBC - Abnormal; Notable for the following components:   Hemoglobin 10.6 (*)    MCV 73.5 (*)    MCH 21.6 (*)    MCHC 29.4 (*)    All other components within normal limits  I-STAT BETA HCG BLOOD, ED ([facility], [facility], [facility] ONLY)  TROPONIN I (HIGH SENSITIVITY)    EKG EKG Interpretation  Date/Time:  Saturday Oct 02 2021 07:44:57 EDT Ventricular Rate:  70 PR Interval:  166 QRS Duration: 82 QT Interval:  392 QTC Calculation: 423 R Axis:   62 Text Interpretation: Sinus rhythm Confirmed by Thamas Jaegers ([id number]) on 10/02/2021 8:20:23 AM  Radiology DG Chest 2 View  Result Date: 10/02/2021 CLINICAL DATA:  Chest pain EXAM: CHEST - 2 VIEW COMPARISON:  02/17/2021 FINDINGS: The lungs are clear without focal pneumonia, edema, pneumothorax or pleural effusion. Left lower lobe pulmonary mass is again identified measuring 4.2 cm today compared to 3.5 cm previously. The cardiopericardial silhouette is within normal limits for size. The visualized bony structures of the thorax are unremarkable. Curvilinear radiopaque foreign body projects over the mid neck region. IMPRESSION: 1. No acute cardiopulmonary findings. 2. Interval progression of the left lower lobe pulmonary mass. This was evaluated by chest CT on 02/22/2021 and  PET-CT on 03/10/2021. Nodule was described as showing mild hypermetabolism on the PET-CT. 3. Curvilinear radiopaque foreign body projects over the mid neck region. Electronically Signed   By: Misty Stanley M.D.   On: 10/02/2021 07:30    Procedures Procedures    Medications Ordered in ED Medications  potassium chloride SA (KLOR-CON M) CR tablet 40 mEq (has no administration in time range)  dexamethasone (DECADRON) tablet 10 mg (has no administration in time range)    ED Course/ Medical Decision Making/ A&P                           Medical Decision Making Amount and/or Complexity of Data  Reviewed Labs: ordered. Radiology: ordered.  Risk Prescription drug management.   Review of records shows diagnosis of strep throat March 2023.  Cardiac monitoring showing sinus rhythm.  Tenderness studies includes labs.  Troponin is negative.  Chemistry remarkable white count 5 hemoglobin 10.  Chest x-ray concerning for progression of known pulmonary nodule.  Review of her chart shows that she had seen physicians at [facility] regarding her pulmonary nodule.  Patient advised to follow-up with her medical group again given the enlargement of the nodule.  Advised return to the ER if she has worsening symptoms fevers difficulty breathing or any other concerns.        Final Clinical Impression(s) / ED Diagnoses Final diagnoses:  Chest pain, unspecified type  Pulmonary nodule    Rx / DC Orders ED Discharge Orders     None         Luna Fuse, MD 10/02/21 [phone]

## 2021-10-02 NOTE — ED Triage Notes (Signed)
Pt states that she has been having allergy symptoms for the past 2-3 days, now her voice is hoarse and she is having chest pain. Pt states that she was initially coughing up yellow mucus, now it is clear. Pt reports a hx of MS.

## 2021-10-07 DIAGNOSIS — R052 Subacute cough: Secondary | ICD-10-CM | POA: Diagnosis not present

## 2021-10-07 DIAGNOSIS — R519 Headache, unspecified: Secondary | ICD-10-CM | POA: Diagnosis not present

## 2021-10-12 ENCOUNTER — Telehealth: Payer: Self-pay | Admitting: Family Medicine

## 2021-10-12 NOTE — Telephone Encounter (Signed)
Pt said pharmacy informed pt need a PA for Glatiramer Acetate 40 MG/ML SOSY.   New Estate manager/land agent Member ID: [id number] State ID# [id number] RxBIN# [id number] RxPCN#[id number]

## 2021-10-12 NOTE — Telephone Encounter (Signed)
PA submitted on CMM. Key: [id number]. Waiting on determination.

## 2021-10-13 NOTE — Telephone Encounter (Signed)
Called pt back. Advised PA denied. With her new insurance, preferred drugs are Copaxone (brand only), Anovex, Betaseron, Rebif. She is agreeable to change to brand name. Copaxone start form emailed to her at [email or web address]. She will sign and fax back to me.

## 2021-10-16 ENCOUNTER — Emergency Department (HOSPITAL_COMMUNITY): Payer: Medicaid Other

## 2021-10-16 ENCOUNTER — Other Ambulatory Visit: Payer: Self-pay

## 2021-10-16 ENCOUNTER — Emergency Department (HOSPITAL_COMMUNITY)
Admission: EM | Admit: 2021-10-16 | Discharge: 2021-10-16 | Disposition: A | Discharge status: Home / Self Care | Payer: Medicaid Other | Attending: Emergency Medicine | Admitting: Emergency Medicine

## 2021-10-16 ENCOUNTER — Encounter (HOSPITAL_COMMUNITY): Payer: Self-pay | Admitting: Emergency Medicine

## 2021-10-16 DIAGNOSIS — R519 Headache, unspecified: Secondary | ICD-10-CM | POA: Diagnosis not present

## 2021-10-16 DIAGNOSIS — R209 Unspecified disturbances of skin sensation: Secondary | ICD-10-CM | POA: Diagnosis not present

## 2021-10-16 DIAGNOSIS — R079 Chest pain, unspecified: Secondary | ICD-10-CM

## 2021-10-16 DIAGNOSIS — G35 Multiple sclerosis: Secondary | ICD-10-CM | POA: Diagnosis not present

## 2021-10-16 DIAGNOSIS — M545 Low back pain, unspecified: Secondary | ICD-10-CM | POA: Insufficient documentation

## 2021-10-16 DIAGNOSIS — Z91040 Latex allergy status: Secondary | ICD-10-CM | POA: Insufficient documentation

## 2021-10-16 DIAGNOSIS — R202 Paresthesia of skin: Secondary | ICD-10-CM | POA: Diagnosis not present

## 2021-10-16 DIAGNOSIS — R0789 Other chest pain: Secondary | ICD-10-CM | POA: Diagnosis not present

## 2021-10-16 DIAGNOSIS — R0602 Shortness of breath: Secondary | ICD-10-CM | POA: Insufficient documentation

## 2021-10-16 DIAGNOSIS — M542 Cervicalgia: Secondary | ICD-10-CM | POA: Diagnosis not present

## 2021-10-16 DIAGNOSIS — M47812 Spondylosis without myelopathy or radiculopathy, cervical region: Secondary | ICD-10-CM | POA: Diagnosis not present

## 2021-10-16 DIAGNOSIS — M79629 Pain in unspecified upper arm: Secondary | ICD-10-CM | POA: Diagnosis not present

## 2021-10-16 DIAGNOSIS — R5383 Other fatigue: Secondary | ICD-10-CM | POA: Diagnosis not present

## 2021-10-16 DIAGNOSIS — R059 Cough, unspecified: Secondary | ICD-10-CM | POA: Diagnosis not present

## 2021-10-16 DIAGNOSIS — R918 Other nonspecific abnormal finding of lung field: Secondary | ICD-10-CM | POA: Diagnosis not present

## 2021-10-16 LAB — COMPREHENSIVE METABOLIC PANEL
ALT: 14 U/L (ref 0–44)
AST: 18 U/L (ref 15–41)
Albumin: 3.4 g/dL — ABNORMAL LOW (ref 3.5–5.0)
Alkaline Phosphatase: 49 U/L (ref 38–126)
Anion gap: 4 — ABNORMAL LOW (ref 5–15)
BUN: 11 mg/dL (ref 6–20)
CO2: 27 mmol/L (ref 22–32)
Calcium: 8.9 mg/dL (ref 8.9–10.3)
Chloride: 108 mmol/L (ref 98–111)
Creatinine, Ser: 0.59 mg/dL (ref 0.44–1.00)
GFR, Estimated: >60 mL/min (ref >60)
Glucose, Bld: 103 mg/dL — ABNORMAL HIGH (ref 70–99)
Potassium: 4.1 mmol/L (ref 3.5–5.1)
Sodium: 139 mmol/L (ref 135–145)
Total Bilirubin: 0.4 mg/dL (ref 0.3–1.2)
Total Protein: 7.2 g/dL (ref 6.5–8.1)

## 2021-10-16 LAB — CBC WITH DIFFERENTIAL/PLATELET
Abs Immature Granulocytes: 0.02 10*3/uL (ref 0.00–0.07)
Basophils Absolute: 0 10*3/uL (ref 0.0–0.1)
Basophils Relative: 1 %
Eosinophils Absolute: 0.3 10*3/uL (ref 0.0–0.5)
Eosinophils Relative: 6 %
HCT: 37.8 % (ref 36.0–46.0)
Hemoglobin: 11.2 g/dL — ABNORMAL LOW (ref 12.0–15.0)
Immature Granulocytes: 0 %
Lymphocytes Relative: 28 %
Lymphs Abs: 1.4 10*3/uL (ref 0.7–4.0)
MCH: 22 pg — ABNORMAL LOW (ref 26.0–34.0)
MCHC: 29.6 g/dL — ABNORMAL LOW (ref 30.0–36.0)
MCV: 74.1 fL — ABNORMAL LOW (ref 80.0–100.0)
Monocytes Absolute: 0.2 10*3/uL (ref 0.1–1.0)
Monocytes Relative: 4 %
Neutro Abs: 3.2 10*3/uL (ref 1.7–7.7)
Neutrophils Relative %: 61 %
Platelets: 338 10*3/uL (ref 150–400)
RBC: 5.1 MIL/uL (ref 3.87–5.11)
RDW: 14.7 % (ref 11.5–15.5)
WBC: 5.2 10*3/uL (ref 4.0–10.5)
nRBC: 0 % (ref 0.0–0.2)

## 2021-10-16 LAB — TROPONIN I (HIGH SENSITIVITY)
Troponin I (High Sensitivity): 3 ng/L (ref <18)
Troponin I (High Sensitivity): 2 ng/L (ref <18)

## 2021-10-16 IMAGING — MR MR THORACIC SPINE WO/W CM
8 of 9 series · 34 of 48 positions shown · IV contrast (9 ML GADAVIST)
Comparison: Chest CTA [DATE].

CLINICAL DATA: Multiple sclerosis.

EXAM:
MRI THORACIC WITHOUT AND WITH CONTRAST
TECHNIQUE: Multiplanar and multiecho pulse sequences of the thoracic spine were
obtained without and with intravenous contrast.
CONTRAST:  9mL GADAVIST GADOBUTROL 1 MMOL/ML IV SOLN

[Series 16: T1 · sagittal · 4.0mm · 1.72mm/px · 1 of 8 slices shown (1 of 3)]
[im 1/8]
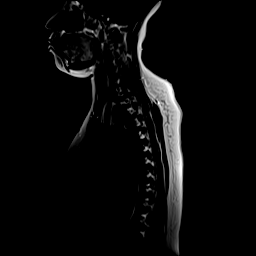

[Series 21: STIR · sagittal · 3.0mm · 1.00mm/px · 3 of 19 slices shown]
[im 1/19]
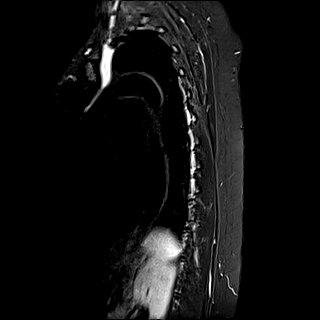
[im 10/19]
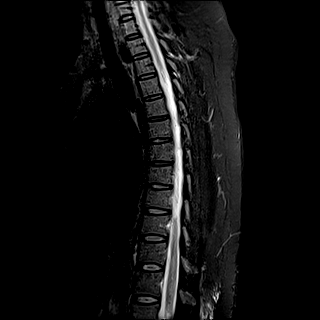
[im 19/19]
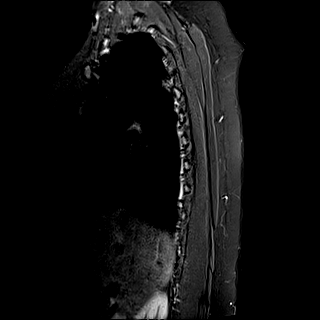

[Series 22: T1 · sagittal · 3.0mm · 1.00mm/px · 4 of 19 slices shown (2 of 3)]
[im 1/19]
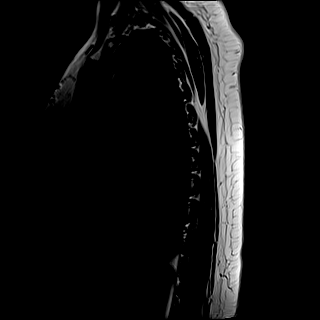
[im 7/19]
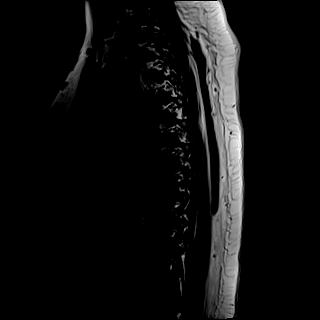
[im 13/19]
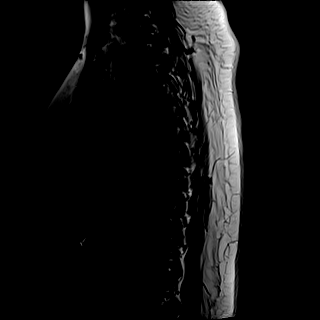
[im 19/19]
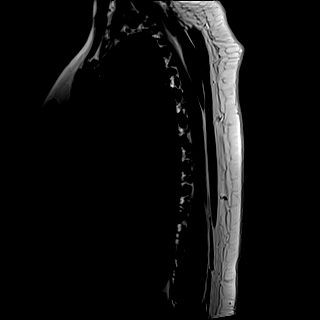

[Series 23: T2 · axial · 4.0mm · 0.78mm/px · z∈[-442,-179]mm · 8 of 39 slices shown]
[im 1/39]
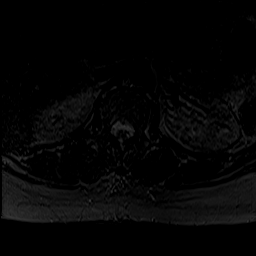
[im 6/39]
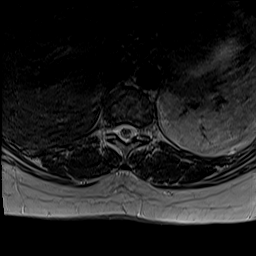
[im 11/39]
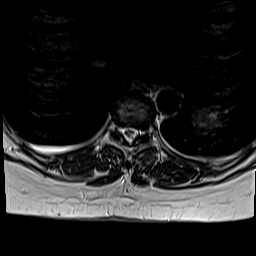
[im 17/39]
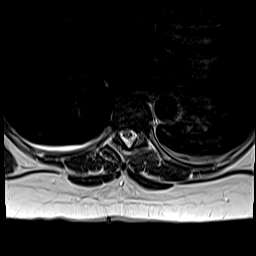
[im 22/39]
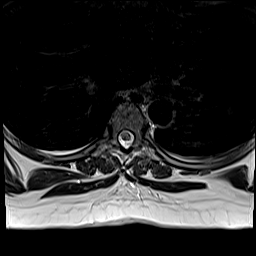
[im 28/39]
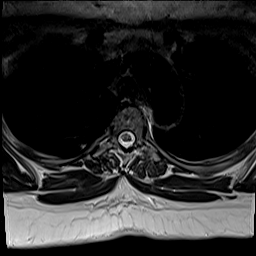
[im 33/39]
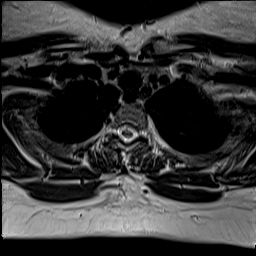
[im 39/39]
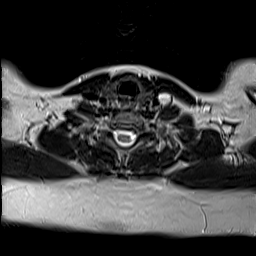

[Series 25: T1 · axial · 4.0mm · 0.39mm/px · z∈[-442,-179]mm · 8 of 39 slices shown (3 of 3)]
[im 1/39]
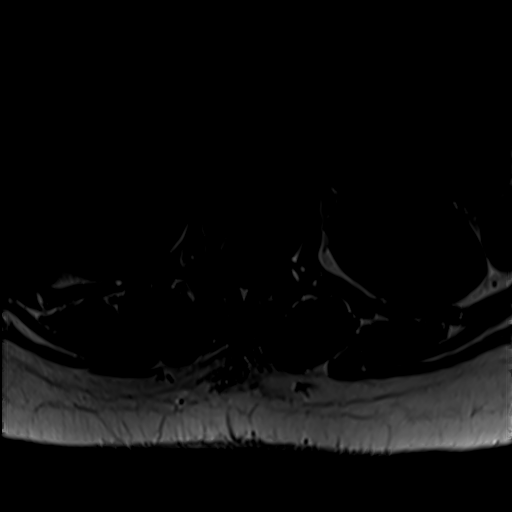
[im 6/39]
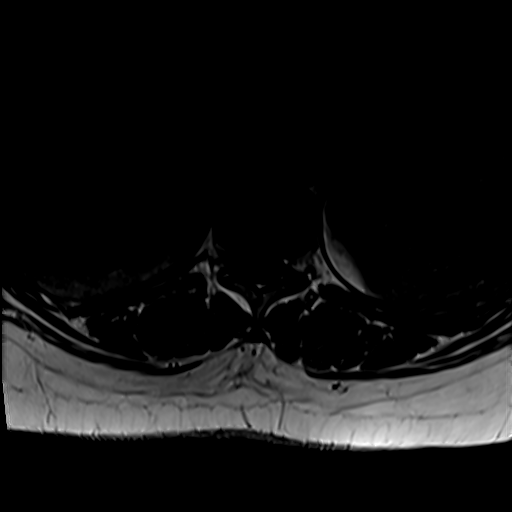
[im 11/39]
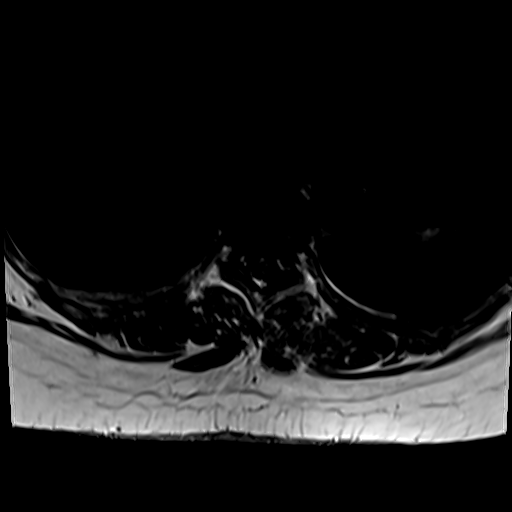
[im 17/39]
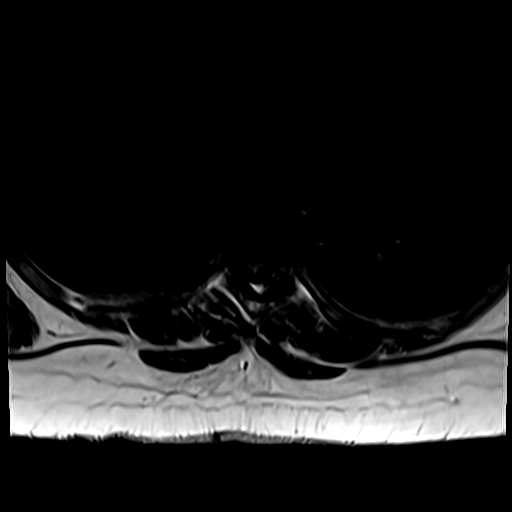
[im 22/39]
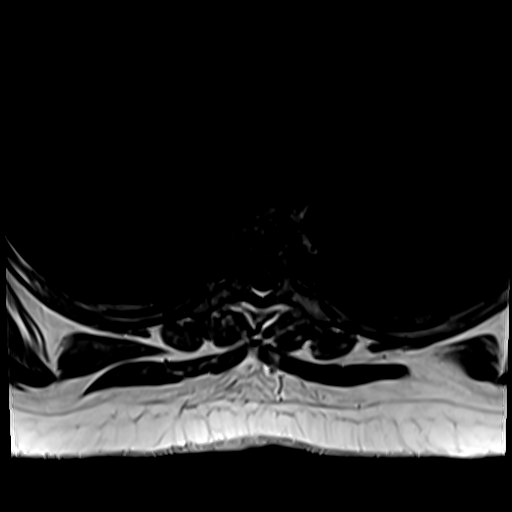
[im 28/39]
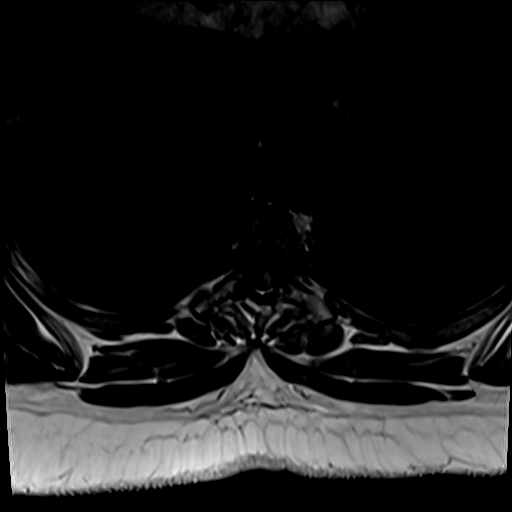
[im 33/39]
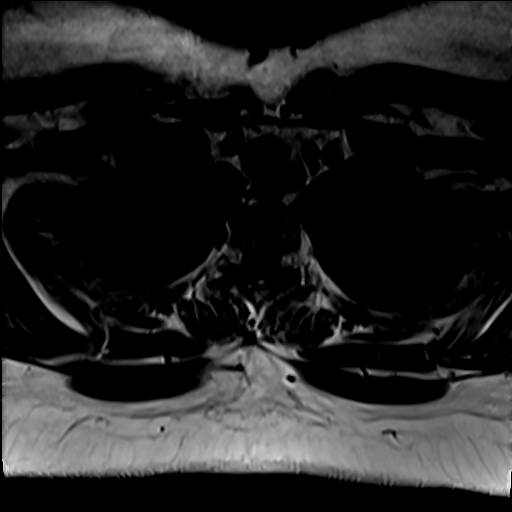
[im 39/39]
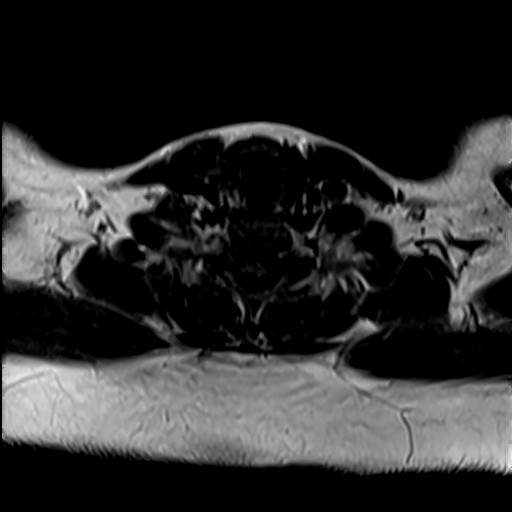

[Series 26: T2 post-contrast · sagittal · 3.0mm · 0.83mm/px · 4 of 19 slices shown]
[im 1/19]
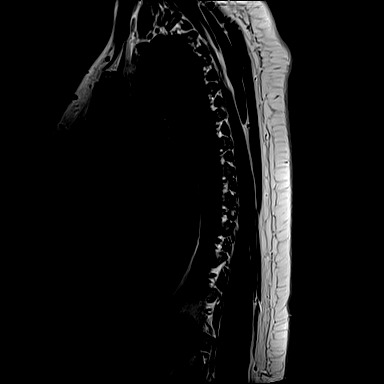
[im 7/19]
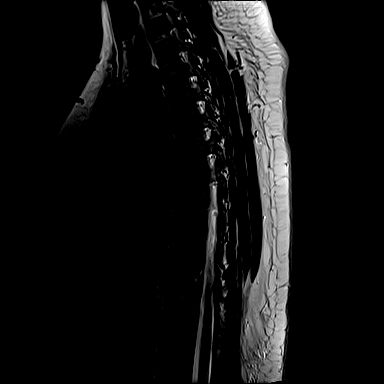
[im 13/19]
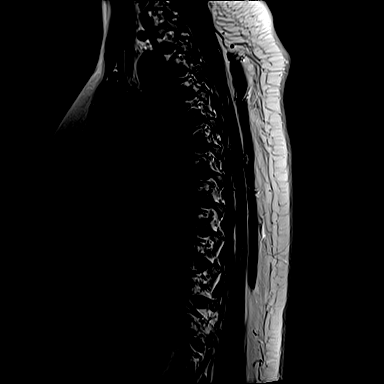
[im 19/19]
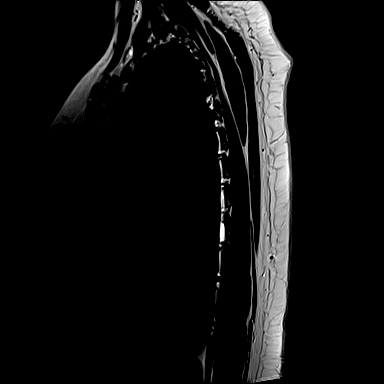

[Series 27: T1 fat-sat post-contrast · sagittal · 3.0mm · 1.00mm/px · 4 of 19 slices shown]
[im 1/19]
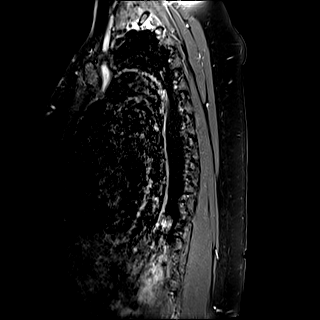
[im 7/19]
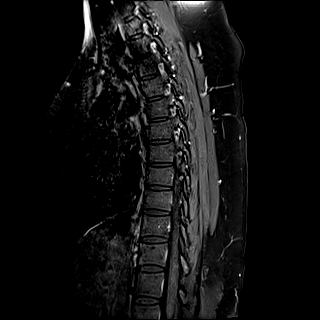
[im 13/19]
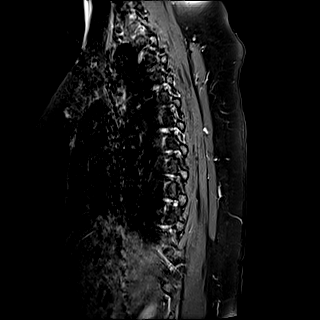
[im 19/19]
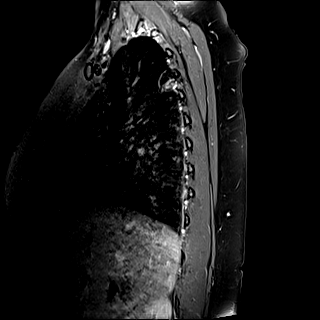

[Series 28: T1 post-contrast · axial · 4.0mm · 0.39mm/px · z∈[-442,-398]mm · 2 of 39 slices shown]
[im 1/39]
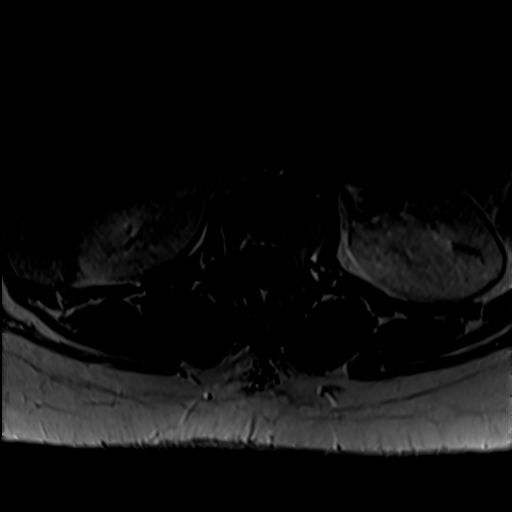
[im 6/39]
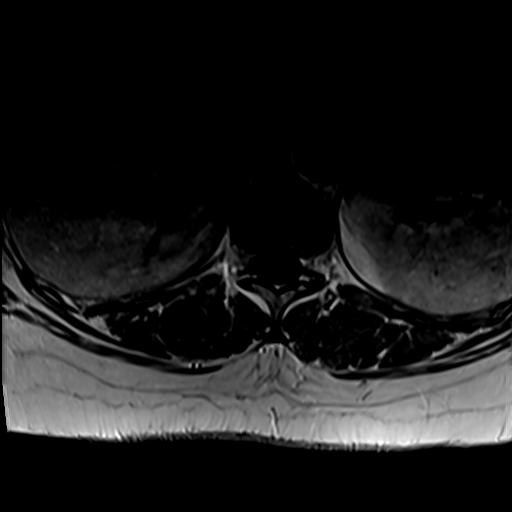

[34 of 48 positions shown; findings below may reference images not displayed]

FINDINGS: Alignment:  Normal.

Vertebrae: No fracture, suspicious marrow lesion, or significant
marrow edema.

Cord: Small T2 hyperintense lesions in the spinal cord at T4 and
T9-10. No associated enhancement. Normal spinal cord caliber.

Paraspinal and other soft tissues: Trace pleural fluid bilaterally.
3.4 cm left lower lobe lung mass, more fully evaluated on today's
chest CTA.

Disc levels:

Preserved disc space heights throughout. No disc herniation or
stenosis.
IMPRESSION: 1. Small lesions in the spinal cord at T4 and T9-10 consistent with
multiple sclerosis. No evidence of active demyelination.
2. Known left lower lobe pulmonary mass.

## 2021-10-16 IMAGING — MR MR HEAD WO/W CM
13 series · 48 of 48 positions shown · IV contrast (gadavist)
Comparison: Head MRI [DATE]

CLINICAL DATA: Multiple sclerosis.

EXAM:
MRI HEAD WITHOUT AND WITH CONTRAST
TECHNIQUE: Multiplanar, multiecho pulse sequences of the brain and surrounding
structures were obtained without and with intravenous contrast.
CONTRAST:  9mL GADAVIST GADOBUTROL 1 MMOL/ML IV SOLN

[Series 5: DWI · axial · 3.0mm · 1.36mm/px · z∈[-80,+71]mm · 5 of 104 slices shown (1 of 2)]
[im 1/104]
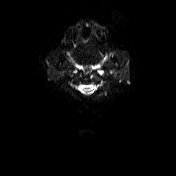
[im 26/104]
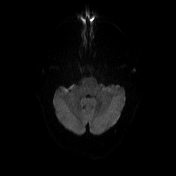
[im 52/104]
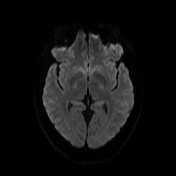
[im 78/104]
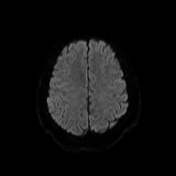
[im 104/104]
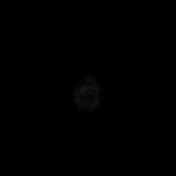

[Series 6: DWI · axial · 3.0mm · 1.36mm/px · z∈[-80,+71]mm · 2 of 52 slices shown (2 of 2)]
[im 1/52]
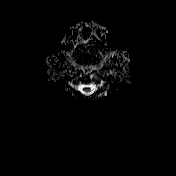
[im 52/52]
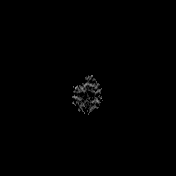

[Series 7: T1 · sagittal · 5.0mm · 0.75mm/px · 2 of 25 slices shown (1 of 2)]
[im 1/25]
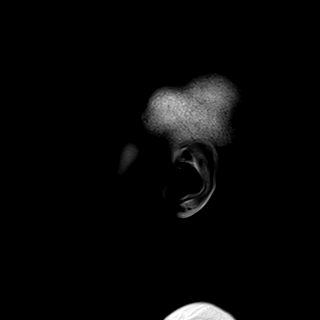
[im 25/25]
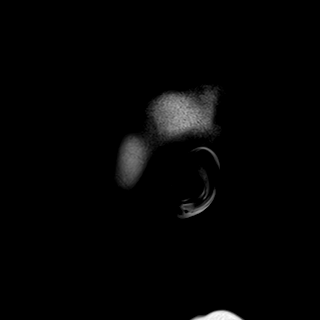

[Series 8: T2 · axial · 5.0mm · 0.62mm/px · z∈[-79,+76]mm · 2 of 25 slices shown]
[im 1/25]
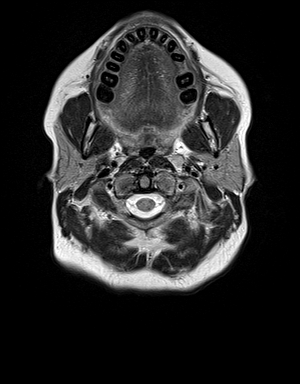
[im 25/25]
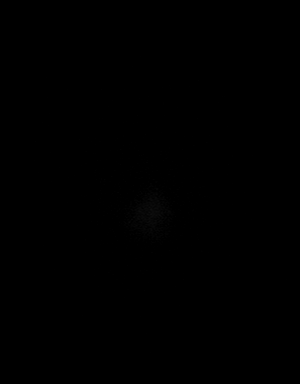

[Series 9: swi_images · axial · 3.0mm · 0.75mm/px · z∈[-77,+74]mm · 3 of 52 slices shown]
[im 1/52]
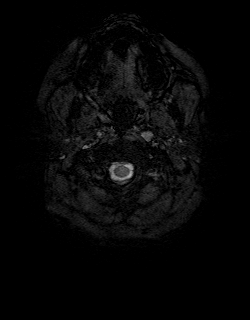
[im 26/52]
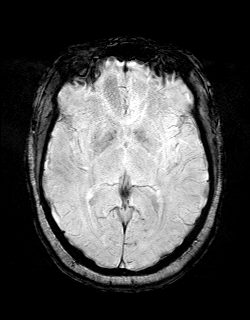
[im 52/52]
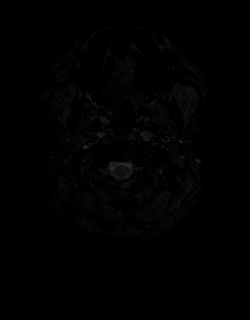

[Series 11: FLAIR · axial · 3.0mm · 0.75mm/px · z∈[-77,+74]mm · 3 of 52 slices shown]
[im 1/52]
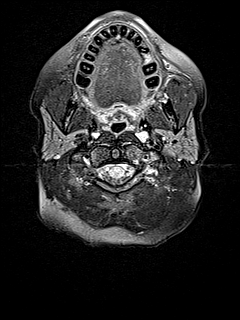
[im 26/52]
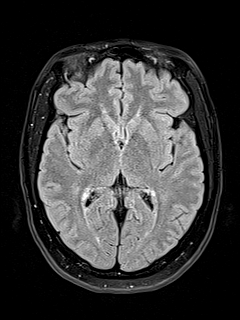
[im 52/52]
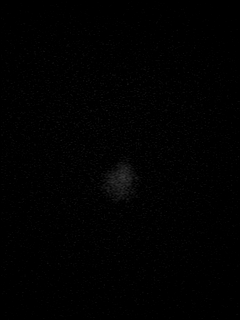

[Series 12: T1 · axial · 1.0mm · 0.94mm/px · z∈[-80,+77]mm · 10 of 160 slices shown (2 of 2)]
[im 1/160]
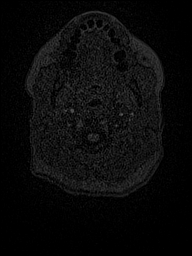
[im 18/160]
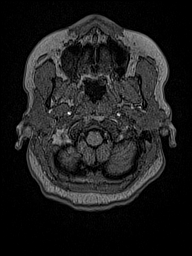
[im 36/160]
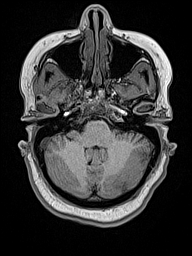
[im 54/160]
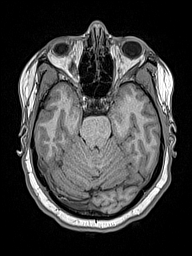
[im 71/160]
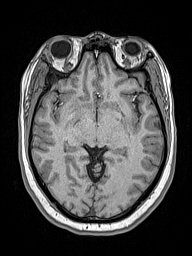
[im 89/160]
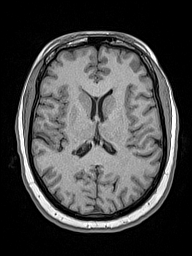
[im 107/160]
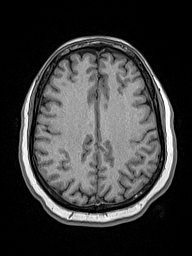
[im 124/160]
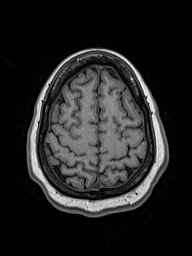
[im 142/160]
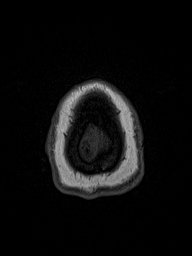
[im 160/160]
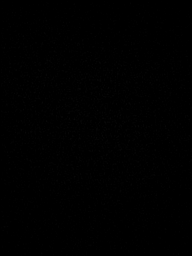

[Series 13: cor dwi_tracew · coronal · 5.0mm · 1.53mm/px · 3 of 56 slices shown]
[im 1/56]
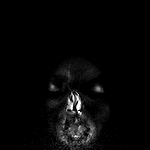
[im 28/56]
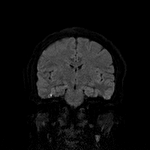
[im 56/56]
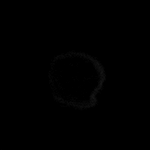

[Series 14: cor dwi_adc · coronal · 5.0mm · 1.53mm/px · 2 of 28 slices shown]
[im 1/28]
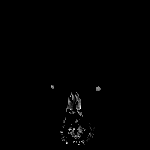
[im 28/28]
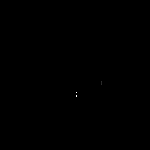

[Series 15: T2 post-contrast · coronal · 5.0mm · 0.57mm/px · 2 of 30 slices shown]
[im 1/30]
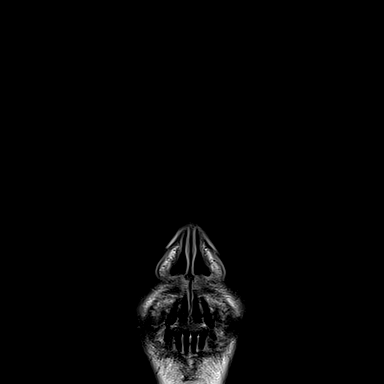
[im 30/30]
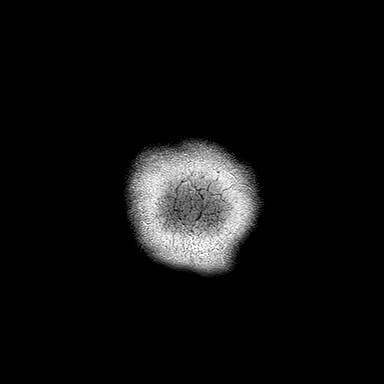

[Series 16: T1 post-contrast · axial · 1.0mm · 0.94mm/px · z∈[-80,+77]mm · 10 of 160 slices shown (1 of 3)]
[im 1/160]
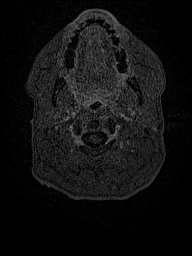
[im 18/160]
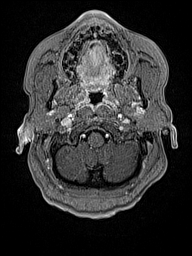
[im 36/160]
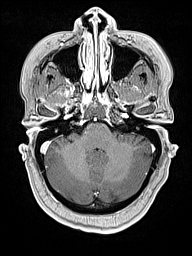
[im 54/160]
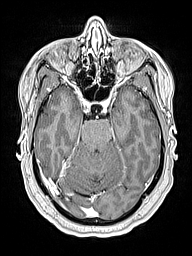
[im 71/160]
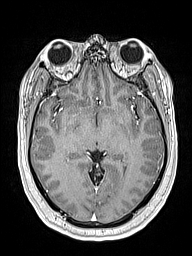
[im 89/160]
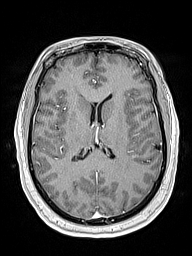
[im 107/160]
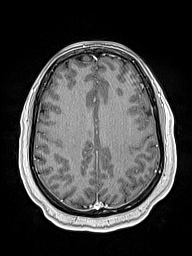
[im 124/160]
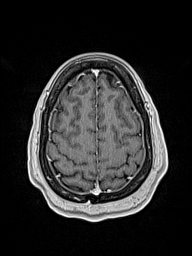
[im 142/160]
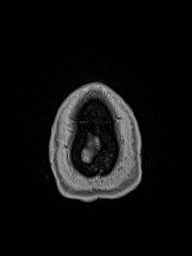
[im 160/160]
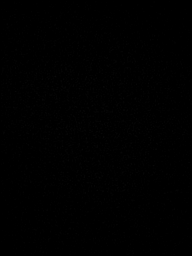

[Series 17: T1 post-contrast · coronal · 5.0mm · 0.43mm/px · 2 of 30 slices shown (2 of 3)]
[im 1/30]
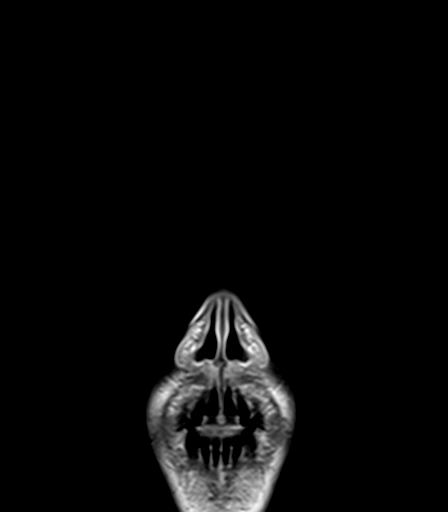
[im 30/30]
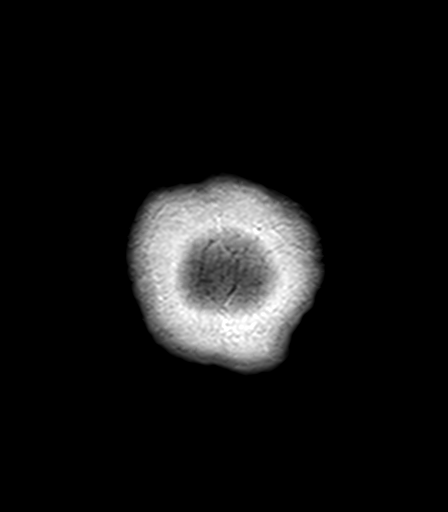

[Series 18: T1 post-contrast · sagittal · 5.0mm · 0.75mm/px · 2 of 25 slices shown (3 of 3)]
[im 1/25]
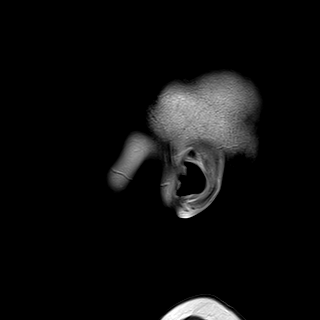
[im 25/25]
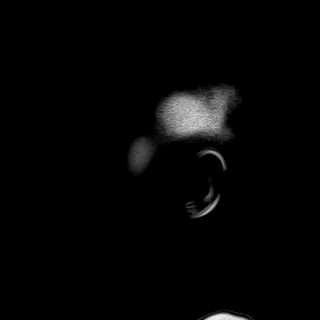

[48 of 48 positions shown; findings below may reference images not displayed]

FINDINGS: Brain: There is no evidence of an acute infarct, intracranial
hemorrhage, midline shift, or extra-axial fluid collection. The
ventricles and sulci are normal. An 8 mm enhancing extra-axial mass
along the right aspect of the falx/superior sagittal sinus in the
posterior frontal region at the vertex is unchanged, and there is no
significant associated mass effect or brain edema. A partially empty
sella is unchanged.

T2 hyperintensities in the cerebral white matter bilaterally,
greatest in the periventricular regions, and in the right midbrain
and left pons are unchanged. No enhancing lesions are identified.

Vascular: Major intracranial vascular flow voids are preserved.

Skull and upper cervical spine: Unremarkable bone marrow signal.

Sinuses/Orbits: Unremarkable orbits. Mucous retention cyst in the
right maxillary sinus. Clear paranasal sinuses.

Other: None.
IMPRESSION: 1. Unchanged cerebral white matter disease consistent with multiple
sclerosis. No evidence of active demyelination.
2. Unchanged 8 mm parafalcine meningioma at the vertex.

## 2021-10-16 IMAGING — MR MR CERVICAL SPINE WO/W CM
7 of 8 series · 32 of 48 positions shown · IV contrast (9 ML GADAVIST)
Comparison: Cervical spine MRI [DATE]

CLINICAL DATA: Multiple sclerosis.

EXAM:
MRI CERVICAL SPINE WITHOUT AND WITH CONTRAST
TECHNIQUE: Multiplanar and multiecho pulse sequences of the cervical spine, to
include the craniocervical junction and cervicothoracic junction,
were obtained without and with intravenous contrast.
CONTRAST:  9mL GADAVIST GADOBUTROL 1 MMOL/ML IV SOLN

[Series 5: T1 · sagittal · 3.0mm · 0.69mm/px · 3 of 15 slices shown (1 of 2)]
[im 1/15]
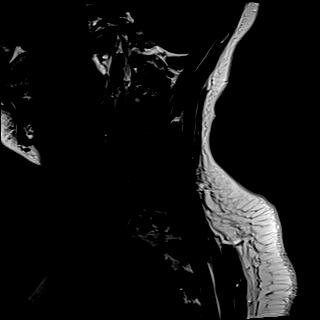
[im 8/15]
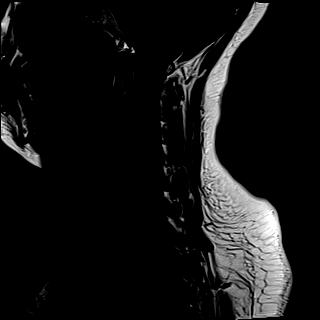
[im 15/15]
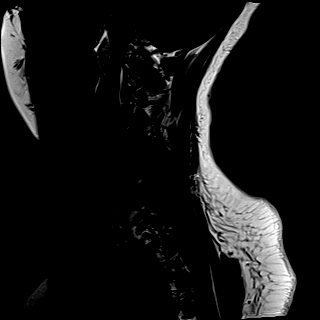

[Series 6: STIR · sagittal · 3.0mm · 0.86mm/px · 3 of 15 slices shown]
[im 1/15]
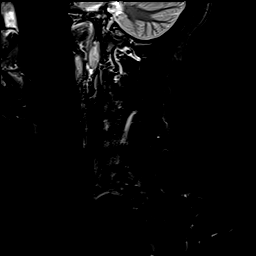
[im 8/15]
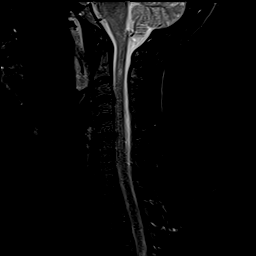
[im 15/15]
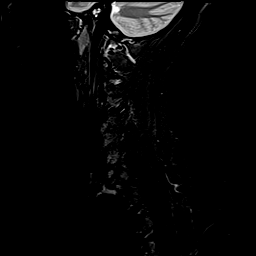

[Series 7: T2 · axial · 3.0mm · 0.70mm/px · z∈[-200,-51]mm · 9 of 44 slices shown]
[im 1/44]
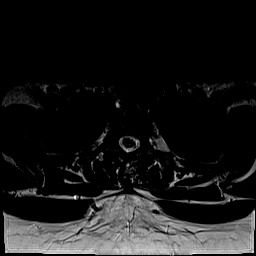
[im 6/44]
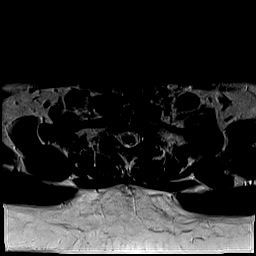
[im 11/44]
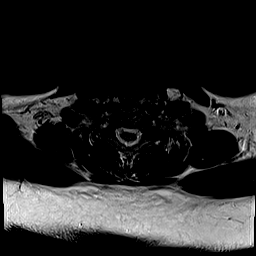
[im 17/44]
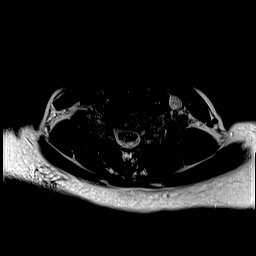
[im 22/44]
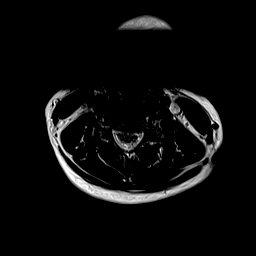
[im 27/44]
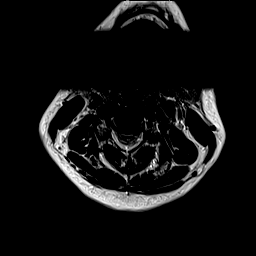
[im 33/44]
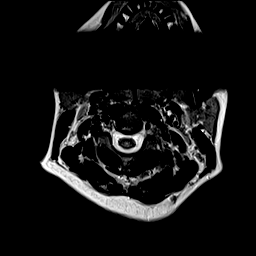
[im 38/44]
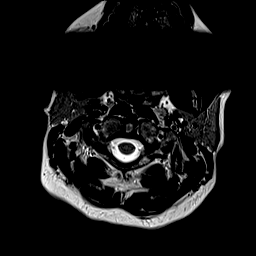
[im 44/44]
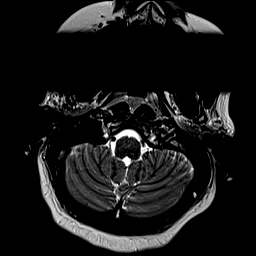

[Series 9: T1 · axial · 3.0mm · 0.35mm/px · z∈[-200,-51]mm · 9 of 44 slices shown (2 of 2)]
[im 1/44]
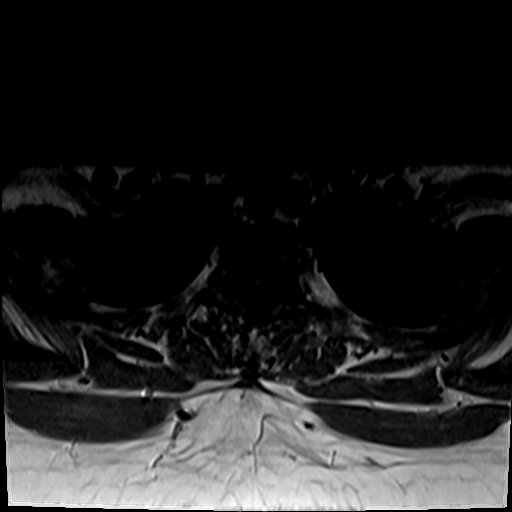
[im 6/44]
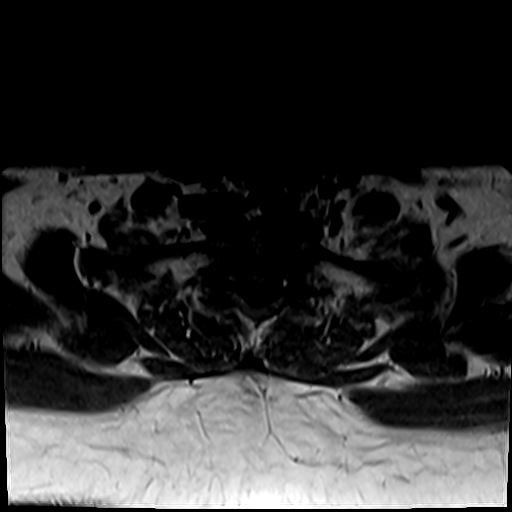
[im 11/44]
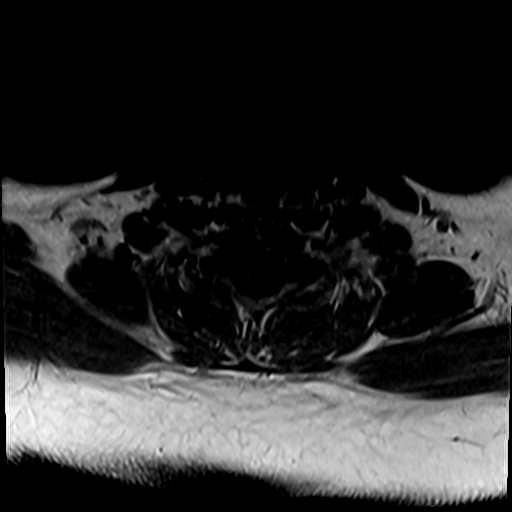
[im 17/44]
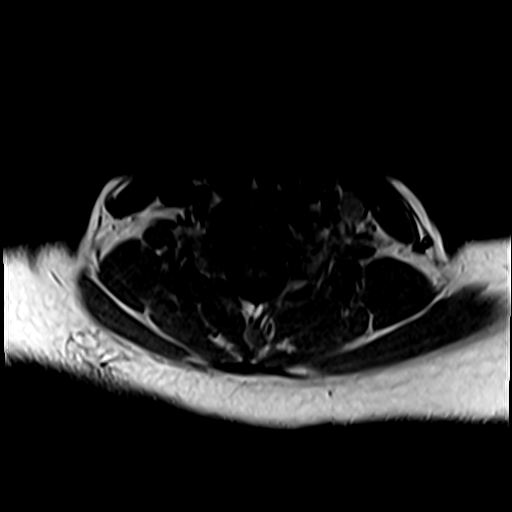
[im 22/44]
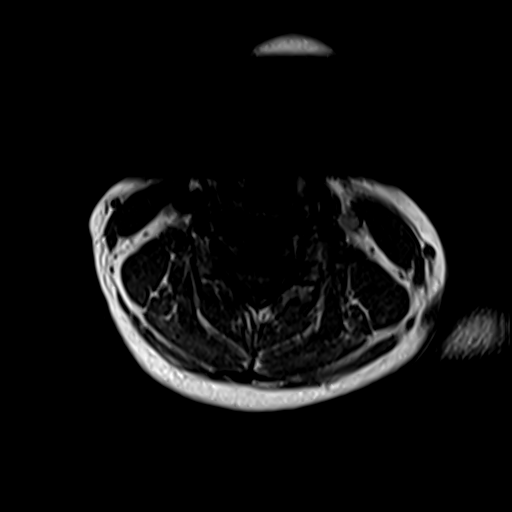
[im 27/44]
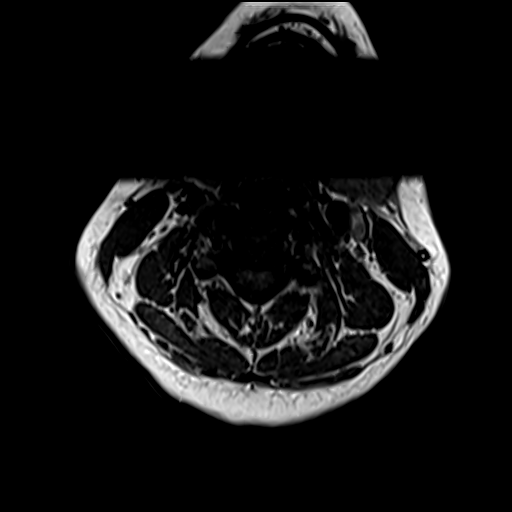
[im 33/44]
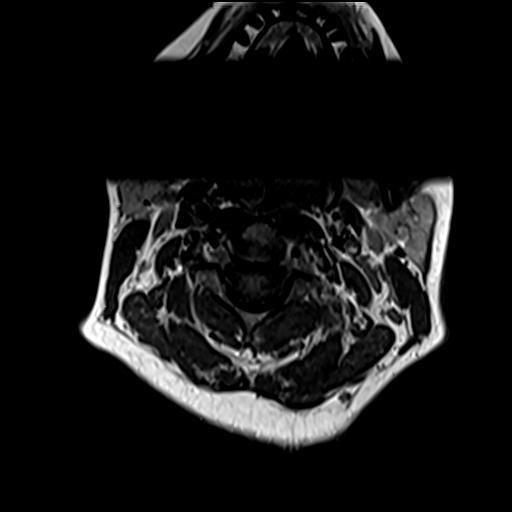
[im 38/44]
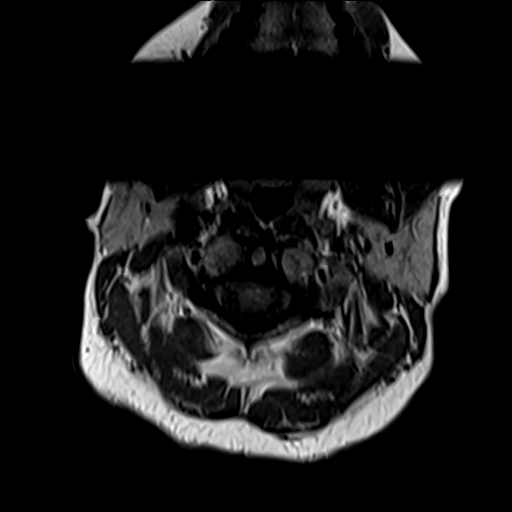
[im 44/44]
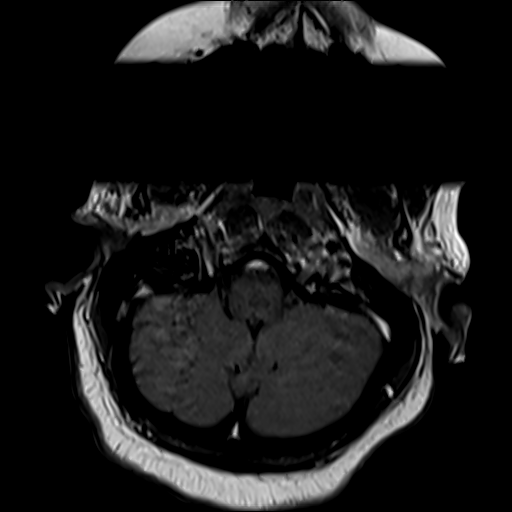

[Series 10: T2 post-contrast · sagittal · 3.0mm · 0.69mm/px · 3 of 15 slices shown]
[im 1/15]
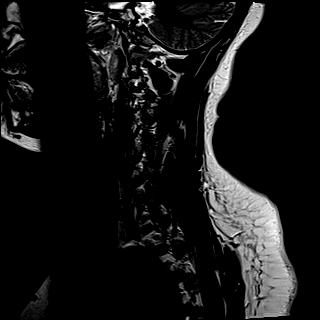
[im 8/15]
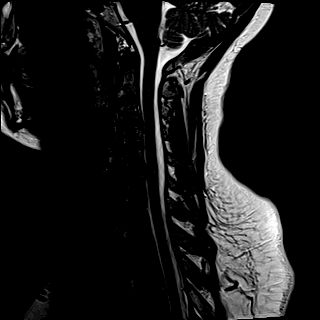
[im 15/15]
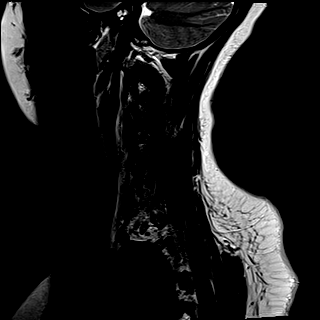

[Series 11: T1 fat-sat post-contrast · sagittal · 3.0mm · 0.69mm/px · 3 of 13 slices shown]
[im 1/13]
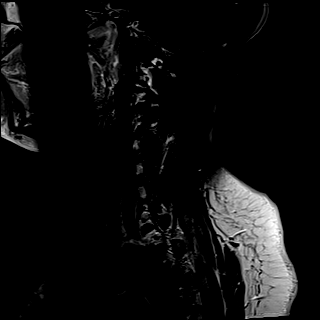
[im 7/13]
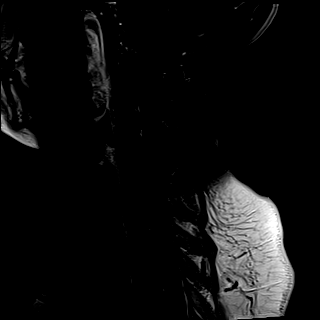
[im 13/13]
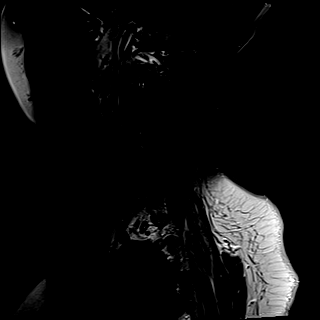

[Series 12: T1 post-contrast · axial · 3.0mm · 0.35mm/px · z∈[-200,-182]mm · 2 of 44 slices shown]
[im 1/44]
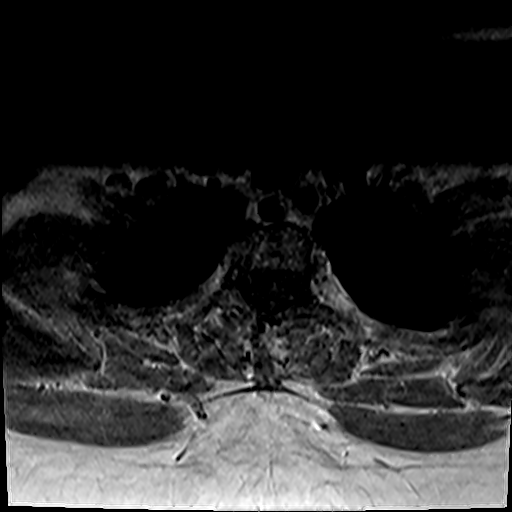
[im 6/44]
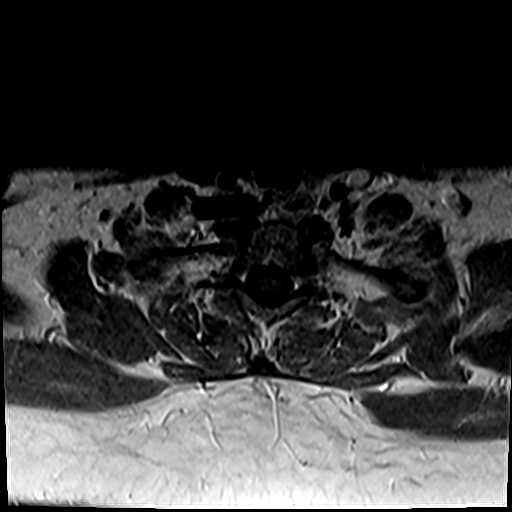

[32 of 48 positions shown; findings below may reference images not displayed]

FINDINGS: The axial sequences are moderately motion degraded.

Alignment: Chronic straightening of the normal cervical lordosis. No
listhesis.

Vertebrae: No fracture, suspicious marrow lesion, or significant
marrow edema.

Cord: Nonenhancing T2 hyperintense lesions are again seen in the
spinal cord at C2 and C5-6. No definite new or enhancing lesions are
identified within limitations of motion artifact.

Posterior Fossa, vertebral arteries, paraspinal tissues: Posterior
fossa reported on today's separate head MRI. Preserved vertebral
artery flow voids.

Disc levels:

Similar appearance of mild cervical spondylosis compared to the
prior MRI, most notable at C5-6 where a small left-sided disc
protrusion results in mild left neural foraminal stenosis. No
significant spinal stenosis.
IMPRESSION: 1. Motion degraded examination.
2. Unchanged chronic demyelinating lesions in the spinal cord at C2
and C5-6. No definite new or enhancing lesions to suggest active
demyelination.
3. Unchanged mild cervical spondylosis.

## 2021-10-16 IMAGING — CT CT ANGIO CHEST
2 of 6 series · 18 of 37 positions shown · IV contrast (OMNIPAQUE 350)
Comparison: PET-CT [DATE]

CLINICAL DATA: Pulmonary embolism suspected.  Lung mass

EXAM:
CT ANGIOGRAPHY CHEST WITH CONTRAST
TECHNIQUE: Multidetector CT imaging of the chest was performed using the
standard protocol during bolus administration of intravenous
contrast. Multiplanar CT image reconstructions and MIPs were
obtained to evaluate the vascular anatomy.

[Series 6: thins · axial · 0.76mm/px · z∈[-348,-76]mm · 15 of 307 slices shown]
[im 18/307  lung]
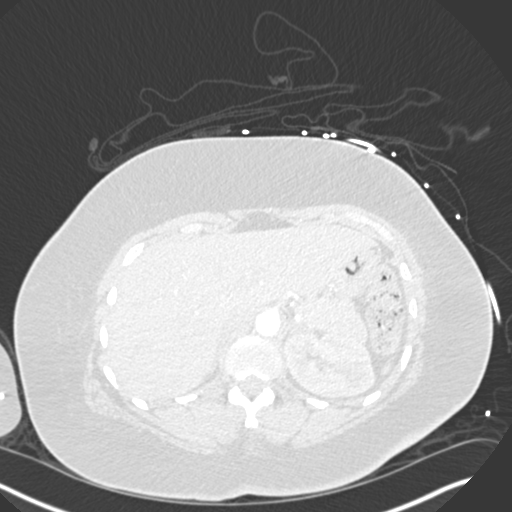
[im 35/307  mediastinal]
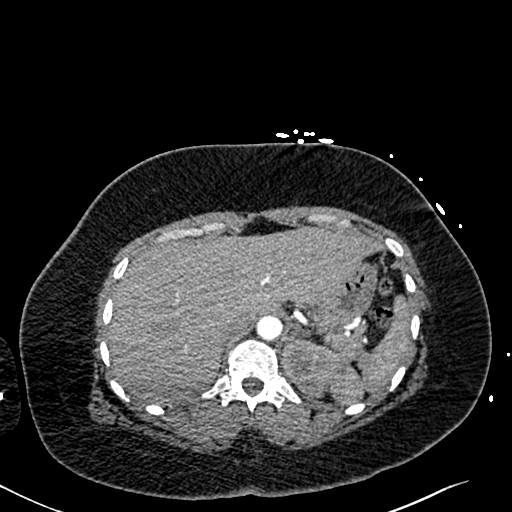
[im 52/307  lung]
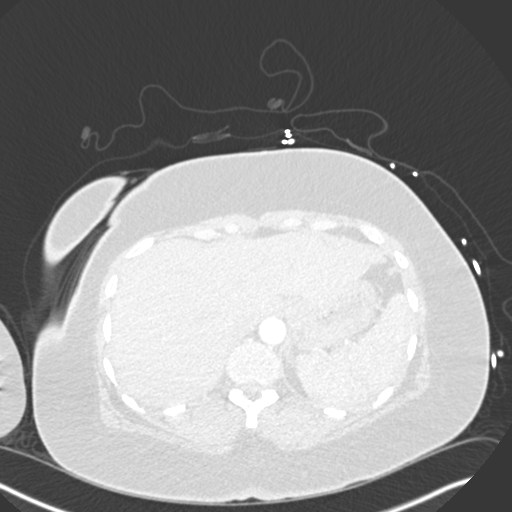
[im 69/307  mediastinal]
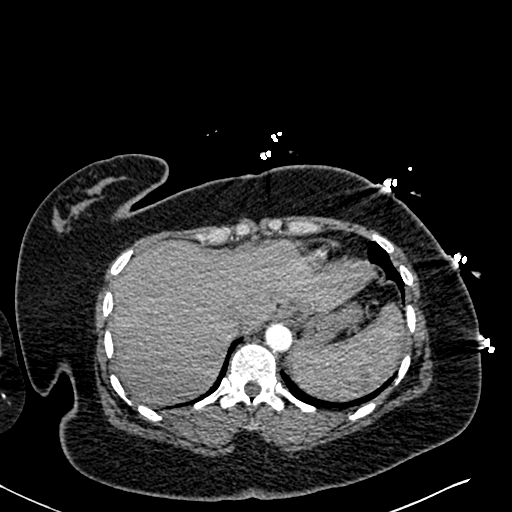
[im 103/307  lung]
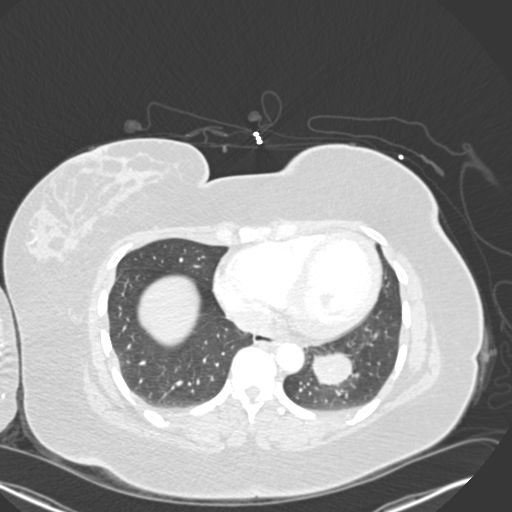
[im 120/307  mediastinal]
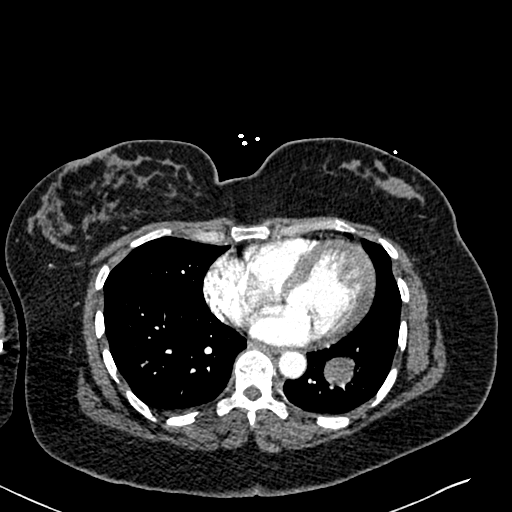
[im 137/307  lung]
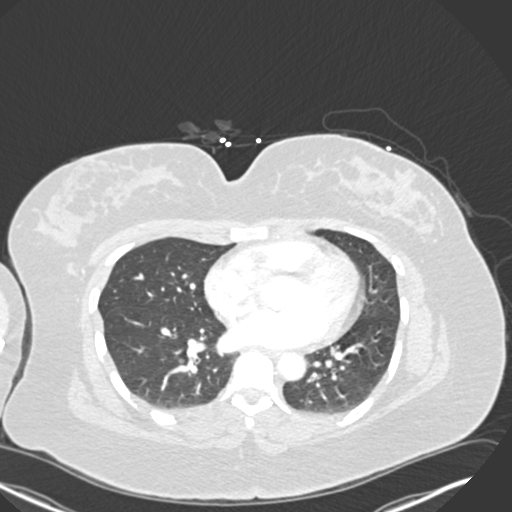
[im 154/307  mediastinal]
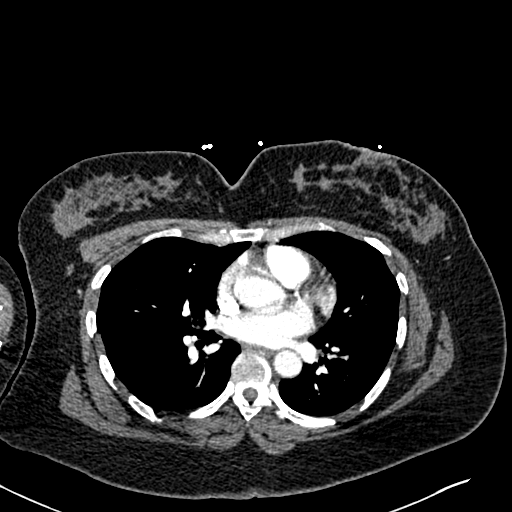
[im 171/307  lung]
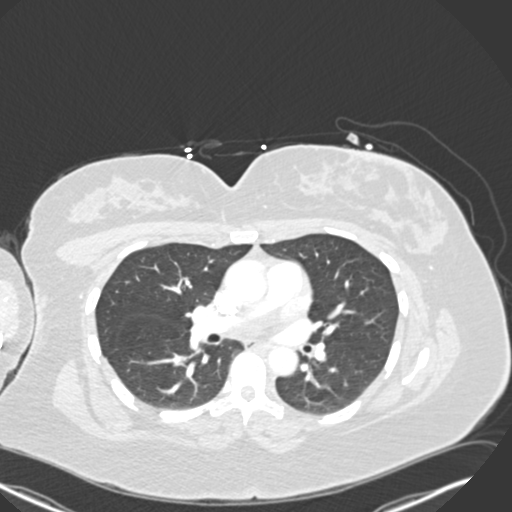
[im 188/307  mediastinal]
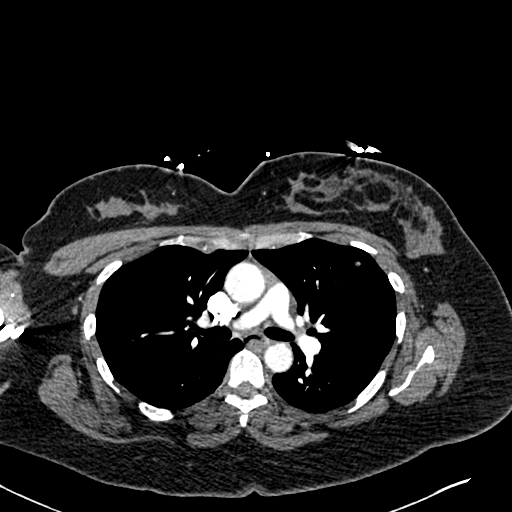
[im 205/307  lung]
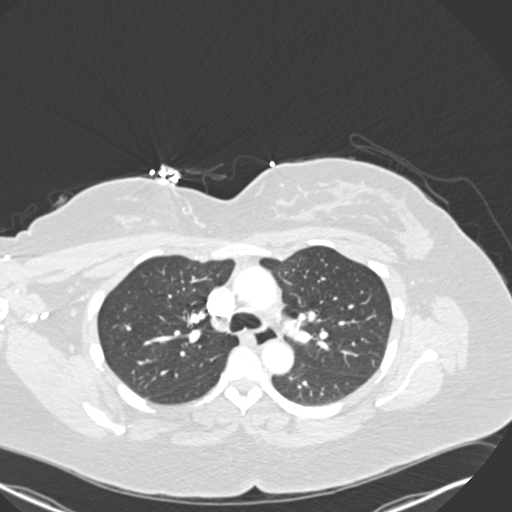
[im 239/307  mediastinal]
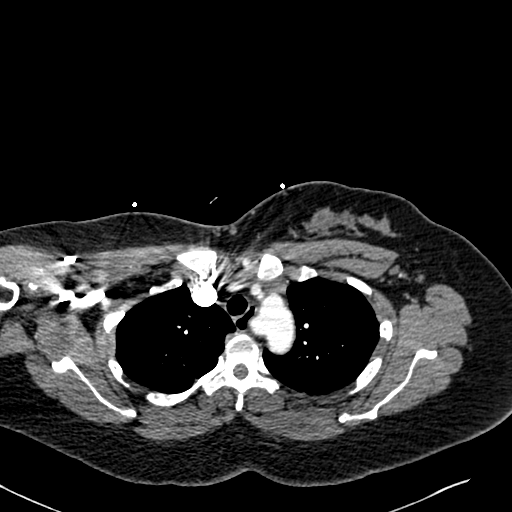
[im 256/307  lung]
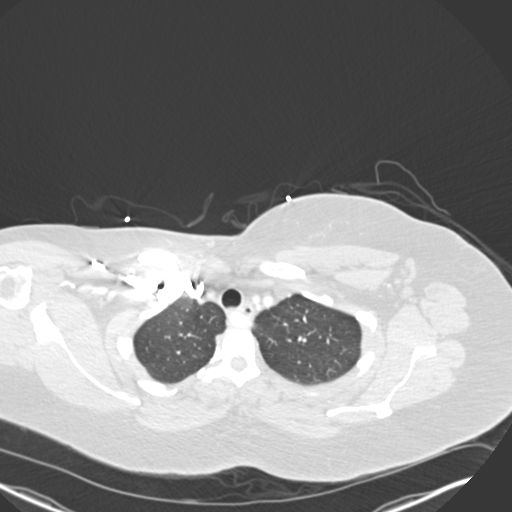
[im 273/307  mediastinal]
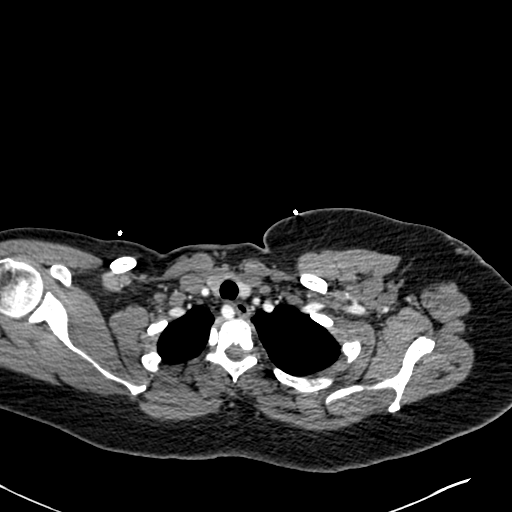
[im 290/307  lung]
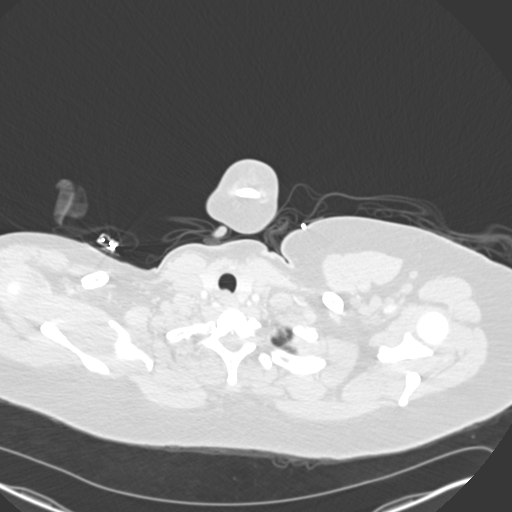

[Series 8: coronal mpr · coronal · 0.63mm/px · 3 of 143 slices shown]
[im 29/143  mediastinal]
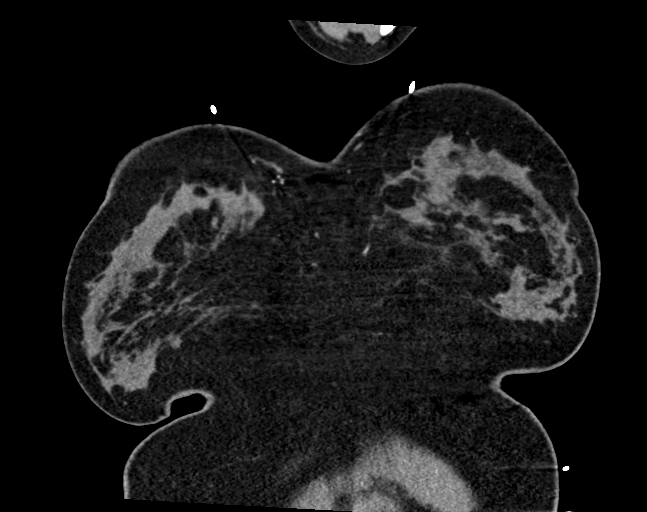
[im 57/143  mediastinal]
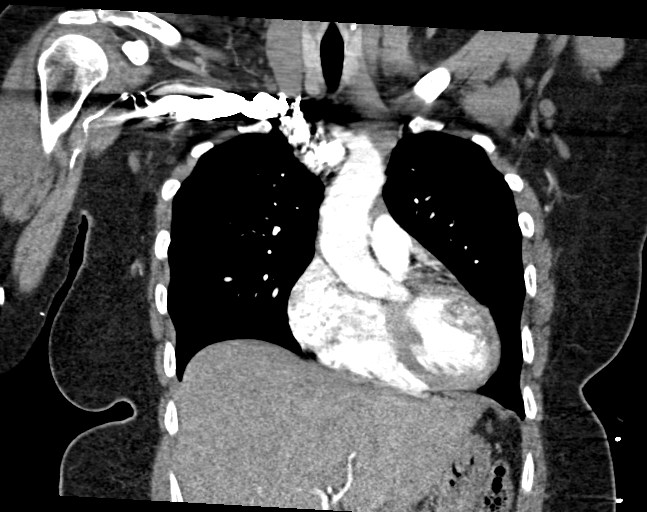
[im 86/143  mediastinal]
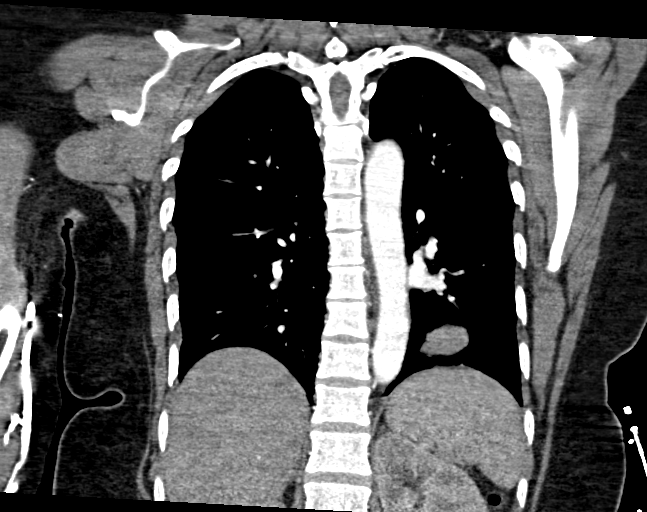

[18 of 37 positions shown; findings below may reference images not displayed]

RADIATION DOSE REDUCTION: This exam was performed according to the
departmental dose-optimization program which includes automated
exposure control, adjustment of the mA and/or kV according to
patient size and/or use of iterative reconstruction technique.

CONTRAST:  75mL OMNIPAQUE IOHEXOL 350 MG/ML SOLN
FINDINGS: Cardiovascular: Satisfactory opacification of the pulmonary arteries
to the segmental level. No evidence of pulmonary embolism. Normal
heart size. No pericardial effusion. Aberrant right subclavian
artery

Mediastinum/Nodes: Negative for mass or adenopathy.

Lungs/Pleura: Pulmonary nodules in the left upper lobe (in 3
locations) and in 2 locations in the left lower lobe. The largest is
a mass in the left lower lobe measuring 3.4 cm, previously 2.8 cm by
PET CT comparison. Milder nodularity in the right lung including a
4-5 mm nodule in the right lower lobe on [DATE].

Upper Abdomen: No acute finding

Musculoskeletal: No acute or aggressive finding.

Review of the MIP images confirms the above findings.
IMPRESSION: 1. Negative for pulmonary embolism or other acute finding.
2. Known pulmonary nodules evaluated by PET CT in [0D]. Dominant
mass in the left lower lobe measuring 3.4 cm, mildly increased in
size from PET CT [DATE].

## 2021-10-16 MED ORDER — PROCHLORPERAZINE EDISYLATE 10 MG/2ML IJ SOLN
10.0000 mg | Freq: Once | INTRAMUSCULAR | Status: AC
  Administered 2021-10-16: 10 mg via INTRAVENOUS
  Filled 2021-10-16: qty 2

## 2021-10-16 MED ORDER — IOHEXOL 350 MG/ML SOLN
75.0000 mL | Freq: Once | INTRAVENOUS | Status: AC | PRN
Start: 2021-10-16 — End: 2021-10-16
  Administered 2021-10-16: 75 mL via INTRAVENOUS

## 2021-10-16 MED ORDER — PROCHLORPERAZINE MALEATE 10 MG PO TABS
10.0000 mg | ORAL_TABLET | Freq: Two times a day (BID) | ORAL | 0 refills | Status: DC | PRN
Start: 2021-10-16 — End: 2021-12-07

## 2021-10-16 MED ORDER — GADOBUTROL 1 MMOL/ML IV SOLN
9.0000 mL | Freq: Once | INTRAVENOUS | Status: AC | PRN
  Administered 2021-10-16: 9 mL via INTRAVENOUS

## 2021-10-16 MED ORDER — SODIUM CHLORIDE 0.9 % IV BOLUS
1000.0000 mL | Freq: Once | INTRAVENOUS | Status: AC
  Administered 2021-10-16: 1000 mL via INTRAVENOUS

## 2021-10-16 MED ORDER — SODIUM CHLORIDE (PF) 0.9 % IJ SOLN
INTRAMUSCULAR | Status: AC
  Filled 2021-10-16: qty 50

## 2021-10-16 MED ORDER — DIPHENHYDRAMINE HCL 50 MG/ML IJ SOLN
50.0000 mg | Freq: Once | INTRAMUSCULAR | Status: AC
  Administered 2021-10-16: 50 mg via INTRAVENOUS
  Filled 2021-10-16: qty 1

## 2021-10-16 NOTE — ED Notes (Signed)
Patient in scans. Will update vitals when patient returns.

## 2021-10-16 NOTE — ED Notes (Signed)
An After Visit Summary was printed and given to the patient. Discharge instructions given and no further questions at this time.  

## 2021-10-16 NOTE — ED Provider Notes (Signed)
[facility] DEPT Provider Note   CSN: [id number] Arrival date & time: 10/16/21  8182     History  Chief Complaint  Patient presents with   Headache   Arm Pain    [name] is a 46 y.o. female.  The history is provided by the patient and medical records. No language interpreter was used.  Headache Pain location:  Generalized Quality:  Dull Radiates to:  Does not radiate Severity currently:  8/10 Severity at highest:  8/10 Onset quality:  Gradual Duration:  2 weeks Timing:  Constant Chronicity:  Recurrent Associated symptoms: back pain, cough, fatigue, neck pain and numbness   Associated symptoms: no abdominal pain, no congestion, no diarrhea, no dizziness, no fever, no nausea, no neck stiffness, no vomiting and no weakness   Neurologic Problem This is a recurrent problem. The current episode started more than 1 week ago. The problem occurs constantly. The problem has been gradually worsening. Associated symptoms include chest pain, headaches and shortness of breath. Pertinent negatives include no abdominal pain. Nothing aggravates the symptoms. Nothing relieves the symptoms. She has tried nothing for the symptoms. The treatment provided no relief.       Home Medications Prior to Admission medications   Medication Sig Start Date End Date Taking? Authorizing Provider  amoxicillin-clavulanate (AUGMENTIN) 875-125 MG tablet Take 1 tablet by mouth every 12 (twelve) hours. Patient not taking: Reported on 10/02/2021 07/20/21   Prosperi, Christian H, PA-C  Ascorbic Acid (VITAMIN C PO) Take 2 tablets by mouth daily.    [provider]  Calcium Carbonate Antacid (TUMS PO) Take 2-6 tablets by mouth See admin instructions. Take 2-6 tablets by mouth 1-3 times a day as needed acid reflux    [provider]  cholecalciferol (VITAMIN D3) 25 MCG (1000 UNIT) tablet Take 1 tablet (1,000 Units total) by mouth daily. To start after weekly  Ergocalciferol is completed Patient not taking: Reported on 10/02/2021 05/18/20   Domenic Polite, MD  Cyanocobalamin (VITAMIN B-12 PO) Take 2 capsules by mouth daily.    [provider]  ELDERBERRY PO Take 10 mLs by mouth daily. Elderberry syrup    [provider]  Fexofenadine-Pseudoephedrine (ALLEGRA-D PO) Take 1 tablet by mouth daily.    [provider]  Glatiramer Acetate 40 MG/ML SOSY INJECT ONE SYRINGE SUBCUTANEOUSLY 3 TIMES A WEEK AT LEAST 48 HOURS APART. ALLOW TO WARM TO ROOM TEMP FOR 20 MINUTES. REFRIGERATE. Patient taking differently: Inject 40 mg into the skin See admin instructions. Inject 40 mg subcutaneously 3 times a week. 03/30/21   Sater, Nanine Means, MD  meloxicam (MOBIC) 7.5 MG tablet Take 1 tablet (7.5 mg total) by mouth daily. Patient taking differently: Take 7.5 mg by mouth daily as needed (migraines). 05/19/21   Lomax, Amy, NP  OVER THE COUNTER MEDICATION Take 10 mLs by mouth daily. Seamoss gel    [provider]  SUMAtriptan (IMITREX) 100 MG tablet Take 1 tablet (100 mg total) by mouth once as needed for up to 1 dose for migraine. May repeat in 2 hours if headache persists or recurs. 05/19/21   Lomax, Amy, NP  Vitamin D, Ergocalciferol, (DRISDOL) 1.25 MG (50000 UNIT) CAPS capsule TAKE 1 CAPSULE (50,000 UNITS TOTAL) BY MOUTH EVERY 7 (SEVEN) DAYS Patient not taking: Reported on 10/02/2021 11/19/20   Sater, Nanine Means, MD  zonisamide (ZONEGRAN) 100 MG capsule Take 100 mg by mouth at bedtime. Patient not taking: Reported on 10/02/2021 05/20/21   [provider]  Allergies    Pork-derived products and Latex    Review of Systems   Review of Systems  Constitutional:  Positive for fatigue. Negative for chills, diaphoresis and fever.  HENT:  Negative for congestion.   Eyes:  Positive for visual disturbance.  Respiratory:  Positive for cough, chest tightness and shortness of breath. Negative for wheezing.   Cardiovascular:  Positive for  chest pain.  Gastrointestinal:  Negative for abdominal pain, constipation, diarrhea, nausea and vomiting.  Genitourinary:  Negative for dysuria and frequency.  Musculoskeletal:  Positive for back pain and neck pain. Negative for neck stiffness.  Skin:  Negative for rash and wound.  Neurological:  Positive for numbness and headaches. Negative for dizziness, weakness and light-headedness.  Psychiatric/Behavioral:  Negative for agitation and confusion.   All other systems reviewed and are negative.   Physical Exam Updated Vital Signs BP (!) 146/98 (BP Location: Right Arm)   Pulse 88   Resp 18   Ht '5\' 7"'$  (1.702 m)   Wt 90.7 kg   SpO2 99%   BMI 31.32 kg/m  Physical Exam Vitals and nursing note reviewed.  Constitutional:      General: She is not in acute distress.    Appearance: She is well-developed. She is not ill-appearing, toxic-appearing or diaphoretic.  HENT:     Head: Normocephalic and atraumatic.  Eyes:     Extraocular Movements: Extraocular movements intact.     Right eye: Normal extraocular motion.     Left eye: Normal extraocular motion.     Conjunctiva/sclera: Conjunctivae normal.     Pupils: Pupils are equal, round, and reactive to light.  Cardiovascular:     Rate and Rhythm: Normal rate and regular rhythm.     Heart sounds: No murmur heard. Pulmonary:     Effort: Pulmonary effort is normal. No respiratory distress.     Breath sounds: Normal breath sounds. No wheezing, rhonchi or rales.  Chest:     Chest wall: No tenderness.  Abdominal:     Palpations: Abdomen is soft.     Tenderness: There is no abdominal tenderness.  Musculoskeletal:        General: No swelling.     Cervical back: Neck supple.  Skin:    General: Skin is warm and dry.     Capillary Refill: Capillary refill takes less than 2 seconds.  Neurological:     Mental Status: She is alert.     Cranial Nerves: No cranial nerve deficit, dysarthria or facial asymmetry.     Sensory: Sensory deficit  present.     Motor: No abnormal muscle tone or seizure activity.     Coordination: Finger-Nose-Finger Test normal.     Comments: Patient subjectively reports her sensation in her arms and legs is not what it normally is but it is symmetric at this time.  Psychiatric:        Mood and Affect: Mood normal.     ED Results / Procedures / Treatments   Labs (all labs ordered are listed, but only abnormal results are displayed) Labs Reviewed  CBC WITH DIFFERENTIAL/PLATELET - Abnormal; Notable for the following components:      Result Value   Hemoglobin 11.2 (*)    MCV 74.1 (*)    MCH 22.0 (*)    MCHC 29.6 (*)    All other components within normal limits  COMPREHENSIVE METABOLIC PANEL - Abnormal; Notable for the following components:   Glucose, Bld 103 (*)    Albumin 3.4 (*)  Anion gap 4 (*)    All other components within normal limits  TROPONIN I (HIGH SENSITIVITY)  TROPONIN I (HIGH SENSITIVITY)    EKG EKG Interpretation  Date/Time:  Saturday October 16 2021 07:45:00 EDT Ventricular Rate:  71 PR Interval:  177 QRS Duration: 82 QT Interval:  394 QTC Calculation: 429 R Axis:   73 Text Interpretation: Sinus rhythm ST elev, probable normal early repol pattern when compared to prior, similar appearance. No STEMI Confirmed by Antony Blackbird [phone]) on 10/16/2021 7:51:00 AM  Radiology MR THORACIC SPINE W WO CONTRAST  Result Date: 10/16/2021 CLINICAL DATA:  Multiple sclerosis. EXAM: MRI THORACIC WITHOUT AND WITH CONTRAST TECHNIQUE: Multiplanar and multiecho pulse sequences of the thoracic spine were obtained without and with intravenous contrast. CONTRAST:  22m GADAVIST GADOBUTROL 1 MMOL/ML IV SOLN COMPARISON:  Chest CTA 10/16/2021. FINDINGS: Alignment:  Normal. Vertebrae: No fracture, suspicious marrow lesion, or significant marrow edema. Cord: Small T2 hyperintense lesions in the spinal cord at T4 and T9-10. No associated enhancement. Normal spinal cord caliber. Paraspinal and other soft  tissues: Trace pleural fluid bilaterally. 3.4 cm left lower lobe lung mass, more fully evaluated on today's chest CTA. Disc levels: Preserved disc space heights throughout. No disc herniation or stenosis. IMPRESSION: 1. Small lesions in the spinal cord at T4 and T9-10 consistent with multiple sclerosis. No evidence of active demyelination. 2. Known left lower lobe pulmonary mass. Electronically Signed   By: ALogan BoresM.D.   On: 10/16/2021 14:30   MR Cervical Spine W or Wo Contrast  Result Date: 10/16/2021 CLINICAL DATA:  Multiple sclerosis. EXAM: MRI CERVICAL SPINE WITHOUT AND WITH CONTRAST TECHNIQUE: Multiplanar and multiecho pulse sequences of the cervical spine, to include the craniocervical junction and cervicothoracic junction, were obtained without and with intravenous contrast. CONTRAST:  9720mGADAVIST GADOBUTROL 1 MMOL/ML IV SOLN COMPARISON:  Cervical spine MRI 06/01/2021 FINDINGS: The axial sequences are moderately motion degraded. Alignment: Chronic straightening of the normal cervical lordosis. No listhesis. Vertebrae: No fracture, suspicious marrow lesion, or significant marrow edema. Cord: Nonenhancing T2 hyperintense lesions are again seen in the spinal cord at C2 and C5-6. No definite new or enhancing lesions are identified within limitations of motion artifact. Posterior Fossa, vertebral arteries, paraspinal tissues: Posterior fossa reported on today's separate head MRI. Preserved vertebral artery flow voids. Disc levels: Similar appearance of mild cervical spondylosis compared to the prior MRI, most notable at C5-6 where a small left-sided disc protrusion results in mild left neural foraminal stenosis. No significant spinal stenosis. IMPRESSION: 1. Motion degraded examination. 2. Unchanged chronic demyelinating lesions in the spinal cord at C2 and C5-6. No definite new or enhancing lesions to suggest active demyelination. 3. Unchanged mild cervical spondylosis. Electronically Signed   By:  AlLogan Bores.D.   On: 10/16/2021 14:23   MR Brain W and Wo Contrast  Result Date: 10/16/2021 CLINICAL DATA:  Multiple sclerosis. EXAM: MRI HEAD WITHOUT AND WITH CONTRAST TECHNIQUE: Multiplanar, multiecho pulse sequences of the brain and surrounding structures were obtained without and with intravenous contrast. CONTRAST:  20m53mADAVIST GADOBUTROL 1 MMOL/ML IV SOLN COMPARISON:  Head MRI 06/01/2021 FINDINGS: Brain: There is no evidence of an acute infarct, intracranial hemorrhage, midline shift, or extra-axial fluid collection. The ventricles and sulci are normal. An 8 mm enhancing extra-axial mass along the right aspect of the falx/superior sagittal sinus in the posterior frontal region at the vertex is unchanged, and there is no significant associated mass effect or brain edema. A partially empty sella  is unchanged. T2 hyperintensities in the cerebral white matter bilaterally, greatest in the periventricular regions, and in the right midbrain and left pons are unchanged. No enhancing lesions are identified. Vascular: Major intracranial vascular flow voids are preserved. Skull and upper cervical spine: Unremarkable bone marrow signal. Sinuses/Orbits: Unremarkable orbits. Mucous retention cyst in the right maxillary sinus. Clear paranasal sinuses. Other: None. IMPRESSION: 1. Unchanged cerebral white matter disease consistent with multiple sclerosis. No evidence of active demyelination. 2. 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 Electronically Signed   By: Logan Bores M.D.   On: 10/16/2021 14:14   CT Angio Chest PE W and/or Wo Contrast  Result Date: 10/16/2021 CLINICAL DATA:  Pulmonary embolism suspected.  Lung mass EXAM: CT ANGIOGRAPHY CHEST WITH CONTRAST TECHNIQUE: Multidetector CT imaging of the chest was performed using the standard protocol during bolus administration of intravenous contrast. Multiplanar CT image reconstructions and MIPs were obtained to evaluate the vascular anatomy.  RADIATION DOSE REDUCTION: This exam was performed according to the departmental dose-optimization program which includes automated exposure control, adjustment of the mA and/or kV according to patient size and/or use of iterative reconstruction technique. CONTRAST:  76m OMNIPAQUE IOHEXOL 350 MG/ML SOLN COMPARISON:  PET-CT 03/10/2021 FINDINGS: Cardiovascular: Satisfactory opacification of the pulmonary arteries to the segmental level. No evidence of pulmonary embolism. Normal heart size. No pericardial effusion. Aberrant right subclavian artery Mediastinum/Nodes: Negative for mass or adenopathy. Lungs/Pleura: Pulmonary nodules in the left upper lobe (in 3 locations) and in 2 locations in the left lower lobe. The largest is a mass in the left lower lobe measuring 3.4 cm, previously 2.8 cm by PET CT comparison. Milder nodularity in the right lung including a 4-5 mm nodule in the right lower lobe on 7:82. Upper Abdomen: No acute finding Musculoskeletal: No acute or aggressive finding. Review of the MIP images confirms the above findings. IMPRESSION: 1. Negative for pulmonary embolism or other acute finding. 2. Known pulmonary nodules evaluated by PET CT in 2022. Dominant mass in the left lower lobe measuring 3.4 cm, mildly increased in size from PET CT 2022. Electronically Signed   By: JJorje GuildM.D.   On: 10/16/2021 10:15    Procedures Procedures    Medications Ordered in ED Medications  prochlorperazine (COMPAZINE) injection 10 mg (10 mg Intravenous Given 10/16/21 0757)  diphenhydrAMINE (BENADRYL) injection 50 mg (50 mg Intravenous Given 10/16/21 0756)  sodium chloride 0.9 % bolus 1,000 mL (1,000 mLs Intravenous New Bag/Given 10/16/21 0757)  sodium chloride (PF) 0.9 % injection (  Given by Other 10/16/21 1216)  iohexol (OMNIPAQUE) 350 MG/ML injection 75 mL (75 mLs Intravenous Contrast Given 10/16/21 0933)  gadobutrol (GADAVIST) 1 MMOL/ML injection 9 mL (9 mLs Intravenous Contrast Given 10/16/21 1332)     ED Course/ Medical Decision Making/ A&P                           Medical Decision Making Amount and/or Complexity of Data Reviewed Labs: ordered. Radiology: ordered.  Risk Prescription drug management.    Crystal REITANOis a 46y.o. female with a past medical history significant for MS, anxiety, depression, previous hysterectomy for fibroids, and known lung mass who presents with several weeks of worsening pain in her head, neck, back, with associated numbness and tingling of arms and legs as well as waxing and waning blurry vision and waxing and waning chest pain and shortness of breath.  According to patient, there has been a  problem with her insurance approval so she has not been on her multiple sclerosis medications for the last month and this is when the symptoms have developed.  She reports that they have been worsening and she is having severe headaches, pain in her neck, and pain in her back.  She reports this feels like when she has had MS flares in the past and she is having waxing and waning numbness in her arms and legs bilaterally.  She is also having blurry vision that comes and goes.  She is also concerned because she has a growing mass on x-ray that she is starting to have worked up with pulmonology.  She reports that for the last week or 2 she has had continued and worsening exertional pain in her left upper chest and says she feels like there is pressure and she cannot take a deep breath at times.  She does not have any history of DVT or PE in the chart.  She denies new trauma.  She denies nausea, vomiting, constipation, diarrhea, or urinary changes.  Denies loss of bowel or bladder control.  Patient does report a mild dry cough but no production reported.  On exam, lungs were clear and chest was nontender.  Abdomen was nontender.  Patient had intact strength in all extremities but reports subjectively that her sensation felt different in both her arms and legs compared to  her normal self.  She did not have any sensation abnormalities of the face.  Pupils are symmetric and reactive with normal extraocular movements.  Clear speech.  She did have some tenderness in her neck and mid to low back.  No rashes seen.  Given her report that she is having neurologic complaints with pain in her head and neck and back with her history of MS I am concerned about MS flare.  I spoke briefly to neurology who recommend getting MRI with and without of the brain, C-spine, and T-spine to evaluate.  We will also give her headache cocktail for the discomfort.  We will get screening labs.  We we will also get a CT PE study given the growing mass in her chest and her report that it feels like a pressure that she cannot take a deep breath to rule out pulmonary embolism or other etiology of her symptoms.  We will get troponins and watch her on the cardiac monitor.  EKG did not show STEMI.  Anticipate reassessment after work-up to determine disposition.  2:28 PM CT scan returned showing slight increase in the size of the lung mass.  No pneumonia or pneumothorax or other acute abnormality seen.  MRI of the brain and C-spine returned showing old MS lesions but no evidence of acute demyelination.  Awaiting results of thoracic MRI and if they are reassuring, dissipate discharge home to follow-up with outpatient team.  Thoracic spine MRI also does not show evidence of acute demyelination.  We reassessed the patient and her symptoms have completely resolved.  No further headache.  She would like to go home.  We will give her prescription for Compazine as she reports this worked very well with her headache and nausea and she will follow-up with her pulmonology team and neurology teams.  She agreed with plan of care no other questions or concerns.  Patient discharged in good condition with resolved symptoms.         Final Clinical Impression(s) / ED Diagnoses Final diagnoses:  Bad headache   Tingling in extremities  Lung mass  Chest pain, unspecified type    Rx / DC Orders ED Discharge Orders          Ordered    prochlorperazine (COMPAZINE) 10 MG tablet  2 times daily PRN        10/16/21 1440            Clinical Impression: 1. Bad headache   2. Tingling in extremities   3. Lung mass   4. Chest pain, unspecified type     Disposition: Discharge  Condition: Good  I have discussed the results, Dx and Tx plan with the pt(& family if present). He/she/they expressed understanding and agree(s) with the plan. Discharge instructions discussed at great length. Strict return precautions discussed and pt &/or family have verbalized understanding of the instructions. No further questions at time of discharge.    New Prescriptions   PROCHLORPERAZINE (COMPAZINE) 10 MG TABLET    Take 1 tablet (10 mg total) by mouth 2 (two) times daily as needed for nausea or vomiting.    Follow Up: Jola Baptist, PA-C [place] [address] [place] [address] [phone]     Follow-up with your pulmonology and neurology teams for further management.     [facility] [place] [id number] mc [place]        Emmet Messer, Gwenyth Allegra, MD 10/16/21 [phone]

## 2021-10-16 NOTE — Discharge Instructions (Signed)
Your history, exam, work-up today led [place] to obtaining MRIs of your head, neck, and back which did not show any evidence of acute demyelination or active MS flares.  Your CT scan did not show blood clot or pneumonia but did show that you still have the mass in your lung.  Please follow-up with pulmonology for this and follow-up with neurology for your MS management.  We gave you the headache medicines which resolved your symptoms.  We feel you are safe for discharge home however if any symptoms change or worsen acutely, please return to the nearest Emergency Department.

## 2021-10-16 NOTE — ED Triage Notes (Signed)
  Patient comes in with headache and bilateral arm pain/numbness.  Patient states she has MS and recently had a change of insurance and was unable to get her MS medication.  Patient states she has been without her medication for about a month and started getting symptoms again.  Patient states bilateral arms will get tingly intermittently and has a hard time standing after long periods of time.  Pain 8/10, constant/achy pain.

## 2021-10-18 ENCOUNTER — Other Ambulatory Visit: Payer: Self-pay | Admitting: *Deleted

## 2021-10-18 DIAGNOSIS — G35 Multiple sclerosis: Secondary | ICD-10-CM

## 2021-10-18 MED ORDER — COPAXONE 40 MG/ML ~~LOC~~ SOSY: 40.0000 mg | PREFILLED_SYRINGE | SUBCUTANEOUS | 3 refills | Status: DC

## 2021-10-18 NOTE — Telephone Encounter (Signed)
Called pt because I have not received signed Copaxone start form back yet. She sent back to fax machine, not email I had listed. I resent and she will email back to me.

## 2021-10-18 NOTE — Telephone Encounter (Signed)
Received signed Copaxone start form back from pt. Faxed completed/signed Copaxone start form to [employer] at [phone]. Received fax confirmation.

## 2021-10-18 NOTE — Addendum Note (Signed)
Addended by: Wyvonnia Lora on: 10/18/2021 11:52 AM   Modules accepted: Orders

## 2021-10-18 NOTE — Telephone Encounter (Signed)
Found out [facility] no longer exists. Can send rx Brand Copaxone to specialty pharmacy. I e-scribed to [facility].   Submitted PA on CMM. [id number]. Waiting on determination from [place].   If pt needs assistance, they can go to [email or web address].

## 2021-11-25 ENCOUNTER — Ambulatory Visit: Payer: BC Managed Care – PPO | Admitting: Neurology

## 2021-12-01 DIAGNOSIS — R911 Solitary pulmonary nodule: Secondary | ICD-10-CM | POA: Diagnosis not present

## 2021-12-01 DIAGNOSIS — R590 Localized enlarged lymph nodes: Secondary | ICD-10-CM | POA: Diagnosis not present

## 2021-12-01 DIAGNOSIS — R918 Other nonspecific abnormal finding of lung field: Secondary | ICD-10-CM | POA: Diagnosis not present

## 2021-12-07 ENCOUNTER — Encounter: Payer: Self-pay | Admitting: Neurology

## 2021-12-07 ENCOUNTER — Ambulatory Visit (INDEPENDENT_AMBULATORY_CARE_PROVIDER_SITE_OTHER): Payer: Medicaid Other | Admitting: Neurology

## 2021-12-07 VITALS — BP 150/103 | HR 84 | Ht 67.5 in | Wt 212.0 lb

## 2021-12-07 DIAGNOSIS — G35 Multiple sclerosis: Secondary | ICD-10-CM

## 2021-12-07 DIAGNOSIS — M545 Low back pain, unspecified: Secondary | ICD-10-CM

## 2021-12-07 DIAGNOSIS — G43709 Chronic migraine without aura, not intractable, without status migrainosus: Secondary | ICD-10-CM | POA: Diagnosis not present

## 2021-12-07 DIAGNOSIS — Z79899 Other long term (current) drug therapy: Secondary | ICD-10-CM | POA: Diagnosis not present

## 2021-12-07 DIAGNOSIS — R200 Anesthesia of skin: Secondary | ICD-10-CM

## 2021-12-07 DIAGNOSIS — R202 Paresthesia of skin: Secondary | ICD-10-CM

## 2021-12-07 DIAGNOSIS — G8929 Other chronic pain: Secondary | ICD-10-CM | POA: Diagnosis not present

## 2021-12-07 DIAGNOSIS — M79602 Pain in left arm: Secondary | ICD-10-CM | POA: Diagnosis not present

## 2021-12-07 MED ORDER — IMIPRAMINE HCL 25 MG PO TABS
25.0000 mg | ORAL_TABLET | Freq: Every day | ORAL | 5 refills | Status: DC
Start: 2021-12-07 — End: 2021-12-30

## 2021-12-07 NOTE — Progress Notes (Signed)
[facility]  PATIENT: [name] DOB: [birth year]  REFERRING DOCTOR OR PCP: Roland Rack, MD SOURCE: Patient, notes from recent hospital admission, imaging and laboratory reports, MRI images personally reviewed.  _________________________________   HISTORICAL  CHIEF COMPLAINT:  Chief Complaint  Patient presents with   Follow-up    Pt alone, Rm 2, MS follow up, DMT: Copaxone. she states that there is a increase in headaches. She went to ER 10/16/21 because of the headaches. She describes pain in lower back worsening. "Feels like lower back is on fire" she has stopped taking a lot of the headache medications that were prescribed because she felt no benefit    HISTORY OF PRESENT ILLNESS:  [name] is a 46 y.o. woman with relapsing remitting multiple sclerosis.  Update 12/07/2021 She has been on glatiramer since 05/2020.   She notes nodules in her skin x 1 week after each injection that can be painful.  Sometimes has redness after the injections.  She wished to discuss other options for treatment..  She notes no exacerbations.      She felt SOB and went to the ED.   She had MRI of the brai and spine.    MRI of the brain 10/16/2021 showed no new lesion. She has multiple T2/FLAIR foci in the hemispheres and pons and midbrain c/w chronic MS lesions.   She has a 50m perifalcine meningioma.   MRI of the cervical spine showed MS lesions (unchanged) at C2 and C5=C6.    And mild spondylosis.  MRI thoracic spine 10/16/2021 showed chronic MS lesions at T4 and T9T10 (no comparison films) and a left lower pulmonary mass.    CTA chest 10/16/2021 showed a "Pulmonary nodules in the left upper lobe (in 3 locations) and in 2 locations in the left lower lobe. The largest is a mass in the left lower lobe measuring 3.4 cm, previously 2.8 cm by PET CT comparison. Milder nodularity in the right lung including a 4-5 mm nodule in the right lower lobe on 7:82."     PET scan is  scheduled.   A biopsy either bronchoscopic or other will be scheduled after the PET according to the patient.    She has numbness that shoots down the left arm (was right > left a onset) .   The numbness comes and goes not triggered by activity.    This happens more if she is active as in walking a longer distance.   She denies weakness or clumsiness.   She will somtimes feel dizzy, especially if she bends forward.    Vision is more blurry   Bladder function is fine.    She notes fatigue.   She has poor sleep taking a nap x 2 hours in the afternoon due to poor sleep at night.  Sleep helps her headache  SHe denies neck pain but has LBP and left arm pain.    She gets migraine headaches on a daily basis (30/30 days) > 4 hours a day. . Sumatriptan only helped a few times.   Fioricet sometimes helps.   Pain can be occipital or temporal.  .   She denies nausea or photophobia.   She hears a cracking sound in her neck before the HA starts.     Sleep sometimes helps the headaches  Medications tried:  NSAID (meloxicam), antiepilepic (zonisamide), triptan (sumatriptan  MS history: She began experiencing right-sided paresthesias in the right arm and leg in November 2021.  Symptoms would come  and go.  She had no weakness or gait difficulty.    She presented to the emergency room.  MRI of the brain showed some white matter foci.  This was followed up with an MRI of the December cervical spine that showed 2 foci, one centrally at C2-C3 and 1 posteriorly to the right at C5-C6.   She continues to have episodes of random numbness with some tingling.    Sometimes she has symptoms shooting down her spine.     Imaging:  MRI of the brain 10/16/2021 showed no new lesion. She has multiple T2/FLAIR foci in the hemispheres and pons and midbrain c/w chronic MS lesions.   She has a 16m perifalcine meningioma.     MRI of the cervical spine 10/16/2021 showed MS lesions (unchanged) at C2 and C5=C6.    And mild spondylosis.     MRI thoracic spine 10/16/2021 showed chronic MS lesions at T4 and T9T10 (no comparison films) and a left lower pulmonary mass.   CTA chest 10/16/2021 showed a "Pulmonary nodules in the left upper lobe (in 3 locations) and in 2 locations in the left lower lobe. The largest is a mass in the left lower lobe measuring 3.4 cm, previously 2.8 cm by PET CT comparison. Milder nodularity in the right lung including a 4-5 mm nodule in the right lower lobe on 7:82."       MRI of the brain 04/11/2020 showed multiple T2/FLAIR hyperintense foci in the periventricular, juxtacortical and deep white matter.  One focus in the right parietal lobe had subtle enhancement..  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  MRI of the cervical spine 04/13/2020 showed T2 hyperintense foci centrally adjacent to C2-C3, posteriorly to the right adjacent to C5-C6.  He had mild disc bulges and reversal of the cervical curvature.  Vitamin D is low at 13.  ANA, SSA/SSB are normal/negative.  SARS COVID 2 PCR was negative  Family history: Her daughter was diagnosed with MS diagnosed in November 2021 (presented with diplopia).  She is on Gilenya.   She also has a first cousin with MS.      REVIEW OF SYSTEMS: Constitutional: No fevers, chills, sweats, or change in appetite Eyes: No visual changes, double vision, eye pain Ear, nose and throat: No hearing loss, ear pain, nasal congestion, sore throat Cardiovascular: No chest pain, palpitations Respiratory:  No shortness of breath at rest or with exertion.   No wheezes GastrointestinaI: No nausea, vomiting, diarrhea, abdominal pain, fecal incontinence Genitourinary:  No dysuria, urinary retention or frequency.  No nocturia. Musculoskeletal:  No neck pain, back pain Integumentary: No rash, pruritus, skin lesions Neurological: as above Psychiatric: No depression at this time.  No anxiety Endocrine: No palpitations, diaphoresis, change in appetite,  change in weigh or increased thirst Hematologic/Lymphatic:  No anemia, purpura, petechiae. Allergic/Immunologic: No itchy/runny eyes, nasal congestion, recent allergic reactions, rashes  ALLERGIES: Allergies  Allergen Reactions   Pork-Derived Products     Religous beliefs. Ok with lovenox.   Latex Rash    HOME MEDICATIONS:  Current Outpatient Medications:    Aspirin-Caffeine (BAYER BACK & BODY PO), Take 1 tablet by mouth every 6 (six) hours as needed (back pain)., Disp: , Rfl:    COPAXONE 40 MG/ML SOSY, Inject 40 mg into the skin 3 (three) times a week. Brand name-Copaxone, Disp: 36 mL, Rfl: 3   imipramine (TOFRANIL) 25 MG tablet, Take 1 tablet (25 mg total) by mouth at bedtime., Disp:  30 tablet, Rfl: 5   meloxicam (MOBIC) 7.5 MG tablet, Take 1 tablet (7.5 mg total) by mouth daily. (Patient taking differently: Take 7.5 mg by mouth daily as needed (migraines).), Disp: 30 tablet, Rfl: 11   SUMAtriptan (IMITREX) 100 MG tablet, Take 1 tablet (100 mg total) by mouth once as needed for up to 1 dose for migraine. May repeat in 2 hours if headache persists or recurs., Disp: 10 tablet, Rfl: 11  PAST MEDICAL HISTORY: Past Medical History:  Diagnosis Date   Anxiety    BMI 30.0-30.9,adult    Depression    Uterine fibroid     PAST SURGICAL HISTORY: Past Surgical History:  Procedure Laterality Date   ABDOMINAL HYSTERECTOMY     2010   CESAREAN SECTION     x 2    FAMILY HISTORY: Family History  Problem Relation Age of Onset   Cancer Mother    HIV Father    Multiple sclerosis Daughter    Multiple sclerosis Cousin     SOCIAL HISTORY:  Social History   Socioeconomic History   Marital status: Single    Spouse name: Not on file   Number of children: Not on file   Years of education: Not on file   Highest education level: Not on file  Occupational History   Not on file  Tobacco Use   Smoking status: Never   Smokeless tobacco: Never  Vaping Use   Vaping Use: Never used   Substance and Sexual Activity   Alcohol use: No   Drug use: No   Sexual activity: Not on file  Other Topics Concern   Not on file  Social History Narrative   Right handed   1 cup coffee per day   Social Determinants of Health   Financial Resource Strain: Not on file  Food Insecurity: Not on file  Transportation Needs: Not on file  Physical Activity: Not on file  Stress: Not on file  Social Connections: Not on file  Intimate Partner Violence: Not on file     PHYSICAL EXAM  Vitals:   12/07/21 0935  BP: (!) 150/103  Pulse: 84  Weight: 212 lb (96.2 kg)  Height: 5' 7.5" (1.715 m)    Body mass index is 32.71 kg/m.   General: The patient is well-developed and well-nourished and in no acute distress  HEENT:  Head is [place]/AT.  Sclera are anicteric.   Skin: Extremities are without rash or  edema.  Neurologic Exam  Mental status: The patient is alert and oriented x 3 at the time of the examination. The patient has apparent normal recent and remote memory, with an apparently normal attention span and concentration ability.   Speech is normal.  Cranial nerves: Extraocular movements are full.  Facial strength and sensation was normal.. No obvious hearing deficits are noted.  Motor:  Muscle bulk is normal.   Tone is normal. Strength is  5 / 5 in all 4 extremities.   Sensory: Sensory testing is intact to pinprick, soft touch and vibration sensation in all 4 extremities.  Coordination: Cerebellar testing reveals good finger-nose-finger and heel-to-shin bilaterally.  Gait and station: Station is normal.  Gait is normal.  Tandem gait is mildly wide.. Romberg is negative.   Reflexes: Deep tendon reflexes are symmetric and normal bilaterally.  Plantar responses flexor.   DIAGNOSTIC DATA (LABS, IMAGING, TESTING) - I reviewed patient records, labs, notes, testing and imaging myself where available.  Lab Results  Component Value Date   WBC  5.2 10/16/2021   HGB 11.2 (L)  10/16/2021   HCT 37.8 10/16/2021   MCV 74.1 (L) 10/16/2021   PLT 338 10/16/2021      Component Value Date/Time   NA 139 10/16/2021 0752   K 4.1 10/16/2021 0752   CL 108 10/16/2021 0752   CO2 27 10/16/2021 0752   GLUCOSE 103 (H) 10/16/2021 0752   BUN 11 10/16/2021 0752   CREATININE 0.59 10/16/2021 0752   CALCIUM 8.9 10/16/2021 0752   PROT 7.2 10/16/2021 0752   ALBUMIN 3.4 (L) 10/16/2021 0752   AST 18 10/16/2021 0752   ALT 14 10/16/2021 0752   ALKPHOS 49 10/16/2021 0752   BILITOT 0.4 10/16/2021 0752   GFRNONAA >60 10/16/2021 0752   GFRAA >60 07/13/2017 0339   Lab Results  Component Value Date   CHOL 118 04/11/2020   HDL 37 (L) 04/11/2020   LDLCALC 72 04/11/2020   TRIG 43 04/11/2020   CHOLHDL 3.2 04/11/2020   Lab Results  Component Value Date   HGBA1C 5.9 (H) 04/11/2020   Lab Results  Component Value Date   GTXMIWOE32 122 04/12/2020   No results found for: "TSH"     ASSESSMENT AND PLAN  Multiple sclerosis (HCC)  Numbness and tingling of right arm and leg  Chronic bilateral low back pain without sciatica  Chronic migraine w/o aura w/o status migrainosus, not intractable  Left arm pain    Due to worsening skin reactions we will d/c glatiramer and consider Kesimpta.   We will check labs for this possibility.  MRIs last month were ok - no new lesions.    Consider a switch if the skin reactions worsen Imipramine for HA prophylaxis and may hel sleep Sample Nurtec 75 mg.   She is going to be having additional studies to work-up the pulmonary abnormality.   Return in 6 months or sooner if there are new or worsening neurologic symptoms.  Youssef Footman A. Felecia Shelling, MD, [facility] 08/14/2498, 37:04 AM Certified in Neurology, Clinical Neurophysiology, Sleep Medicine and Neuroimaging  [facility] [address] [place], [place] [address] [phone]

## 2021-12-10 LAB — CBC WITH DIFFERENTIAL/PLATELET
Basophils Absolute: 0 10*3/uL (ref 0.0–0.2)
Basos: 1 %
EOS (ABSOLUTE): 0.2 10*3/uL (ref 0.0–0.4)
Eos: 5 %
Hematocrit: 38.6 % (ref 34.0–46.6)
Hemoglobin: 11.5 g/dL (ref 11.1–15.9)
Immature Grans (Abs): 0 10*3/uL (ref 0.0–0.1)
Immature Granulocytes: 0 %
Lymphocytes Absolute: 1.6 10*3/uL (ref 0.7–3.1)
Lymphs: 41 %
MCH: 21.5 pg — ABNORMAL LOW (ref 26.6–33.0)
MCHC: 29.8 g/dL — ABNORMAL LOW (ref 31.5–35.7)
MCV: 72 fL — ABNORMAL LOW (ref 79–97)
Monocytes Absolute: 0.1 10*3/uL (ref 0.1–0.9)
Monocytes: 4 %
Neutrophils Absolute: 2 10*3/uL (ref 1.4–7.0)
Neutrophils: 49 %
Platelets: 327 10*3/uL (ref 150–450)
RBC: 5.36 x10E6/uL — ABNORMAL HIGH (ref 3.77–5.28)
RDW: 14.8 % (ref 11.7–15.4)
WBC: 4 10*3/uL (ref 3.4–10.8)

## 2021-12-10 LAB — QUANTIFERON-TB GOLD PLUS
QuantiFERON Mitogen Value: >10 IU/mL
QuantiFERON Nil Value: 0 IU/mL
QuantiFERON TB1 Ag Value: 0.02 IU/mL
QuantiFERON TB2 Ag Value: 0.06 IU/mL
QuantiFERON-TB Gold Plus: NEGATIVE

## 2021-12-10 LAB — COMPREHENSIVE METABOLIC PANEL
ALT: 13 IU/L (ref 0–32)
AST: 12 IU/L (ref 0–40)
Albumin/Globulin Ratio: 1.5 (ref 1.2–2.2)
Albumin: 4.1 g/dL (ref 3.9–4.9)
Alkaline Phosphatase: 65 IU/L (ref 44–121)
BUN/Creatinine Ratio: 13 (ref 9–23)
BUN: 9 mg/dL (ref 6–24)
Bilirubin Total: 0.2 mg/dL (ref 0.0–1.2)
CO2: 24 mmol/L (ref 20–29)
Calcium: 9.1 mg/dL (ref 8.7–10.2)
Chloride: 104 mmol/L (ref 96–106)
Creatinine, Ser: 0.69 mg/dL (ref 0.57–1.00)
Globulin, Total: 2.8 g/dL (ref 1.5–4.5)
Glucose: 87 mg/dL (ref 70–99)
Potassium: 4.8 mmol/L (ref 3.5–5.2)
Sodium: 141 mmol/L (ref 134–144)
Total Protein: 6.9 g/dL (ref 6.0–8.5)
eGFR: 110 mL/min/{1.73_m2} (ref >59)

## 2021-12-10 LAB — IGG, IGA, IGM
IgA/Immunoglobulin A, Serum: 260 mg/dL (ref 87–352)
IgG (Immunoglobin G), Serum: 1360 mg/dL (ref 586–1602)
IgM (Immunoglobulin M), Srm: 91 mg/dL (ref 26–217)

## 2021-12-10 LAB — HEPATITIS B SURFACE ANTIGEN: Hepatitis B Surface Ag: NEGATIVE

## 2021-12-10 LAB — HEPATITIS B CORE ANTIBODY, TOTAL: Hep B Core Total Ab: NEGATIVE

## 2021-12-10 LAB — HEPATITIS B SURFACE ANTIBODY,QUALITATIVE: Hep B Surface Ab, Qual: NONREACTIVE

## 2021-12-10 LAB — HIV ANTIBODY (ROUTINE TESTING W REFLEX): HIV Screen 4th Generation wRfx: NONREACTIVE

## 2021-12-10 LAB — VARICELLA ZOSTER ANTIBODY, IGG: Varicella zoster IgG: 1192 index (ref >165)

## 2021-12-13 ENCOUNTER — Encounter: Payer: Self-pay | Admitting: *Deleted

## 2021-12-14 DIAGNOSIS — R918 Other nonspecific abnormal finding of lung field: Secondary | ICD-10-CM | POA: Diagnosis not present

## 2021-12-14 NOTE — Telephone Encounter (Signed)
Faxed completed/signed Kesimpta start form to [employer] at [phone]. Received fax confirmation.  

## 2021-12-16 ENCOUNTER — Telehealth: Payer: Self-pay | Admitting: Neurology

## 2021-12-16 NOTE — Telephone Encounter (Signed)
The kesimpta rep called and states the [facility] card sent in is no longer active. The patient brought a card in [facility] [employer] and I provided the ID number that I had. They asked I call the patient.   Called the patient and she answered. She will attempt to upload the card onto mychart since it was not scanned into media.  I provided her with the [place] phone number and she states she will call. While we were on the phone she states the imipramine that was given for headaches is not helping her headaches and is actually causing her to be too drowsy.  She says that she tries taking it earlier in the day to see if the effects of the drowsiness will wear off by the time she wakes up the next day and it still is causing her to be drowsy but not having effects on her headache.  Advised I would inform Dr. Rachel Moulds and see what he recommends I will be in touch

## 2021-12-20 ENCOUNTER — Encounter: Payer: Self-pay | Admitting: Family Medicine

## 2021-12-20 MED ORDER — EMGALITY 120 MG/ML ~~LOC~~ SOAJ: 240.0000 mg | Freq: Once | SUBCUTANEOUS | 0 refills | Status: AC

## 2021-12-20 MED ORDER — EMGALITY 120 MG/ML ~~LOC~~ SOSY
120.0000 mg | PREFILLED_SYRINGE | SUBCUTANEOUS | 5 refills | Status: DC
Start: 2022-01-21 — End: 2022-01-03

## 2021-12-20 NOTE — Telephone Encounter (Signed)
Called the patient back to advise that since she has tried and failed imipramine and zonisamide. I will attempt a PA for Emgality. I have advised this is what Dr.Sater would recommend trying to see if she notes benefit. Unfortunately we do not have samples and this is the preferred medication. I will go ahead and call into the pharmacy. She will take 2 injections the first month and then one injection each month after.

## 2021-12-20 NOTE — Telephone Encounter (Signed)
Completed kesimpta PA via CMM. Key: [id number]. Sent to [employer]. Should have a determination within 3-5 business days.

## 2021-12-20 NOTE — Addendum Note (Signed)
Addended by: Darleen Crocker on: 12/20/2021 10:59 AM   Modules accepted: Orders

## 2021-12-21 NOTE — Telephone Encounter (Signed)
Received a fax from [employer] that they were unable to find the patient and complete the PA. I called [employer] at [phone]. I completed PA for kesimpta loading and maintenance doses over the phone. A determination will be made within 24 hours and will be faxed to [place].   Loading dose PA ID: [id number] Maintenance dose PA ID: [id number].

## 2021-12-22 ENCOUNTER — Telehealth: Payer: Self-pay

## 2021-12-22 NOTE — Telephone Encounter (Signed)
Pa for emgality has been sent. (Key: [id number]) Rx #: [id number] Emgality '120MG'$ /ML syringes (migraine)   Form [place] Medicaid Migraine Calcitonin Agents Preventative Prior [place] [phone] phone [phone] fax

## 2021-12-23 DIAGNOSIS — R918 Other nonspecific abnormal finding of lung field: Secondary | ICD-10-CM | POA: Diagnosis not present

## 2021-12-23 DIAGNOSIS — R590 Localized enlarged lymph nodes: Secondary | ICD-10-CM | POA: Diagnosis not present

## 2021-12-25 DIAGNOSIS — H52223 Regular astigmatism, bilateral: Secondary | ICD-10-CM | POA: Diagnosis not present

## 2021-12-27 NOTE — Telephone Encounter (Signed)
[place] has denied coverage of Kesimpta.  They require the patient try Avonex, Betaseron, and rebif.  We will appeal this decision.  Patient will need to sign the appeal request form consenting to this appeal.  I will send it to her via [place].

## 2021-12-28 ENCOUNTER — Telehealth: Payer: Self-pay | Admitting: Neurology

## 2021-12-28 NOTE — Telephone Encounter (Signed)
PA aprroved by [employer].   Qty:3 for 90 days has been approved for 3 months from 12/23/21-03/25/22.

## 2021-12-28 NOTE — Telephone Encounter (Signed)
[facility] [facility] [place]) following up on [place] for [employer].  If has been denied can call or fax denial dates and numbers. Would like a call from the nurse.

## 2021-12-28 NOTE — Telephone Encounter (Signed)
We are aware that her insurance has denied coverage of Kesimpta.  We are waiting on patient to sign the appeal consent for [place] to appeal this decision.    I called patient to discuss if she has been able to review the consent. No answer, left a message asking her to call us back.  Patient may also call [employer] at [phone] and ask for an appeal and provide the information requested (Dr. Felecia Shelling will be appealing this decision about kesimpta, etc.) If patient calls back please discuss this with her. She may also send the signed form back to [place] via mychart if easier.  Appeal letter given to Dr. Felecia Shelling for review and signature.

## 2021-12-29 ENCOUNTER — Other Ambulatory Visit: Payer: Self-pay | Admitting: Neurology

## 2021-12-30 ENCOUNTER — Telehealth: Payer: Self-pay | Admitting: Neurology

## 2021-12-30 MED ORDER — EMGALITY 120 MG/ML ~~LOC~~ SOAJ: 240.0000 mg | Freq: Once | SUBCUTANEOUS | 0 refills | Status: AC

## 2021-12-30 NOTE — Telephone Encounter (Signed)
Called the patient to make her aware that the initial dose is 2 injections. Advised we have received coverage from the insurance covering the 1 pen per 30 days. Informed her that I have set aside the sample because it has two in it to allow her to use that for her first dose.  Pt may not be able to make it to pick it up today but may send her daughter. Her daughter's name is Infinity Muhammad-Brown. Informed her to bring Id just in case they ask to see that.  She was appreciative for the call.

## 2021-12-30 NOTE — Telephone Encounter (Signed)
Pt said pharmacy needs PA for second pen for first dosage for Galcanezumab-gnlm (EMGALITY) 120 MG/ML SOSY. Would like a call from the nurse

## 2021-12-30 NOTE — Addendum Note (Signed)
Addended by: Darleen Crocker on: 12/30/2021 11:09 AM   Modules accepted: Orders

## 2021-12-30 NOTE — Telephone Encounter (Signed)
Offering a sample for the pt to come pick up to get the initial dose and the pa has been approved for the patient to get following months covered. Waiting to hear from the patient

## 2022-01-03 DIAGNOSIS — M5441 Lumbago with sciatica, right side: Secondary | ICD-10-CM | POA: Diagnosis not present

## 2022-01-03 DIAGNOSIS — G8929 Other chronic pain: Secondary | ICD-10-CM | POA: Diagnosis not present

## 2022-01-03 DIAGNOSIS — L2084 Intrinsic (allergic) eczema: Secondary | ICD-10-CM | POA: Diagnosis not present

## 2022-01-03 DIAGNOSIS — G35 Multiple sclerosis: Secondary | ICD-10-CM | POA: Diagnosis not present

## 2022-01-03 DIAGNOSIS — R030 Elevated blood-pressure reading, without diagnosis of hypertension: Secondary | ICD-10-CM | POA: Diagnosis not present

## 2022-01-03 NOTE — Telephone Encounter (Signed)
I called patient.  She reports that her insurance is changing September 1st.  She also reports that her primary care office showed her how to inject Copaxone in a different way which has reduced her skin reactions.  She would prefer to remain on Copaxone at this time.  If she decides that she would like to start Ipava with her new insurance she will let us know.  She denies needing a refill on her Copaxone at this time.  I advised her that I will let Dr. Felecia Shelling know of this and will call her back with any other recommendations.  Patient verbalized understanding.

## 2022-01-18 ENCOUNTER — Encounter: Payer: Self-pay | Admitting: Neurology

## 2022-01-20 ENCOUNTER — Telehealth: Payer: Self-pay | Admitting: *Deleted

## 2022-01-20 NOTE — Telephone Encounter (Signed)
Submitted PA Emgality on CMM. [id number]. Waiting on determination from [place].

## 2022-01-20 NOTE — Telephone Encounter (Signed)
Received fax from [place] that [place] approved 01/20/22-04/21/22.

## 2022-01-27 ENCOUNTER — Other Ambulatory Visit: Payer: Self-pay | Admitting: *Deleted

## 2022-01-27 DIAGNOSIS — R200 Anesthesia of skin: Secondary | ICD-10-CM

## 2022-02-01 ENCOUNTER — Telehealth: Payer: Self-pay | Admitting: Neurology

## 2022-02-01 NOTE — Telephone Encounter (Signed)
Referral for orthopedic surgery sent to [facility]. TDDUK:[phone], Fax: [phone]

## 2022-02-07 DIAGNOSIS — G8929 Other chronic pain: Secondary | ICD-10-CM | POA: Diagnosis not present

## 2022-02-07 DIAGNOSIS — M5441 Lumbago with sciatica, right side: Secondary | ICD-10-CM | POA: Diagnosis not present

## 2022-02-08 ENCOUNTER — Emergency Department (HOSPITAL_COMMUNITY)
Admission: EM | Admit: 2022-02-08 | Discharge: 2022-02-09 | Disposition: A | Discharge status: Home / Self Care | Payer: BC Managed Care – PPO | Attending: Emergency Medicine | Admitting: Emergency Medicine

## 2022-02-08 ENCOUNTER — Encounter (HOSPITAL_COMMUNITY): Payer: Self-pay

## 2022-02-08 ENCOUNTER — Emergency Department (HOSPITAL_COMMUNITY): Payer: BC Managed Care – PPO

## 2022-02-08 DIAGNOSIS — M79601 Pain in right arm: Secondary | ICD-10-CM | POA: Insufficient documentation

## 2022-02-08 DIAGNOSIS — Z91040 Latex allergy status: Secondary | ICD-10-CM | POA: Diagnosis not present

## 2022-02-08 DIAGNOSIS — G35 Multiple sclerosis: Secondary | ICD-10-CM | POA: Diagnosis not present

## 2022-02-08 DIAGNOSIS — M79641 Pain in right hand: Secondary | ICD-10-CM

## 2022-02-08 DIAGNOSIS — G9389 Other specified disorders of brain: Secondary | ICD-10-CM | POA: Diagnosis not present

## 2022-02-08 DIAGNOSIS — R202 Paresthesia of skin: Secondary | ICD-10-CM | POA: Insufficient documentation

## 2022-02-08 HISTORY — DX: Multiple sclerosis, unspecified: G35.D

## 2022-02-08 HISTORY — DX: Multiple sclerosis: G35

## 2022-02-08 LAB — CBC WITH DIFFERENTIAL/PLATELET
Abs Immature Granulocytes: 0.01 10*3/uL (ref 0.00–0.07)
Basophils Absolute: 0 10*3/uL (ref 0.0–0.1)
Basophils Relative: 1 %
Eosinophils Absolute: 0.2 10*3/uL (ref 0.0–0.5)
Eosinophils Relative: 4 %
HCT: 39.1 % (ref 36.0–46.0)
Hemoglobin: 11.6 g/dL — ABNORMAL LOW (ref 12.0–15.0)
Immature Granulocytes: 0 %
Lymphocytes Relative: 42 %
Lymphs Abs: 2.3 10*3/uL (ref 0.7–4.0)
MCH: 21.8 pg — ABNORMAL LOW (ref 26.0–34.0)
MCHC: 29.7 g/dL — ABNORMAL LOW (ref 30.0–36.0)
MCV: 73.5 fL — ABNORMAL LOW (ref 80.0–100.0)
Monocytes Absolute: 0.2 10*3/uL (ref 0.1–1.0)
Monocytes Relative: 4 %
Neutro Abs: 2.7 10*3/uL (ref 1.7–7.7)
Neutrophils Relative %: 49 %
Platelets: 362 10*3/uL (ref 150–400)
RBC: 5.32 MIL/uL — ABNORMAL HIGH (ref 3.87–5.11)
RDW: 14.9 % (ref 11.5–15.5)
WBC: 5.5 10*3/uL (ref 4.0–10.5)
nRBC: 0 % (ref 0.0–0.2)

## 2022-02-08 LAB — VITAMIN B12: Vitamin B-12: 1089 pg/mL — ABNORMAL HIGH (ref 180–914)

## 2022-02-08 LAB — BASIC METABOLIC PANEL
Anion gap: 4 — ABNORMAL LOW (ref 5–15)
BUN: 9 mg/dL (ref 6–20)
CO2: 28 mmol/L (ref 22–32)
Calcium: 9 mg/dL (ref 8.9–10.3)
Chloride: 107 mmol/L (ref 98–111)
Creatinine, Ser: 0.63 mg/dL (ref 0.44–1.00)
GFR, Estimated: >60 mL/min (ref >60)
Glucose, Bld: 100 mg/dL — ABNORMAL HIGH (ref 70–99)
Potassium: 3.7 mmol/L (ref 3.5–5.1)
Sodium: 139 mmol/L (ref 135–145)

## 2022-02-08 LAB — MAGNESIUM: Magnesium: 2.1 mg/dL (ref 1.7–2.4)

## 2022-02-08 LAB — FOLATE: Folate: 12.9 ng/mL (ref >5.9)

## 2022-02-08 MED ORDER — GADOPICLENOL 0.5 MMOL/ML IV SOLN
9.5000 mL | Freq: Once | INTRAVENOUS | Status: AC | PRN
  Administered 2022-02-08: 9.5 mL via INTRAVENOUS

## 2022-02-08 NOTE — ED Triage Notes (Signed)
Pt states that she began noticing paresthesias in her R thumb approximately 1-2 weeks ago. Pt stating that 3 days ago it began progressing and now she has numbness up her R arm stopping near her humerus. Denies known injury/trauma. 5/5 grip strength. PMS intact. Pt has hx of MS.

## 2022-02-08 NOTE — ED Provider Triage Note (Signed)
Emergency Medicine Provider Triage Evaluation Note  [name] , a 46 y.o. female  was evaluated in triage.  Pt complains of left arm numbness.  Patient states that about 2 weeks ago she started noticing some sensation in her right thumb that felt like her thumb and fall asleep with associated tingling.  She says over the past 2 days now she has noticed that tingling sensation go all the way up to her elbow and sometimes even her shoulder.  She says it is painful when she touches it.  She denies any history of trauma to her head or her neck.  She denies any problems with range of motion of her shoulder or spine.  She denies any fevers or chills, weakness of her upper extremities, gait abnormalities.   She does have a history of MS.  Is not on any anticoagulation.  Review of Systems  Positive:  Negative:   Physical Exam  BP (!) 178/108 (BP Location: Left Arm)   Pulse 84   Temp 98.1 F (36.7 C)   Resp 18   SpO2 100%  Gen:   Awake, no distress   Resp:  Normal effort  MSK:   Moves extremities without difficulty  Other:  No focal weakness on exam. Subjective decreased RUE sensation only in dorsum of hand and forearm on the radial side.  Medical Decision Making  Medically screening exam initiated at 3:18 PM.  Appropriate orders placed.  ZAYNEB BAUCUM was informed that the remainder of the evaluation will be completed by another provider, this initial triage assessment does not replace that evaluation, and the importance of remaining in the ED until their evaluation is complete.     Adolphus Birchwood, PA-C 02/08/22 1521

## 2022-02-08 NOTE — ED Notes (Signed)
RN went to introduce self to patient. She is in MRI.

## 2022-02-08 NOTE — ED Provider Notes (Signed)
[facility] DEPT Provider Note   CSN: [id number] Arrival date & time: 02/08/22  1448     History  Chief Complaint  Patient presents with   Numbness    [name] is a 46 y.o. female with past medical history of multiple sclerosis who presents emergency department with right arm pain, paresthesia and numbness.  She states that the symptoms began in her right thumb then progressed to the other fingers in her hand.  Her pain is constant and now radiates up to her shoulder.  Nothing seems to make it any better.  She has tried taking over-the-counter pain medicines.  She has used a wrist brace.  She states that she saw her MS specialist who told her it was not likely to be her MS and referred her to a hand specialist with whom she has not seen.  Patient states that her pain is worse when her skin is brushed lately.  She does not type use her hands frequently for work.  HPI     Home Medications Prior to Admission medications   Medication Sig Start Date End Date Taking? Authorizing Provider  Aspirin-Caffeine (BAYER BACK & BODY PO) Take 1 tablet by mouth every 6 (six) hours as needed (back pain).    [provider]  COPAXONE 40 MG/ML SOSY Inject 40 mg into the skin 3 (three) times a week. Brand name-Copaxone 10/18/21   Sater, Nanine Means, MD  imipramine (TOFRANIL) 25 MG tablet TAKE 1 TABLET BY MOUTH EVERYDAY AT BEDTIME 12/30/21   Sater, Nanine Means, MD  meloxicam (MOBIC) 7.5 MG tablet Take 1 tablet (7.5 mg total) by mouth daily. Patient taking differently: Take 7.5 mg by mouth daily as needed (migraines). 05/19/21   Lomax, Amy, NP  SUMAtriptan (IMITREX) 100 MG tablet Take 1 tablet (100 mg total) by mouth once as needed for up to 1 dose for migraine. May repeat in 2 hours if headache persists or recurs. 05/19/21   Lomax, Amy, NP      Allergies    Pork-derived products and Latex    Review of Systems   Review of Systems  Physical Exam Updated Vital  Signs BP (!) 153/100   Pulse 75   Temp (!) 97.5 F (36.4 C) (Oral)   Resp 16   SpO2 100%  Physical Exam Vitals and nursing note reviewed.  Constitutional:      General: She is not in acute distress.    Appearance: She is well-developed. She is not diaphoretic.  HENT:     Head: Normocephalic and atraumatic.     Right Ear: External ear normal.     Left Ear: External ear normal.     Nose: Nose normal.     Mouth/Throat:     Mouth: Mucous membranes are moist.  Eyes:     General: No scleral icterus.    Conjunctiva/sclera: Conjunctivae normal.  Cardiovascular:     Rate and Rhythm: Normal rate and regular rhythm.     Heart sounds: Normal heart sounds. No murmur heard.    No friction rub. No gallop.  Pulmonary:     Effort: Pulmonary effort is normal. No respiratory distress.     Breath sounds: Normal breath sounds.  Abdominal:     General: Bowel sounds are normal. There is no distension.     Palpations: Abdomen is soft. There is no mass.     Tenderness: There is no abdominal tenderness. There is no guarding.  Musculoskeletal:  Cervical back: Normal range of motion.     Comments: No weakness, negative Tinel's and Phalen's, normal strength, negative Hoffmann's test, normal radial pulse  Skin:    General: Skin is warm and dry.  Neurological:     Mental Status: She is alert and oriented to person, place, and time.  Psychiatric:        Behavior: Behavior normal.     ED Results / Procedures / Treatments   Labs (all labs ordered are listed, but only abnormal results are displayed) Labs Reviewed  BASIC METABOLIC PANEL - Abnormal; Notable for the following components:      Result Value   Glucose, Bld 100 (*)    Anion gap 4 (*)    All other components within normal limits  CBC WITH DIFFERENTIAL/PLATELET - Abnormal; Notable for the following components:   RBC 5.32 (*)    Hemoglobin 11.6 (*)    MCV 73.5 (*)    MCH 21.8 (*)    MCHC 29.7 (*)    All other components within  normal limits  VITAMIN B12 - Abnormal; Notable for the following components:   Vitamin B-12 1,089 (*)    All other components within normal limits  MAGNESIUM  FOLATE    EKG None  Radiology No results found.  Procedures Procedures    Medications Ordered in ED Medications - No data to display  ED Course/ Medical Decision Making/ A&P                           Medical Decision Making 46 year old female with with right arm pain.  Current plan is to check MRI head and C-spine with and without contrast to look for acute MS exacerbation.  If negative suspect she can go home with anti-inflammatories.  This may represent complex regional pain syndrome as light brushing of the skin seems to significantly increase her pain.  Does not appear to have any evidence of carpal tunnel syndrome or de Quervain's tenosynovitis.  Amount and/or Complexity of Data Reviewed Radiology: ordered.           Final Clinical Impression(s) / ED Diagnoses Final diagnoses:  None    Rx / DC Orders ED Discharge Orders     None         Margarita Mail, PA-C 02/08/22 2304    Leanord Asal K, DO 02/08/22 2353

## 2022-02-09 MED ORDER — NAPROXEN 500 MG PO TABS: 500.0000 mg | ORAL_TABLET | Freq: Two times a day (BID) | ORAL | 0 refills | Status: DC

## 2022-02-09 NOTE — Discharge Instructions (Addendum)
You were seen in the ER today for your right hand pain.  Your work-up in the ER was reassuring.  Does not appear to be a new MS lesion.  Please follow-up closely with your neurologist and with the hand specialist to whom you were previously referred.  You may use the prescribed anti-inflammatory medicine as needed at home in addition to Tylenol.  Return to the ER with any new severe symptoms.

## 2022-02-09 NOTE — ED Provider Notes (Signed)
  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 Will reassess patient at the bedside when she returns from imaging.  [RS]    Clinical Course User Index [RS] Shayla Heming, Gypsy Balsam, PA-C   Medical Decision Making Amount and/or Complexity of Data Reviewed Radiology: ordered.  Risk Prescription drug management.    Care of this patient assumed from preceding ED provider Margarita Mail, PA-C at time of shift change.  Please see her associated note for further insight of the patient's ED course.  In brief patient is Is a 46 year old female with history of MS who presents with concern for intermittent right hand pain and numbness that started in her thumb but now is in her entire right hand without injury or trauma.  No weakness in the hand on exam but exquisite sensitivity to light touch much more so than deep pressure palpation.  Laboratory studies are reassuring.  At time of shift change patient is awaiting results of MRI brain and C-spine.  Likely disposition home with outpatient follow-up.  MRIs with unchanged history of fusion of white matter lesions consistent with MS.  Unchanged distribution of hyperintense signals in the spinal cord at C2 and 3 and C5 and 6.  Suspicion for possible lesion at C4 poorly visualized on prior study but there does not appear to be any new or active demyelinating lesions on MRI today.  While the exact etiology of this patient's symptoms remains unclear if there is not appear to be any emergent cause at this time.  Recommend close outpatient follow-up with her neurologist, will also recommend she keeps follow-up appointment with hand specialist previously referred in the outpatient setting.  Willisha  voiced understanding of her  medical evaluation and treatment plan. Each of their questions answered to their expressed satisfaction.  Return precautions were given.  Patient is well-appearing, stable, and was discharged in good condition.  This chart was dictated using voice recognition software, Dragon. Despite the best efforts of this provider to proofread and correct errors, errors may still occur which can change documentation meaning.        Emeline Darling, PA-C 02/09/22 0200    Maudie Flakes, MD 02/09/22 [phone]

## 2022-05-28 ENCOUNTER — Encounter (HOSPITAL_COMMUNITY): Payer: Self-pay | Admitting: *Deleted

## 2022-05-28 ENCOUNTER — Other Ambulatory Visit: Payer: Self-pay

## 2022-05-28 ENCOUNTER — Emergency Department (HOSPITAL_COMMUNITY)
Admission: EM | Admit: 2022-05-28 | Discharge: 2022-05-28 | Disposition: A | Discharge status: Home / Self Care | Payer: Medicaid Other | Attending: Emergency Medicine | Admitting: Emergency Medicine

## 2022-05-28 DIAGNOSIS — Z91040 Latex allergy status: Secondary | ICD-10-CM | POA: Diagnosis not present

## 2022-05-28 DIAGNOSIS — R202 Paresthesia of skin: Secondary | ICD-10-CM | POA: Diagnosis not present

## 2022-05-28 DIAGNOSIS — Z7982 Long term (current) use of aspirin: Secondary | ICD-10-CM | POA: Insufficient documentation

## 2022-05-28 DIAGNOSIS — R200 Anesthesia of skin: Secondary | ICD-10-CM | POA: Diagnosis present

## 2022-05-28 MED ORDER — PREDNISONE 20 MG PO TABS: ORAL_TABLET | ORAL | 0 refills | Status: DC

## 2022-05-28 NOTE — ED Provider Notes (Signed)
[facility]ERG[facility] DEPARTMENT AT [facility] Provider Note   CSN: [id number] Arrival date & time: 05/28/22  1154     History  Chief Complaint  Patient presents with   Hand Problem   numbness/ MS ? related    [name] is a 47 y.o. female.  Patient with history of multiple sclerosis presents to the emergency department today for ongoing right arm and hand numbness.  She has a history of the same over the past several months.  During ED evaluation in October, she had MRI brain and C-spine which did not show any new lesions.  It sounds like her neurologist did not feel like her symptoms were related to [place] so she was referred to an orthopedist who did evaluation for carpal tunnel syndrome.  She had negative EMG and an injection which did not seem to help symptoms.  They did not feel that it was related to this therefore she was referred to a "neck doctor" and has an appointment with them in about 1 week.  She feels that the numbness is higher up in her arm.  She is concerned that this could be related to her MS. she states that she has tried to contact her neurologist but has not been able to get through to make an appointment. Patient denies other signs including: facial droop, slurred speech, aphasia, weakness/numbness in other extremities, imbalance/trouble walking.        Home Medications Prior to Admission medications   Medication Sig Start Date End Date Taking? Authorizing Provider  predniSONE (DELTASONE) 20 MG tablet 3 Tabs PO Days 1-3, then 2 tabs PO Days 4-6, then 1 tab PO Day 7-9, then Half Tab PO Day 10-12 05/28/22  Yes Anaiyah Anglemyer, Vonna Kotyk, PA-C  Aspirin-Caffeine (BAYER BACK & BODY PO) Take 1 tablet by mouth every 6 (six) hours as needed (back pain).    [provider]  COPAXONE 40 MG/ML SOSY Inject 40 mg into the skin 3 (three) times a week. Brand name-Copaxone 10/18/21   Sater, Nanine Means, MD  imipramine (TOFRANIL) 25 MG tablet TAKE 1 TABLET BY MOUTH  EVERYDAY AT BEDTIME 12/30/21   Sater, Nanine Means, MD  naproxen (NAPROSYN) 500 MG tablet Take 1 tablet (500 mg total) by mouth 2 (two) times daily. 02/09/22   Sponseller, Gypsy Balsam, PA-C  SUMAtriptan (IMITREX) 100 MG tablet Take 1 tablet (100 mg total) by mouth once as needed for up to 1 dose for migraine. May repeat in 2 hours if headache persists or recurs. 05/19/21   Lomax, Amy, NP      Allergies    Naproxen, Pork-derived products, and Latex    Review of Systems   Review of Systems  Physical Exam Updated Vital Signs BP (!) 152/86 (BP Location: Left Arm)   Pulse 91   Temp 98 F (36.7 C) (Oral)   Resp 18   Ht '5\' 8"'$  (1.727 m)   Wt 95.3 kg   SpO2 100%   BMI 31.93 kg/m  Physical Exam Vitals and nursing note reviewed.  Constitutional:      Appearance: She is well-developed.  HENT:     Head: Normocephalic and atraumatic.     Right Ear: Tympanic membrane, ear canal and external ear normal.     Left Ear: Tympanic membrane, ear canal and external ear normal.     Nose: Nose normal.     Mouth/Throat:     Pharynx: Uvula midline.  Eyes:     General: Lids are normal.  Extraocular Movements:     Right eye: No nystagmus.     Left eye: No nystagmus.     Conjunctiva/sclera: Conjunctivae normal.     Pupils: Pupils are equal, round, and reactive to light.  Cardiovascular:     Rate and Rhythm: Normal rate and regular rhythm.  Pulmonary:     Effort: Pulmonary effort is normal.     Breath sounds: Normal breath sounds.  Abdominal:     Palpations: Abdomen is soft.     Tenderness: There is no abdominal tenderness.  Musculoskeletal:     Cervical back: Normal range of motion and neck supple. No tenderness or bony tenderness.  Skin:    General: Skin is warm and dry.     Comments: Skin of the distal right upper extremity is normal.  Neurological:     Mental Status: She is alert and oriented to person, place, and time.     GCS: GCS eye subscore is 4. GCS verbal subscore is 5. GCS motor  subscore is 6.     Cranial Nerves: No cranial nerve deficit.     Motor: No weakness.     Coordination: Coordination normal.     Gait: Gait normal.     Comments: Patient with normal strength in right hand and wrist.  2+ radial pulse.  Reports decreased sensation to light touch.  Patient stands from a sitting position and ambulates in the room without difficulty.     ED Results / Procedures / Treatments   Labs (all labs ordered are listed, but only abnormal results are displayed) Labs Reviewed - No data to display  EKG None  Radiology No results found.  Procedures Procedures    Medications Ordered in ED Medications - No data to display  ED Course/ Medical Decision Making/ A&P    Patient seen and examined. History obtained directly from patient.  Reviewed recent ED visits as well as recent orthopedic hand surgery notes.  Reviewed most recent MRI results, I see that patient has had 3 MRIs over the course of 2023, none of which showed active demyelination.  Labs/EKG: None ordered  Imaging: None ordered.  Considered MRI brain and C-spine, however patient symptoms are mild.  Medications/Fluids: None ordered  Most recent vital signs reviewed and are as follows: BP (!) 152/86 (BP Location: Left Arm)   Pulse 91   Temp 98 F (36.7 C) (Oral)   Resp 18   Ht '5\' 8"'$  (1.727 m)   Wt 95.3 kg   SpO2 100%   BMI 31.93 kg/m   Initial impression: Right upper extremity paresthesias, subacute to chronic.  Home treatment plan: Will give steroid trial.  Return instructions discussed with patient: Patient counseled to return if they have weakness in their arms or legs, slurred speech, trouble walking or talking, confusion, trouble with their balance, or if they have any other concerns. Patient verbalizes understanding and agrees with plan.   Follow-up instructions discussed with patient: Follow-up with her neurologist as well as orthopedist as planned.  I have placed referral to  neurology on patient's behalf to her neurologist to see if this helps her get in for evaluation.                            Medical Decision Making Risk Prescription drug management.   Patient presents with ongoing right upper extremity paresthesias.  No significant weakness.  Paresthesias may be slightly worse than baseline, but overall mild.  Previous  MRIs did not show active demyelination.  Will give steroid trial.  She is due to follow-up with an orthopedist in regards to cervical radiculopathy as her recent evaluation for carpal tunnel was normal.  Considered imaging today or specialist evaluation/consultation however as patient symptoms are mild and subacute, feel that she can follow-up as outpatient.  Counseled on need to return for worsening or progressive symptoms.  The patient's vital signs, pertinent lab work and imaging were reviewed and interpreted as discussed in the ED course. Hospitalization was considered for further testing, treatments, or serial exams/observation. However as patient is well-appearing, has a stable exam, and reassuring studies today, I do not feel that they warrant admission at this time. This plan was discussed with the patient who verbalizes agreement and comfort with this plan and seems reliable and able to return to the Emergency Department with worsening or changing symptoms.          Final Clinical Impression(s) / ED Diagnoses Final diagnoses:  Paresthesia of right arm    Rx / DC Orders ED Discharge Orders          Ordered    predniSONE (DELTASONE) 20 MG tablet        05/28/22 1247    Ambulatory referral to Neurology       Comments: An appointment is requested in approximately: 2 weeks   05/28/22 1247              Carlisle Cater, Hershal Coria 05/28/22 1308    Lacretia Leigh, MD 05/29/22 [phone]

## 2022-05-28 NOTE — ED Triage Notes (Signed)
Pt states she has had numbness in her rt hand arm and when she turns her head it is like a shock going down her arm/hand. Chest discomfort which is not new.   Pt has MS

## 2022-05-31 ENCOUNTER — Encounter: Payer: Self-pay | Admitting: Neurology

## 2022-05-31 ENCOUNTER — Ambulatory Visit (INDEPENDENT_AMBULATORY_CARE_PROVIDER_SITE_OTHER): Payer: Medicaid Other | Admitting: Neurology

## 2022-05-31 ENCOUNTER — Encounter: Payer: Self-pay | Admitting: *Deleted

## 2022-05-31 VITALS — BP 137/82 | HR 104 | Ht 68.0 in | Wt 220.0 lb

## 2022-05-31 DIAGNOSIS — G43709 Chronic migraine without aura, not intractable, without status migrainosus: Secondary | ICD-10-CM | POA: Diagnosis not present

## 2022-05-31 DIAGNOSIS — R200 Anesthesia of skin: Secondary | ICD-10-CM

## 2022-05-31 DIAGNOSIS — G35 Multiple sclerosis: Secondary | ICD-10-CM | POA: Diagnosis not present

## 2022-05-31 DIAGNOSIS — R202 Paresthesia of skin: Secondary | ICD-10-CM | POA: Diagnosis not present

## 2022-05-31 DIAGNOSIS — Z79899 Other long term (current) drug therapy: Secondary | ICD-10-CM | POA: Diagnosis not present

## 2022-05-31 MED ORDER — GABAPENTIN 300 MG PO CAPS: 300.0000 mg | ORAL_CAPSULE | Freq: Three times a day (TID) | ORAL | 11 refills | Status: DC

## 2022-05-31 NOTE — Progress Notes (Signed)
[facility]  PATIENT: [name] DOB: 1975-06-24  REFERRING DOCTOR OR PCP: Roland Rack, MD SOURCE: Patient, notes from recent hospital admission, imaging and laboratory reports, MRI images personally reviewed.  _________________________________   HISTORICAL  CHIEF COMPLAINT:  Chief Complaint  Patient presents with   Room 2    Pt is here Alone. Pt states that her hand is numb. Pt states that she has a shocking pain from her neck down on her right side. Pt states that she has Fatigue.     HISTORY OF PRESENT ILLNESS:  [name] is a 47 y.o. woman with relapsing remitting multiple sclerosis.  Update 05/31/2022: She was going to switch from glatiramer to [facility] but changed her mind due to perceptions of s.e.Marland Kitchen  She has more numbness.   In September it was just the right hand and now the whole arm.    MRI cervical spine 02/2022 shows a focus at [place] not clearly present on previous c-spine MRi  She went to the ED 05/28/22 due to numbness in the right hand.   This started out as more numbness in September but has become painful.  She has allodynia.  Gabapentin 100 g 3 times daily was started and she worked up to 300 mg twice daily for just a couple days and then stopped because it was not helping..  She saw Ortho and had an EMG that did not show any CTS.   She is supposed to see a spine orthopedist soon.      She has had some right hand numbness a while and MRI 02/2022 showed unchanged foci at [place] and a posterolateral focus to the right at [place] (that was not present on earlier MRI that year.       Gait is ok - oc mild reduced balance  She uses the rail on stairs.  She has numbness and dysesthesia in her right hand and sometimes right leg if she turns her head quickly.   She denies weakness or clumsiness.   She will somtimes feel dizzy, especially if she bends forward.    Vision is more blurry   Bladder function is fine.    She notes fatigue.    She has poor sleep taking a nap x 2 hours in the afternoon due to poor sleep at night.  Sleep helps her headache  SHe denies neck pain but has LBP and left arm pain.    She gets migraine headaches on a daily basis (30/30 days) > 4 hours a day. . Sumatriptan only helped a few times.   Fioricet sometimes helps.   Pain can be occipital or temporal.  .   She denies nausea or photophobia.   She hears a cracking sound in her neck before the HA starts.     Sleep sometimes helps the headaches  Medications tried:  NSAID (meloxicam), antiepilepic (zonisamide), triptan (sumatriptan  CTA chest 10/16/2021 showed a "Pulmonary nodules in the left upper lobe (in 3 locations) and in 2 locations in the left lower lobe. The largest is a mass in the left lower lobe measuring 3.4 cm, previously 2.8 cm by PET CT comparison. Milder nodularity in the right lung including a 4-5 mm nodule in the right lower lobe on 7:82."      He was going to have a follow-up PET scan.   She never had this done as insurance issues.  I advised her to follow-up with either pulmonary or her primary care about this  further  MS history: She began experiencing right-sided paresthesias in the right arm and leg in November 2021.  Symptoms would come and go.  She had no weakness or gait difficulty.    She presented to the emergency room.  MRI of the brain showed some white matter foci.  This was followed up with an MRI of the December cervical spine that showed 2 foci, one centrally at C2-C3 and 1 posteriorly to the right at C5-C6.   She continues to have episodes of random numbness with some tingling.    Sometimes she has symptoms shooting down her spine.     Imaging:  MRI of the cervical spine 02/2022 showed foci at C2 and at C5-C6.  An additional focus at C4-C5 posterolaterally to the right was seen on that MRI but not on previous MRIs of the cervical spine.  MRI of the brain 10/16/2021 showed no new lesion. She has multiple T2/FLAIR foci in the  hemispheres and pons and midbrain c/w chronic MS lesions.   She has a 42m perifalcine meningioma.     MRI of the cervical spine 10/16/2021 showed MS lesions (unchanged) at C2 and C5=C6.    And mild spondylosis.    MRI thoracic spine 10/16/2021 showed chronic MS lesions at T4 and T9T10 (no comparison films) and a left lower pulmonary mass.   CTA chest 10/16/2021 showed a "Pulmonary nodules in the left upper lobe (in 3 locations) and in 2 locations in the left lower lobe. The largest is a mass in the left lower lobe measuring 3.4 cm, previously 2.8 cm by PET CT comparison. Milder nodularity in the right lung including a 4-5 mm nodule in the right lower lobe on 7:82."       MRI of the brain 04/11/2020 showed multiple T2/FLAIR hyperintense foci in the periventricular, juxtacortical and deep white matter.  One focus in the right parietal lobe had subtle enhancement..  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  MRI of the cervical spine 04/13/2020 showed T2 hyperintense foci centrally adjacent to C2-C3, posteriorly to the right adjacent to C5-C6.  He had mild disc bulges and reversal of the cervical curvature.  Vitamin D is low at 13.  ANA, SSA/SSB are normal/negative.  SARS COVID 2 PCR was negative  Family history: Her daughter was diagnosed with MS diagnosed in November 2021 (presented with diplopia).  She is on [place]en[place].   She also has a first cousin with MS.      REVIEW OF SYSTEMS: Constitutional: No fevers, chills, sweats, or change in appetite Eyes: No visual changes, double vision, eye pain Ear, nose and throat: No hearing loss, ear pain, nasal congestion, sore throat Cardiovascular: No chest pain, palpitations Respiratory:  No shortness of breath at rest or with exertion.   No wheezes GastrointestinaI: No nausea, vomiting, diarrhea, abdominal pain, fecal incontinence Genitourinary:  No dysuria, urinary retention or frequency.  No nocturia. Musculoskeletal:  No  neck pain, back pain Integumentary: No rash, pruritus, skin lesions Neurological: as above Psychiatric: No depression at this time.  No anxiety Endocrine: No palpitations, diaphoresis, change in appetite, change in weigh or increased thirst Hematologic/Lymphatic:  No anemia, purpura, petechiae. Allergic/Immunologic: No itchy/runny eyes, nasal congestion, recent allergic reactions, rashes  ALLERGIES: Allergies  Allergen Reactions   Naproxen    Pork-Derived Products     Religous beliefs. Ok with lovenox.   Latex Rash    HOME MEDICATIONS:  Current Outpatient Medications:    Aspirin-Caffeine (  BAYER BACK & BODY PO), Take 1 tablet by mouth every 6 (six) hours as needed (back pain)., Disp: , Rfl:    COPAXONE 40 MG/ML SOSY, Inject 40 mg into the skin 3 (three) times a week. Brand name-Copaxone, Disp: 36 mL, Rfl: 3   gabapentin (NEURONTIN) 300 MG capsule, Take 1 capsule (300 mg total) by mouth 3 (three) times daily., Disp: 90 capsule, Rfl: 11   imipramine (TOFRANIL) 25 MG tablet, TAKE 1 TABLET BY MOUTH EVERYDAY AT BEDTIME, Disp: 90 tablet, Rfl: 1   predniSONE (DELTASONE) 20 MG tablet, 3 Tabs PO Days 1-3, then 2 tabs PO Days 4-6, then 1 tab PO Day 7-9, then Half Tab PO Day 10-12, Disp: 20 tablet, Rfl: 0   naproxen (NAPROSYN) 500 MG tablet, Take 1 tablet (500 mg total) by mouth 2 (two) times daily. (Patient not taking: Reported on 05/31/2022), Disp: 30 tablet, Rfl: 0   SUMAtriptan (IMITREX) 100 MG tablet, Take 1 tablet (100 mg total) by mouth once as needed for up to 1 dose for migraine. May repeat in 2 hours if headache persists or recurs. (Patient not taking: Reported on 05/31/2022), Disp: 10 tablet, Rfl: 11  PAST MEDICAL HISTORY: Past Medical History:  Diagnosis Date   Anxiety    BMI 30.0-30.9,adult    Depression    Multiple sclerosis ([place])    Uterine fibroid     PAST SURGICAL HISTORY: Past Surgical History:  Procedure Laterality Date   ABDOMINAL HYSTERECTOMY     2010   CESAREAN  SECTION     x 2    FAMILY HISTORY: Family History  Problem Relation Age of Onset   Cancer Mother    HIV Father    Multiple sclerosis Daughter    Multiple sclerosis Cousin     SOCIAL HISTORY:  Social History   Socioeconomic History   Marital status: Single    Spouse name: Not on file   Number of children: Not on file   Years of education: Not on file   Highest education level: Not on file  Occupational History   Not on file  Tobacco Use   Smoking status: Never   Smokeless tobacco: Never  Vaping Use   Vaping Use: Never used  Substance and Sexual Activity   Alcohol use: No   Drug use: No   Sexual activity: Not on file  Other Topics Concern   Not on file  Social History Narrative   Right handed   1 cup coffee per day   Social Determinants of Health   Financial Resource Strain: Not on file  Food Insecurity: Not on file  Transportation Needs: Not on file  Physical Activity: Not on file  Stress: Not on file  Social Connections: Not on file  Intimate Partner Violence: Not on file     PHYSICAL EXAM  Vitals:   05/31/22 1315  BP: 137/82  Pulse: (!) 104  Weight: 220 lb (99.8 kg)  Height: '5\' 8"'$  (1.727 m)    Body mass index is 33.45 kg/m.   General: The patient is well-developed and well-nourished and in no acute distress  HEENT:  Head is [place]/AT.  Sclera are anicteric.   Skin: Extremities are without rash or  edema.  Neurologic Exam  Mental status: The patient is alert and oriented x 3 at the time of the examination. The patient has apparent normal recent and remote memory, with an apparently normal attention span and concentration ability.   Speech is normal.  Cranial nerves: Extraocular movements  are full.  Facial strength and sensation was normal.. No obvious hearing deficits are noted.  Motor:  Muscle bulk is normal.   Tone is normal. Strength is  5 / 5 in all 4 extremities.   Sensory: Sensory testing is intact to pinprick, soft touch and vibration  sensation in all 4 extremities.  Coordination: Cerebellar testing reveals good finger-nose-finger and heel-to-shin bilaterally.  Gait and station: Station is normal.  Gait is normal.  Tandem gait is mildly wide.. Romberg is negative.   Reflexes: Deep tendon reflexes are symmetric and normal bilaterally.  Plantar responses flexor.   DIAGNOSTIC DATA (LABS, IMAGING, TESTING) - I reviewed patient records, labs, notes, testing and imaging myself where available.  Lab Results  Component Value Date   WBC 5.5 02/08/2022   HGB 11.6 (L) 02/08/2022   HCT 39.1 02/08/2022   MCV 73.5 (L) 02/08/2022   PLT 362 02/08/2022      Component Value Date/Time   NA 139 02/08/2022 1616   NA 141 12/07/2021 1045   K 3.7 02/08/2022 1616   CL 107 02/08/2022 1616   CO2 28 02/08/2022 1616   GLUCOSE 100 (H) 02/08/2022 1616   BUN 9 02/08/2022 1616   BUN 9 12/07/2021 1045   CREATININE 0.63 02/08/2022 1616   CALCIUM 9.0 02/08/2022 1616   PROT 6.9 12/07/2021 1045   ALBUMIN 4.1 12/07/2021 1045   AST 12 12/07/2021 1045   ALT 13 12/07/2021 1045   ALKPHOS 65 12/07/2021 1045   BILITOT 0.2 12/07/2021 1045   GFRNONAA >60 02/08/2022 1616   GFRAA >60 07/13/2017 0339   Lab Results  Component Value Date   CHOL 118 04/11/2020   HDL 37 (L) 04/11/2020   LDLCALC 72 04/11/2020   TRIG 43 04/11/2020   CHOLHDL 3.2 04/11/2020   Lab Results  Component Value Date   HGBA1C 5.9 (H) 04/11/2020   Lab Results  Component Value Date   VITAMINB12 1,089 (H) 02/08/2022   No results found for: "TSH"     ASSESSMENT AND PLAN  Multiple sclerosis (HCC)  Chronic migraine w/o aura w/o status migrainosus, not intractable  High risk medication use  Numbness and tingling of right arm and leg   We have tried to switch her from Copaxone to [place] last year but she opted to stay on the Copaxone.  I discussed that with the new lesion in the spinal cord I highly recommend that she switch from Copaxone to a stronger  medication.  She is willing to switch to Kesimpta and we will try to get this approved for her. I will send in a prescription for gabapentin 300 mg 3 times daily and advised her to add an additional pill at night if she tolerates it.  If after a few weeks the gabapentin is not helping we can try lamotrigine and titrate initially to 50 mg twice a day and then higher based on response Continue Emgality for chronic migraine  she had a CT scan with pulmonary abnormality and sore pulmonary in August 2023.Marland Kitchen  She was going to have additional studies but due to insurance issues this was not done.  I advised her to follow-up with her primary care or lung doctor about this further. Return in 6 months or sooner if there are new or worsening neurologic symptoms.  45-minute office visit with the majority of the time spent face-to-face for history and physical, discussion/counseling and decision-making.  Additional time with record review and documentation.   Katasha Riga A. Felecia Shelling, MD, [facility] 05/31/2022,  5:37 PM Certified in Neurology, Clinical Neurophysiology, Sleep Medicine and Neuroimaging  [facility] [address], [place], [place] [address] [phone]

## 2022-06-01 NOTE — Telephone Encounter (Signed)
Faxed completed/signed Kesimpta start form to [facility] at [phone]. Phone: [phone]. Received fax confirmation.

## 2022-06-02 ENCOUNTER — Encounter: Payer: Self-pay | Admitting: Neurology

## 2022-06-02 DIAGNOSIS — M542 Cervicalgia: Secondary | ICD-10-CM | POA: Diagnosis not present

## 2022-06-03 ENCOUNTER — Telehealth: Payer: Self-pay | Admitting: Neurology

## 2022-06-03 DIAGNOSIS — L0292 Furuncle, unspecified: Secondary | ICD-10-CM | POA: Diagnosis not present

## 2022-06-03 DIAGNOSIS — R519 Headache, unspecified: Secondary | ICD-10-CM | POA: Diagnosis not present

## 2022-06-03 NOTE — Telephone Encounter (Signed)
Pt having right arm is numb when got to work, now left arm also numb, wrapping around the chest. Informed pt to go to the emergency room. Pt asking to speak with the on call physician.

## 2022-06-06 ENCOUNTER — Other Ambulatory Visit: Payer: Self-pay | Admitting: *Deleted

## 2022-06-06 DIAGNOSIS — G35 Multiple sclerosis: Secondary | ICD-10-CM

## 2022-06-06 MED ORDER — NURTEC 75 MG PO TBDP: 1.0000 | ORAL_TABLET | ORAL | 5 refills | Status: DC | PRN

## 2022-06-06 MED ORDER — GLATIRAMER ACETATE 40 MG/ML ~~LOC~~ SOSY: 40.0000 mg | PREFILLED_SYRINGE | SUBCUTANEOUS | 0 refills | Status: DC

## 2022-06-06 NOTE — Telephone Encounter (Signed)
I called [facility] at [phone]. Spoke w/ pharmacist, Davy Pique. Provided VO to dispense whisperject auto injector.  They will get rx ready for shipment. They will call pt.

## 2022-06-09 ENCOUNTER — Encounter: Payer: Self-pay | Admitting: Neurology

## 2022-06-09 NOTE — Telephone Encounter (Signed)
PA Nurtec submitted on covermymeds. Key: [id number]. Waiting on determination from [place] Medicaid.   PA Emgality submitted on covermymeds. Key: [id number]. Waiting on determination from [place] Medicaid.

## 2022-06-13 ENCOUNTER — Telehealth: Payer: Self-pay | Admitting: Neurology

## 2022-06-13 NOTE — Telephone Encounter (Signed)
PA Nurtec submitted via updated insurance on covermymeds. Key: [id number]. Waiting on determination from [place] Emgality submitted on covermymeds. Key: [id number]. Waiting on determination from [facility] commerical.

## 2022-06-13 NOTE — Telephone Encounter (Signed)
Submitted PA Kesimpta on covermymeds. Key: [id number]. Waiting on determination from [place].

## 2022-06-13 NOTE — Telephone Encounter (Signed)
PA approved effective from 06/13/2022 through 06/12/2023. Starter kit included in authorization.

## 2022-06-13 NOTE — Telephone Encounter (Signed)
Called back and spoke w/ pharmacist, Tarry Kos. Advised pt switching from glatiramer to [place].  Hep B labs completed 12/07/2021. Relayed results. He verbalized understanding. They will ship out Kesimpta to pt tomorrow.

## 2022-06-13 NOTE — Telephone Encounter (Signed)
Dr. Felecia Shelling- are you ok with calling in Ubrelvy instead of Nurtec? Pt must try/fail this first before Nurtec covered.    PA Emgality approved effective from 06/13/2022 through 09/04/2022.  PA Nurtec denied.

## 2022-06-13 NOTE — Telephone Encounter (Signed)
[name] is calling from [place]. Stated they will need a PV for both doses of Kesimpta.( [place] Department  [phone])

## 2022-06-13 NOTE — Telephone Encounter (Signed)
PA pending for Kesimpta. PA asked if starter dose would be needed. Key: [id number].

## 2022-06-13 NOTE — Telephone Encounter (Signed)
[facility]) requesting Hepatitis B results for Kesimpta. Would like a call from nurse as soon as possible, trying to ship out today.

## 2022-06-14 ENCOUNTER — Other Ambulatory Visit: Payer: Self-pay | Admitting: *Deleted

## 2022-06-14 ENCOUNTER — Other Ambulatory Visit (HOSPITAL_COMMUNITY): Payer: Self-pay

## 2022-06-14 DIAGNOSIS — G43709 Chronic migraine without aura, not intractable, without status migrainosus: Secondary | ICD-10-CM

## 2022-06-14 DIAGNOSIS — G35 Multiple sclerosis: Secondary | ICD-10-CM

## 2022-06-14 MED ORDER — KESIMPTA 20 MG/0.4ML ~~LOC~~ SOAJ
0.4000 mL | SUBCUTANEOUS | 11 refills | Status: AC
  Filled 2022-06-14: qty 0.4, fill #0

## 2022-06-14 MED ORDER — UBRELVY 100 MG PO TABS: 1.0000 | ORAL_TABLET | ORAL | 5 refills | Status: DC | PRN

## 2022-06-14 MED ORDER — KESIMPTA 20 MG/0.4ML ~~LOC~~ SOAJ
SUBCUTANEOUS | 0 refills | Status: DC
  Filled 2022-06-14: qty 1.2, 30d supply, fill #0

## 2022-06-14 MED ORDER — KESIMPTA 20 MG/0.4ML ~~LOC~~ SOAJ
20.0000 mg | SUBCUTANEOUS | 6 refills | Status: DC
  Filled 2022-06-14: qty 0.4, fill #0

## 2022-06-14 NOTE — Telephone Encounter (Signed)
Called pharmacy and spoke with [place]. She requested we send two separate rx's for loading dose and maintenance rx. I e-scribed. Also included PA approval info on rx: "PA approved effective from 06/13/2022 through 06/12/2023. Starter kit included in authorization."

## 2022-06-14 NOTE — Telephone Encounter (Signed)
Jeanmarie Plant is calling from [place]. Stated she needs to talk to nurse about prescription that was writing for [employer]

## 2022-06-14 NOTE — Telephone Encounter (Signed)
PA Ubrelvy submitted on covermymeds. Key: [id number]. Received instant approval effective from 06/14/2022 through 09/05/2022.

## 2022-06-14 NOTE — Telephone Encounter (Signed)
Received notification from [place] that [facility] is the required specialty pharmacy to use for [place].

## 2022-06-14 NOTE — Addendum Note (Signed)
Addended by: Lester Ribera A on: 06/14/2022 11:16 AM   Modules accepted: Orders

## 2022-06-15 ENCOUNTER — Other Ambulatory Visit: Payer: Self-pay

## 2022-06-15 ENCOUNTER — Other Ambulatory Visit (HOSPITAL_COMMUNITY): Payer: Self-pay

## 2022-06-16 ENCOUNTER — Encounter: Payer: Self-pay | Admitting: Neurology

## 2022-06-16 ENCOUNTER — Other Ambulatory Visit: Payer: Self-pay | Admitting: Neurology

## 2022-06-16 ENCOUNTER — Other Ambulatory Visit (HOSPITAL_COMMUNITY): Payer: Self-pay

## 2022-06-16 MED ORDER — GABAPENTIN 800 MG PO TABS: 800.0000 mg | ORAL_TABLET | Freq: Three times a day (TID) | ORAL | 3 refills | Status: AC

## 2022-06-17 ENCOUNTER — Other Ambulatory Visit (HOSPITAL_COMMUNITY): Payer: Self-pay

## 2022-06-20 ENCOUNTER — Other Ambulatory Visit: Payer: Self-pay

## 2022-06-22 ENCOUNTER — Encounter: Payer: Self-pay | Admitting: Neurology

## 2022-06-24 ENCOUNTER — Other Ambulatory Visit: Payer: Self-pay

## 2022-06-24 ENCOUNTER — Other Ambulatory Visit: Payer: Self-pay | Admitting: Neurology

## 2022-06-24 MED ORDER — LAMOTRIGINE 25 MG PO TABS: ORAL_TABLET | ORAL | 5 refills | Status: DC

## 2022-06-27 ENCOUNTER — Encounter: Payer: Self-pay | Admitting: Neurology

## 2022-06-28 ENCOUNTER — Telehealth: Payer: Self-pay

## 2022-06-28 ENCOUNTER — Other Ambulatory Visit: Payer: Self-pay

## 2022-06-28 NOTE — Telephone Encounter (Signed)
Please see message from today 06/28/2022.

## 2022-06-29 ENCOUNTER — Telehealth: Payer: Self-pay | Admitting: *Deleted

## 2022-06-29 NOTE — Telephone Encounter (Signed)
Submitted PA Ubrelvy on covermymeds. Key: [id number]. Waiting on determination from [place] Medicaid.

## 2022-06-29 NOTE — Telephone Encounter (Signed)
Received message back from [employer] that they do not handle PA's for pt.   Started new PA request on CMM via [place]. ID: [id number], GRP: [id number]. KeySU:[id number]. Waiting on determination from [place].

## 2022-06-29 NOTE — Telephone Encounter (Signed)
PA approved until 09/21/2022.

## 2022-06-30 ENCOUNTER — Telehealth: Payer: Self-pay

## 2022-07-05 ENCOUNTER — Other Ambulatory Visit (HOSPITAL_COMMUNITY): Payer: Self-pay

## 2022-07-07 ENCOUNTER — Other Ambulatory Visit (HOSPITAL_COMMUNITY): Payer: Self-pay

## 2022-07-15 ENCOUNTER — Emergency Department (HOSPITAL_COMMUNITY): Payer: BLUE CROSS/BLUE SHIELD

## 2022-07-15 ENCOUNTER — Emergency Department (HOSPITAL_COMMUNITY)
Admission: EM | Admit: 2022-07-15 | Discharge: 2022-07-15 | Disposition: A | Discharge status: Home / Self Care | Payer: BLUE CROSS/BLUE SHIELD | Attending: Emergency Medicine | Admitting: Emergency Medicine

## 2022-07-15 DIAGNOSIS — M542 Cervicalgia: Secondary | ICD-10-CM | POA: Diagnosis not present

## 2022-07-15 DIAGNOSIS — R079 Chest pain, unspecified: Secondary | ICD-10-CM | POA: Insufficient documentation

## 2022-07-15 DIAGNOSIS — M79601 Pain in right arm: Secondary | ICD-10-CM | POA: Insufficient documentation

## 2022-07-15 DIAGNOSIS — Z91040 Latex allergy status: Secondary | ICD-10-CM | POA: Diagnosis not present

## 2022-07-15 DIAGNOSIS — Z7982 Long term (current) use of aspirin: Secondary | ICD-10-CM | POA: Diagnosis not present

## 2022-07-15 DIAGNOSIS — G248 Other dystonia: Secondary | ICD-10-CM | POA: Insufficient documentation

## 2022-07-15 LAB — COMPREHENSIVE METABOLIC PANEL
ALT: 17 U/L (ref 0–44)
AST: 19 U/L (ref 15–41)
Albumin: 3.2 g/dL — ABNORMAL LOW (ref 3.5–5.0)
Alkaline Phosphatase: 57 U/L (ref 38–126)
Anion gap: 10 (ref 5–15)
BUN: <5 mg/dL — ABNORMAL LOW (ref 6–20)
CO2: 24 mmol/L (ref 22–32)
Calcium: 8.8 mg/dL — ABNORMAL LOW (ref 8.9–10.3)
Chloride: 103 mmol/L (ref 98–111)
Creatinine, Ser: 0.71 mg/dL (ref 0.44–1.00)
GFR, Estimated: >60 mL/min (ref >60)
Glucose, Bld: 90 mg/dL (ref 70–99)
Potassium: 3.8 mmol/L (ref 3.5–5.1)
Sodium: 137 mmol/L (ref 135–145)
Total Bilirubin: 0.2 mg/dL — ABNORMAL LOW (ref 0.3–1.2)
Total Protein: 6.7 g/dL (ref 6.5–8.1)

## 2022-07-15 LAB — CBC WITH DIFFERENTIAL/PLATELET
Abs Immature Granulocytes: 0.01 K/uL (ref 0.00–0.07)
Basophils Absolute: 0 K/uL (ref 0.0–0.1)
Basophils Relative: 0 %
Eosinophils Absolute: 0.4 K/uL (ref 0.0–0.5)
Eosinophils Relative: 8 %
HCT: 38.8 % (ref 36.0–46.0)
Hemoglobin: 11.3 g/dL — ABNORMAL LOW (ref 12.0–15.0)
Immature Granulocytes: 0 %
Lymphocytes Relative: 24 %
Lymphs Abs: 1.2 K/uL (ref 0.7–4.0)
MCH: 21.5 pg — ABNORMAL LOW (ref 26.0–34.0)
MCHC: 29.1 g/dL — ABNORMAL LOW (ref 30.0–36.0)
MCV: 73.8 fL — ABNORMAL LOW (ref 80.0–100.0)
Monocytes Absolute: 0.3 K/uL (ref 0.1–1.0)
Monocytes Relative: 6 %
Neutro Abs: 3 K/uL (ref 1.7–7.7)
Neutrophils Relative %: 62 %
Platelets: 313 K/uL (ref 150–400)
RBC: 5.26 MIL/uL — ABNORMAL HIGH (ref 3.87–5.11)
RDW: 15 % (ref 11.5–15.5)
WBC: 4.9 K/uL (ref 4.0–10.5)
nRBC: 0 % (ref 0.0–0.2)

## 2022-07-15 LAB — TROPONIN I (HIGH SENSITIVITY)
Troponin I (High Sensitivity): 3 ng/L (ref <18)
Troponin I (High Sensitivity): 4 ng/L (ref <18)

## 2022-07-15 MED ORDER — PREDNISONE 10 MG (21) PO TBPK: ORAL_TABLET | Freq: Every day | ORAL | 0 refills | Status: DC

## 2022-07-15 MED ORDER — GADOBUTROL 1 MMOL/ML IV SOLN
10.0000 mL | Freq: Once | INTRAVENOUS | Status: AC | PRN
  Administered 2022-07-15: 10 mL via INTRAVENOUS

## 2022-07-15 NOTE — Discharge Instructions (Addendum)
Noted your MRI remains unchanged from prior MRIs.  As discussed, neurology is here recommends referral for [facility] movement disorder clinic.  As discussed we will place you on prednisone for the next few days.  If you experience that symptoms again, try to capture video of your affected arm to send to [facility] neurologist; please call them at your earliest convenience to make an appointment.  Please not hesitate to return to emergency department for worrisome signs and symptoms we discussed become apparent.  [facility] [address], [place], [place] [address] [phone]

## 2022-07-15 NOTE — ED Provider Notes (Signed)
[facility] Provider Note   CSN: [id number] Arrival date & time: 07/15/22  1241     History  Chief Complaint  Patient presents with   Chest Pain    [name] is a 47 y.o. female.   Chest Pain   47 year old female presents emergency department with complaints of neck pain and right arm pain/numbness.  Patient reports she was working from home earlier today when she was sitting and noticed abrupt onset right-sided shocking like sensation from right neck down to right fingertips.  Reports she had forced flexion of her right arm was clinical like appearance of right hand, which was apparent for 5 to 10 minutes and patient was unable to control it.  Patient reports baseline of shocking like sensation in similar distribution but states "this episode was severe."  Reports worsening of symptoms with movement of her right shoulder as well as rotating her right neck towards affected arm as well as in flexion.  Patient followed with neurology, neurosurgery as well as orthopedics for workup.  Patient has baseline numbness in right hand.  States that she felt numbness her "entire right arm" during episode of which has persisted.  Patient also states that during episode, she felt chest tightness as well as shortness of breath of which has improved since then.  Residual symptoms include shocking like sensation of which has improved as well as right upper extremity numbness.  Denies visual deficits, slurred speech, facial droop, other weakness/sensory deficits, gait abnormalities.  Denies fever, chills, night sweats, abdominal pain, nausea, vomiting, urinary symptoms, change in bowel habits, cough, congestion.   Patient states that she has been without her MS medications for the past 2 weeks prior to Monday or Tuesday of this week when she began taking them again.  States she has upcoming appointment with neurosurgery for cervical spine injection.   Past  medical history significant for multiple sclerosis, anxiety, depression  Home Medications Prior to Admission medications   Medication Sig Start Date End Date Taking? Authorizing Provider  gabapentin (NEURONTIN) 800 MG tablet Take 1 tablet (800 mg total) by mouth 3 (three) times daily. 06/16/22  Yes Sater, Nanine Means, MD  predniSONE (STERAPRED UNI-PAK 21 TAB) 10 MG (21) TBPK tablet Take by mouth daily. Take 6 tabs by mouth daily  for 2 days, then 5 tabs for 2 days, then 4 tabs for 2 days, then 3 tabs for 2 days, 2 tabs for 2 days, then 1 tab by mouth daily for 2 days 07/15/22  Yes Unk Lightning, Ha Placeres A, PA  UBRELVY 100 MG TABS Take 1 tablet (100 mg total) by mouth as needed. Can take one additional dose at least 2 hours after the initial dose if needed. Max daily dose is 200 mg total. 06/14/22  Yes Sater, Nanine Means, MD  Aspirin-Caffeine (BAYER BACK & BODY PO) Take 1 tablet by mouth every 6 (six) hours as needed (back pain).    [provider]  EMGALITY 120 MG/ML SOSY Inject 1 mL into the skin every 30 (thirty) days. 01/20/22   [provider]  imipramine (TOFRANIL) 25 MG tablet TAKE 1 TABLET BY MOUTH EVERYDAY AT BEDTIME 12/30/21   Sater, Richard A, MD  KESIMPTA 20 MG/0.4ML SOAJ Inject '20mg'$  SQ at week 0, 1, & 2. Then start maintenance dose at week 4 06/14/22   Sater, Nanine Means, MD  KESIMPTA 20 MG/0.4ML SOAJ Inject 0.4 mLs into the skin every 30 (thirty) days. 06/14/22  Sater, Nanine Means, MD  lamoTRIgine (LAMICTAL) 25 MG tablet Take one pill daily x 3 days, then one pill twice daily x 3 days, then one pill three times daily x 3 days, then 2 pills twice daily. 06/24/22   Sater, Nanine Means, MD  naproxen (NAPROSYN) 500 MG tablet Take 1 tablet (500 mg total) by mouth 2 (two) times daily. Patient not taking: Reported on 05/31/2022 02/09/22   Sponseller, Gypsy Balsam, PA-C  SUMAtriptan (IMITREX) 100 MG tablet Take 1 tablet (100 mg total) by mouth once as needed for up to 1 dose for migraine. May repeat in 2 hours  if headache persists or recurs. Patient not taking: Reported on 05/31/2022 05/19/21   Debbora Presto, NP      Allergies    Naproxen, Pork-derived products, and Latex    Review of Systems   Review of Systems  Cardiovascular:  Positive for chest pain.  All other systems reviewed and are negative.   Physical Exam Updated Vital Signs BP 128/64 (BP Location: Right Arm)   Pulse 83   Temp 97.8 F (36.6 C) (Oral)   Resp 20   Ht '5\' 8"'$  (1.727 m)   Wt 99.8 kg   SpO2 100%   BMI 33.45 kg/m  Physical Exam Vitals and nursing note reviewed.  Constitutional:      General: She is not in acute distress.    Appearance: She is well-developed.  HENT:     Head: Normocephalic and atraumatic.     Right Ear: Tympanic membrane normal.     Left Ear: Tympanic membrane normal.  Eyes:     Conjunctiva/sclera: Conjunctivae normal.  Cardiovascular:     Rate and Rhythm: Normal rate and regular rhythm.     Heart sounds: No murmur heard. Pulmonary:     Effort: Pulmonary effort is normal. No respiratory distress.     Breath sounds: Normal breath sounds. No wheezing, rhonchi or rales.  Abdominal:     Palpations: Abdomen is soft.     Tenderness: There is no abdominal tenderness. There is no right CVA tenderness, left CVA tenderness, guarding or rebound.  Musculoskeletal:        General: No swelling.     Cervical back: Neck supple.     Right lower leg: No edema.     Left lower leg: No edema.  Skin:    General: Skin is warm and dry.     Capillary Refill: Capillary refill takes less than 2 seconds.  Neurological:     Mental Status: She is alert.     Comments: Alert and oriented to self, place, time and event.   Speech is fluent, clear without dysarthria or dysphasia.   Strength 5/5 in upper/lower extremities   Sensation intact in upper/lower extremities besides deficit in sensation to light touch above right upper extremity.  Normal gait.  Patient ambulated in room without difficulty. Negative Romberg.  No pronator drift.  Normal finger-to-nose and feet tapping.  CN I not tested  CN II not tested  CN III, IV, VI PERRLA and EOMs intact bilaterally  CN V Intact sensation to sharp and light touch to the face  CN VII facial movements symmetric  CN VIII not tested  CN IX, X no uvula deviation, symmetric rise of soft palate  CN XI 5/5 SCM and trapezius strength bilaterally  CN XII Midline tongue protrusion, symmetric L/R movements     Psychiatric:        Mood and Affect: Mood normal.  ED Results / Procedures / Treatments   Labs (all labs ordered are listed, but only abnormal results are displayed) Labs Reviewed  COMPREHENSIVE METABOLIC PANEL - Abnormal; Notable for the following components:      Result Value   BUN <5 (*)    Calcium 8.8 (*)    Albumin 3.2 (*)    Total Bilirubin 0.2 (*)    All other components within normal limits  CBC WITH DIFFERENTIAL/PLATELET - Abnormal; Notable for the following components:   RBC 5.26 (*)    Hemoglobin 11.3 (*)    MCV 73.8 (*)    MCH 21.5 (*)    MCHC 29.1 (*)    All other components within normal limits  TROPONIN I (HIGH SENSITIVITY)  TROPONIN I (HIGH SENSITIVITY)    EKG EKG Interpretation  Date/Time:  Friday July 15 2022 13:08:29 EST Ventricular Rate:  79 PR Interval:  168 QRS Duration: 82 QT Interval:  381 QTC Calculation: 437 R Axis:   64 Text Interpretation: Sinus rhythm Confirmed by Cindee Lame [phone]) on 07/15/2022 1:48:33 PM  Radiology MR Brain W and Wo Contrast  Result Date: 07/15/2022 CLINICAL DATA:  Neuro deficit, acute, stroke suspected. Right neck pain radiating to the arm. History of multiple sclerosis. EXAM: MRI HEAD WITHOUT AND WITH CONTRAST TECHNIQUE: Multiplanar, multiecho pulse sequences of the brain and surrounding structures were obtained without and with intravenous contrast. CONTRAST:  17m GADAVIST GADOBUTROL 1 MMOL/ML IV SOLN COMPARISON:  Head MRI 02/08/2022 FINDINGS: Brain: There is no evidence of an  acute infarct, intracranial hemorrhage, midline shift, or extra-axial fluid collection. The ventricles and sulci are normal. An 8 mm enhancing extra-axial mass along the right aspect of the falx/superior sagittal sinus at the vertex is unchanged, and there is no associated mass effect or brain edema. T2 hyperintensities in the cerebral white matter bilaterally, greatest in the periventricular regions, and in the right cerebral peduncle are unchanged, and none show enhancement. A partially empty sella is again noted. Vascular: Major intracranial vascular flow voids are preserved. Skull and upper cervical spine: Unremarkable bone marrow signal para Sinuses/Orbits: Unremarkable orbits. Mucous retention cyst in the right maxillary sinus. Trace right and small to moderate left mastoid effusions. Other: None. IMPRESSION: 1. No acute intracranial abnormality. 2. Unchanged white matter disease consistent with the history of multiple sclerosis. No evidence of active demyelination. 3. 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 Electronically Signed   By: ALogan BoresM.D.   On: 07/15/2022 17:47   DG Chest Port 1 View  Result Date: 07/15/2022 CLINICAL DATA:  Chest pain. EXAM: PORTABLE CHEST 1 VIEW COMPARISON:  CT chest dated October 16, 2021. Chest x-ray dated Oct 02, 2021. FINDINGS: The heart size and mediastinal contours are within normal limits. Normal pulmonary vascularity. Mild bibasilar atelectasis. Left lower lobe mass has mildly increased in size, currently measuring 4.5 cm, previously 4.2 cm. No focal consolidation, pleural effusion, or pneumothorax. No acute osseous abnormality. IMPRESSION: 1. Mild interval increase in size of left lower lobe mass. 2. No acute cardiopulmonary disease. Electronically Signed   By: WTitus DubinM.D.   On: 07/15/2022 14:04    Procedures Procedures    Medications Ordered in ED Medications  gadobutrol (GADAVIST) 1 MMOL/ML injection 10 mL (10 mLs Intravenous Contrast  Given 07/15/22 1710)    ED Course/ Medical Decision Making/ A&P Clinical Course as of 07/15/22 1928  Fri Jul 15, 2022  1414 Case discussed with attending physician Dr. NMayra Neerregarding possible MRI cervical spine and  brain given history of MS.  To be assessed independently to determine further treatment course. [CR]  Ault Dr. Cheral Marker of neurology.  He recommended MRI of the brain with and without contrast.  If negative, referral to [facility] for further assessment.  Make sure patient takes a video if contracture happens again. [CR]    Clinical Course User Index [CR] Wilnette Kales, PA                             Medical Decision Making Amount and/or Complexity of Data Reviewed Labs: ordered. Radiology: ordered.  Risk Prescription drug management.   This patient presents to the ED for concern of right upper extremity pain/contracture, this involves an extensive number of treatment options, and is a complaint that carries with it a high risk of complications and morbidity.  The differential diagnosis includes MS flare, CVA, cerebral venous process, muscular spasm, cervical radiculopathy, malignancy spinal cord injury   Co morbidities that complicate the patient evaluation  See HPI   Additional history obtained:  Additional history obtained from EMR External records from outside source obtained and reviewed including hospital records   Lab Tests:  I Ordered, and personally interpreted labs.  The pertinent results include: No leukocytosis.  Evidence of anemia with a hemoglobin 11.3 of which is near patient's baseline from prior laboratory studies performed.  Platelets within normal range.  No electrolyte abnormalities besides mild hypocalcemia of 8.8.  No transaminitis.  No renal dysfunction.  Troponin of 3 with repeat 4; EKG concerning for sinus rhythm with nonspecific T wave inversion in V1 but otherwise unremarkable.   Imaging Studies  ordered:  I ordered imaging studies including chest x-ray, MR brain I independently visualized and interpreted imaging which showed  Chest x-ray: No acute cardiopulmonary process MRI brain: No acute intracranial abnormality.  Unchanged white matter disease consistent with history of MS.  No evidence of acute demyelination.  Unchanged 8 mm parafalcine meningioma at vertex. I agree with the radiologist interpretation  Cardiac Monitoring: / EKG:  The patient was maintained on a cardiac monitor.  I personally viewed and interpreted the cardiac monitored which showed an underlying rhythm of: sinus rhythm with nonspecific T wave inversion in V1 but otherwise unremarkable.   Consultations Obtained:  See ED course  Problem List / ED Course / Critical interventions / Medication management  Right upper extremity pain/dystonia of extremity Reevaluation of the patient showed that the patient stayed the same I have reviewed the patients home medicines and have made adjustments as needed   Social Determinants of Health:  Denies tobacco, illicit drug use.   Test / Admission - Considered:  Right upper extremity pain/dystonia of extremity Vitals signs within normal range and stable throughout visit. Laboratory/imaging studies significant for: See above Patient presents with pain of right upper extremity as well as dystonia of right upper extremity.  Given history of MS as well as brief episode of dystonic type episode, neurology was consulted who recommended MRI brain.  MRI of brain remain unchanged from prior MRIs performed with most recent being in October of this past year.  Recommendation was for follow-up outpatient with [facility].  Low suspicion for acute MS exacerbation, CVA, cerebral venous thrombosis, ischemic limb, compartment syndrome, acute fracture/dislocation.  Unaware of exact etiology of patient's dystonic episode earlier.  Follow-up information given outpatient for [facility] specialist.  In the meantime, patient placed on prednisone  taper.  Treatment plan discussed at length with patient and she acknowledged understanding was agreeable to said plan. Worrisome signs and symptoms were discussed with the patient, and the patient acknowledged understanding to return to the ED if noticed. Patient was stable upon discharge.          Final Clinical Impression(s) / ED Diagnoses Final diagnoses:  Pain in right arm  Dystonia of extremity    Rx / DC Orders ED Discharge Orders          Ordered    predniSONE (STERAPRED UNI-PAK 21 TAB) 10 MG (21) TBPK tablet  Daily        07/15/22 1848              Wilnette Kales, [place] 07/15/22 1929    Audley Hose, MD 07/19/22 1241

## 2022-07-15 NOTE — ED Triage Notes (Signed)
Pt BIB EMS from home (work from home) for sharp pain in right neck that radiates to the arm. Upon arrival she reports chest pain and tightness. HS MS. NAD noted, ABCs intact at this time.

## 2022-07-15 NOTE — ED Notes (Signed)
This RN reviewed discharge instructions with patient. She verbalized understanding and denied any further questions. Pt well appearing upon discharge and reports tolerable pain. Pt ambulated with stable gait to exit. Pt endorses ride home.

## 2022-07-26 ENCOUNTER — Other Ambulatory Visit (HOSPITAL_COMMUNITY): Payer: Self-pay

## 2022-07-28 ENCOUNTER — Other Ambulatory Visit (HOSPITAL_COMMUNITY): Payer: Self-pay

## 2022-08-01 ENCOUNTER — Other Ambulatory Visit (HOSPITAL_COMMUNITY): Payer: Self-pay

## 2022-08-09 DIAGNOSIS — L2084 Intrinsic (allergic) eczema: Secondary | ICD-10-CM | POA: Insufficient documentation

## 2022-08-09 NOTE — Progress Notes (Deleted)
Office Visit Note  Patient: [name]             Date of Birth: 20-Nov-1975           MRN: [id number]             PCP: Jola Baptist, PA-C Referring: Orene Desanctis, MD Visit Date: 08/23/2022 Occupation: @GUAROCC @  Subjective:  No chief complaint on file.   History of Present Illness: [name] is a 47 y.o. female ***     Activities of Daily Living:  Patient reports morning stiffness for *** {minute/hour:19697}.   Patient {ACTIONS;DENIES/REPORTS:21021675::"Denies"} nocturnal pain.  Difficulty dressing/grooming: {ACTIONS;DENIES/REPORTS:21021675::"Denies"} Difficulty climbing stairs: {ACTIONS;DENIES/REPORTS:21021675::"Denies"} Difficulty getting out of chair: {ACTIONS;DENIES/REPORTS:21021675::"Denies"} Difficulty using hands for taps, buttons, cutlery, and/or writing: {ACTIONS;DENIES/REPORTS:21021675::"Denies"}  No Rheumatology ROS completed.   PMFS History:  Patient Active Problem List   Diagnosis Date Noted   Intrinsic eczema 08/09/2022   BMI 30.0-30.9,adult    Numbness and tingling of right arm and leg 04/12/2020   Multiple sclerosis 04/11/2020   Viral URI with cough 06/06/2018    Past Medical History:  Diagnosis Date   Anxiety    BMI 30.0-30.9,adult    Depression    Multiple sclerosis (HCC)    Uterine fibroid     Family History  Problem Relation Age of Onset   Cancer Mother    HIV Father    Multiple sclerosis Daughter    Multiple sclerosis Cousin    Past Surgical History:  Procedure Laterality Date   ABDOMINAL HYSTERECTOMY     2010   CESAREAN SECTION     x 2   Social History   Social History Narrative   Right handed   1 cup coffee per day   Immunization History  Administered Date(s) Administered   Tdap 05/17/2013     Objective: Vital Signs: There were no vitals taken for this visit.   Physical Exam   Musculoskeletal Exam: ***  CDAI Exam: CDAI Score: -- Patient Global: --; Provider Global: -- Swollen: --; Tender:  -- Joint Exam 08/23/2022   No joint exam has been documented for this visit   There is currently no information documented on the homunculus. Go to the Rheumatology activity and complete the homunculus joint exam.  Investigation: No additional findings.  Imaging: MR Brain W and Wo Contrast  Result Date: 07/15/2022 CLINICAL DATA:  Neuro deficit, acute, stroke suspected. Right neck pain radiating to the arm. History of multiple sclerosis. EXAM: MRI HEAD WITHOUT AND WITH CONTRAST TECHNIQUE: Multiplanar, multiecho pulse sequences of the brain and surrounding structures were obtained without and with intravenous contrast. CONTRAST:  29mL GADAVIST GADOBUTROL 1 MMOL/ML IV SOLN COMPARISON:  Head MRI 02/08/2022 FINDINGS: Brain: There is no evidence of an acute infarct, intracranial hemorrhage, midline shift, or extra-axial fluid collection. The ventricles and sulci are normal. An 8 mm enhancing extra-axial mass along the right aspect of the falx/superior sagittal sinus at the vertex is unchanged, and there is no associated mass effect or brain edema. T2 hyperintensities in the cerebral white matter bilaterally, greatest in the periventricular regions, and in the right cerebral peduncle are unchanged, and none show enhancement. A partially empty sella is again noted. Vascular: Major intracranial vascular flow voids are preserved. Skull and upper cervical spine: Unremarkable bone marrow signal para Sinuses/Orbits: Unremarkable orbits. Mucous retention cyst in the right maxillary sinus. Trace right and small to moderate left mastoid effusions. Other: None. IMPRESSION: 1. No acute intracranial abnormality. 2. Unchanged white matter disease consistent  with the history of multiple sclerosis. No evidence of active demyelination. 3. 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 Electronically Signed   By: Logan Bores M.D.   On: 07/15/2022 17:47   DG Chest Port 1 View  Result Date: 07/15/2022 CLINICAL DATA:   Chest pain. EXAM: PORTABLE CHEST 1 VIEW COMPARISON:  CT chest dated October 16, 2021. Chest x-ray dated Oct 02, 2021. FINDINGS: The heart size and mediastinal contours are within normal limits. Normal pulmonary vascularity. Mild bibasilar atelectasis. Left lower lobe mass has mildly increased in size, currently measuring 4.5 cm, previously 4.2 cm. No focal consolidation, pleural effusion, or pneumothorax. No acute osseous abnormality. IMPRESSION: 1. Mild interval increase in size of left lower lobe mass. 2. No acute cardiopulmonary disease. Electronically Signed   By: Titus Dubin M.D.   On: 07/15/2022 14:04    Recent Labs: Lab Results  Component Value Date   WBC 4.9 07/15/2022   HGB 11.3 (L) 07/15/2022   PLT 313 07/15/2022   NA 137 07/15/2022   K 3.8 07/15/2022   CL 103 07/15/2022   CO2 24 07/15/2022   GLUCOSE 90 07/15/2022   BUN <5 (L) 07/15/2022   CREATININE 0.71 07/15/2022   BILITOT 0.2 (L) 07/15/2022   ALKPHOS 57 07/15/2022   AST 19 07/15/2022   ALT 17 07/15/2022   PROT 6.7 07/15/2022   ALBUMIN 3.2 (L) 07/15/2022   CALCIUM 8.8 (L) 07/15/2022   GFRAA >60 07/13/2017   QFTBGOLDPLUS Negative 12/07/2021   March 10, 2019 III nerve conduction velocity at [facility] right wrist and elbow normal  Speciality Comments: No specialty comments available.  Procedures:  No procedures performed Allergies: Naproxen, Pork-derived products, and Latex   Assessment / Plan:     Visit Diagnoses: Pain in right hand  Carpal tunnel syndrome, right upper limb  Numbness and tingling of right arm and leg  Multiple sclerosis  Neck pain  Lung nodules  Intrinsic eczema  Vitamin D deficiency  Orders: No orders of the defined types were placed in this encounter.  No orders of the defined types were placed in this encounter.   Face-to-face time spent with patient was *** minutes. Greater than 50% of time was spent in counseling and coordination of care.  Follow-Up Instructions: No  follow-ups on file.   Bo Merino, MD  Note - This record has been created using Editor, commissioning.  Chart creation errors have been sought, but may not always  have been located. Such creation errors do not reflect on  the standard of medical care.

## 2022-08-10 ENCOUNTER — Other Ambulatory Visit (HOSPITAL_COMMUNITY): Payer: Self-pay

## 2022-08-23 ENCOUNTER — Encounter: Payer: Medicaid Other | Admitting: Rheumatology

## 2022-08-23 DIAGNOSIS — E559 Vitamin D deficiency, unspecified: Secondary | ICD-10-CM

## 2022-08-23 DIAGNOSIS — M79641 Pain in right hand: Secondary | ICD-10-CM

## 2022-08-23 DIAGNOSIS — L2084 Intrinsic (allergic) eczema: Secondary | ICD-10-CM

## 2022-08-23 DIAGNOSIS — R918 Other nonspecific abnormal finding of lung field: Secondary | ICD-10-CM

## 2022-08-23 DIAGNOSIS — G35 Multiple sclerosis: Secondary | ICD-10-CM

## 2022-08-23 DIAGNOSIS — G5601 Carpal tunnel syndrome, right upper limb: Secondary | ICD-10-CM

## 2022-08-23 DIAGNOSIS — R200 Anesthesia of skin: Secondary | ICD-10-CM

## 2022-08-23 DIAGNOSIS — M542 Cervicalgia: Secondary | ICD-10-CM

## 2022-08-25 ENCOUNTER — Other Ambulatory Visit (HOSPITAL_COMMUNITY): Payer: Self-pay

## 2022-08-30 ENCOUNTER — Emergency Department (HOSPITAL_COMMUNITY): Payer: BLUE CROSS/BLUE SHIELD

## 2022-08-30 ENCOUNTER — Encounter (HOSPITAL_COMMUNITY): Payer: Self-pay | Admitting: *Deleted

## 2022-08-30 ENCOUNTER — Other Ambulatory Visit: Payer: Self-pay

## 2022-08-30 ENCOUNTER — Emergency Department (HOSPITAL_COMMUNITY)
Admission: EM | Admit: 2022-08-30 | Discharge: 2022-08-30 | Disposition: A | Discharge status: Home / Self Care | Payer: BLUE CROSS/BLUE SHIELD | Attending: Emergency Medicine | Admitting: Emergency Medicine

## 2022-08-30 DIAGNOSIS — R519 Headache, unspecified: Secondary | ICD-10-CM | POA: Insufficient documentation

## 2022-08-30 LAB — CBC WITH DIFFERENTIAL/PLATELET
Abs Immature Granulocytes: 0.01 10*3/uL (ref 0.00–0.07)
Basophils Absolute: 0 10*3/uL (ref 0.0–0.1)
Basophils Relative: 1 %
Eosinophils Absolute: 0.2 10*3/uL (ref 0.0–0.5)
Eosinophils Relative: 4 %
HCT: 41.1 % (ref 36.0–46.0)
Hemoglobin: 12.1 g/dL (ref 12.0–15.0)
Immature Granulocytes: 0 %
Lymphocytes Relative: 20 %
Lymphs Abs: 0.8 10*3/uL (ref 0.7–4.0)
MCH: 21.4 pg — ABNORMAL LOW (ref 26.0–34.0)
MCHC: 29.4 g/dL — ABNORMAL LOW (ref 30.0–36.0)
MCV: 72.6 fL — ABNORMAL LOW (ref 80.0–100.0)
Monocytes Absolute: 0.3 10*3/uL (ref 0.1–1.0)
Monocytes Relative: 7 %
Neutro Abs: 2.8 10*3/uL (ref 1.7–7.7)
Neutrophils Relative %: 68 %
Platelets: 310 10*3/uL (ref 150–400)
RBC: 5.66 MIL/uL — ABNORMAL HIGH (ref 3.87–5.11)
RDW: 15.5 % (ref 11.5–15.5)
WBC: 4.1 10*3/uL (ref 4.0–10.5)
nRBC: 0 % (ref 0.0–0.2)

## 2022-08-30 LAB — BASIC METABOLIC PANEL
Anion gap: 9 (ref 5–15)
BUN: 12 mg/dL (ref 6–20)
CO2: 24 mmol/L (ref 22–32)
Calcium: 8.9 mg/dL (ref 8.9–10.3)
Chloride: 105 mmol/L (ref 98–111)
Creatinine, Ser: 0.69 mg/dL (ref 0.44–1.00)
GFR, Estimated: >60 mL/min (ref >60)
Glucose, Bld: 107 mg/dL — ABNORMAL HIGH (ref 70–99)
Potassium: 3.6 mmol/L (ref 3.5–5.1)
Sodium: 138 mmol/L (ref 135–145)

## 2022-08-30 MED ORDER — LACTATED RINGERS IV BOLUS
1000.0000 mL | Freq: Once | INTRAVENOUS | Status: AC
  Administered 2022-08-30: 1000 mL via INTRAVENOUS

## 2022-08-30 MED ORDER — PROCHLORPERAZINE EDISYLATE 10 MG/2ML IJ SOLN
10.0000 mg | Freq: Once | INTRAMUSCULAR | Status: AC
  Administered 2022-08-30: 10 mg via INTRAVENOUS
  Filled 2022-08-30: qty 2

## 2022-08-30 MED ORDER — DIPHENHYDRAMINE HCL 50 MG/ML IJ SOLN
50.0000 mg | Freq: Once | INTRAMUSCULAR | Status: AC
  Administered 2022-08-30: 50 mg via INTRAVENOUS
  Filled 2022-08-30: qty 1

## 2022-08-30 MED ORDER — DEXAMETHASONE SODIUM PHOSPHATE 10 MG/ML IJ SOLN
6.0000 mg | Freq: Once | INTRAMUSCULAR | Status: AC
  Administered 2022-08-30: 6 mg via INTRAVENOUS
  Filled 2022-08-30: qty 1

## 2022-08-30 MED ORDER — ACETAMINOPHEN 500 MG PO TABS
1000.0000 mg | ORAL_TABLET | Freq: Once | ORAL | Status: AC
  Administered 2022-08-30: 1000 mg via ORAL
  Filled 2022-08-30: qty 2

## 2022-08-30 NOTE — ED Triage Notes (Signed)
Pt is here for headache for several days.  Pt has hx of headache and reports pressure behind left eye.

## 2022-08-30 NOTE — ED Notes (Signed)
Patient Alert and oriented to baseline. Stable and ambulatory to baseline. Patient verbalized understanding of the discharge instructions.  Patient belongings were taken by the patient.   

## 2022-08-30 NOTE — ED Provider Notes (Signed)
[place] EMERGENCY DEPARTMENT AT [facility] Provider Note   CSN: [id number] Arrival date & time: 08/30/22  0957     History Chief Complaint  Patient presents with   Headache    HPI [name] is a 47 y.o. female presenting for chief complaint of headache.  History of MS, endorses right arm numbness has been present for months was seen last month had a brain MRI that was negative followed up with her [facility] neurologist who had no further recommendations. States that she is having a headache for the last 4 days left-sided retro-orbital but worsened this morning approximately 2 hours ago.  Took her home headache medications without symptomatic improvement comes in for further treatment.  States she has needed treatment of headache in the past.. Again reinforced that symptoms worsened approximately 2 hours prior to arrival.  Patient's recorded medical, surgical, social, medication list and allergies were reviewed in the Snapshot window as part of the initial history.   Review of Systems   Review of Systems  Constitutional:  Negative for chills and fever.  HENT:  Negative for ear pain and sore throat.   Eyes:  Negative for pain and visual disturbance.  Respiratory:  Negative for cough and shortness of breath.   Cardiovascular:  Negative for chest pain and palpitations.  Gastrointestinal:  Negative for abdominal pain and vomiting.  Genitourinary:  Negative for dysuria and hematuria.  Musculoskeletal:  Negative for arthralgias and back pain.  Skin:  Negative for color change and rash.  Neurological:  Positive for numbness and headaches. Negative for seizures and syncope.  All other systems reviewed and are negative.   Physical Exam Updated Vital Signs BP (!) 178/97   Pulse 70   Temp 97.8 F (36.6 C) (Oral)   Resp 16   SpO2 99%  Physical Exam Vitals and nursing note reviewed.  Constitutional:      General: She is not in acute distress.    Appearance: She is  well-developed.  HENT:     Head: Normocephalic and atraumatic.  Eyes:     Conjunctiva/sclera: Conjunctivae normal.  Cardiovascular:     Rate and Rhythm: Normal rate and regular rhythm.     Heart sounds: No murmur heard. Pulmonary:     Effort: Pulmonary effort is normal. No respiratory distress.     Breath sounds: Normal breath sounds.  Abdominal:     Palpations: Abdomen is soft.     Tenderness: There is no abdominal tenderness.  Musculoskeletal:        General: No swelling.     Cervical back: Neck supple.  Skin:    General: Skin is warm and dry.     Capillary Refill: Capillary refill takes less than 2 seconds.  Neurological:     General: No focal deficit present.     Mental Status: She is alert and oriented to person, place, and time. Mental status is at baseline.     Cranial Nerves: No cranial nerve deficit.  Psychiatric:        Mood and Affect: Mood normal.      ED Course/ Medical Decision Making/ A&P    Procedures Procedures   Medications Ordered in ED Medications  prochlorperazine (COMPAZINE) injection 10 mg (10 mg Intravenous Given 08/30/22 1131)  diphenhydrAMINE (BENADRYL) injection 50 mg (50 mg Intravenous Given 08/30/22 1130)  acetaminophen (TYLENOL) tablet 1,000 mg (1,000 mg Oral Given 08/30/22 1131)  lactated ringers bolus 1,000 mL (1,000 mLs Intravenous New Bag/Given 08/30/22 1129)  dexamethasone (  DECADRON) injection 6 mg (6 mg Intravenous Given 08/30/22 1131)   Medical Decision Making:   [name] is a 47 y.o. female who presented to the ED today with headache detailed above.    Complete initial physical exam performed, notably the patient  was HDS in NAD.    Reviewed and confirmed nursing documentation for past medical history, family history, social history.    Initial Assessment:   With the patient's presentation of headache, most likely diagnosis is tension type headaches vs atypical migraines. Other diagnoses were considered including (but not  limited to) intracranial mass, intracranial hemorrhage, intracranial infection including meningitis vs encephalitis, GCA, trigeminal neuralgia. These are considered less likely due to history of present illness and physical exam findings.    This is most consistent with an acute life/limb threatening illness complicated by underlying chronic conditions.   Timeline and slow onset is not consistent with SAH/ICH   Age and description of pain is not consistent with GCA   Lack of fever,meningismus is not consistent with meningitis/encephalitis   Initial Plan:  Will initiate treatment with Compazine/Benardryll/NSAIDS/Tylenol for treatment of nonspecific headache  Screening labs including CBC and Metabolic panel to evaluate for infectious or metabolic etiology of disease.  CTH to evaluate for structural IC etiology  Objective evaluation as below reviewed   Initial Study Results:   Laboratory  All laboratory results reviewed without evidence of clinically relevant pathology.   Radiology:  All images reviewed independently. Agree with radiology report at this time.   CT HEAD WO CONTRAST ( )  Result Date: 08/30/2022 CLINICAL DATA:  Headache for several days, history of multiple sclerosis EXAM: CT HEAD WITHOUT CONTRAST TECHNIQUE: Contiguous axial images were obtained from the base of the skull through the vertex without intravenous contrast. RADIATION DOSE REDUCTION: This exam was performed according to the departmental dose-optimization program which includes automated exposure control, adjustment of the mA and/or kV according to patient size and/or use of iterative reconstruction technique. COMPARISON:  MR brain, 07/15/2022 FINDINGS: Brain: No evidence of acute infarction, hemorrhage, hydrocephalus, extra-axial collection or mass lesion/mass effect. Subtle periventricular white matter hypodensity. Vascular: No hyperdense vessel or unexpected calcification. Skull: Normal. Negative for fracture or focal  lesion. Sinuses/Orbits: No acute finding. Other: None. IMPRESSION: 1. No acute intracranial pathology. 2. Subtle periventricular white matter hypodensity, in keeping with history of multiple sclerosis and better appreciated by prior MR. Electronically Signed   By: Jearld Lesch M.D.   On: 08/30/2022 11:29    Final Assessment and Plan:   Patient's history of present on his physical exam findings are not consistent with any acute pathology.  Reassessed patient after 4 hours in emergency room.  Resting comfortably.  States headache is starting to go away.  Still has the scalp burning sensation that is atypical.  However given improving headache, negative CT scan and overall benign presentation I believe patient is stable for outpatient care management I recommend she call her neurologist now and to make an appointment for longer-term management of her headaches and patient expressed understanding.Disposition:  I have considered need for hospitalization, however, considering all of the above, I believe this patient is stable for discharge at this time.  Patient/family educated about specific return precautions for given chief complaint and symptoms.  Patient/family educated about follow-up with PCP .     Patient/family expressed understanding of return precautions and need for follow-up. Patient spoken to regarding all imaging and laboratory results and appropriate follow up for these results. All education provided  in verbal form with additional information in written form. Time was allowed for answering of patient questions. Patient discharged.    Emergency Department Medication Summary:   Medications  prochlorperazine (COMPAZINE) injection 10 mg (10 mg Intravenous Given 08/30/22 1131)  diphenhydrAMINE (BENADRYL) injection 50 mg (50 mg Intravenous Given 08/30/22 1130)  acetaminophen (TYLENOL) tablet 1,000 mg (1,000 mg Oral Given 08/30/22 1131)  lactated ringers bolus 1,000 mL (1,000 mLs Intravenous New  Bag/Given 08/30/22 1129)  dexamethasone (DECADRON) injection 6 mg (6 mg Intravenous Given 08/30/22 1131)     Clinical Impression:  1. Acute nonintractable headache, unspecified headache type      Data Unavailable   Final Clinical Impression(s) / ED Diagnoses Final diagnoses:  Acute nonintractable headache, unspecified headache type    Rx / DC Orders ED Discharge Orders     None         Glyn Ade, MD 08/30/22 [phone]

## 2022-08-31 ENCOUNTER — Other Ambulatory Visit (HOSPITAL_COMMUNITY): Payer: Self-pay

## 2022-09-01 ENCOUNTER — Other Ambulatory Visit (HOSPITAL_COMMUNITY): Payer: Self-pay

## 2022-09-05 ENCOUNTER — Other Ambulatory Visit (HOSPITAL_COMMUNITY): Payer: Self-pay

## 2022-09-06 ENCOUNTER — Other Ambulatory Visit: Payer: Self-pay

## 2022-09-06 ENCOUNTER — Other Ambulatory Visit (HOSPITAL_COMMUNITY): Payer: Self-pay

## 2022-09-06 MED ORDER — GLATIRAMER ACETATE 40 MG/ML ~~LOC~~ SOSY
PREFILLED_SYRINGE | SUBCUTANEOUS | 2 refills | Status: DC
  Filled 2023-01-16: qty 12, 28d supply, fill #0
  Filled 2023-02-15: qty 12, 28d supply, fill #1
  Filled 2023-03-14: qty 12, 28d supply, fill #2

## 2022-09-07 ENCOUNTER — Other Ambulatory Visit: Payer: Self-pay

## 2022-09-07 ENCOUNTER — Other Ambulatory Visit (HOSPITAL_COMMUNITY): Payer: Self-pay

## 2022-09-07 MED ORDER — GLATIRAMER ACETATE 40 MG/ML ~~LOC~~ SOSY
40.0000 mg | PREFILLED_SYRINGE | SUBCUTANEOUS | 2 refills | Status: AC
  Filled 2022-09-14: qty 12, 28d supply, fill #0
  Filled 2022-10-05: qty 12, 28d supply, fill #1
  Filled 2022-11-09: qty 12, 28d supply, fill #2

## 2022-09-08 ENCOUNTER — Other Ambulatory Visit (HOSPITAL_COMMUNITY): Payer: Self-pay

## 2022-09-09 ENCOUNTER — Other Ambulatory Visit: Payer: Self-pay

## 2022-09-09 ENCOUNTER — Emergency Department (HOSPITAL_BASED_OUTPATIENT_CLINIC_OR_DEPARTMENT_OTHER)
Admission: EM | Admit: 2022-09-09 | Discharge: 2022-09-09 | Disposition: A | Discharge status: Home / Self Care | Payer: BLUE CROSS/BLUE SHIELD | Attending: Emergency Medicine | Admitting: Emergency Medicine

## 2022-09-09 ENCOUNTER — Encounter (HOSPITAL_BASED_OUTPATIENT_CLINIC_OR_DEPARTMENT_OTHER): Payer: Self-pay | Admitting: Emergency Medicine

## 2022-09-09 DIAGNOSIS — Z91040 Latex allergy status: Secondary | ICD-10-CM | POA: Insufficient documentation

## 2022-09-09 DIAGNOSIS — G248 Other dystonia: Secondary | ICD-10-CM

## 2022-09-09 DIAGNOSIS — Z7982 Long term (current) use of aspirin: Secondary | ICD-10-CM | POA: Insufficient documentation

## 2022-09-09 DIAGNOSIS — G249 Dystonia, unspecified: Secondary | ICD-10-CM | POA: Insufficient documentation

## 2022-09-09 DIAGNOSIS — M79601 Pain in right arm: Secondary | ICD-10-CM | POA: Diagnosis present

## 2022-09-09 NOTE — ED Triage Notes (Signed)
Pt arrives via pov c/o recurrent right arm pain that started in her shoulder and radiates down her arm and upper back. Pt states being evaluated previously for similar s/s at [facility] ED, follow up with PCP, physical therapy, and "hand doctor." Denies recent injury/trauma.

## 2022-09-09 NOTE — ED Notes (Signed)
RN provided AVS using Teachback Method. Patient verbalizes understanding of Discharge Instructions. Opportunity for Questioning and Answers were provided by RN. Patient Discharged from ED ambulatory to Home. ° °

## 2022-09-09 NOTE — ED Provider Notes (Signed)
[place] EMERGENCY DEPARTMENT AT [facility]  Provider Note  CSN: [id number] Arrival date & time: 09/09/22 0234  History Chief Complaint  Patient presents with   Arm Pain    R    [name] is [name] 47 y.o. female with history of MS has had issues with her R arm/shoulder for [name] few months. She has occasional episodes where the arm gets tight and her hand 'draws up'. She was seen in the ED for same on 3/8 and had [name] negative workup then including MRI. She has been told this is not related to her MS. She has seen [facility] for possible cervical radiculopathy or carpal tunnel but has been told all that testing is normal. She reports an episode happened while she was in the shower about 2 hours prior to arrival. She usually just rests and the symptoms resolved but they were longer lasting tonight and so she decided to come to the ED, hoping to get [name] definitive diagnosis. While on the way here the tightness improved and is now resolved.    Home Medications Prior to Admission medications   Medication Sig Start Date End Date Taking? Authorizing Provider  Aspirin-Caffeine (BAYER BACK & BODY PO) Take 1 tablet by mouth every 6 (six) hours as needed (back pain).    [provider]  EMGALITY 120 MG/ML SOSY Inject 1 mL into the skin every 30 (thirty) days. 01/20/22   [provider]  gabapentin (NEURONTIN) 800 MG tablet Take 1 tablet (800 mg total) by mouth 3 (three) times daily. 06/16/22   Sater, Crystal Furl, MD  Glatiramer Acetate 40 MG/ML SOSY Inject 1 ml (40 mg) into the skin every Monday, Wednesday, and Friday 08/24/22     Glatiramer Acetate 40 MG/ML SOSY Inject 40 mg into the skin every Monday, Wednesday and Friday 09/07/22     imipramine (TOFRANIL) 25 MG tablet TAKE 1 TABLET BY MOUTH EVERYDAY AT BEDTIME 12/30/21   Sater, Crystal A, MD  KESIMPTA 20 MG/0.4ML SOAJ Inject 20mg  SQ at week 0, 1, & 2. Then start maintenance dose at week 4 06/14/22   Sater, Crystal Furl, MD  KESIMPTA  20 MG/0.4ML SOAJ Inject 0.4 mLs into the skin every 30 (thirty) days. 06/14/22   Sater, Crystal Furl, MD  lamoTRIgine (LAMICTAL) 25 MG tablet Take one pill daily x 3 days, then one pill twice daily x 3 days, then one pill three times daily x 3 days, then 2 pills twice daily. 06/24/22   Sater, Crystal Furl, MD  naproxen (NAPROSYN) 500 MG tablet Take 1 tablet (500 mg total) by mouth 2 (two) times daily. Patient not taking: Reported on 05/31/2022 02/09/22   Sponseller, Crystal Gavia, PA-C  predniSONE (STERAPRED UNI-PAK 21 TAB) 10 MG (21) TBPK tablet Take by mouth daily. Take 6 tabs by mouth daily  for 2 days, then 5 tabs for 2 days, then 4 tabs for 2 days, then 3 tabs for 2 days, 2 tabs for 2 days, then 1 tab by mouth daily for 2 days 07/15/22   Crystal Garter, PA  SUMAtriptan (IMITREX) 100 MG tablet Take 1 tablet (100 mg total) by mouth once as needed for up to 1 dose for migraine. May repeat in 2 hours if headache persists or recurs. Patient not taking: Reported on 05/31/2022 05/19/21   Lomax, Amy, NP  UBRELVY 100 MG TABS Take 1 tablet (100 mg total) by mouth as needed. Can take one additional dose at least 2 hours  after the initial dose if needed. Max daily dose is 200 mg total. 06/14/22   Sater, Crystal Furl, MD     Allergies    Naproxen, Pork-derived products, and Latex   Review of Systems   Review of Systems Please see HPI for pertinent positives and negatives  Physical Exam BP (!) 163/105 (BP Location: Right Arm)   Pulse 76   Temp 97.9 F (36.6 C) (Oral)   Resp 15   SpO2 100%   Physical Exam Vitals and nursing note reviewed.  Constitutional:      Appearance: Normal appearance.  HENT:     Head: Normocephalic and atraumatic.     Nose: Nose normal.     Mouth/Throat:     Mouth: Mucous membranes are moist.  Eyes:     Extraocular Movements: Extraocular movements intact.     Conjunctiva/sclera: Conjunctivae normal.  Cardiovascular:     Rate and Rhythm: Normal rate.     Pulses: Normal pulses.   Pulmonary:     Effort: Pulmonary effort is normal.     Breath sounds: Normal breath sounds.  Abdominal:     General: Abdomen is flat.     Palpations: Abdomen is soft.     Tenderness: There is no abdominal tenderness.  Musculoskeletal:        General: No swelling. Normal range of motion.     Cervical back: Neck supple.  Skin:    General: Skin is warm and dry.  Neurological:     General: No focal deficit present.     Mental Status: She is alert and oriented to person, place, and time.     Cranial Nerves: No cranial nerve deficit.     Sensory: No sensory deficit.     Motor: No weakness.  Psychiatric:        Mood and Affect: Mood normal.     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 She has had extensive workup for this in the recent past which has been unrevealing. I explained that I do not have any additional testing available in the ED that would be helpful. I offered medications to help with any residual pain but she has declined. No other emergent conditions are identified. Recommend she continue follow up with her Neurologist if symptoms persist.   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

## 2022-09-14 ENCOUNTER — Other Ambulatory Visit: Payer: Self-pay

## 2022-09-14 ENCOUNTER — Other Ambulatory Visit (HOSPITAL_COMMUNITY): Payer: Self-pay

## 2022-09-14 MED ORDER — ALCOHOL SWABS PADS
MEDICATED_PAD | 6 refills | Status: AC
  Filled 2022-09-14: qty 200, 90d supply, fill #0
  Filled 2022-09-14: qty 200, 30d supply, fill #0

## 2022-09-14 MED ORDER — SHARPS CONTAINER MISC
6 refills | Status: AC
  Filled 2022-09-14 (×2): qty 1, 30d supply, fill #0

## 2022-09-15 ENCOUNTER — Other Ambulatory Visit: Payer: Self-pay

## 2022-09-19 ENCOUNTER — Telehealth: Payer: Self-pay | Admitting: Pharmacy Technician

## 2022-09-19 NOTE — Telephone Encounter (Signed)
Patient Advocate Encounter   Received notification that prior authorization for Emgality 120MG /ML syringes (migraine) is required.   PA submitted on 09/19/2022 [employer] [place] Medicaid Migraine Calcitonin Agents Preventative Prior Authorization Form Status is pending

## 2022-09-21 ENCOUNTER — Telehealth: Payer: Self-pay

## 2022-09-21 ENCOUNTER — Other Ambulatory Visit (HOSPITAL_COMMUNITY): Payer: Self-pay

## 2022-09-21 NOTE — Telephone Encounter (Signed)
Pharmacy Patient Advocate Encounter   Received notification from [facility] that prior authorization for Ubrelvy 100MG  Tablets is required/requested.   PA submitted on 09/21/2022 to (ins) [employer] via [employer] or [facility]) confirmation # [id number] Status is pending

## 2022-09-21 NOTE — Telephone Encounter (Signed)
Received letter from [facility] that they do not handle thew PA's for the PT-to submit to primary payor-[employer]   Pharmacy Patient Advocate Encounter   Received notification that prior authorization for Emgality 120MG /ML syringes (migraine) is required/requested.   PA submitted on 09/21/2022 to (ins) [employer] [place] via [employer] or [facility]) confirmation # [id number] Status is pending

## 2022-09-23 ENCOUNTER — Other Ambulatory Visit (HOSPITAL_COMMUNITY): Payer: Self-pay

## 2022-09-23 NOTE — Telephone Encounter (Signed)
Pharmacy Patient Advocate Encounter  Prior Authorization for Emgality 120MG /ML syringes (migraine) has been approved by [employer] (ins).    PA # [id number] Effective dates: 09/21/2022 through 09/21/2022   Copay is $0 per test bill.

## 2022-09-26 ENCOUNTER — Other Ambulatory Visit (HOSPITAL_COMMUNITY): Payer: Self-pay

## 2022-09-26 NOTE — Telephone Encounter (Signed)
Pharmacy Patient Advocate Encounter  Prior Authorization for Ubrelvy 100MG  Tablet has been approved by [employer] [place] (ins).    PA # PA Case ID #: [id number] Effective dates: 09/23/2022 through 09/22/2023

## 2022-10-05 ENCOUNTER — Other Ambulatory Visit: Payer: Self-pay

## 2022-10-05 ENCOUNTER — Other Ambulatory Visit (HOSPITAL_COMMUNITY): Payer: Self-pay

## 2022-10-10 ENCOUNTER — Other Ambulatory Visit (HOSPITAL_COMMUNITY): Payer: Self-pay

## 2022-10-13 ENCOUNTER — Ambulatory Visit: Payer: Medicaid Other | Admitting: Rheumatology

## 2022-10-20 ENCOUNTER — Other Ambulatory Visit (HOSPITAL_COMMUNITY): Payer: Self-pay

## 2022-10-21 ENCOUNTER — Other Ambulatory Visit (HOSPITAL_COMMUNITY): Payer: Self-pay

## 2022-10-21 DIAGNOSIS — D1430 Benign neoplasm of unspecified bronchus and lung: Secondary | ICD-10-CM | POA: Diagnosis not present

## 2022-10-31 ENCOUNTER — Other Ambulatory Visit (HOSPITAL_COMMUNITY): Payer: Self-pay

## 2022-11-02 ENCOUNTER — Other Ambulatory Visit (HOSPITAL_COMMUNITY): Payer: Self-pay

## 2022-11-09 ENCOUNTER — Other Ambulatory Visit (HOSPITAL_COMMUNITY): Payer: Self-pay

## 2022-11-09 ENCOUNTER — Other Ambulatory Visit: Payer: Self-pay

## 2022-11-11 ENCOUNTER — Other Ambulatory Visit (HOSPITAL_COMMUNITY): Payer: Self-pay

## 2022-11-11 ENCOUNTER — Other Ambulatory Visit: Payer: Self-pay

## 2022-11-22 ENCOUNTER — Other Ambulatory Visit (HOSPITAL_COMMUNITY): Payer: Self-pay

## 2022-11-24 ENCOUNTER — Other Ambulatory Visit (HOSPITAL_COMMUNITY): Payer: Self-pay

## 2022-12-06 ENCOUNTER — Other Ambulatory Visit (HOSPITAL_COMMUNITY): Payer: Self-pay

## 2022-12-13 ENCOUNTER — Other Ambulatory Visit (HOSPITAL_COMMUNITY): Payer: Self-pay

## 2022-12-14 ENCOUNTER — Emergency Department (HOSPITAL_BASED_OUTPATIENT_CLINIC_OR_DEPARTMENT_OTHER): Payer: BLUE CROSS/BLUE SHIELD

## 2022-12-14 ENCOUNTER — Encounter (HOSPITAL_BASED_OUTPATIENT_CLINIC_OR_DEPARTMENT_OTHER): Payer: Self-pay | Admitting: Emergency Medicine

## 2022-12-14 ENCOUNTER — Emergency Department (HOSPITAL_BASED_OUTPATIENT_CLINIC_OR_DEPARTMENT_OTHER)
Admission: EM | Admit: 2022-12-14 | Discharge: 2022-12-14 | Disposition: A | Discharge status: Home / Self Care | Payer: BLUE CROSS/BLUE SHIELD | Source: Home / Self Care | Attending: Emergency Medicine | Admitting: Emergency Medicine

## 2022-12-14 ENCOUNTER — Other Ambulatory Visit: Payer: Self-pay

## 2022-12-14 DIAGNOSIS — D649 Anemia, unspecified: Secondary | ICD-10-CM | POA: Insufficient documentation

## 2022-12-14 DIAGNOSIS — I10 Essential (primary) hypertension: Secondary | ICD-10-CM | POA: Insufficient documentation

## 2022-12-14 DIAGNOSIS — Z79899 Other long term (current) drug therapy: Secondary | ICD-10-CM | POA: Diagnosis not present

## 2022-12-14 DIAGNOSIS — Z91040 Latex allergy status: Secondary | ICD-10-CM | POA: Insufficient documentation

## 2022-12-14 DIAGNOSIS — R0602 Shortness of breath: Secondary | ICD-10-CM | POA: Diagnosis present

## 2022-12-14 DIAGNOSIS — C499 Malignant neoplasm of connective and soft tissue, unspecified: Secondary | ICD-10-CM | POA: Diagnosis not present

## 2022-12-14 LAB — CBC WITH DIFFERENTIAL/PLATELET
Abs Immature Granulocytes: 0.03 10*3/uL (ref 0.00–0.07)
Basophils Absolute: 0 10*3/uL (ref 0.0–0.1)
Basophils Relative: 0 %
Eosinophils Absolute: 0.4 10*3/uL (ref 0.0–0.5)
Eosinophils Relative: 6 %
HCT: 31 % — ABNORMAL LOW (ref 36.0–46.0)
Hemoglobin: 9.2 g/dL — ABNORMAL LOW (ref 12.0–15.0)
Immature Granulocytes: 0 %
Lymphocytes Relative: 20 %
Lymphs Abs: 1.4 10*3/uL (ref 0.7–4.0)
MCH: 20.4 pg — ABNORMAL LOW (ref 26.0–34.0)
MCHC: 29.7 g/dL — ABNORMAL LOW (ref 30.0–36.0)
MCV: 68.9 fL — ABNORMAL LOW (ref 80.0–100.0)
Monocytes Absolute: 0.3 10*3/uL (ref 0.1–1.0)
Monocytes Relative: 4 %
Neutro Abs: 4.9 10*3/uL (ref 1.7–7.7)
Neutrophils Relative %: 70 %
Platelets: 459 10*3/uL — ABNORMAL HIGH (ref 150–400)
RBC: 4.5 MIL/uL (ref 3.87–5.11)
RDW: 15.5 % (ref 11.5–15.5)
WBC: 7 10*3/uL (ref 4.0–10.5)
nRBC: 0 % (ref 0.0–0.2)

## 2022-12-14 LAB — URINALYSIS, ROUTINE W REFLEX MICROSCOPIC
Bacteria, UA: NONE SEEN
Bilirubin Urine: NEGATIVE
Glucose, UA: NEGATIVE mg/dL
Ketones, ur: NEGATIVE mg/dL
Leukocytes,Ua: NEGATIVE
Nitrite: NEGATIVE
Protein, ur: NEGATIVE mg/dL
Specific Gravity, Urine: 1.035 — ABNORMAL HIGH (ref 1.005–1.030)
pH: 7.5 (ref 5.0–8.0)

## 2022-12-14 LAB — BASIC METABOLIC PANEL
Anion gap: 8 (ref 5–15)
BUN: 8 mg/dL (ref 6–20)
CO2: 27 mmol/L (ref 22–32)
Calcium: 8.9 mg/dL (ref 8.9–10.3)
Chloride: 104 mmol/L (ref 98–111)
Creatinine, Ser: 0.69 mg/dL (ref 0.44–1.00)
GFR, Estimated: >60 mL/min (ref >60)
Glucose, Bld: 99 mg/dL (ref 70–99)
Potassium: 3.8 mmol/L (ref 3.5–5.1)
Sodium: 139 mmol/L (ref 135–145)

## 2022-12-14 LAB — BRAIN NATRIURETIC PEPTIDE: B Natriuretic Peptide: 14.9 pg/mL (ref 0.0–100.0)

## 2022-12-14 MED ORDER — SODIUM CHLORIDE 0.9 % IV BOLUS
1000.0000 mL | Freq: Once | INTRAVENOUS | Status: AC
  Administered 2022-12-14: 1000 mL via INTRAVENOUS

## 2022-12-14 MED ORDER — IOHEXOL 350 MG/ML SOLN
100.0000 mL | Freq: Once | INTRAVENOUS | Status: AC | PRN
  Administered 2022-12-14: 75 mL via INTRAVENOUS

## 2022-12-14 MED ORDER — FERROUS SULFATE 325 (65 FE) MG PO TABS: 325.0000 mg | ORAL_TABLET | Freq: Every day | ORAL | 0 refills | Status: AC

## 2022-12-14 NOTE — ED Notes (Signed)
Pt discharged to home using teachback Method. Discharge instructions have been discussed with patient and/or family members. Pt verbally acknowledges understanding d/c instructions, has been given opportunity for questions to be answered, and endorses comprehension to checkout at registration before leaving.  

## 2022-12-14 NOTE — ED Provider Notes (Signed)
[facility]MENT AT [facility] Provider Note   CSN: [id number] Arrival date & time: 12/14/22  1421     History  Chief Complaint  Patient presents with   Shortness of Breath    [name] is a 47 y.o. female.  Pt is a 47 yo female with pmhx significant for fibroids, anxiety, depression, MS, HTN, and s/p LUL resection times 2 and LLL wedge resection times 2 and dx of metastatic leiomyosarcoma.  Pt had this surgery done on 7/30 and was d/c on 8/2.  Pt said she has been having sob intermittently since then.  She has an appt on 8/9, but does not think she can wait that long.  No cp.  No fever.       Home Medications Prior to Admission medications   Medication Sig Start Date End Date Taking? Authorizing Provider  ferrous sulfate 325 (65 FE) MG tablet Take 1 tablet (325 mg total) by mouth daily. 12/14/22  Yes Jacalyn Lefevre, MD  Alcohol Swabs PADS Use as directed 09/07/22   Iven Finn., MD  Aspirin-Caffeine (BAYER BACK & BODY PO) Take 1 tablet by mouth every 6 (six) hours as needed (back pain).    [provider]  EMGALITY 120 MG/ML SOSY Inject 1 mL into the skin every 30 (thirty) days. 01/20/22   [provider]  gabapentin (NEURONTIN) 800 MG tablet Take 1 tablet (800 mg total) by mouth 3 (three) times daily. 06/16/22   Sater, Pearletha Furl, MD  Glatiramer Acetate 40 MG/ML SOSY Inject 1 ml (40 mg) into the skin every Monday, Wednesday, and Friday 08/24/22     Glatiramer Acetate 40 MG/ML SOSY Inject 40 mg into the skin every Monday, Wednesday and Friday 09/07/22     imipramine (TOFRANIL) 25 MG tablet TAKE 1 TABLET BY MOUTH EVERYDAY AT BEDTIME 12/30/21   Sater, Richard A, MD  KESIMPTA 20 MG/0.4ML SOAJ Inject 20mg  SQ at week 0, 1, & 2. Then start maintenance dose at week 4 06/14/22   Sater, Pearletha Furl, MD  KESIMPTA 20 MG/0.4ML SOAJ Inject 0.4 mLs into the skin every 30 (thirty) days. 06/14/22   Sater, Pearletha Furl, MD  lamoTRIgine (LAMICTAL) 25 MG tablet Take  one pill daily x 3 days, then one pill twice daily x 3 days, then one pill three times daily x 3 days, then 2 pills twice daily. 06/24/22   Sater, Pearletha Furl, MD  naproxen (NAPROSYN) 500 MG tablet Take 1 tablet (500 mg total) by mouth 2 (two) times daily. Patient not taking: Reported on 05/31/2022 02/09/22   Sponseller, Eugene Gavia, PA-C  predniSONE (STERAPRED UNI-PAK 21 TAB) 10 MG (21) TBPK tablet Take by mouth daily. Take 6 tabs by mouth daily  for 2 days, then 5 tabs for 2 days, then 4 tabs for 2 days, then 3 tabs for 2 days, 2 tabs for 2 days, then 1 tab by mouth daily for 2 days 07/15/22   Peter Garter, PA  sharps container Use as directed 09/07/22   Iven Finn., MD  SUMAtriptan (IMITREX) 100 MG tablet Take 1 tablet (100 mg total) by mouth once as needed for up to 1 dose for migraine. May repeat in 2 hours if headache persists or recurs. Patient not taking: Reported on 05/31/2022 05/19/21   Lomax, Amy, NP  UBRELVY 100 MG TABS Take 1 tablet (100 mg total) by mouth as needed. Can take one additional dose at least 2 hours after the initial dose if  needed. Max daily dose is 200 mg total. 06/14/22   Sater, Pearletha Furl, MD      Allergies    Naproxen, Pork-derived products, and Latex    Review of Systems   Review of Systems  Respiratory:  Positive for shortness of breath.   All other systems reviewed and are negative.   Physical Exam Updated Vital Signs BP 139/86   Pulse 100   Temp 98.1 F (36.7 C)   Resp 20   SpO2 97%  Physical Exam Vitals and nursing note reviewed.  Constitutional:      Appearance: She is well-developed.  HENT:     Head: Normocephalic and atraumatic.     Mouth/Throat:     Mouth: Mucous membranes are moist.     Pharynx: Oropharynx is clear.  Eyes:     Extraocular Movements: Extraocular movements intact.     Pupils: Pupils are equal, round, and reactive to light.  Cardiovascular:     Rate and Rhythm: Normal rate and regular rhythm.  Pulmonary:     Effort: Pulmonary  effort is normal.     Comments: Slight decreased bs on left  Chest:     Comments: Left side surgical sites look good Abdominal:     General: Bowel sounds are normal.     Palpations: Abdomen is soft.  Musculoskeletal:        General: Normal range of motion.     Cervical back: Normal range of motion and neck supple.  Skin:    General: Skin is warm.     Capillary Refill: Capillary refill takes less than 2 seconds.  Neurological:     General: No focal deficit present.     Mental Status: She is alert and oriented to person, place, and time.  Psychiatric:        Mood and Affect: Mood normal.        Behavior: Behavior normal.     ED Results / Procedures / Treatments   Labs (all labs ordered are listed, but only abnormal results are displayed) Labs Reviewed  CBC WITH DIFFERENTIAL/PLATELET - Abnormal; Notable for the following components:      Result Value   Hemoglobin 9.2 (*)    HCT 31.0 (*)    MCV 68.9 (*)    MCH 20.4 (*)    MCHC 29.7 (*)    Platelets 459 (*)    All other components within normal limits  URINALYSIS, ROUTINE W REFLEX MICROSCOPIC - Abnormal; Notable for the following components:   Color, Urine COLORLESS (*)    Specific Gravity, Urine 1.035 (*)    Hgb urine dipstick TRACE (*)    All other components within normal limits  BASIC METABOLIC PANEL  BRAIN NATRIURETIC PEPTIDE    EKG EKG Interpretation Date/Time:  Wednesday December 14 2022 15:24:39 EDT Ventricular Rate:  98 PR Interval:  157 QRS Duration:  86 QT Interval:  347 QTC Calculation: 443 R Axis:   49  Text Interpretation: Sinus rhythm Confirmed by Ernie Avena ([id number]) on 12/14/2022 3:34:46 PM  Radiology CT Angio Chest PE W and/or Wo Contrast  Result Date: 12/14/2022 CLINICAL DATA:  Recent left lung surgery for nodules. Shortness of breath with upper back pain EXAM: CT ANGIOGRAPHY CHEST WITH CONTRAST TECHNIQUE: Multidetector CT imaging of the chest was performed using the standard protocol during bolus  administration of intravenous contrast. Multiplanar CT image reconstructions and MIPs were obtained to evaluate the vascular anatomy. RADIATION DOSE REDUCTION: This exam was performed according to the departmental dose-optimization  program which includes automated exposure control, adjustment of the mA and/or kV according to patient size and/or use of iterative reconstruction technique. CONTRAST:  75mL OMNIPAQUE IOHEXOL 350 MG/ML SOLN COMPARISON:  10/19/2022 CT scan FINDINGS: Cardiovascular: No filling defect is identified in the pulmonary arterial tree to suggest pulmonary embolus. Mild cardiomegaly. 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 Mediastinum/Nodes: Unremarkable Lungs/Pleura: Wedge resections staple lines anteriorly in the left upper lobe and also in the left lower lobe. Architectural distortion in density noted along these staple lines, especially along the left lower lobe staple line which is in the vicinity of the dominant left lower lobe pulmonary nodule. At least 1 pulmonary nodule, posteriorly in the left upper lobe, persists, measuring 0.7 by 0.6 cm on image 61 series 7. Small but likely loculated left pleural effusion observed, internal density 12 Hounsfield units. Small amount of gas in 1 of the loculations posteriorly in the left lower lobe on image 90 series 7. Very miniscule loculation of pleural gas anteriorly on image 72 series 7. No free-flowing pneumothorax is identified. Likely postoperative gas tracks along the left posterolateral chest wall soft tissues. Right lower lobe subpleural nodule 0.5 by 0.3 cm on image 91 series 7, stable. Trace right pleural effusion. Upper Abdomen: Unremarkable Musculoskeletal: Unremarkable Review of the MIP images confirms the above findings. IMPRESSION: 1. No filling defect is identified in the pulmonary arterial tree to suggest pulmonary embolus. 2. Postoperative findings in the left lung with wedge resections in the left upper lobe and  left lower lobe. There is a small but likely loculated left pleural effusion with a small amount of gas in one of the loculations posteriorly in the left lower lobe. No significant free-flowing pneumothorax is identified. 3. Residual nodule posteriorly in the left upper lobe, persists, measuring 0.7 by 0.6 cm. Various other left-sided nodules have been intervally resected. 4. Trace right pleural effusion. 5. Mild cardiomegaly. 6. 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 Electronically Signed   By: Gaylyn Rong M.D.   On: 12/14/2022 17:24    Procedures Procedures    Medications Ordered in ED Medications  sodium chloride 0.9 % bolus 1,000 mL (1,000 mLs Intravenous New Bag/Given 12/14/22 1615)  iohexol (OMNIPAQUE) 350 MG/ML injection 100 mL (75 mLs Intravenous Contrast Given 12/14/22 1640)    ED Course/ Medical Decision Making/ A&P                                 Medical Decision Making Amount and/or Complexity of Data Reviewed Labs: ordered. Radiology: ordered.   This patient presents to the ED for concern of spb, this involves an extensive number of treatment options, and is a complaint that carries with it a high risk of complications and morbidity.  The differential diagnosis includes pe, pna, abscess   Co morbidities that complicate the patient evaluation  fibroids, anxiety, depression, MS, HTN, and s/p LUL resection times 2 and LLL wedge resection times 2 and dx of metastatic leiomyosarcoma   Additional history obtained:  Additional history obtained from epic chart review External records from outside source obtained and reviewed including family   Lab Tests:  I Ordered, and personally interpreted labs.  The pertinent results include:  cbc with hgb low at 9.2 (hgb 12.1 prior to surgery and 8.8 at d/c),    Imaging Studies ordered:  I ordered imaging studies including cta  I independently visualized and interpreted imaging which showed  No filling  defect is identified in the pulmonary arterial tree to  suggest pulmonary embolus.  2. Postoperative findings in the left lung with wedge resections in  the left upper lobe and left lower lobe. There is a small but likely  loculated left pleural effusion with a small amount of gas in one of  the loculations posteriorly in the left lower lobe. No significant  free-flowing pneumothorax is identified.  3. Residual nodule posteriorly in the left upper lobe, persists,  measuring 0.7 by 0.6 cm. Various other left-sided nodules have been  intervally resected.  4. Trace right pleural effusion.  5. Mild cardiomegaly.  6. 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   I agree with the radiologist interpretation   Cardiac Monitoring:  The patient was maintained on a cardiac monitor.  I personally viewed and interpreted the cardiac monitored which showed an underlying rhythm of: st   Medicines ordered and prescription drug management:  I have reviewed the patients home medicines and have made adjustments as needed   Test Considered:  ct   Problem List / ED Course:  Sob:  it is intermittent and pulse ox nl.  It is likely due to anemia from surgery.  I am going to have her start iron pills.  Pt has an appt with her doc in 2 days.  She is to return if worse.   Reevaluation:  After the interventions noted above, I reevaluated the patient and found that they have :improved   Social Determinants of Health:  Lives at home   Dispostion:  After consideration of the diagnostic results and the patients response to treatment, I feel that the patent would benefit from discharge with outpatient f/u.          Final Clinical Impression(s) / ED Diagnoses Final diagnoses:  Anemia, unspecified type  Leiomyosarcoma (HCC)    Rx / DC Orders ED Discharge Orders          Ordered    ferrous sulfate 325 (65 FE) MG tablet  Daily        12/14/22 1743               Jacalyn Lefevre, MD 12/14/22 1744

## 2022-12-14 NOTE — ED Triage Notes (Signed)
Pt arrives pov, c/o shob since yesterday, worsening today. Increased [place] of inhaler. Endorses Recent lung surgery

## 2022-12-15 ENCOUNTER — Other Ambulatory Visit (HOSPITAL_COMMUNITY): Payer: Self-pay

## 2022-12-19 ENCOUNTER — Encounter (HOSPITAL_COMMUNITY): Payer: Self-pay

## 2022-12-19 ENCOUNTER — Other Ambulatory Visit (HOSPITAL_COMMUNITY): Payer: Self-pay

## 2023-01-05 ENCOUNTER — Other Ambulatory Visit (HOSPITAL_COMMUNITY): Payer: Self-pay

## 2023-01-16 ENCOUNTER — Other Ambulatory Visit (HOSPITAL_COMMUNITY): Payer: Self-pay

## 2023-02-09 ENCOUNTER — Other Ambulatory Visit (HOSPITAL_COMMUNITY): Payer: Self-pay

## 2023-02-13 ENCOUNTER — Other Ambulatory Visit (HOSPITAL_COMMUNITY): Payer: Self-pay

## 2023-02-15 ENCOUNTER — Other Ambulatory Visit (HOSPITAL_COMMUNITY): Payer: Self-pay | Admitting: Pharmacy Technician

## 2023-02-15 ENCOUNTER — Other Ambulatory Visit (HOSPITAL_COMMUNITY): Payer: Self-pay

## 2023-02-15 NOTE — Progress Notes (Signed)
Specialty Pharmacy Refill Coordination Note  [name] is a 47 y.o. female contacted today regarding refills of specialty medication(s) Glatiramer Acetate   Patient requested Delivery Delivery date: 02/17/23 Verified address: [address] [place] [address]   Medication will be filled on 02/16/23.

## 2023-02-16 ENCOUNTER — Other Ambulatory Visit: Payer: Self-pay

## 2023-03-10 ENCOUNTER — Other Ambulatory Visit: Payer: Self-pay

## 2023-03-13 ENCOUNTER — Other Ambulatory Visit: Payer: Self-pay

## 2023-03-14 ENCOUNTER — Other Ambulatory Visit (HOSPITAL_COMMUNITY): Payer: Self-pay

## 2023-03-14 NOTE — Progress Notes (Signed)
Specialty Pharmacy Refill Coordination Note  [name] is a 47 y.o. female contacted today regarding refills of specialty medication(s) Glatiramer Acetate   Patient requested Delivery   Delivery date: 03/17/23   Verified address: [address] [name] [address]   Medication will be filled on 03/16/23.

## 2023-03-14 NOTE — Progress Notes (Signed)
Specialty Pharmacy Ongoing Clinical Assessment Note  [name] is a 47 y.o. female who is being followed by the specialty pharmacy service for RxSp Multiple Sclerosis   Patient's specialty medication(s) reviewed today: Glatiramer Acetate   Missed doses in the last 4 weeks: 0   Patient/Caregiver asked additional questions regarding administration, patient was counseled.  Patient was encouraged to call if any other questions arise .  Therapeutic benefit summary: Patient is achieving benefit   Adverse events/side effects summary: No adverse events/side effects   Patient's therapy is appropriate to: Continue    Goals Addressed             This Visit's Progress    Reduce frequency/shorten duration of relapse       Patient is on track. Patient will maintain adherence.  [name] reports that she is relatively well-controlled, but has an upcoming MRI to find out more.          Follow up:  6 months  Servando Snare Specialty Pharmacist   Clinical Intervention Note  Clinical Intervention Notes: Patient had administration questions as she used to use the [employer] and her new provider didn't prescibe that for her.  I verbally went through the administration process with her and directed her to the visuals provided at [email or web address].  I advised her to discuss her preference for the [employer]per[employer]ect with her provders office, as well.   Clinical Intervention Outcomes: Improved therapy adherence   Servando Snare Specialty Pharmacist

## 2023-04-05 ENCOUNTER — Other Ambulatory Visit: Payer: Self-pay

## 2023-04-05 NOTE — Progress Notes (Signed)
Specialty Pharmacy Refill Coordination Note  [name] is a 47 y.o. female contacted today regarding refills of specialty medication(s) Glatiramer Acetate   Patient requested Delivery   Delivery date: 04/14/23   Verified address: [address] [name] [address]   Medication will be filled on 04/13/23.  Refill request pending.

## 2023-04-07 ENCOUNTER — Other Ambulatory Visit (HOSPITAL_COMMUNITY): Payer: Self-pay

## 2023-04-07 ENCOUNTER — Other Ambulatory Visit: Payer: Self-pay

## 2023-04-07 MED ORDER — GLATIRAMER ACETATE 40 MG/ML ~~LOC~~ SOSY
PREFILLED_SYRINGE | SUBCUTANEOUS | 2 refills | Status: DC
  Filled 2023-04-07: qty 12, 28d supply, fill #0
  Filled 2023-05-08: qty 12, 28d supply, fill #1
  Filled 2023-06-06 (×2): qty 12, 28d supply, fill #2

## 2023-04-13 ENCOUNTER — Other Ambulatory Visit: Payer: Self-pay

## 2023-05-02 ENCOUNTER — Other Ambulatory Visit: Payer: Self-pay | Admitting: Neurology

## 2023-05-02 DIAGNOSIS — G43709 Chronic migraine without aura, not intractable, without status migrainosus: Secondary | ICD-10-CM

## 2023-05-02 NOTE — Telephone Encounter (Signed)
Rx refilled.

## 2023-05-05 ENCOUNTER — Other Ambulatory Visit: Payer: Self-pay

## 2023-05-08 ENCOUNTER — Other Ambulatory Visit (HOSPITAL_COMMUNITY): Payer: Self-pay | Admitting: Pharmacy Technician

## 2023-05-08 ENCOUNTER — Other Ambulatory Visit (HOSPITAL_COMMUNITY): Payer: Self-pay

## 2023-05-08 NOTE — Progress Notes (Signed)
Specialty Pharmacy Refill Coordination Note  [name] is a 48 y.o. female contacted today regarding refills of specialty medication(s) Glatiramer Acetate   Patient requested Delivery   Delivery date: 05/16/23   Verified address: [address]  [place]   Medication will be filled on 05/15/23.

## 2023-05-15 ENCOUNTER — Other Ambulatory Visit: Payer: Self-pay

## 2023-05-22 ENCOUNTER — Emergency Department (HOSPITAL_COMMUNITY): Payer: BLUE CROSS/BLUE SHIELD

## 2023-05-22 ENCOUNTER — Encounter (HOSPITAL_COMMUNITY): Payer: Self-pay | Admitting: *Deleted

## 2023-05-22 ENCOUNTER — Other Ambulatory Visit: Payer: Self-pay

## 2023-05-22 ENCOUNTER — Emergency Department (HOSPITAL_COMMUNITY)
Admission: EM | Admit: 2023-05-22 | Discharge: 2023-05-23 | Disposition: A | Discharge status: Home / Self Care | Payer: BLUE CROSS/BLUE SHIELD | Attending: Emergency Medicine | Admitting: Emergency Medicine

## 2023-05-22 DIAGNOSIS — W000XXA Fall on same level due to ice and snow, initial encounter: Secondary | ICD-10-CM | POA: Insufficient documentation

## 2023-05-22 DIAGNOSIS — S0003XA Contusion of scalp, initial encounter: Secondary | ICD-10-CM | POA: Insufficient documentation

## 2023-05-22 DIAGNOSIS — W19XXXA Unspecified fall, initial encounter: Secondary | ICD-10-CM

## 2023-05-22 DIAGNOSIS — Z91040 Latex allergy status: Secondary | ICD-10-CM | POA: Diagnosis not present

## 2023-05-22 DIAGNOSIS — S0990XA Unspecified injury of head, initial encounter: Secondary | ICD-10-CM | POA: Diagnosis present

## 2023-05-22 NOTE — ED Triage Notes (Signed)
 Pt says she walked, slipped on ice and fell back onto concrete. She says her head was hurting so bad she could not get up. Denies nausea/vomiting or dizziness. No back pain, no numbness or tingling.  C collar remains in place.

## 2023-05-22 NOTE — ED Triage Notes (Signed)
 Pt arrives from home via [facility], per report, the pt fell on patch of ice, fell back, hit her head. No LOC, laid on floor for 45 minutes -1 hour because she could not get up. She c/o neck pain- C collar for precautions and c/o pain to the back of her head where she hit it on the ground. No blood thinner. 142/5m, hr 77, 99% ra, 98.2.

## 2023-05-22 NOTE — ED Provider Triage Note (Signed)
 Emergency Medicine Provider Triage Evaluation Note  [name] , a 48 y.o. female  was evaluated in triage.  Pt complains of slipped on ice, fell backward, hit head.  +syncope, has head/neck pain.  Review of Systems  Positive: Headache, neck pain Negative: vomiting  Physical Exam  BP (!) 159/104 (BP Location: Left Arm)   Pulse 77   Temp 97.8 F (36.6 C) (Oral)   Resp 16   SpO2 100%  Gen:   Awake, no distress    Resp:  Normal effort   MSK:   Moves extremities without difficulty   Other:     Medical Decision Making  Medically screening exam initiated at 10:22 PM.  Appropriate orders placed.  BRENLY TRAWICK was informed that the remainder of the evaluation will be completed by another provider, this initial triage assessment does not replace that evaluation, and the importance of remaining in the ED until their evaluation is complete.      Lenor Hollering, MD 05/22/23 2223

## 2023-05-23 DIAGNOSIS — S0003XA Contusion of scalp, initial encounter: Secondary | ICD-10-CM | POA: Diagnosis not present

## 2023-05-23 MED ORDER — OXYCODONE-ACETAMINOPHEN 5-325 MG PO TABS
2.0000 | ORAL_TABLET | Freq: Once | ORAL | Status: AC
  Administered 2023-05-23: 2 via ORAL
  Filled 2023-05-23: qty 2

## 2023-05-23 MED ORDER — OXYCODONE-ACETAMINOPHEN 5-325 MG PO TABS: 1.0000 | ORAL_TABLET | Freq: Three times a day (TID) | ORAL | 0 refills | Status: DC | PRN

## 2023-05-23 MED ORDER — KETOROLAC TROMETHAMINE 60 MG/2ML IM SOLN
30.0000 mg | Freq: Once | INTRAMUSCULAR | Status: AC
  Administered 2023-05-23: 30 mg via INTRAMUSCULAR
  Filled 2023-05-23: qty 2

## 2023-05-23 NOTE — ED Provider Notes (Signed)
 [place] EMERGENCY DEPARTMENT AT [facility] Provider Note   CSN: [id number] Arrival date & time: 05/22/23  2138     History  Chief Complaint  Patient presents with   [place]    [name] is a 48 y.o. female.  48 year old female presents ER today after a fall on ice.  She states she slipped fell backwards hit her head.  She states that she has had a headache since that time.  She also has some neck pain initially.  Reviewed the previous notes the previous MSE note states that she had positive loss of consciousness however patient denies it and she also denied it to triage nurse I suspect this is an error.  No nausea or vomiting.  No blood thinners.  No injuries elsewhere.  No bleeding.   Fall       Home Medications Prior to Admission medications   Medication Sig Start Date End Date Taking? Authorizing Provider  oxyCODONE -acetaminophen  (PERCOCET) 5-325 MG tablet Take 1-2 tablets by mouth every 8 (eight) hours as needed for severe pain (pain score 7-10). 05/23/23  Yes Haivyn Oravec, Selinda, MD  Alcohol  Swabs  PADS Use as directed 09/07/22   Scot Richardson SAUNDERS., MD  Aspirin -Caffeine  (BAYER BACK & BODY PO) Take 1 tablet by mouth every 6 (six) hours as needed (back pain).    [provider]  EMGALITY  120 MG/ML SOSY Inject 1 mL into the skin every 30 (thirty) days. 01/20/22   [provider]  ferrous sulfate  325 (65 FE) MG tablet Take 1 tablet (325 mg total) by mouth daily. 12/14/22   Dean Clarity, MD  gabapentin  (NEURONTIN ) 800 MG tablet Take 1 tablet (800 mg total) by mouth 3 (three) times daily. 06/16/22   Sater, Charlie LABOR, MD  Glatiramer  Acetate (GLATOPA ) 40 MG/ML SOSY Inject 1 ml (40 mg) into the skin every Monday, Wednesday, and Friday 04/07/23     Glatiramer  Acetate 40 MG/ML SOSY Inject 40 mg into the skin every Monday, Wednesday and Friday 09/07/22     imipramine  (TOFRANIL ) 25 MG tablet TAKE 1 TABLET BY MOUTH EVERYDAY AT BEDTIME 12/30/21   Sater, Charlie LABOR, MD   KESIMPTA  20 MG/0.4ML SOAJ Inject 20mg  SQ at week 0, 1, & 2. Then start maintenance dose at week 4 06/14/22   Sater, Charlie LABOR, MD  KESIMPTA  20 MG/0.4ML SOAJ Inject 0.4 mLs into the skin every 30 (thirty) days. 06/14/22   Sater, Charlie LABOR, MD  lamoTRIgine  (LAMICTAL ) 25 MG tablet Take one pill daily x 3 days, then one pill twice daily x 3 days, then one pill three times daily x 3 days, then 2 pills twice daily. 06/24/22   Sater, Charlie LABOR, MD  naproxen  (NAPROSYN ) 500 MG tablet Take 1 tablet (500 mg total) by mouth 2 (two) times daily. Patient not taking: Reported on 05/31/2022 02/09/22   Sponseller, Pleasant SAUNDERS, PA-C  predniSONE  (STERAPRED UNI-PAK 21 TAB) 10 MG (21) TBPK tablet Take by mouth daily. Take 6 tabs by mouth daily  for 2 days, then 5 tabs for 2 days, then 4 tabs for 2 days, then 3 tabs for 2 days, 2 tabs for 2 days, then 1 tab by mouth daily for 2 days 07/15/22   Silver Wonda LABOR, PA  sharps container Use as directed 09/07/22   Scot Richardson SAUNDERS., MD  SUMAtriptan  (IMITREX ) 100 MG tablet Take 1 tablet (100 mg total) by mouth once as needed for up to 1 dose for migraine. May repeat in 2 hours if  headache persists or recurs. Patient not taking: Reported on 05/31/2022 05/19/21   Lomax, Amy, NP  UBRELVY  100 MG TABS TAKE 1 TABLET (100 MG TOTAL) BY MOUTH AS NEEDED. CAN TAKE ONE ADDITIONAL DOSE AT LEAST 2 HOURS AFTER THE INITIAL DOSE IF NEEDED. MAX DAILY DOSE IS 200 MG TOTAL. 05/02/23   Sater, Charlie LABOR, MD      Allergies    Naproxen , Pork-derived products, and Latex    Review of Systems   Review of Systems  Physical Exam Updated Vital Signs BP (!) 159/104 (BP Location: Left Arm)   Pulse 77   Temp 97.8 F (36.6 C) (Oral)   Resp 16   SpO2 100%  Physical Exam Vitals and nursing note reviewed.  Constitutional:      Appearance: She is well-developed.  HENT:     Head: Normocephalic.     Comments: Contusion near occipital area of scalp, no laceration or bleeding Cardiovascular:     Rate and Rhythm:  Normal rate and regular rhythm.  Pulmonary:     Effort: No respiratory distress.     Breath sounds: No stridor.  Abdominal:     General: There is no distension.  Musculoskeletal:     Cervical back: Normal range of motion.  Skin:    General: Skin is warm and dry.  Neurological:     General: No focal deficit present.     Mental Status: She is alert.     ED Results / Procedures / Treatments   Labs (all labs ordered are listed, but only abnormal results are displayed) Labs Reviewed - No data to display  EKG None  Radiology CT Head Wo Contrast Result Date: 05/22/2023 CLINICAL DATA:  Head trauma, moderate-severe; Neck trauma, midline tenderness (Age 30-64y) Pt complains of slipped on ice, fell backward, hit head. +syncope, has head/neck pain. EXAM: CT HEAD WITHOUT CONTRAST CT CERVICAL SPINE WITHOUT CONTRAST TECHNIQUE: Multidetector CT imaging of the head and cervical spine was performed following the standard protocol without intravenous contrast. Multiplanar CT image reconstructions of the cervical spine were also generated. RADIATION DOSE REDUCTION: This exam was performed according to the departmental dose-optimization program which includes automated exposure control, adjustment of the mA and/or kV according to patient size and/or use of iterative reconstruction technique. COMPARISON:  CT head 08/30/2022, MRI cervical spine 02/08/2022 FINDINGS: CT HEAD FINDINGS Brain: No evidence of large-territorial acute infarction. No parenchymal hemorrhage. No mass lesion. No extra-axial collection. No mass effect or midline shift. No hydrocephalus. Basilar cisterns are patent. Vascular: No hyperdense vessel. Skull: No acute fracture or focal lesion. Sinuses/Orbits: Right maxillary sinus mucosal thickening. Paranasal sinuses and mastoid air cells are clear. The orbits are unremarkable. Other: None. CT CERVICAL SPINE FINDINGS Alignment: Normal. Skull base and vertebrae: No acute fracture. No aggressive  appearing focal osseous lesion or focal pathologic process. Soft tissues and spinal canal: No prevertebral fluid or swelling. No visible canal hematoma. Upper chest: Unremarkable. Other: None. IMPRESSION: 1. No acute intracranial abnormality. 2. No acute displaced fracture or traumatic listhesis of the cervical spine. Electronically Signed   By: Morgane  Naveau M.D.   On: 05/22/2023 23:58   CT Cervical Spine Wo Contrast Result Date: 05/22/2023 CLINICAL DATA:  Head trauma, moderate-severe; Neck trauma, midline tenderness (Age 30-64y) Pt complains of slipped on ice, fell backward, hit head. +syncope, has head/neck pain. EXAM: CT HEAD WITHOUT CONTRAST CT CERVICAL SPINE WITHOUT CONTRAST TECHNIQUE: Multidetector CT imaging of the head and cervical spine was performed following the standard protocol without intravenous  contrast. Multiplanar CT image reconstructions of the cervical spine were also generated. RADIATION DOSE REDUCTION: This exam was performed according to the departmental dose-optimization program which includes automated exposure control, adjustment of the mA and/or kV according to patient size and/or use of iterative reconstruction technique. COMPARISON:  CT head 08/30/2022, MRI cervical spine 02/08/2022 FINDINGS: CT HEAD FINDINGS Brain: No evidence of large-territorial acute infarction. No parenchymal hemorrhage. No mass lesion. No extra-axial collection. No mass effect or midline shift. No hydrocephalus. Basilar cisterns are patent. Vascular: No hyperdense vessel. Skull: No acute fracture or focal lesion. Sinuses/Orbits: Right maxillary sinus mucosal thickening. Paranasal sinuses and mastoid air cells are clear. The orbits are unremarkable. Other: None. CT CERVICAL SPINE FINDINGS Alignment: Normal. Skull base and vertebrae: No acute fracture. No aggressive appearing focal osseous lesion or focal pathologic process. Soft tissues and spinal canal: No prevertebral fluid or swelling. No visible canal  hematoma. Upper chest: Unremarkable. Other: None. IMPRESSION: 1. No acute intracranial abnormality. 2. No acute displaced fracture or traumatic listhesis of the cervical spine. Electronically Signed   By: Morgane  Naveau M.D.   On: 05/22/2023 23:58    Procedures Procedures    Medications Ordered in ED Medications  oxyCODONE -acetaminophen  (PERCOCET/ROXICET) 5-325 MG per tablet 2 tablet (has no administration in time range)  ketorolac  (TORADOL ) injection 30 mg (has no administration in time range)    ED Course/ Medical Decision Making/ A&P                                 Medical Decision Making Risk Prescription drug management.   Patient with some paracervical muscular soreness now but no other red flags to be concern for further workup needed at this time.  Mechanical fall without syncope no indication for EKG or labs at this time.  Overall we will treat headache as traumatic headache and give her precautions for postconcussive syndrome at home.  Daughter will drive her home.  Return here for any new or worsening symptoms.  CT scans negative for bleed or fracture on my interpretation.   Final Clinical Impression(s) / ED Diagnoses Final diagnoses:  Fall, initial encounter  Injury of head, initial encounter    Rx / DC Orders ED Discharge Orders          Ordered    oxyCODONE -acetaminophen  (PERCOCET) 5-325 MG tablet  Every 8 hours PRN        05/23/23 0021              Jaquelyne Firkus, Selinda, MD 05/23/23 9975

## 2023-06-06 ENCOUNTER — Other Ambulatory Visit: Payer: Self-pay

## 2023-06-06 NOTE — Progress Notes (Signed)
Specialty Pharmacy Refill Coordination Note  [name] is a 48 y.o. female contacted today regarding refills of specialty medication(s) Glatiramer Acetate   Patient requested Delivery   Delivery date: 06/13/23   Verified address: [address]   [place] [address]   Medication will be filled on 06/12/23.

## 2023-06-23 ENCOUNTER — Telehealth: Payer: Self-pay

## 2023-06-23 NOTE — Telephone Encounter (Signed)
*  [facility]  Pharmacy Patient Advocate Encounter   Received notification from Fax that prior authorization for Kesimpta 20MG /0.4ML auto-injectors  is required/requested.   Insurance verification completed.   The patient is insured through [facility] .   Per test claim: PA required; PA started via CoverMyMeds. KEY [id number] . Please see clinical question(s) below that I am not finding the answer to in her chart and advise.   Is patient still on the Kesimpta?

## 2023-06-26 DIAGNOSIS — D259 Leiomyoma of uterus, unspecified: Secondary | ICD-10-CM | POA: Diagnosis not present

## 2023-06-26 DIAGNOSIS — Z86018 Personal history of other benign neoplasm: Secondary | ICD-10-CM | POA: Diagnosis not present

## 2023-06-26 DIAGNOSIS — R918 Other nonspecific abnormal finding of lung field: Secondary | ICD-10-CM | POA: Diagnosis not present

## 2023-06-27 NOTE — Telephone Encounter (Signed)
 Noted-Thanks-PA will be closed out.

## 2023-06-29 ENCOUNTER — Other Ambulatory Visit: Payer: Self-pay

## 2023-07-03 ENCOUNTER — Other Ambulatory Visit: Payer: Self-pay

## 2023-07-03 ENCOUNTER — Other Ambulatory Visit: Payer: Self-pay | Admitting: Pharmacy Technician

## 2023-07-03 NOTE — Progress Notes (Signed)
 Specialty Pharmacy Refill Coordination Note  [name] is a 48 y.o. female contacted today regarding refills of specialty medication(s) Glatiramer Acetate   Patient requested (Patient-Rptd) Delivery   Delivery date: (Patient-Rptd) 07/07/23   Verified address: (Patient-Rptd) [address] [place] [address]   Medication will be filled on 07/06/23.   RR sent to MD

## 2023-07-04 ENCOUNTER — Other Ambulatory Visit: Payer: Self-pay

## 2023-07-04 ENCOUNTER — Other Ambulatory Visit (HOSPITAL_COMMUNITY): Payer: Self-pay

## 2023-07-04 NOTE — Progress Notes (Signed)
 Encounter opened in error - refill request for glatopa denied. Sent patient mychart message.

## 2023-07-05 ENCOUNTER — Telehealth: Payer: Self-pay

## 2023-07-05 NOTE — Telephone Encounter (Signed)
*  [facility]  Pharmacy Patient Advocate Encounter   Received notification from CoverMyMeds that prior authorization for Ubrelvy 100MG  tablets  is required/requested.   Insurance verification completed.   The patient is insured through [facility] .   Per test claim: PA required; PA started via CoverMyMeds. KEY [id number] . Please see clinical question(s) below that I am not finding the answer to in her chart and advise.  Is patient being seen by this practice? Chart shows patient has not been seen since 01/24 but Dr. Epimenio Foot has sent in a new script for the [facility] 12/24

## 2023-07-24 ENCOUNTER — Other Ambulatory Visit: Payer: Self-pay

## 2023-07-28 ENCOUNTER — Other Ambulatory Visit: Payer: Self-pay

## 2023-08-05 ENCOUNTER — Encounter (HOSPITAL_COMMUNITY): Payer: Self-pay

## 2023-08-05 ENCOUNTER — Emergency Department (HOSPITAL_COMMUNITY)

## 2023-08-05 ENCOUNTER — Other Ambulatory Visit: Payer: Self-pay

## 2023-08-05 ENCOUNTER — Emergency Department (HOSPITAL_COMMUNITY)
Admission: EM | Admit: 2023-08-05 | Discharge: 2023-08-05 | Disposition: A | Discharge status: Home / Self Care | Attending: Emergency Medicine | Admitting: Emergency Medicine

## 2023-08-05 DIAGNOSIS — M79604 Pain in right leg: Secondary | ICD-10-CM | POA: Insufficient documentation

## 2023-08-05 DIAGNOSIS — G35 Multiple sclerosis: Secondary | ICD-10-CM | POA: Diagnosis not present

## 2023-08-05 DIAGNOSIS — R079 Chest pain, unspecified: Secondary | ICD-10-CM | POA: Diagnosis not present

## 2023-08-05 DIAGNOSIS — Z91040 Latex allergy status: Secondary | ICD-10-CM | POA: Insufficient documentation

## 2023-08-05 DIAGNOSIS — R0602 Shortness of breath: Secondary | ICD-10-CM | POA: Insufficient documentation

## 2023-08-05 DIAGNOSIS — R059 Cough, unspecified: Secondary | ICD-10-CM | POA: Diagnosis not present

## 2023-08-05 DIAGNOSIS — R000 Tachycardia, unspecified: Secondary | ICD-10-CM | POA: Diagnosis not present

## 2023-08-05 DIAGNOSIS — R29898 Other symptoms and signs involving the musculoskeletal system: Secondary | ICD-10-CM

## 2023-08-05 DIAGNOSIS — R202 Paresthesia of skin: Secondary | ICD-10-CM | POA: Diagnosis present

## 2023-08-05 DIAGNOSIS — M79601 Pain in right arm: Secondary | ICD-10-CM | POA: Diagnosis not present

## 2023-08-05 DIAGNOSIS — R200 Anesthesia of skin: Secondary | ICD-10-CM

## 2023-08-05 LAB — URINALYSIS, ROUTINE W REFLEX MICROSCOPIC
Bacteria, UA: NONE SEEN
Bilirubin Urine: NEGATIVE
Glucose, UA: NEGATIVE mg/dL
Ketones, ur: NEGATIVE mg/dL
Leukocytes,Ua: NEGATIVE
Nitrite: NEGATIVE
Protein, ur: NEGATIVE mg/dL
Specific Gravity, Urine: 1.018 (ref 1.005–1.030)
pH: 5 (ref 5.0–8.0)

## 2023-08-05 LAB — HEPATIC FUNCTION PANEL
ALT: 13 U/L (ref 0–44)
AST: 19 U/L (ref 15–41)
Albumin: 3.2 g/dL — ABNORMAL LOW (ref 3.5–5.0)
Alkaline Phosphatase: 54 U/L (ref 38–126)
Bilirubin, Direct: <0.1 mg/dL (ref 0.0–0.2)
Total Bilirubin: <0.2 mg/dL (ref 0.0–1.2)
Total Protein: 6.7 g/dL (ref 6.5–8.1)

## 2023-08-05 LAB — BASIC METABOLIC PANEL WITH GFR
Anion gap: 6 (ref 5–15)
BUN: 7 mg/dL (ref 6–20)
CO2: 26 mmol/L (ref 22–32)
Calcium: 8.7 mg/dL — ABNORMAL LOW (ref 8.9–10.3)
Chloride: 106 mmol/L (ref 98–111)
Creatinine, Ser: 0.94 mg/dL (ref 0.44–1.00)
GFR, Estimated: >60 mL/min (ref >60)
Glucose, Bld: 108 mg/dL — ABNORMAL HIGH (ref 70–99)
Potassium: 3.6 mmol/L (ref 3.5–5.1)
Sodium: 138 mmol/L (ref 135–145)

## 2023-08-05 LAB — CBC
HCT: 36.1 % (ref 36.0–46.0)
Hemoglobin: 10.5 g/dL — ABNORMAL LOW (ref 12.0–15.0)
MCH: 20.3 pg — ABNORMAL LOW (ref 26.0–34.0)
MCHC: 29.1 g/dL — ABNORMAL LOW (ref 30.0–36.0)
MCV: 70 fL — ABNORMAL LOW (ref 80.0–100.0)
Platelets: 319 10*3/uL (ref 150–400)
RBC: 5.16 MIL/uL — ABNORMAL HIGH (ref 3.87–5.11)
RDW: 16.3 % — ABNORMAL HIGH (ref 11.5–15.5)
WBC: 5.1 10*3/uL (ref 4.0–10.5)
nRBC: 0 % (ref 0.0–0.2)

## 2023-08-05 LAB — CBG MONITORING, ED: Glucose-Capillary: 104 mg/dL — ABNORMAL HIGH (ref 70–99)

## 2023-08-05 LAB — TROPONIN I (HIGH SENSITIVITY): Troponin I (High Sensitivity): 5 ng/L (ref <18)

## 2023-08-05 MED ORDER — PREDNISONE 50 MG PO TABS
1250.0000 mg | ORAL_TABLET | Freq: Every day | ORAL | 0 refills | Status: AC
Start: 2023-08-05 — End: 2023-08-09

## 2023-08-05 MED ORDER — PANTOPRAZOLE SODIUM 40 MG PO TBEC
40.0000 mg | DELAYED_RELEASE_TABLET | Freq: Two times a day (BID) | ORAL | 0 refills | Status: AC
Start: 2023-08-05 — End: 2023-08-10

## 2023-08-05 MED ORDER — PANTOPRAZOLE SODIUM 40 MG PO TBEC
40.0000 mg | DELAYED_RELEASE_TABLET | Freq: Once | ORAL | Status: AC
  Administered 2023-08-05: 40 mg via ORAL
  Filled 2023-08-05: qty 1

## 2023-08-05 MED ORDER — IOHEXOL 350 MG/ML SOLN
75.0000 mL | Freq: Once | INTRAVENOUS | Status: AC | PRN
  Administered 2023-08-05: 75 mL via INTRAVENOUS

## 2023-08-05 MED ORDER — PREDNISONE 50 MG PO TABS
1250.0000 mg | ORAL_TABLET | Freq: Once | ORAL | Status: AC
  Administered 2023-08-05: 1250 mg via ORAL
  Filled 2023-08-05: qty 25

## 2023-08-05 MED ORDER — GADOBUTROL 1 MMOL/ML IV SOLN
10.0000 mL | Freq: Once | INTRAVENOUS | Status: AC | PRN
  Administered 2023-08-05: 10 mL via INTRAVENOUS

## 2023-08-05 NOTE — ED Notes (Signed)
 Patient transported to CT

## 2023-08-05 NOTE — Progress Notes (Signed)
 I am asked to comment on management of this patient who presented with concern for MS flare versus pseudo flare in the setting of worsening right arm symptoms in the setting of a viral infection  MRI is somewhat equivocal: Brain MRI:  1. Stable pattern of demyelination.  No enhancing plaque. 2. 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 Cervical MRI: More conspicuous dorsal cord plaques at the C4-5 and C5-6 level, but nonenhancing. Stable more subtle plaque at the C2-3 central cord.  Patient does not seem to have any recent admissions for IV steroids; last admission I can see is 2021 at which time she did seem to tolerate steroids well per discharge summary   There is clinical equipoise here as imaging when patients are experiencing spinal cord symptoms can be negative.  Given increased conspicuity of lesions and worsening symptoms I do think that 3 to 5 days of steroids would be reasonable.  Inpatient treatment is certainly reasonable but if she does not have new PT/OT needs and would prefer outpatient treatment and can confirm she has tolerated steroids well in the past, this could be managed on an outpatient basis  Recommendations: -Steroids can be given outpatient 1250 mg prednisone by mouth daily 3-5 days ([facility] 2017) per patient preference if she has tolerated steroids well in the past and does not have significant hypertension, diabetes, or psychiatric side effects  -Patient should be counseled to take the pills over several hours in the morning or crush to avoid pill obstruction -Please also give pantoprazole 40 mg BID while on steroids  -Continued outpatient follow-up with Dr. Alton Revere  12 min spent in care, majority in direct discussion with Dr. Rush Landmark who notified patient of interprofessional consultation

## 2023-08-05 NOTE — Discharge Instructions (Signed)
 Your history, exam, and evaluation today seem consistent with an MS flare causing the right arm symptoms and pain.  We spoke to neurology who recommended admission versus oral steroids at home with close neurology follow-up.  We had a shared decision-making conversation and agreed to let you try going home to take the oral steroids.  Please take the 25 tablets of 50 mg prednisone (1250mg ) daily for the next 3 to 5 days.  You received the first days medicine here.  Please also use the Protonix for your stomach twice a day for the next few days as well.  Please rest and stay hydrated and try to keep something on your stomach as well.  Please follow-up with your neurologist and your primary doctor.  If any symptoms change or worsen acutely, please return to the nearest emergency department.

## 2023-08-05 NOTE — ED Provider Notes (Signed)
 [place] EMERGENCY DEPARTMENT AT [facility] Provider Note   CSN: [id number] Arrival date & time: 08/05/23  0416     History  Chief Complaint  Patient presents with   Back Pain    SHAWNDELL [name] is a 48 y.o. female.  The history is provided by the patient and medical records.  Back Pain [name] is a 48 y.o. female who presents to the Emergency Department complaining of arm pain, chest pain.  She presents to the emergency department for evaluation of symptoms that started 2 days ago with a shooting pain and discomfort to her right arm.  She has also had some intermittent pain to her central and right chest.  She has chronic shortness of breath and is more short of breath than baseline.  She also has chronic numbness and discomfort to the right upper extremity secondary to MS that has been present for greater than 1 year.  She states this shooting discomfort is different from her baseline MS symptoms.  No associated fevers but she has been getting cold at times.  She has mild cough.  No abdominal pain, nausea or vomiting.  She also complains of pain to her right leg for the last week.  No history of DVT/PE.  She is not currently on medications for her MS.     Home Medications Prior to Admission medications   Medication Sig Start Date End Date Taking? Authorizing Provider  Alcohol Swabs PADS Use as directed 09/07/22   Iven Finn., MD  Aspirin-Caffeine (BAYER BACK & BODY PO) Take 1 tablet by mouth every 6 (six) hours as needed (back pain).    [provider]  EMGALITY 120 MG/ML SOSY Inject 1 mL into the skin every 30 (thirty) days. 01/20/22   [provider]  ferrous sulfate 325 (65 FE) MG tablet Take 1 tablet (325 mg total) by mouth daily. 12/14/22   Jacalyn Lefevre, MD  gabapentin (NEURONTIN) 800 MG tablet Take 1 tablet (800 mg total) by mouth 3 (three) times daily. 06/16/22   Sater, Pearletha Furl, MD  Glatiramer Acetate (GLATOPA) 40 MG/ML SOSY Inject 1  ml (40 mg) into the skin every Monday, Wednesday, and Friday 04/07/23     Glatiramer Acetate 40 MG/ML SOSY Inject 40 mg into the skin every Monday, Wednesday and Friday 09/07/22     imipramine (TOFRANIL) 25 MG tablet TAKE 1 TABLET BY MOUTH EVERYDAY AT BEDTIME 12/30/21   Sater, Richard A, MD  KESIMPTA 20 MG/0.4ML SOAJ Inject 20mg  SQ at week 0, 1, & 2. Then start maintenance dose at week 4 06/14/22   Sater, Pearletha Furl, MD  KESIMPTA 20 MG/0.4ML SOAJ Inject 0.4 mLs into the skin every 30 (thirty) days. 06/14/22   Sater, Pearletha Furl, MD  lamoTRIgine (LAMICTAL) 25 MG tablet Take one pill daily x 3 days, then one pill twice daily x 3 days, then one pill three times daily x 3 days, then 2 pills twice daily. 06/24/22   Sater, Pearletha Furl, MD  naproxen (NAPROSYN) 500 MG tablet Take 1 tablet (500 mg total) by mouth 2 (two) times daily. Patient not taking: Reported on 05/31/2022 02/09/22   Sponseller, Eugene Gavia, PA-C  oxyCODONE-acetaminophen (PERCOCET) 5-325 MG tablet Take 1-2 tablets by mouth every 8 (eight) hours as needed for severe pain (pain score 7-10). 05/23/23   Mesner, Barbara Cower, MD  predniSONE (STERAPRED UNI-PAK 21 TAB) 10 MG (21) TBPK tablet Take by mouth daily. Take 6 tabs by mouth daily  for  2 days, then 5 tabs for 2 days, then 4 tabs for 2 days, then 3 tabs for 2 days, 2 tabs for 2 days, then 1 tab by mouth daily for 2 days 07/15/22   Peter Garter, PA  sharps container Use as directed 09/07/22   Iven Finn., MD  SUMAtriptan (IMITREX) 100 MG tablet Take 1 tablet (100 mg total) by mouth once as needed for up to 1 dose for migraine. May repeat in 2 hours if headache persists or recurs. Patient not taking: Reported on 05/31/2022 05/19/21   Lomax, Amy, NP  UBRELVY 100 MG TABS TAKE 1 TABLET (100 MG TOTAL) BY MOUTH AS NEEDED. CAN TAKE ONE ADDITIONAL DOSE AT LEAST 2 HOURS AFTER THE INITIAL DOSE IF NEEDED. MAX DAILY DOSE IS 200 MG TOTAL. 05/02/23   Sater, Pearletha Furl, MD      Allergies    Naproxen, Pork-derived products,  and Latex    Review of Systems   Review of Systems  Musculoskeletal:  Positive for back pain.  All other systems reviewed and are negative.   Physical Exam Updated Vital Signs BP (!) 146/96 (BP Location: Left Arm)   Pulse (!) 127   Temp 98.2 F (36.8 C)   Resp 18   Ht 5\' 8"  (1.727 m)   Wt 108.9 kg   SpO2 95%   BMI 36.49 kg/m  Physical Exam Vitals and nursing note reviewed.  Constitutional:      Appearance: She is well-developed.  HENT:     Head: Normocephalic and atraumatic.  Cardiovascular:     Rate and Rhythm: Regular rhythm. Tachycardia present.     Heart sounds: No murmur heard. Pulmonary:     Effort: Pulmonary effort is normal. No respiratory distress.     Breath sounds: Normal breath sounds.  Abdominal:     Palpations: Abdomen is soft.     Tenderness: There is no abdominal tenderness. There is no guarding or rebound.  Musculoskeletal:     Comments: 2+ radial and DP pulses bilaterally.  No edema to the lower extremities.  Skin:    General: Skin is warm and dry.  Neurological:     Mental Status: She is alert and oriented to person, place, and time.     Comments: 4+ out of 5 grip strength in the right upper extremity.  5 out of 5 grip strength in the left upper extremity.  Altered sensation to light touch in the right upper extremity.  Psychiatric:        Behavior: Behavior normal.     ED Results / Procedures / Treatments   Labs (all labs ordered are listed, but only abnormal results are displayed) Labs Reviewed  BASIC METABOLIC PANEL WITH GFR - Abnormal; Notable for the following components:      Result Value   Glucose, Bld 108 (*)    Calcium 8.7 (*)    All other components within normal limits  CBC - Abnormal; Notable for the following components:   RBC 5.16 (*)    Hemoglobin 10.5 (*)    MCV 70.0 (*)    MCH 20.3 (*)    MCHC 29.1 (*)    RDW 16.3 (*)    All other components within normal limits  URINALYSIS, ROUTINE W REFLEX MICROSCOPIC - Abnormal;  Notable for the following components:   APPearance HAZY (*)    Hgb urine dipstick MODERATE (*)    All other components within normal limits  CBG MONITORING, ED - Abnormal; Notable for the  following components:   Glucose-Capillary 104 (*)    All other components within normal limits    EKG EKG Interpretation Date/Time:  Saturday August 05 2023 04:26:36 EDT Ventricular Rate:  115 PR Interval:  176 QRS Duration:  74 QT Interval:  314 QTC Calculation: 434 R Axis:   85  Text Interpretation: Sinus tachycardia T wave abnormality, consider inferior ischemia Abnormal ECG Confirmed by Tilden Fossa [phone]) on 08/05/2023 6:14:55 AM  Radiology No results found.  Procedures Procedures    Medications Ordered in ED Medications - No data to display  ED Course/ Medical Decision Making/ A&P                                 Medical Decision Making Amount and/or Complexity of Data Reviewed Labs: ordered. Radiology: ordered.   Patient with history of MS here for evaluation of right arm pain as well as some right-sided chest pain.  She does have tachycardia but is in no distress on examination.  She is well-perfused on examination with symmetric pulses.  EKG is nonischemic.  Presentation is not consistent with ACS.  Given her symptoms a CTA will be performed to rule out PE.  We also will need to obtain MRI of her brain and cervical spine to rule out acute MS flare.  Patient care transferred pending additional imaging.        Final Clinical Impression(s) / ED Diagnoses Final diagnoses:  None    Rx / DC Orders ED Discharge Orders     None         Tilden Fossa, MD 08/05/23 0730

## 2023-08-05 NOTE — ED Notes (Addendum)
 Patient transported to X-ray

## 2023-08-05 NOTE — ED Provider Notes (Signed)
 7:36 AM Care assumed from Dr. Madilyn Hook.  At time of transfer care, patient awaiting results of the study and MRI of her head and neck to look for concern etiology of the pain, and arm symptoms.  Anticipate disposition based on workup results.  12:09 PM Workup returned and CT scan does not show evidence of pulmonary embolism.  Some chronic changes were seen in regards to nodules.  MRI is also returned with stable appearing demyelination from her MS.  There was some evolution of the meningioma that will need close follow-up.  I reassessed the patient and she still has some difficulty grasping with her fist more than normal.  Will call neurology to discuss a plan but anticipate discharge for outpatient neurology follow-up.  She does report some URI symptoms, suspect a degree of viral inflammation flaring up with the symptoms.  Spoke to Dr. Iver Nestle got with neurology who reviewed the case and images.  She feels the patient would be both appropriate for admission for IV steroids versus outpatient steroids.  Patient expresses that she would like to go home and will take steroids as an outpatient.  I confirmed that patient will need to take 1250 mg of prednisone daily for the next 3 to 5 days.  Will give a first dose here.  Will also give Protonix which neurology recommend she take twice a day for the next few days.  We also gave her instructions to take them over the course of several hours in the morning or even crush them up so there is no pill obstruction.   Patient understands to follow-up with outpatient neurology and return precautions.  She had no other questions or concerns and was discharged in stable condition at this time.   Clinical Impression: 1. Multiple sclerosis exacerbation (HCC)   2. Right hand weakness   3. Right arm pain   4. Numbness of right hand     Disposition: Discharge  Condition: Good  I have discussed the results, Dx and Tx plan with the pt(& family if present). He/she/they  expressed understanding and agree(s) with the plan. Discharge instructions discussed at great length. Strict return precautions discussed and pt &/or family have verbalized understanding of the instructions. No further questions at time of discharge.    New Prescriptions   PANTOPRAZOLE (PROTONIX) 40 MG TABLET    Take 1 tablet (40 mg total) by mouth 2 (two) times daily for 5 days.   PREDNISONE (DELTASONE) 50 MG TABLET    Take 25 tablets (1,250 mg total) by mouth daily for 4 days. Per neurology, please take 25 tablets each day for the next 2 to 4 days.  They recommended you could take them over several hours in the morning or even crush them up so you do not get a pill obstruction.    Follow Up: your neurologist     Verlee Rossetti, PA-C [id number] [place] [address] [phone]        Arnetia Bronk, Canary Brim, MD 08/05/23 [phone]

## 2023-08-05 NOTE — ED Triage Notes (Signed)
 Pt states she has upper right back pain, headache, has had fatigue, and right arm numbness that started yesterday. Pt states she has had similar problems before and saw PCP for same but symptoms have worsened.

## 2023-08-05 NOTE — ED Notes (Signed)
 Patient returned from X-ray

## 2023-08-07 ENCOUNTER — Telehealth: Payer: Self-pay | Admitting: *Deleted

## 2023-08-07 NOTE — Telephone Encounter (Signed)
 Pharmacy called related to Rx: prednisone high dosage .Marland KitchenMarland KitchenEDCM reviewed chart to find Dr. Iver Nestle,  neurologist note "Steroids can be given outpatient 1250 mg prednisone by mouth daily 3-5 days [facility] 2017) per patient preference if she has tolerated steroids well in the past and does not have significant hypertension, diabetes, or psychiatric side effects"  advised to fill as written.

## 2023-08-18 ENCOUNTER — Encounter (HOSPITAL_COMMUNITY): Payer: Self-pay | Admitting: *Deleted

## 2023-08-18 ENCOUNTER — Emergency Department (HOSPITAL_COMMUNITY)
Admission: EM | Admit: 2023-08-18 | Discharge: 2023-08-19 | Disposition: A | Discharge status: Home / Self Care | Attending: Emergency Medicine | Admitting: Emergency Medicine

## 2023-08-18 ENCOUNTER — Other Ambulatory Visit: Payer: Self-pay

## 2023-08-18 DIAGNOSIS — R531 Weakness: Secondary | ICD-10-CM | POA: Insufficient documentation

## 2023-08-18 DIAGNOSIS — Z7982 Long term (current) use of aspirin: Secondary | ICD-10-CM | POA: Insufficient documentation

## 2023-08-18 DIAGNOSIS — G35 Multiple sclerosis: Secondary | ICD-10-CM | POA: Diagnosis not present

## 2023-08-18 DIAGNOSIS — Z91040 Latex allergy status: Secondary | ICD-10-CM | POA: Diagnosis not present

## 2023-08-18 DIAGNOSIS — R209 Unspecified disturbances of skin sensation: Secondary | ICD-10-CM | POA: Diagnosis present

## 2023-08-18 LAB — URINALYSIS, ROUTINE W REFLEX MICROSCOPIC
Bilirubin Urine: NEGATIVE
Glucose, UA: NEGATIVE mg/dL
Ketones, ur: 5 mg/dL — AB
Leukocytes,Ua: NEGATIVE
Nitrite: NEGATIVE
Protein, ur: NEGATIVE mg/dL
Specific Gravity, Urine: 1.02 (ref 1.005–1.030)
pH: 5 (ref 5.0–8.0)

## 2023-08-18 LAB — COMPREHENSIVE METABOLIC PANEL WITH GFR
ALT: 23 U/L (ref 0–44)
AST: 23 U/L (ref 15–41)
Albumin: 3 g/dL — ABNORMAL LOW (ref 3.5–5.0)
Alkaline Phosphatase: 57 U/L (ref 38–126)
Anion gap: 10 (ref 5–15)
BUN: 8 mg/dL (ref 6–20)
CO2: 24 mmol/L (ref 22–32)
Calcium: 8.7 mg/dL — ABNORMAL LOW (ref 8.9–10.3)
Chloride: 104 mmol/L (ref 98–111)
Creatinine, Ser: 0.72 mg/dL (ref 0.44–1.00)
GFR, Estimated: >60 mL/min (ref >60)
Glucose, Bld: 114 mg/dL — ABNORMAL HIGH (ref 70–99)
Potassium: 3.9 mmol/L (ref 3.5–5.1)
Sodium: 138 mmol/L (ref 135–145)
Total Bilirubin: 0.2 mg/dL (ref 0.0–1.2)
Total Protein: 6.3 g/dL — ABNORMAL LOW (ref 6.5–8.1)

## 2023-08-18 LAB — CBC
HCT: 35.8 % — ABNORMAL LOW (ref 36.0–46.0)
Hemoglobin: 10.4 g/dL — ABNORMAL LOW (ref 12.0–15.0)
MCH: 20.5 pg — ABNORMAL LOW (ref 26.0–34.0)
MCHC: 29.1 g/dL — ABNORMAL LOW (ref 30.0–36.0)
MCV: 70.6 fL — ABNORMAL LOW (ref 80.0–100.0)
Platelets: 361 10*3/uL (ref 150–400)
RBC: 5.07 MIL/uL (ref 3.87–5.11)
RDW: 16.9 % — ABNORMAL HIGH (ref 11.5–15.5)
WBC: 10.2 10*3/uL (ref 4.0–10.5)
nRBC: 0 % (ref 0.0–0.2)

## 2023-08-18 LAB — LIPASE, BLOOD: Lipase: 50 U/L (ref 11–51)

## 2023-08-18 NOTE — ED Triage Notes (Signed)
 The pt is c/o numbness in her rt  ahnd and numbness in her rt and lt toes  she was seen  here last week /  she has ms and she reports that they wanted to admit her but she could not be admitted  her symptoms are no better    lmp none

## 2023-08-19 NOTE — Discharge Instructions (Addendum)
 You can finish the steroids if you like, up to your discretion.  Take the protonix with it that was sent in to avoid GI upset. Follow-up with your neurologist. Recommend to follow-up with your primary care doctor in the interim. Return here for new concerns.

## 2023-08-19 NOTE — ED Provider Notes (Signed)
 [place] [facility]ENC[facility] DEPARTMENT AT [facility] Provider Note   CSN: [id number] Arrival date & time: 08/18/23  1826     History  Chief Complaint  Patient presents with   Numbness    [name] is a 48 y.o. female.  The history is provided by the patient and medical records.   48 year old female with history of MS, anxiety, depression, presenting to the ED for continued right hand numbness and weakness.  She was seen here last week and told she may be having an MS flare.  She was offered admission but was unable to stay as she was taking care of her grandkids.  She was sent home on high-dose oral steroids but was only able to complete 2 days of this due to some GI upset.  States she feels like her symptoms are getting worse, feels more weak in her right arm and is now starting to have some paresthesias of her toes.  She denies any new leg weakness.  She remains ambulatory.  Home Medications Prior to Admission medications   Medication Sig Start Date End Date Taking? Authorizing Provider  Alcohol Swabs PADS Use as directed 09/07/22   Iven Finn., MD  Aspirin-Caffeine (BAYER BACK & BODY PO) Take 1 tablet by mouth every 6 (six) hours as needed (back pain).    [provider]  EMGALITY 120 MG/ML SOSY Inject 1 mL into the skin every 30 (thirty) days. 01/20/22   [provider]  ferrous sulfate 325 (65 FE) MG tablet Take 1 tablet (325 mg total) by mouth daily. 12/14/22   Jacalyn Lefevre, MD  gabapentin (NEURONTIN) 800 MG tablet Take 1 tablet (800 mg total) by mouth 3 (three) times daily. 06/16/22   Sater, Pearletha Furl, MD  Glatiramer Acetate (GLATOPA) 40 MG/ML SOSY Inject 1 ml (40 mg) into the skin every Monday, Wednesday, and Friday 04/07/23     Glatiramer Acetate 40 MG/ML SOSY Inject 40 mg into the skin every Monday, Wednesday and Friday 09/07/22     imipramine (TOFRANIL) 25 MG tablet TAKE 1 TABLET BY MOUTH EVERYDAY AT BEDTIME 12/30/21   Sater, Richard A, MD   KESIMPTA 20 MG/0.4ML SOAJ Inject 20mg  SQ at week 0, 1, & 2. Then start maintenance dose at week 4 06/14/22   Sater, Pearletha Furl, MD  KESIMPTA 20 MG/0.4ML SOAJ Inject 0.4 mLs into the skin every 30 (thirty) days. 06/14/22   Sater, Pearletha Furl, MD  lamoTRIgine (LAMICTAL) 25 MG tablet Take one pill daily x 3 days, then one pill twice daily x 3 days, then one pill three times daily x 3 days, then 2 pills twice daily. 06/24/22   Sater, Pearletha Furl, MD  naproxen (NAPROSYN) 500 MG tablet Take 1 tablet (500 mg total) by mouth 2 (two) times daily. Patient not taking: Reported on 05/31/2022 02/09/22   Sponseller, Eugene Gavia, PA-C  oxyCODONE-acetaminophen (PERCOCET) 5-325 MG tablet Take 1-2 tablets by mouth every 8 (eight) hours as needed for severe pain (pain score 7-10). 05/23/23   Mesner, Barbara Cower, MD  pantoprazole (PROTONIX) 40 MG tablet Take 1 tablet (40 mg total) by mouth 2 (two) times daily for 5 days. 08/05/23 08/10/23  Tegeler, Canary Brim, MD  predniSONE (STERAPRED UNI-PAK 21 TAB) 10 MG (21) TBPK tablet Take by mouth daily. Take 6 tabs by mouth daily  for 2 days, then 5 tabs for 2 days, then 4 tabs for 2 days, then 3 tabs for 2 days, 2 tabs for 2 days, then 1  tab by mouth daily for 2 days 07/15/22   [place] Butter, PA  sharps container Use as directed 09/07/22   Tally Faes., MD  SUMAtriptan (IMITREX) 100 MG tablet Take 1 tablet (100 mg total) by mouth once as needed for up to 1 dose for migraine. May repeat in 2 hours if headache persists or recurs. Patient not taking: Reported on 05/31/2022 05/19/21   Lomax, Amy, NP  UBRELVY 100 MG TABS TAKE 1 TABLET (100 MG TOTAL) BY MOUTH AS NEEDED. CAN TAKE ONE ADDITIONAL DOSE AT LEAST 2 HOURS AFTER THE INITIAL DOSE IF NEEDED. MAX DAILY DOSE IS 200 MG TOTAL. 05/02/23   Sater, Sherida Dimmer, MD      Allergies    Naproxen, Pork-derived products, and Latex    Review of Systems   Review of Systems  Neurological:  Positive for weakness and numbness.  All other systems reviewed and  are negative.   Physical Exam Updated Vital Signs BP (!) 145/75 (BP Location: Right Arm)   Pulse 88   Temp 98 F (36.7 C) (Oral)   Resp 20   Ht 5\' 8"  (1.727 m)   Wt 108.9 kg   SpO2 99%   BMI 36.50 kg/m  Physical Exam Vitals and nursing note reviewed.  Constitutional:      Appearance: She is well-developed.  HENT:     Head: Normocephalic and atraumatic.  Eyes:     Conjunctiva/sclera: Conjunctivae normal.     Pupils: Pupils are equal, round, and reactive to light.  Cardiovascular:     Rate and Rhythm: Normal rate and regular rhythm.     Heart sounds: Normal heart sounds.  Pulmonary:     Effort: Pulmonary effort is normal.     Breath sounds: Normal breath sounds.  Abdominal:     General: Bowel sounds are normal.     Palpations: Abdomen is soft.  Musculoskeletal:        General: Normal range of motion.     Cervical back: Normal range of motion.  Skin:    General: Skin is warm and dry.  Neurological:     Mental Status: She is alert and oriented to person, place, and time.     Comments: AAOx3, answering questions and following commands appropriately; right grip is weak compared with left, able to move arms/legs easily on command; CN grossly intact; moves all extremities appropriately without ataxia; no facial asymmetry, speech clear and goal oriented     ED Results / Procedures / Treatments   Labs (all labs ordered are listed, but only abnormal results are displayed) Labs Reviewed  COMPREHENSIVE METABOLIC PANEL WITH GFR - Abnormal; Notable for the following components:      Result Value   Glucose, Bld 114 (*)    Calcium 8.7 (*)    Total Protein 6.3 (*)    Albumin 3.0 (*)    All other components within normal limits  CBC - Abnormal; Notable for the following components:   Hemoglobin 10.4 (*)    HCT 35.8 (*)    MCV 70.6 (*)    MCH 20.5 (*)    MCHC 29.1 (*)    RDW 16.9 (*)    All other components within normal limits  URINALYSIS, ROUTINE W REFLEX MICROSCOPIC -  Abnormal; Notable for the following components:   Hgb urine dipstick SMALL (*)    Ketones, ur 5 (*)    Bacteria, UA RARE (*)    All other components within normal limits  LIPASE, BLOOD  EKG None  Radiology No results found.  Procedures Procedures    Medications Ordered in ED Medications - No data to display  ED Course/ Medical Decision Making/ A&P                                 Medical Decision Making  12 female presenting to the ED for continued numbness and weakness in her right hand.  Now starting to develop some numbness in her toes but no weakness of the legs.  She remains ambulatory, able to drive herself to the ED today.  Only took 2 days worth of the oral prednisone from prior visit.  She is awake, alert, oriented here.  She does have some mild weakness with her right grip.  She still has normal motor function of all of her extremities.  She is ambulatory here in the ER.  She is not having difficulty swallowing.  No significant headaches or blurred vision.  Just had MRI brain and C-spine with and without contrast completed on 08/05/2023.  She does have worsening plaques but no active demyelination seen.  Will discuss with neurology for further recommendations.  2:26 AM Spoke with Dr. Murvin Arthurs-- prior MRI's without active demyelination so would not recommend admission for IV steroids.  She did complete 2 days worth of oral and goal is 3-5 so feels like this is sufficient without known demyelination.  Would just recommend outpatient follow-up with her neurologist.  Can order home PT/OT eval if she feels it is needed.    Further discussion with patient regarding recommendations.  She does not feel like she is struggling at home, still able to complete her ADLs, was even able to drive herself to the emergency room tonight and remains ambulatory here. She denies PT/OT needs. She voiced understanding of neurology recommendations.  She is not sure if she wants to complete the  steroids or not, we will leave this to her discretion.  Advised to continue PPI with these if she does take them to avoid GI upset.  She is recommended for close outpatient follow-up.  Can return here for new concern.  Patient ambulated out of the ED independently with steady gait  Final Clinical Impression(s) / ED Diagnoses Final diagnoses:  Multiple sclerosis [facility])    Rx / DC Orders ED Discharge Orders     None         Coretha Dew, PA-C 08/19/23 0440    Roberts Ching, MD 08/20/23 [phone]

## 2023-08-19 NOTE — ED Notes (Signed)
 Patient discharged in stable condition.

## 2023-09-07 ENCOUNTER — Other Ambulatory Visit: Payer: Self-pay

## 2023-09-07 MED ORDER — GLATIRAMER ACETATE 40 MG/ML ~~LOC~~ SOSY
PREFILLED_SYRINGE | SUBCUTANEOUS | 2 refills | Status: AC
Start: 2023-09-07 — End: ?
  Filled 2023-09-07: qty 12, 28d supply, fill #0

## 2023-09-07 NOTE — Progress Notes (Addendum)
 Specialty Pharmacy Refill Coordination Note  FREDRICA SCHEIDEL is a 48 y.o. female contacted today regarding refills of specialty medication(s) Glatiramer  Acetate   Patient requested Delivery   Delivery date: 09/08/23   Verified address: [address], [place], [address]   Medication will be filled on 09/07/23.     Needs PA. Routed to Tiffany.

## 2023-09-08 ENCOUNTER — Other Ambulatory Visit: Payer: Self-pay

## 2023-09-08 NOTE — Progress Notes (Signed)
 Pharmacy Patient Advocate Encounter   Received notification from Patient Pharmacy that prior authorization for Galtopa is required/requested.   Insurance verification completed.   The patient is insured through [facility] .   Per test claim: PA required; PA submitted to above mentioned insurance via CoverMyMeds Key/confirmation #/EOC [id number] Status is pending

## 2023-09-08 NOTE — Progress Notes (Signed)
PA submitted and pt notified

## 2023-09-11 ENCOUNTER — Other Ambulatory Visit: Payer: Self-pay

## 2023-09-11 ENCOUNTER — Other Ambulatory Visit (HOSPITAL_COMMUNITY): Payer: Self-pay

## 2023-09-11 NOTE — Progress Notes (Signed)
 P)A approved. Shipping 5/5. Patient notified.

## 2023-09-12 ENCOUNTER — Other Ambulatory Visit: Payer: Self-pay

## 2023-09-12 NOTE — Progress Notes (Signed)
 Pharmacy Patient Advocate Encounter  Received notification from [facility] that Prior Authorization for Glatopa  has been APPROVED from 09/08/23 to 09/07/24   PA #/Case ID/Reference #: [id number]

## 2023-09-13 ENCOUNTER — Other Ambulatory Visit: Payer: Self-pay

## 2023-09-26 ENCOUNTER — Other Ambulatory Visit: Payer: Self-pay

## 2023-09-27 ENCOUNTER — Other Ambulatory Visit: Payer: Self-pay

## 2023-10-03 ENCOUNTER — Other Ambulatory Visit: Payer: Self-pay

## 2023-10-04 ENCOUNTER — Other Ambulatory Visit: Payer: Self-pay

## 2023-10-16 DIAGNOSIS — M25562 Pain in left knee: Secondary | ICD-10-CM | POA: Diagnosis not present

## 2023-12-13 ENCOUNTER — Other Ambulatory Visit: Payer: Self-pay

## 2023-12-27 ENCOUNTER — Other Ambulatory Visit: Payer: Self-pay

## 2023-12-27 NOTE — Progress Notes (Signed)
 Disenrolled - specialty pharmacy unable to reach patient for refill and multiple attempts made by pharmacist for clinical check-in. Glatiramer  last filled 5.5.25.

## 2024-01-06 ENCOUNTER — Emergency Department (HOSPITAL_COMMUNITY)

## 2024-01-06 ENCOUNTER — Other Ambulatory Visit: Payer: Self-pay

## 2024-01-06 ENCOUNTER — Emergency Department (HOSPITAL_COMMUNITY)
Admission: EM | Admit: 2024-01-06 | Discharge: 2024-01-06 | Disposition: A | Discharge status: Home / Self Care | Attending: Emergency Medicine | Admitting: Emergency Medicine

## 2024-01-06 DIAGNOSIS — Z7982 Long term (current) use of aspirin: Secondary | ICD-10-CM | POA: Insufficient documentation

## 2024-01-06 DIAGNOSIS — D649 Anemia, unspecified: Secondary | ICD-10-CM | POA: Insufficient documentation

## 2024-01-06 DIAGNOSIS — Z91040 Latex allergy status: Secondary | ICD-10-CM | POA: Insufficient documentation

## 2024-01-06 DIAGNOSIS — Z85118 Personal history of other malignant neoplasm of bronchus and lung: Secondary | ICD-10-CM | POA: Insufficient documentation

## 2024-01-06 DIAGNOSIS — R079 Chest pain, unspecified: Secondary | ICD-10-CM | POA: Insufficient documentation

## 2024-01-06 LAB — BASIC METABOLIC PANEL WITH GFR
Anion gap: 9 (ref 5–15)
BUN: 9 mg/dL (ref 6–20)
CO2: 23 mmol/L (ref 22–32)
Calcium: 8.8 mg/dL — ABNORMAL LOW (ref 8.9–10.3)
Chloride: 107 mmol/L (ref 98–111)
Creatinine, Ser: 0.71 mg/dL (ref 0.44–1.00)
GFR, Estimated: >60 mL/min (ref >60)
Glucose, Bld: 109 mg/dL — ABNORMAL HIGH (ref 70–99)
Potassium: 3.7 mmol/L (ref 3.5–5.1)
Sodium: 139 mmol/L (ref 135–145)

## 2024-01-06 LAB — D-DIMER, QUANTITATIVE: D-Dimer, Quant: 0.64 ug{FEU}/mL — ABNORMAL HIGH (ref 0.00–0.50)

## 2024-01-06 LAB — CBC
HCT: 37.4 % (ref 36.0–46.0)
Hemoglobin: 10.9 g/dL — ABNORMAL LOW (ref 12.0–15.0)
MCH: 20.2 pg — ABNORMAL LOW (ref 26.0–34.0)
MCHC: 29.1 g/dL — ABNORMAL LOW (ref 30.0–36.0)
MCV: 69.4 fL — ABNORMAL LOW (ref 80.0–100.0)
Platelets: 349 K/uL (ref 150–400)
RBC: 5.39 MIL/uL — ABNORMAL HIGH (ref 3.87–5.11)
RDW: 16.8 % — ABNORMAL HIGH (ref 11.5–15.5)
WBC: 5.6 K/uL (ref 4.0–10.5)
nRBC: 0 % (ref 0.0–0.2)

## 2024-01-06 LAB — TROPONIN I (HIGH SENSITIVITY)
Troponin I (High Sensitivity): 10 ng/L (ref <18)
Troponin I (High Sensitivity): 9 ng/L (ref <18)

## 2024-01-06 MED ORDER — METOCLOPRAMIDE HCL 5 MG/ML IJ SOLN
10.0000 mg | Freq: Once | INTRAMUSCULAR | Status: AC
  Administered 2024-01-06: 10 mg via INTRAVENOUS
  Filled 2024-01-06: qty 2

## 2024-01-06 MED ORDER — IOHEXOL 350 MG/ML SOLN
75.0000 mL | Freq: Once | INTRAVENOUS | Status: AC | PRN
Start: 2024-01-06 — End: 2024-01-06
  Administered 2024-01-06: 75 mL via INTRAVENOUS

## 2024-01-06 MED ORDER — SODIUM CHLORIDE 0.9 % IV BOLUS
1000.0000 mL | Freq: Once | INTRAVENOUS | Status: AC
  Administered 2024-01-06: 1000 mL via INTRAVENOUS

## 2024-01-06 MED ORDER — KETOROLAC TROMETHAMINE 15 MG/ML IJ SOLN
15.0000 mg | Freq: Once | INTRAMUSCULAR | Status: AC
  Administered 2024-01-06: 15 mg via INTRAVENOUS
  Filled 2024-01-06: qty 1

## 2024-01-06 MED ORDER — DIPHENHYDRAMINE HCL 25 MG PO CAPS
25.0000 mg | ORAL_CAPSULE | Freq: Once | ORAL | Status: AC
  Administered 2024-01-06: 25 mg via ORAL
  Filled 2024-01-06: qty 1

## 2024-01-06 NOTE — ED Notes (Signed)
 Attempted to obtain IV access without success. IV Team consult placed.

## 2024-01-06 NOTE — Discharge Instructions (Addendum)
 Your workup today was reassuring.  Please continue to follow-up with your primary care doctor.  Return to the emergency department for new or concerning symptoms.

## 2024-01-06 NOTE — ED Provider Notes (Signed)
 [place] EMERGENCY DEPARTMENT AT [facility] Provider Note   CSN: [id number] Arrival date & time: 01/06/24  9443     Patient presents with: Chest Pain   [name] is a 48 y.o. female.    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  No episodes of syncope or near syncope.  She states she does feel generalized weakness over.  No exertional shortness of breath or chest pain.  She describes her chest pain as right-sided nonradiating and sharp.     Prior to Admission medications   Medication Sig Start Date End Date Taking? Authorizing Provider  Alcohol  Swabs  PADS Use as directed 09/07/22   Scot Richardson SAUNDERS., MD  Aspirin -Caffeine  (BAYER BACK & BODY PO) Take 1 tablet by mouth every 6 (six) hours as needed (back pain).    [provider]  EMGALITY  120 MG/ML SOSY Inject 1 mL into the skin every 30 (thirty) days. 01/20/22   [provider]  ferrous sulfate  325 (65 FE) MG tablet Take 1 tablet (325 mg total) by mouth daily. 12/14/22   Dean Clarity, MD  gabapentin  (NEURONTIN ) 800 MG tablet Take 1 tablet (800 mg total) by mouth 3 (three) times daily. 06/16/22   Sater, Charlie LABOR, MD  Glatiramer  Acetate 40 MG/ML SOSY Inject 40 mg into the skin every Monday, Wednesday and Friday 09/07/22     Glatiramer  Acetate 40 MG/ML SOSY Inject 1 mL (40 mg total) under the skin 3 (three) times a week. 09/07/23     imipramine  (TOFRANIL ) 25 MG tablet TAKE 1 TABLET BY MOUTH EVERYDAY AT BEDTIME 12/30/21   Sater, Charlie LABOR, MD  KESIMPTA  20 MG/0.4ML SOAJ Inject 20mg  SQ at week 0, 1, & 2. Then start maintenance dose at week 4 06/14/22   Sater, Charlie LABOR, MD  KESIMPTA  20 MG/0.4ML SOAJ  Inject 0.4 mLs into the skin every 30 (thirty) days. 06/14/22   Sater, Charlie LABOR, MD  lamoTRIgine  (LAMICTAL ) 25 MG tablet Take one pill daily x 3 days, then one pill twice daily x 3 days, then one pill three times daily x 3 days, then 2 pills twice daily. 06/24/22   Sater, Charlie LABOR, MD  naproxen  (NAPROSYN ) 500 MG tablet Take 1 tablet (500 mg total) by mouth 2 (two) times daily. Patient not taking: Reported on 05/31/2022 02/09/22   Sponseller, Pleasant SAUNDERS, PA-C  oxyCODONE -acetaminophen  (PERCOCET) 5-325 MG tablet Take 1-2 tablets by mouth every 8 (eight) hours as needed for severe pain (pain score 7-10). 05/23/23   Mesner, Selinda, MD  pantoprazole  (PROTONIX ) 40 MG tablet Take 1 tablet (40 mg total) by mouth 2 (two) times daily for 5 days. 08/05/23 08/10/23  Tegeler, Lonni PARAS, MD  predniSONE  (STERAPRED UNI-PAK 21 TAB) 10 MG (21) TBPK tablet Take by mouth daily. Take 6 tabs by mouth daily  for 2 days, then 5 tabs for 2 days, then 4 tabs for 2 days, then 3 tabs for 2 days, 2 tabs for 2 days, then 1 tab by mouth daily for 2 days 07/15/22   Silver Wonda LABOR, PA  sharps container Use as directed 09/07/22   Scot Richardson SAUNDERS., MD  SUMAtriptan  (IMITREX ) 100 MG tablet Take 1  tablet (100 mg total) by mouth once as needed for up to 1 dose for migraine. May repeat in 2 hours if headache persists or recurs. Patient not taking: Reported on 05/31/2022 05/19/21   Lomax, Amy, NP  UBRELVY  100 MG TABS TAKE 1 TABLET (100 MG TOTAL) BY MOUTH AS NEEDED. CAN TAKE ONE ADDITIONAL DOSE AT LEAST 2 HOURS AFTER THE INITIAL DOSE IF NEEDED. MAX DAILY DOSE IS 200 MG TOTAL. 05/02/23   Sater, Charlie LABOR, MD    Allergies: Naproxen , Pork-derived products, and Latex    Review of Systems  Cardiovascular:  Positive for chest pain.    Updated Vital Signs BP (!) 155/99   Pulse 78   Temp 98.5 F (36.9 C)   Resp 16   SpO2 100%   Physical Exam Vitals and nursing note reviewed.  Constitutional:      General: She is not in acute distress.     Comments: Pleasant 48 year old female in hijab in no acute distress, able to answer Qs and follow commands.   HENT:     Head: Normocephalic and atraumatic.     Nose: Nose normal.     Mouth/Throat:     Mouth: Mucous membranes are moist.  Eyes:     General: No scleral icterus. Cardiovascular:     Rate and Rhythm: Normal rate and regular rhythm.     Pulses: Normal pulses.     Heart sounds: Normal heart sounds.  Pulmonary:     Effort: Pulmonary effort is normal. No respiratory distress.     Breath sounds: No wheezing.  Abdominal:     Palpations: Abdomen is soft.     Tenderness: There is no abdominal tenderness. There is no guarding or rebound.  Musculoskeletal:     Cervical back: Normal range of motion.     Right lower leg: No edema.     Left lower leg: No edema.  Skin:    General: Skin is warm and dry.     Capillary Refill: Capillary refill takes less than 2 seconds.  Neurological:     Mental Status: She is alert. Mental status is at baseline.  Psychiatric:        Mood and Affect: Mood normal.        Behavior: Behavior normal.     (all labs ordered are listed, but only abnormal results are displayed) Labs Reviewed  BASIC METABOLIC PANEL WITH GFR - Abnormal; Notable for the following components:      Result Value   Glucose, Bld 109 (*)    Calcium 8.8 (*)    All other components within normal limits  CBC - Abnormal; Notable for the following components:   RBC 5.39 (*)    Hemoglobin 10.9 (*)    MCV 69.4 (*)    MCH 20.2 (*)    MCHC 29.1 (*)    RDW 16.8 (*)    All other components within normal limits  D-DIMER, QUANTITATIVE - Abnormal; Notable for the following components:   D-Dimer, Quant 0.64 (*)    All other components within normal limits  TROPONIN I (HIGH SENSITIVITY)  TROPONIN I (HIGH SENSITIVITY)    EKG: EKG Interpretation Date/Time:  Saturday January 06 2024 06:02:26 EDT Ventricular Rate:  87 PR Interval:  156 QRS Duration:  78 QT Interval:  370 QTC  Calculation: 445 R Axis:   82  Text Interpretation: Normal sinus rhythm Normal ECG When compared with ECG of 05-Aug-2023 04:26, HEART RATE has decreased Confirmed by Raford Lenis ([id number]) on 01/06/2024  6:13:12 AM  Radiology: DG Chest 2 View Result Date: 01/06/2024 CLINICAL DATA:  48 year old female with chest pain. History of left lung wedge resections for pulmonary nodules. EXAM: CHEST - 2 VIEW COMPARISON:  CT chest 12/24/2023 and earlier. FINDINGS: PA and lateral view 0618 hours. Mildly reduced left lung volume, curvilinear lower lung scarring is stable from March this year. Normal cardiac size and mediastinal contours. Visualized tracheal air column is within normal limits. No pneumothorax, pulmonary edema, pleural effusion or acute lung opacity. No acute osseous abnormality identified. Negative visible bowel gas. IMPRESSION: No acute cardiopulmonary abnormality. Chronic postoperative changes to the left lung. Electronically Signed   By: VEAR Hurst M.D.   On: 01/06/2024 06:32     .Ultrasound ED Peripheral IV (Provider)  Date/Time: 01/06/2024 10:58 AM  Performed by: Neldon Hamp RAMAN, PA Authorized by: Neldon Hamp RAMAN, PA   Procedure details:    Indications: multiple failed IV attempts and poor IV access     Skin Prep: chlorhexidine gluconate     Location:  Left AC   Angiocath:  20 G   Bedside Ultrasound Guided: Yes     Images: archived     Patient tolerated procedure without complications: Yes     Dressing applied: Yes   Comments:     Ultrasound-guided left upper extremity IV placed by me.  Placement confirmed with ultrasound evaluated flush.    Medications Ordered in the ED  metoCLOPramide  (REGLAN ) injection 10 mg (10 mg Intravenous Given 01/06/24 0920)  diphenhydrAMINE  (BENADRYL ) capsule 25 mg (25 mg Oral Given 01/06/24 0920)  ketorolac  (TORADOL ) 15 MG/ML injection 15 mg (15 mg Intravenous Given 01/06/24 0920)  sodium chloride  0.9 % bolus 1,000 mL (1,000 mLs Intravenous New Bag/Given  01/06/24 0923)    Clinical Course as of 01/06/24 1101  Sat Jan 06, 2024  0734 IMPRESSION:  1. Stable surgical changes of left upper and left lower wedge  resections.  2. Scattered pulmonary nodules are stable from prior examination.  3. Small pericardial effusion is increased from prior, consider  further evaluation by echocardiography..   [WF]  0740 2 weeks of intermittent CP, pleuritic More so at night when lying down.  [WF]    Clinical Course User Index [WF] Neldon Hamp RAMAN, PA                                 Medical Decision Making Amount and/or Complexity of Data Reviewed Labs: ordered. Radiology: ordered.  Risk Prescription drug management.   This patient presents to the ED for concern of CP, this involves a number of treatment options, and is a complaint that carries with it a moderate to high risk of complications and morbidity. A differential diagnosis was considered for the patient's symptoms which is discussed below:   The emergent causes of chest pain include: Acute coronary syndrome, tamponade, pericarditis/myocarditis, aortic dissection, pulmonary embolism, tension pneumothorax, pneumonia, and esophageal rupture.   I do not believe the patient has an emergent cause of chest pain, other urgent/non-acute considerations include, but are not limited to: chronic angina, aortic stenosis, cardiomyopathy, mitral valve prolapse, pulmonary hypertension, aortic insufficiency, right ventricular hypertrophy, pleuritis, bronchitis, pneumothorax, tumor, gastroesophageal reflux disease (GERD), esophageal spasm, Mallory-Weiss syndrome, peptic ulcer disease, pancreatitis, functional gastrointestinal pain, cervical or thoracic disk disease or arthritis, shoulder arthritis, costochondritis, subacromial bursitis, anxiety or panic attack, herpes zoster, breast disorders, chest wall tumors, thoracic outlet syndrome, mediastinitis.    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  No episodes of syncope or near syncope.  She states she does feel generalized weakness over.  No exertional shortness of breath or chest pain.  She describes her chest pain as right-sided nonradiating and sharp.    EMR reviewed including pt PMHx, past surgical history and past visits to ER.   See HPI for more details   Lab Tests:   Dimer elevated CT PE study ordered, CBC with chronic anemia troponin normal, BmP unremarkable   Imaging Studies:  NAD. I personally reviewed all imaging studies and no acute abnormality found. I agree with radiology interpretation. CT PE study shows stable pulmonary nodules No pericardial effusion noted, compared to last CT PE study which showed pericardial effusion.  Cardiac Monitoring:  The patient was maintained on a cardiac monitor.  I personally viewed and interpreted the cardiac monitored which showed an underlying rhythm of: NSR EKG non-ischemic   Medicines ordered:  I ordered medication including Reglan , Benadryl , Toradol , normal saline for chest pain, nausea, headache Reevaluation of the patient after these medicines showed that the patient improved I have reviewed the patients home medicines and have made adjustments as needed   Critical Interventions:     Consults/Attending Physician      Reevaluation:  After the interventions noted above I re-evaluated patient and found that they have :improved   Social Determinants of Health:      Problem List / ED Course:  Patient feeling much improved after workup here in the emergency department.  Negative PE study and I  doubt patient symptoms represent ACS given description and chronology.  Will discharge home with outpatient follow-up.  She states she has no chest pain currently.   Dispostion:  After consideration of the diagnostic results and the patients response to treatment, I feel that the patent would benefit from outpatient follow-up  Final diagnoses:  Nonspecific chest pain    ED Discharge Orders     None          Neldon Hamp RAMAN, [place] 01/06/24 1604    Francesca Elsie CROME, MD 01/06/24 [phone]

## 2024-01-06 NOTE — ED Triage Notes (Signed)
 Patient reports intermittent pain across chest with SOB for several days , no emesis or diarrhea , denies fever or chill s, hypertensive at triage .

## 2024-01-06 NOTE — ED Notes (Signed)
 IV that IV team placed would not flush or pull upon assessment.

## 2024-03-04 DIAGNOSIS — M25462 Effusion, left knee: Secondary | ICD-10-CM | POA: Diagnosis not present

## 2024-05-16 NOTE — Telephone Encounter (Addendum)
 Dr. Abou Zeid,   The preventive Nurtec was denied by the plan for multiple reasons noted below. Notes state she has taken Emaglity in the past without benefit but the plan requires she also trial Ajovy and Aimovig before Nurtec will be covered. Do you think she may consider one of those agents? I can call her to discuss the denial and options if you are amenable.   We should still be able to approve the Nurtec in addition, but for acute use.   Thank you, Camie

## 2024-05-16 NOTE — Telephone Encounter (Signed)
 Medication Access Center Summary  PA Status Denied  Medication Nurtec 75MG  dispersible tablets   Action Item - Appeal Info Phone number: [phone] Fax number: [phone] Member ID: [id number] Ref#: [id number]  Reason for Denial Your doctor must tell us  the following: You do not have headaches from medicine over-use [facility]), also known as a rebound headache You have had 4 or more migraine days a month for at least 3 months You are using other ways to help prevent migraines (such as therapy and lifestyle changes) You will not be taking this drug with a strong CYP3A4 inhibitor (a type of drug that could increase the amount of the drug asked for in your body to an unsafe level. Ask your doctor if you take one of these drugs) You do not have end-stage kidney disease (This is when your creatinine clearance (CrCl) less than 15 mL/min, this is a measurement which tells your doctor how well your kidneys are working) You must meet the trial and failure rule found on the [place]  Medicaid Preferred Drug List (PDL).  Our records show you have tried one preferred drug and need to try one more preferred drug You must try and fail or have your doctor provide a medical reason why you cannot take one of the following preferred drugs: Aimovig, [employer]  Provider Abou Zeid, [place]    Denial letter saved to Media tab     Gayl Gardiner [facility] Phone: [phone]

## 2024-05-23 NOTE — Telephone Encounter (Signed)
 Sent [facility] portal message to the patient to notify her of the denial and options for Aimovig or Ajovy.

## 2024-05-24 NOTE — Care Plan (Signed)
" °  Problem: Other resource needs Goal: Community resources provided Outcome: Progressing Note:  CHW made bi-weekly Rhythm B Study patient outreach. Patient doing well and continues to check her blood pressure. Patient has struggled with connecting with pharmacy due to her work schedule but has a pharmacologist visit 05/31/24 at 8:30am. Patient expressed no social needs or concerns for CHW to address.    "

## 2024-05-29 ENCOUNTER — Emergency Department (HOSPITAL_COMMUNITY)
Admission: EM | Admit: 2024-05-29 | Discharge: 2024-05-29 | Disposition: A | Discharge status: Home / Self Care | Attending: Emergency Medicine | Admitting: Emergency Medicine

## 2024-05-29 ENCOUNTER — Other Ambulatory Visit: Payer: Self-pay

## 2024-05-29 DIAGNOSIS — R109 Unspecified abdominal pain: Secondary | ICD-10-CM | POA: Diagnosis not present

## 2024-05-29 DIAGNOSIS — R42 Dizziness and giddiness: Secondary | ICD-10-CM | POA: Diagnosis present

## 2024-05-29 DIAGNOSIS — R110 Nausea: Secondary | ICD-10-CM | POA: Diagnosis not present

## 2024-05-29 DIAGNOSIS — Z91040 Latex allergy status: Secondary | ICD-10-CM | POA: Diagnosis not present

## 2024-05-29 LAB — BASIC METABOLIC PANEL WITH GFR
Anion gap: 8 (ref 5–15)
BUN: 11 mg/dL (ref 6–20)
CO2: 26 mmol/L (ref 22–32)
Calcium: 9.1 mg/dL (ref 8.9–10.3)
Chloride: 106 mmol/L (ref 98–111)
Creatinine, Ser: 0.76 mg/dL (ref 0.44–1.00)
GFR, Estimated: >60 mL/min
Glucose, Bld: 115 mg/dL — ABNORMAL HIGH (ref 70–99)
Potassium: 4.3 mmol/L (ref 3.5–5.1)
Sodium: 139 mmol/L (ref 135–145)

## 2024-05-29 LAB — CBC WITH DIFFERENTIAL/PLATELET
Abs Immature Granulocytes: 0.04 K/uL (ref 0.00–0.07)
Basophils Absolute: 0 K/uL (ref 0.0–0.1)
Basophils Relative: 0 %
Eosinophils Absolute: 0.1 K/uL (ref 0.0–0.5)
Eosinophils Relative: 1 %
HCT: 38.6 % (ref 36.0–46.0)
Hemoglobin: 11 g/dL — ABNORMAL LOW (ref 12.0–15.0)
Immature Granulocytes: 1 %
Lymphocytes Relative: 14 %
Lymphs Abs: 1.2 K/uL (ref 0.7–4.0)
MCH: 20.3 pg — ABNORMAL LOW (ref 26.0–34.0)
MCHC: 28.5 g/dL — ABNORMAL LOW (ref 30.0–36.0)
MCV: 71.1 fL — ABNORMAL LOW (ref 80.0–100.0)
Monocytes Absolute: 0.2 K/uL (ref 0.1–1.0)
Monocytes Relative: 3 %
Neutro Abs: 6.8 K/uL (ref 1.7–7.7)
Neutrophils Relative %: 81 %
Platelets: 346 K/uL (ref 150–400)
RBC: 5.43 MIL/uL — ABNORMAL HIGH (ref 3.87–5.11)
RDW: 16.7 % — ABNORMAL HIGH (ref 11.5–15.5)
WBC: 8.4 K/uL (ref 4.0–10.5)
nRBC: 0 % (ref 0.0–0.2)

## 2024-05-29 MED ORDER — SODIUM CHLORIDE 0.9 % IV BOLUS
1000.0000 mL | Freq: Once | INTRAVENOUS | Status: AC
  Administered 2024-05-29: 1000 mL via INTRAVENOUS

## 2024-05-29 MED ORDER — SODIUM CHLORIDE 0.9 % IV SOLN: INTRAVENOUS | Status: DC

## 2024-05-29 NOTE — ED Triage Notes (Signed)
 Patient reports I was at work and started to feel hot, like I'm seasick. Every time I walk I'm nauseous, c/o abdominal pain. Denies fever/cough/LOC/headache. Patient is alert and oriented x 4. Airway patent, respirations even and unlabored. Skin normal, warm and dry.

## 2024-05-29 NOTE — Discharge Instructions (Signed)
 Follow-up with your doctor for your sleep study

## 2024-05-29 NOTE — ED Provider Notes (Signed)
 " [place] EMERGENCY DEPARTMENT AT [facility] Provider Note   CSN: [id number] Arrival date & time: 05/29/24  1035     Patient presents with: Nausea and Abdominal Pain   [name] is a 49 y.o. female.   49 year old female who presents after feeling lightheaded with nausea while today at home.  Patient does work from home.  States that she has had these episodes of falling asleep for several months.  Scheduled to have a sleep study in near future.  States when she wakes in the morning she does not feel rested.  Is unsure if she has sleep apnea.  Denies any new medication changes.  States that she had nausea today when she was at home today with some abdominal discomfort.  No emesis.  Feels better at this time       Prior to Admission medications  Medication Sig Start Date End Date Taking? Authorizing Provider  Alcohol  Swabs  PADS Use as directed 09/07/22   Scot Richardson SAUNDERS., MD  Aspirin -Caffeine  (BAYER BACK & BODY PO) Take 1 tablet by mouth every 6 (six) hours as needed (back pain).    [provider]  EMGALITY  120 MG/ML SOSY Inject 1 mL into the skin every 30 (thirty) days. 01/20/22   [provider]  ferrous sulfate  325 (65 FE) MG tablet Take 1 tablet (325 mg total) by mouth daily. 12/14/22   Dean Clarity, MD  gabapentin  (NEURONTIN ) 800 MG tablet Take 1 tablet (800 mg total) by mouth 3 (three) times daily. 06/16/22   Sater, Charlie LABOR, MD  Glatiramer  Acetate 40 MG/ML SOSY Inject 40 mg into the skin every Monday, Wednesday and Friday 09/07/22     imipramine  (TOFRANIL ) 25 MG tablet TAKE 1 TABLET BY MOUTH EVERYDAY AT BEDTIME 12/30/21   Sater, Charlie LABOR, MD  KESIMPTA  20 MG/0.4ML SOAJ Inject 20mg  SQ at week 0, 1, & 2. Then start maintenance dose at week 4 06/14/22   Sater, Charlie LABOR, MD  KESIMPTA  20 MG/0.4ML SOAJ Inject 0.4 mLs into the skin every 30 (thirty) days. 06/14/22   Sater, Charlie LABOR, MD  lamoTRIgine  (LAMICTAL ) 25 MG tablet Take one pill daily x 3 days, then  one pill twice daily x 3 days, then one pill three times daily x 3 days, then 2 pills twice daily. 06/24/22   Sater, Charlie LABOR, MD  naproxen  (NAPROSYN ) 500 MG tablet Take 1 tablet (500 mg total) by mouth 2 (two) times daily. Patient not taking: Reported on 05/31/2022 02/09/22   Sponseller, Pleasant SAUNDERS, PA-C  oxyCODONE -acetaminophen  (PERCOCET) 5-325 MG tablet Take 1-2 tablets by mouth every 8 (eight) hours as needed for severe pain (pain score 7-10). 05/23/23   Mesner, Selinda, MD  pantoprazole  (PROTONIX ) 40 MG tablet Take 1 tablet (40 mg total) by mouth 2 (two) times daily for 5 days. 08/05/23 08/10/23  Tegeler, Lonni PARAS, MD  predniSONE  (STERAPRED UNI-PAK 21 TAB) 10 MG (21) TBPK tablet Take by mouth daily. Take 6 tabs by mouth daily  for 2 days, then 5 tabs for 2 days, then 4 tabs for 2 days, then 3 tabs for 2 days, 2 tabs for 2 days, then 1 tab by mouth daily for 2 days 07/15/22   Silver Wonda LABOR, PA  sharps container Use as directed 09/07/22   Scot Richardson SAUNDERS., MD  SUMAtriptan  (IMITREX ) 100 MG tablet Take 1 tablet (100 mg total) by mouth once as needed for up to 1 dose for migraine. May repeat in 2 hours if  headache persists or recurs. Patient not taking: Reported on 05/31/2022 05/19/21   Lomax, Amy, NP  UBRELVY  100 MG TABS TAKE 1 TABLET (100 MG TOTAL) BY MOUTH AS NEEDED. CAN TAKE ONE ADDITIONAL DOSE AT LEAST 2 HOURS AFTER THE INITIAL DOSE IF NEEDED. MAX DAILY DOSE IS 200 MG TOTAL. 05/02/23   Sater, Charlie LABOR, MD    Allergies: Naproxen , Porcine (pork) protein-containing drug products, and Latex    Review of Systems  All other systems reviewed and are negative.   Updated Vital Signs BP (!) 177/108 (BP Location: Left Arm)   Pulse 85   Temp (!) 97.2 F (36.2 C) (Oral)   Resp 16   SpO2 99%   Physical Exam Vitals and nursing note reviewed.  Constitutional:      General: She is not in acute distress.    Appearance: Normal appearance. She is well-developed. She is not toxic-appearing.  HENT:      Head: Normocephalic and atraumatic.  Eyes:     General: Lids are normal.     Conjunctiva/sclera: Conjunctivae normal.     Pupils: Pupils are equal, round, and reactive to light.  Neck:     Thyroid: No thyroid mass.     Trachea: No tracheal deviation.  Cardiovascular:     Rate and Rhythm: Normal rate and regular rhythm.     Heart sounds: Normal heart sounds. No murmur heard.    No gallop.  Pulmonary:     Effort: Pulmonary effort is normal. No respiratory distress.     Breath sounds: Normal breath sounds. No stridor. No decreased breath sounds, wheezing, rhonchi or rales.  Abdominal:     General: There is no distension.     Palpations: Abdomen is soft.     Tenderness: There is no abdominal tenderness. There is no rebound.  Musculoskeletal:        General: No tenderness. Normal range of motion.     Cervical back: Normal range of motion and neck supple.  Skin:    General: Skin is warm and dry.     Findings: No abrasion or rash.  Neurological:     General: No focal deficit present.     Mental Status: She is alert and oriented to person, place, and time. Mental status is at baseline.     GCS: GCS eye subscore is 4. GCS verbal subscore is 5. GCS motor subscore is 6.     Cranial Nerves: No cranial nerve deficit.     Sensory: No sensory deficit.     Motor: Motor function is intact.  Psychiatric:        Attention and Perception: Attention normal.        Speech: Speech normal.        Behavior: Behavior normal.     (all labs ordered are listed, but only abnormal results are displayed) Labs Reviewed  CBC WITH DIFFERENTIAL/PLATELET  BASIC METABOLIC PANEL WITH GFR    EKG: EKG Interpretation Date/Time:  Wednesday May 29 2024 12:06:35 EST Ventricular Rate:  68 PR Interval:  161 QRS Duration:  84 QT Interval:  415 QTC Calculation: 442 R Axis:   76  Text Interpretation: Sinus rhythm ST elev, probable normal early repol pattern Confirmed by Dasie Faden ([id number]) on 05/29/2024  1:17:00 PM  Radiology: No results found.   Procedures   Medications Ordered in the ED  0.9 %  sodium chloride  infusion (has no administration in time range)  sodium chloride  0.9 % bolus 1,000 mL (has no administration in  time range)                                    Medical Decision Making Amount and/or Complexity of Data Reviewed Labs: ordered. ECG/medicine tests: ordered.  Risk Prescription drug management.   The patient is EKG shows normal sinus rhythm.  Labs are reassuring..  Unclear of patient's etiology of her excessive sleepiness.  She is scheduled for sleep study.  Do not feel that she would benefit from intracranial imaging at this time.  Her abdomen exam is benign.  No indication for abdominal imaging.  Will discharge home     Final diagnoses:  None    ED Discharge Orders     None          Dasie Faden, MD 05/29/24 1336  "

## 2024-06-07 NOTE — Progress Notes (Signed)
 CHW made first attempt for bi-weekly Rhythm B Study patient outreach. Unable to reach but will try again next week.

## 2024-06-10 NOTE — Progress Notes (Signed)
 CHW made first attempt for bi-weekly Rhythm B Study patient outreach. Unable to reach patient but left a message.

## 2024-06-11 ENCOUNTER — Other Ambulatory Visit: Payer: Self-pay

## 2024-06-11 ENCOUNTER — Emergency Department (HOSPITAL_COMMUNITY)

## 2024-06-11 ENCOUNTER — Encounter (HOSPITAL_COMMUNITY): Payer: Self-pay

## 2024-06-11 ENCOUNTER — Observation Stay (HOSPITAL_COMMUNITY): Admission: EM | Admit: 2024-06-11 | Source: Home / Self Care | Attending: Family Medicine | Admitting: Family Medicine

## 2024-06-11 DIAGNOSIS — G35D Multiple sclerosis, unspecified: Principal | ICD-10-CM | POA: Diagnosis present

## 2024-06-11 DIAGNOSIS — L2084 Intrinsic (allergic) eczema: Secondary | ICD-10-CM | POA: Diagnosis present

## 2024-06-11 DIAGNOSIS — I951 Orthostatic hypotension: Secondary | ICD-10-CM

## 2024-06-11 DIAGNOSIS — R200 Anesthesia of skin: Secondary | ICD-10-CM | POA: Diagnosis present

## 2024-06-11 DIAGNOSIS — R531 Weakness: Secondary | ICD-10-CM

## 2024-06-11 HISTORY — DX: Essential (primary) hypertension: I10

## 2024-06-11 LAB — MAGNESIUM
Magnesium: 1.9 mg/dL (ref 1.7–2.4)
Magnesium: 2.1 mg/dL (ref 1.7–2.4)

## 2024-06-11 LAB — T4, FREE: Free T4: 0.88 ng/dL (ref 0.80–2.00)

## 2024-06-11 LAB — CBC WITH DIFFERENTIAL/PLATELET
Abs Immature Granulocytes: 0.03 10*3/uL (ref 0.00–0.07)
Basophils Absolute: 0 10*3/uL (ref 0.0–0.1)
Basophils Relative: 0 %
Eosinophils Absolute: 0.1 10*3/uL (ref 0.0–0.5)
Eosinophils Relative: 1 %
HCT: 37.3 % (ref 36.0–46.0)
Hemoglobin: 10.4 g/dL — ABNORMAL LOW (ref 12.0–15.0)
Immature Granulocytes: 0 %
Lymphocytes Relative: 11 %
Lymphs Abs: 0.9 10*3/uL (ref 0.7–4.0)
MCH: 19.7 pg — ABNORMAL LOW (ref 26.0–34.0)
MCHC: 27.9 g/dL — ABNORMAL LOW (ref 30.0–36.0)
MCV: 70.6 fL — ABNORMAL LOW (ref 80.0–100.0)
Monocytes Absolute: 0.3 10*3/uL (ref 0.1–1.0)
Monocytes Relative: 4 %
Neutro Abs: 7.3 10*3/uL (ref 1.7–7.7)
Neutrophils Relative %: 84 %
Platelets: 349 10*3/uL (ref 150–400)
RBC: 5.28 MIL/uL — ABNORMAL HIGH (ref 3.87–5.11)
RDW: 16.7 % — ABNORMAL HIGH (ref 11.5–15.5)
WBC: 8.6 10*3/uL (ref 4.0–10.5)
nRBC: 0 % (ref 0.0–0.2)

## 2024-06-11 LAB — URINALYSIS, ROUTINE W REFLEX MICROSCOPIC
Bilirubin Urine: NEGATIVE
Glucose, UA: NEGATIVE mg/dL
Ketones, ur: NEGATIVE mg/dL
Leukocytes,Ua: NEGATIVE
Nitrite: NEGATIVE
Protein, ur: NEGATIVE mg/dL
Specific Gravity, Urine: 1.016 (ref 1.005–1.030)
pH: 7 (ref 5.0–8.0)

## 2024-06-11 LAB — COMPREHENSIVE METABOLIC PANEL WITH GFR
ALT: 10 U/L (ref 0–44)
AST: 18 U/L (ref 15–41)
Albumin: 4 g/dL (ref 3.5–5.0)
Alkaline Phosphatase: 76 U/L (ref 38–126)
Anion gap: 9 (ref 5–15)
BUN: 9 mg/dL (ref 6–20)
CO2: 26 mmol/L (ref 22–32)
Calcium: 9 mg/dL (ref 8.9–10.3)
Chloride: 103 mmol/L (ref 98–111)
Creatinine, Ser: 0.88 mg/dL (ref 0.44–1.00)
GFR, Estimated: >60 mL/min
Glucose, Bld: 107 mg/dL — ABNORMAL HIGH (ref 70–99)
Potassium: 3.8 mmol/L (ref 3.5–5.1)
Sodium: 138 mmol/L (ref 135–145)
Total Bilirubin: 0.4 mg/dL (ref 0.0–1.2)
Total Protein: 7.1 g/dL (ref 6.5–8.1)

## 2024-06-11 LAB — PHOSPHORUS: Phosphorus: 2.7 mg/dL (ref 2.5–4.6)

## 2024-06-11 LAB — LIPASE, BLOOD: Lipase: 30 U/L (ref 11–51)

## 2024-06-11 LAB — TSH: TSH: 0.319 u[IU]/mL — ABNORMAL LOW (ref 0.350–4.500)

## 2024-06-11 LAB — TROPONIN T, HIGH SENSITIVITY
Troponin T High Sensitivity: <6 ng/L (ref 0–19)
Troponin T High Sensitivity: <6 ng/L (ref 0–19)

## 2024-06-11 LAB — HIV ANTIBODY (ROUTINE TESTING W REFLEX): HIV Screen 4th Generation wRfx: NONREACTIVE

## 2024-06-11 LAB — PRO BRAIN NATRIURETIC PEPTIDE: Pro Brain Natriuretic Peptide: 251 pg/mL

## 2024-06-11 MED ORDER — ACETAMINOPHEN 650 MG RE SUPP: 650.0000 mg | Freq: Four times a day (QID) | RECTAL | Status: AC | PRN

## 2024-06-11 MED ORDER — OFATUMUMAB 20 MG/0.4ML ~~LOC~~ SOAJ: 0.4000 mL | SUBCUTANEOUS | Status: DC

## 2024-06-11 MED ORDER — ONDANSETRON HCL 4 MG PO TABS: 4.0000 mg | ORAL_TABLET | Freq: Four times a day (QID) | ORAL | Status: AC | PRN

## 2024-06-11 MED ORDER — SODIUM CHLORIDE 0.9% FLUSH
3.0000 mL | Freq: Two times a day (BID) | INTRAVENOUS | Status: AC
  Administered 2024-06-11 – 2024-06-14 (×5): 3 mL via INTRAVENOUS

## 2024-06-11 MED ORDER — ACETAMINOPHEN 325 MG PO TABS
650.0000 mg | ORAL_TABLET | Freq: Four times a day (QID) | ORAL | Status: AC | PRN
  Administered 2024-06-12: 650 mg via ORAL
  Filled 2024-06-11: qty 2

## 2024-06-11 MED ORDER — AMLODIPINE BESYLATE 5 MG PO TABS
10.0000 mg | ORAL_TABLET | Freq: Once | ORAL | Status: AC
  Administered 2024-06-11: 10 mg via ORAL
  Filled 2024-06-11: qty 2

## 2024-06-11 MED ORDER — RIVAROXABAN 10 MG PO TABS
10.0000 mg | ORAL_TABLET | Freq: Every day | ORAL | Status: AC
  Administered 2024-06-11 – 2024-06-14 (×4): 10 mg via ORAL
  Filled 2024-06-11 (×5): qty 1

## 2024-06-11 MED ORDER — ONDANSETRON HCL 4 MG/2ML IJ SOLN
4.0000 mg | Freq: Four times a day (QID) | INTRAMUSCULAR | Status: AC | PRN
  Administered 2024-06-13: 4 mg via INTRAVENOUS
  Filled 2024-06-11: qty 2

## 2024-06-11 MED ORDER — HYDRALAZINE HCL 20 MG/ML IJ SOLN
10.0000 mg | INTRAMUSCULAR | Status: AC | PRN
  Administered 2024-06-12 – 2024-06-14 (×3): 10 mg via INTRAVENOUS
  Filled 2024-06-11 (×3): qty 1

## 2024-06-11 MED ORDER — HEPARIN SODIUM (PORCINE) 5000 UNIT/ML IJ SOLN: 5000.0000 [IU] | Freq: Three times a day (TID) | INTRAMUSCULAR | Status: DC

## 2024-06-11 MED ORDER — DIPHENHYDRAMINE HCL 50 MG/ML IJ SOLN
12.5000 mg | Freq: Once | INTRAMUSCULAR | Status: AC
  Administered 2024-06-11: 12.5 mg via INTRAVENOUS
  Filled 2024-06-11: qty 1

## 2024-06-11 MED ORDER — SODIUM CHLORIDE 0.9 % IV BOLUS
1000.0000 mL | Freq: Once | INTRAVENOUS | Status: AC
  Administered 2024-06-11: 1000 mL via INTRAVENOUS

## 2024-06-11 MED ORDER — FLEET ENEMA RE ENEM: 1.0000 | ENEMA | Freq: Once | RECTAL | Status: AC | PRN

## 2024-06-11 MED ORDER — OXYCODONE HCL 5 MG PO TABS
5.0000 mg | ORAL_TABLET | ORAL | Status: AC | PRN
  Administered 2024-06-13: 5 mg via ORAL
  Filled 2024-06-11: qty 1

## 2024-06-11 MED ORDER — GABAPENTIN 400 MG PO CAPS
800.0000 mg | ORAL_CAPSULE | Freq: Three times a day (TID) | ORAL | Status: AC
  Administered 2024-06-11 – 2024-06-14 (×10): 800 mg via ORAL
  Filled 2024-06-11 (×10): qty 2

## 2024-06-11 MED ORDER — SODIUM CHLORIDE 0.9% FLUSH
3.0000 mL | Freq: Two times a day (BID) | INTRAVENOUS | Status: AC
  Administered 2024-06-11 – 2024-06-14 (×6): 3 mL via INTRAVENOUS

## 2024-06-11 MED ORDER — LACTATED RINGERS IV SOLN: INTRAVENOUS | Status: AC

## 2024-06-11 MED ORDER — SODIUM CHLORIDE 0.9 % IV SOLN
1000.0000 mg | Freq: Every day | INTRAVENOUS | Status: AC
  Administered 2024-06-12 – 2024-06-13 (×2): 1000 mg via INTRAVENOUS
  Filled 2024-06-11 (×2): qty 16

## 2024-06-11 MED ORDER — FERROUS SULFATE 325 (65 FE) MG PO TABS
325.0000 mg | ORAL_TABLET | Freq: Every day | ORAL | Status: AC
  Administered 2024-06-12 – 2024-06-14 (×3): 325 mg via ORAL
  Filled 2024-06-11 (×3): qty 1

## 2024-06-11 MED ORDER — ORAL CARE MOUTH RINSE: 15.0000 mL | OROMUCOSAL | Status: AC | PRN

## 2024-06-11 MED ORDER — SODIUM CHLORIDE 0.9 % IV SOLN: INTRAVENOUS | Status: DC

## 2024-06-11 MED ORDER — HYDROMORPHONE HCL 1 MG/ML IJ SOLN
0.5000 mg | INTRAMUSCULAR | Status: AC | PRN
  Administered 2024-06-12 – 2024-06-13 (×2): 1 mg via INTRAVENOUS
  Filled 2024-06-11 (×2): qty 1

## 2024-06-11 MED ORDER — SODIUM CHLORIDE 0.9 % IV SOLN
1000.0000 mg | Freq: Once | INTRAVENOUS | Status: AC
  Administered 2024-06-11: 1000 mg via INTRAVENOUS
  Filled 2024-06-11: qty 16

## 2024-06-11 MED ORDER — SENNOSIDES-DOCUSATE SODIUM 8.6-50 MG PO TABS
1.0000 | ORAL_TABLET | Freq: Every evening | ORAL | Status: AC | PRN
  Administered 2024-06-14: 1 via ORAL
  Filled 2024-06-11: qty 1

## 2024-06-11 MED ORDER — PANTOPRAZOLE SODIUM 40 MG PO TBEC
40.0000 mg | DELAYED_RELEASE_TABLET | Freq: Two times a day (BID) | ORAL | Status: AC
  Administered 2024-06-11 – 2024-06-14 (×7): 40 mg via ORAL
  Filled 2024-06-11 (×8): qty 1

## 2024-06-11 MED ORDER — GADOBUTROL 1 MMOL/ML IV SOLN
10.0000 mL | Freq: Once | INTRAVENOUS | Status: AC | PRN
  Administered 2024-06-11: 10 mL via INTRAVENOUS

## 2024-06-11 MED ORDER — IPRATROPIUM BROMIDE 0.02 % IN SOLN: 0.5000 mg | Freq: Four times a day (QID) | RESPIRATORY_TRACT | Status: AC | PRN

## 2024-06-11 MED ORDER — PROCHLORPERAZINE EDISYLATE 10 MG/2ML IJ SOLN
10.0000 mg | Freq: Once | INTRAMUSCULAR | Status: AC
  Administered 2024-06-11: 10 mg via INTRAVENOUS
  Filled 2024-06-11: qty 2

## 2024-06-11 MED ORDER — TRAZODONE HCL 50 MG PO TABS: 25.0000 mg | ORAL_TABLET | Freq: Every evening | ORAL | Status: AC | PRN

## 2024-06-11 NOTE — Consult Note (Signed)
 NEUROLOGY CONSULT NOTE   Date of service: 2024/07/04 Patient Name: [name] MRN:  [id number] DOB:  July 17, 1975 Chief Complaint: Right sided weakness Requesting Provider: Willette Adriana LABOR, MD  History of Present Illness  [name] is a 49 y.o. female with hx of MS previously on glatiramer  in 2022 to 2024.  She received a dose of Kesimpta  but did not tolerate it.  She stopped taking the glatiramer  because she believed that the Copaxone  caused her lung lesions (metastatic leiomyoma) to grow.  She has been off of immunosuppressive therapy since that time.  She presented today with concerns for right sided weakness and numbness and had an MRI with enhancing lesions.  She states that she has been having trouble walking today because of the weakness and numbness.  She was complaining of a headache and was given Compazine /Benadryl , which is in addition to her baclofen that she had taken this morning as well as gabapentin  and she has become quite sedated because of this.  History has been obtained from the chart and is limited from her to because of this.  Past History   Past Medical History:  Diagnosis Date   Anxiety    BMI 30.0-30.9,adult    Depression    Hypertension    Multiple sclerosis    Uterine fibroid     Past Surgical History:  Procedure Laterality Date   ABDOMINAL HYSTERECTOMY     2010   CESAREAN SECTION     x 2    Family History: Family History  Problem Relation Age of Onset   Cancer Mother    HIV Father    Multiple sclerosis Daughter    Multiple sclerosis Cousin     Social History  reports that she has never smoked. She has never used smokeless tobacco. She reports that she does not drink alcohol  and does not use drugs.  Allergies[1]  Medications  Current Medications[2]  Vitals   Vitals:   04-Jul-2024 0841 July 04, 2024 1113 07/04/2024 1302 07-04-2024 1434  BP:  (!) 153/99 136/81 135/73  Pulse:  (!) 116 (!) 116 (!) 108  Resp:  20 20 17   Temp:   98.1 F (36.7 C)  99.7 F (37.6 C)  TempSrc:    Oral  SpO2:  100% 98% 96%  Weight: 104.8 kg     Height: 5' 8 (1.727 m)       Body mass index is 35.12 kg/m.   Physical Exam   Constitutional: Appears well-developed and well-nourished.   Neurologic Examination    Neuro: Mental Status: Patient is lethargic, but once she is aroused she is oriented. Cranial Nerves: II: Visual Fields are full. Pupils are equal, round, and reactive to light.   III,IV, VI: EOMI without ptosis or diploplia.  V: Facial sensation is symmetric to temperature VII: Facial movement is symmetric.  VIII: hearing is intact to voice X: Uvula elevates symmetrically XII: tongue is midline without atrophy or fasciculations.  Motor: She has mild drift, but normal strength to confrontation in the arms and legs Sensory: Sensation is reduced on the right side Cerebellar: She does appear mildly discoordinated in the right arm when checking finger-nose-finger       Labs/Imaging/Neurodiagnostic studies   CBC:  Recent Labs  Lab 07-04-24 0920  WBC 8.6  NEUTROABS 7.3  HGB 10.4*  HCT 37.3  MCV 70.6*  PLT 349   Basic Metabolic Panel:  Lab Results  Component Value Date   NA 138 July 04, 2024   K 3.8  06/11/2024   CO2 26 06/11/2024   GLUCOSE 107 (H) 06/11/2024   BUN 9 06/11/2024   CREATININE 0.88 06/11/2024   CALCIUM 9.0 06/11/2024   GFRNONAA >60 06/11/2024   GFRAA >60 07/13/2017   Lipid Panel:  Lab Results  Component Value Date   LDLCALC 72 04/11/2020   HgbA1c:  Lab Results  Component Value Date   HGBA1C 5.9 (H) 04/11/2020   Urine Drug Screen: No results found for: LABOPIA, COCAINSCRNUR, LABBENZ, AMPHETMU, THCU, LABBARB  Alcohol  Level No results found for: [facility] INR  Lab Results  Component Value Date   INR 1.0 11/25/2008   APTT  Lab Results  Component Value Date   APTT 29 11/25/2008    MRI Brain(Personally reviewed): Enhancing brainstem lesion  ASSESSMENT   [name] is a 49 y.o. female with right sided discoordination, difficulty walking, right sided numbness with an enhancing lesion on MRI.  Given the difficulty walking, I would favor admission with IV Solu-Medrol  and PT evaluation.  Depending on her response we can do 3 to 5 days of IV Solu-Medrol .  She has established care with [facility] neurology and will likely need to follow-up with them to consider restarting disease modifying therapy following discharge.  RECOMMENDATIONS  Solu-Medrol  1 g daily x 3 to 5 days depending on response Physical therapy Continue home gabapentin  ______________________________________________________________________    Signed, Aisha Seals, MD [facility]     [1]  Allergies Allergen Reactions   Naproxen     Porcine (Pork) Protein-Containing Drug Products     Religous beliefs. Ok with lovenox .   Latex Rash  [2]  Current Facility-Administered Medications:    acetaminophen  (TYLENOL ) tablet 650 mg, 650 mg, Oral, Q6H PRN **OR** acetaminophen  (TYLENOL ) suppository 650 mg, 650 mg, Rectal, Q6H PRN, Shahmehdi, Seyed A, MD   hydrALAZINE  (APRESOLINE ) injection 10 mg, 10 mg, Intravenous, Q4H PRN, Shahmehdi, Seyed A, MD   HYDROmorphone  (DILAUDID ) injection 0.5-1 mg, 0.5-1 mg, Intravenous, Q2H PRN, Shahmehdi, Seyed A, MD   ipratropium (ATROVENT ) nebulizer solution 0.5 mg, 0.5 mg, Nebulization, Q6H PRN, Shahmehdi, Seyed A, MD   lactated ringers  infusion, , Intravenous, Continuous, Shahmehdi, Seyed A, MD   methylPREDNISolone  sodium succinate (SOLU-MEDROL ) 1,000 mg in sodium chloride  0.9 % 50 mL IVPB, 1,000 mg, Intravenous, Once, Lorelle Aleck BROCKS, PA-C   [START ON 06/12/2024] methylPREDNISolone  sodium succinate (SOLU-MEDROL ) 1,000 mg in sodium chloride  0.9 % 50 mL IVPB, 1,000 mg, Intravenous, Daily, Nita Whitmire, Aisha SQUIBB, MD   ondansetron  (ZOFRAN ) tablet 4 mg, 4 mg, Oral, Q6H PRN **OR** ondansetron  (ZOFRAN ) injection 4 mg, 4 mg, Intravenous, Q6H PRN,  Shahmehdi, Seyed A, MD   oxyCODONE  (Oxy IR/ROXICODONE ) immediate release tablet 5 mg, 5 mg, Oral, Q4H PRN, Shahmehdi, Seyed A, MD   senna-docusate (Senokot-S) tablet 1 tablet, 1 tablet, Oral, QHS PRN, Shahmehdi, Seyed A, MD   sodium chloride  flush (NS) 0.9 % injection 3 mL, 3 mL, Intravenous, Q12H, Shahmehdi, Seyed A, MD   sodium chloride  flush (NS) 0.9 % injection 3 mL, 3 mL, Intravenous, Q12H, Shahmehdi, Seyed A, MD   sodium phosphate  (FLEET) enema 1 enema, 1 enema, Rectal, Once PRN, Shahmehdi, Seyed A, MD   traZODone  (DESYREL ) tablet 25 mg, 25 mg, Oral, QHS PRN, Shahmehdi, Seyed A, MD  Current Outpatient Medications:    Alcohol  Swabs  PADS, Use as directed, Disp: 200 each, Rfl: 6   Aspirin -Caffeine  (BAYER BACK & BODY PO), Take 1 tablet by mouth every 6 (six) hours as needed (back pain)., Disp: , Rfl:  EMGALITY  120 MG/ML SOSY, Inject 1 mL into the skin every 30 (thirty) days., Disp: , Rfl:    ferrous sulfate  325 (65 FE) MG tablet, Take 1 tablet (325 mg total) by mouth daily., Disp: 30 tablet, Rfl: 0   gabapentin  (NEURONTIN ) 800 MG tablet, Take 1 tablet (800 mg total) by mouth 3 (three) times daily., Disp: 90 tablet, Rfl: 3   Glatiramer  Acetate 40 MG/ML SOSY, Inject 40 mg into the skin every Monday, Wednesday and Friday, Disp: 12 mL, Rfl: 2   imipramine  (TOFRANIL ) 25 MG tablet, TAKE 1 TABLET BY MOUTH EVERYDAY AT BEDTIME, Disp: 90 tablet, Rfl: 1   KESIMPTA  20 MG/0.4ML SOAJ, Inject 20mg  SQ at week 0, 1, & 2. Then start maintenance dose at week 4, Disp: 1.2 mL, Rfl: 0   KESIMPTA  20 MG/0.4ML SOAJ, Inject 0.4 mLs into the skin every 30 (thirty) days., Disp: 0.4 mL, Rfl: 11   lamoTRIgine  (LAMICTAL ) 25 MG tablet, Take one pill daily x 3 days, then one pill twice daily x 3 days, then one pill three times daily x 3 days, then 2 pills twice daily., Disp: 120 tablet, Rfl: 5   naproxen  (NAPROSYN ) 500 MG tablet, Take 1 tablet (500 mg total) by mouth 2 (two) times daily. (Patient not taking: Reported on  05/31/2022), Disp: 30 tablet, Rfl: 0   oxyCODONE -acetaminophen  (PERCOCET) 5-325 MG tablet, Take 1-2 tablets by mouth every 8 (eight) hours as needed for severe pain (pain score 7-10)., Disp: 10 tablet, Rfl: 0   pantoprazole  (PROTONIX ) 40 MG tablet, Take 1 tablet (40 mg total) by mouth 2 (two) times daily for 5 days., Disp: 10 tablet, Rfl: 0   predniSONE  (STERAPRED UNI-PAK 21 TAB) 10 MG (21) TBPK tablet, Take by mouth daily. Take 6 tabs by mouth daily  for 2 days, then 5 tabs for 2 days, then 4 tabs for 2 days, then 3 tabs for 2 days, 2 tabs for 2 days, then 1 tab by mouth daily for 2 days, Disp: 42 tablet, Rfl: 0   sharps container, Use as directed, Disp: 1 each, Rfl: 6   SUMAtriptan  (IMITREX ) 100 MG tablet, Take 1 tablet (100 mg total) by mouth once as needed for up to 1 dose for migraine. May repeat in 2 hours if headache persists or recurs. (Patient not taking: Reported on 05/31/2022), Disp: 10 tablet, Rfl: 11   UBRELVY  100 MG TABS, TAKE 1 TABLET (100 MG TOTAL) BY MOUTH AS NEEDED. CAN TAKE ONE ADDITIONAL DOSE AT LEAST 2 HOURS AFTER THE INITIAL DOSE IF NEEDED. MAX DAILY DOSE IS 200 MG TOTAL., Disp: 16 tablet, Rfl: 2

## 2024-06-11 NOTE — Assessment & Plan Note (Signed)
 Likely to due to MS flareup -Continue with IV fluids IV steroids -Neurochecks

## 2024-06-11 NOTE — ED Notes (Signed)
 PT back from MRI via stretcher in stable condition. Pt is sleeping, but arousable to voice. VSS, dispo pending

## 2024-06-11 NOTE — Hospital Course (Addendum)
 [name] is a 49 Y.o Female with extensive history of multiple sclerosis, HTN, Anxiety , chief complaint of weakness, multifocal focus. Is not very cooperative and is very poor historian. Presented this morning was experiencing right-sided weakness, had difficulty putting her clothes on.  Felt like as if she was walking in circles.  That she could not focus. Denies of any headaches or visual changes. Denies of any recent illnesses, such as fever, chills, nausea or vomiting.  ED evaluation: Blood pressure 135/73, pulse (!) 108, temperature 99.7 F (37.6 C), temperature source Oral, resp. rate 17, height 5' 8 (1.727 m), weight 104.8 kg, SpO2 96%.  Labs: [facility] CMP reviewed within normal limits with exception of hemoglobin of 10.4 Troponin < 6.0  Patient was found tachycardic, orthostatic hypotension  MRI of the brain: 1. New enhancing lesion anteromedially within the left cerebral peduncle, compatible with acute demyelination. 2. Additional right lateral midbrain lesion again noted, unchanged. 3. Mild-to-moderate periventricular white matter disease, most pronounced around the atria of the lateral ventricles, as before. 4. Stable 9 mm round enhancing nodule along the right frontoparietal vertex abutting the superior sagittal sinus, compatible with a meningioma

## 2024-06-11 NOTE — ED Triage Notes (Signed)
 Patient said at 9pm last night she began having a headache, felt like her blood pressure was high, felt like her heart rate was elevated, right arm and right hand numbness. She said it feels like her right hand has been stuck in the snow. She felt dizzy when walking to the bathroom and weakness in both of her legs. Has trouble gripping with her right hand. No change in vision.

## 2024-06-11 NOTE — Evaluation (Signed)
 Physical Therapy Evaluation Patient Details Name: [name] MRN: [id number] DOB: 1975/09/22 Today's Date: 06/11/2024  History of Present Illness  49 yo female admitted with HA, R side numbness/weakness, dizziness. MRI (+) new enhancing lesion L central peduncle. Hx of MS, anxiety, depression  Clinical Impression  On eval in ED (PT/OT), pt was CGA for bed mobility. She appeared drowsy but also restless. She had some difficulty responding to PLOF/history questioning prior to mobilizing. She kept her eyes closed for the majority of the session. She was able to sit up on edge of stretcher for a few minutes before c/o feeling hot, nauseous, and generally unwell. BP reading 87/45. Pt exhibited restlessness and anxiety. Assisted pt back to bed with emesis bag and cool cloth. Pt removed her head covering. Assessed BP again-now reading WNL. Updated RN at end of session. Nursing did report that pt had been able to ambulate to/from bathroom prior to PT/OT arrival.  Unfortunately, PT eval was quite limited at this time. At this time, pt does not appear ready to be discharged home. PT will continue to assess as safely able. Currently recommend HHPT f/u-wil update recommendation as able.       If plan is discharge home, recommend the following: A little help with walking and/or transfers;A little help with bathing/dressing/bathroom;Assist for transportation;Help with stairs or ramp for entrance   Can travel by private vehicle        Equipment Recommendations  (continuing to assess)  Recommendations for Other Services       Functional Status Assessment Patient has had a recent decline in their functional status and demonstrates the ability to make significant improvements in function in a reasonable and predictable amount of time.     Precautions / Restrictions Precautions Precautions: Fall Precaution/Restrictions Comments: monitor BP, dizziness Restrictions Weight Bearing Restrictions Per  Provider Order: No      Mobility  Bed Mobility Overal bed mobility: Needs Assistance Bed Mobility: Supine to Sit, Sit to Supine     Supine to sit: Contact guard Sit to supine: Contact guard assist   General bed mobility comments: CGA for safety. Pt moved quickly and a bit unsafe when getting to edge of stretcher. Increased trunk swaying. OT performing UE assessment when pt then c/o feeling hot, nauseous, and feeling unsure if she needed to use the bathroom. BP 87/45 while sitting on edge of stretcher. Pt became quite restless and anxious. Assisted pt back onto stretcher. BP assessed again and WNL.    Transfers                   General transfer comment: Unable to assess    Ambulation/Gait                  Stairs            Wheelchair Mobility     Tilt Bed    Modified Rankin (Stroke Patients Only)       Balance Overall balance assessment: Needs assistance   Sitting balance-Leahy Scale: Good Sitting balance - Comments: No overt LOB while sitting up.                                     Pertinent Vitals/Pain Pain Assessment Pain Assessment: Faces Faces Pain Scale: Hurts little more Pain Location: back Pain Intervention(s): Limited activity within patient's tolerance, Monitored during session, Repositioned    Home Living Family/patient expects  to be discharged to:: Private residence Living Arrangements: Children Available Help at Discharge: Family Type of Home: House Home Access: Stairs to enter Entrance Stairs-Rails: None Secretary/administrator of Steps: 4   Home Layout: One level Home Equipment: None      Prior Function Prior Level of Function : Independent/Modified Independent                     Extremity/Trunk Assessment   Upper Extremity Assessment Upper Extremity Assessment: Defer to OT evaluation    Lower Extremity Assessment Lower Extremity Assessment:  (unable to fully assess. she was able to  mobilize R LE on/off bed and while in bed. MMT limited by low BP/nausea/dizziness once EOB. also limited by pt's level of alertness (drowsy))    Cervical / Trunk Assessment Cervical / Trunk Assessment: Normal  Communication   Communication Communication: No apparent difficulties    Cognition Arousal: Lethargic Behavior During Therapy: Restless                           PT - Cognition Comments: kept eyes closed for majority of session. sometimes she did not respond to questions. Following commands: Intact       Cueing Cueing Techniques: Verbal cues     General Comments      Exercises     Assessment/Plan    PT Assessment Patient needs continued PT services  PT Problem List Decreased strength;Decreased mobility;Decreased activity tolerance;Pain       PT Treatment Interventions DME instruction;Gait training;Functional mobility training;Stair training;Therapeutic activities;Therapeutic exercise;Balance training;Patient/family education;Neuromuscular re-education    PT Goals (Current goals can be found in the Care Plan section)  Acute Rehab PT Goals Patient Stated Goal: none stated PT Goal Formulation: Patient unable to participate in goal setting Time For Goal Achievement: 06/25/24 Potential to Achieve Goals: Good    Frequency Min 3X/week     Co-evaluation               AM-PAC PT 6 Clicks Mobility  Outcome Measure Help needed turning from your back to your side while in a flat bed without using bedrails?: A Little Help needed moving from lying on your back to sitting on the side of a flat bed without using bedrails?: A Little Help needed moving to and from a bed to a chair (including a wheelchair)?: A Little Help needed standing up from a chair using your arms (e.g., wheelchair or bedside chair)?: A Little Help needed to walk in hospital room?: A Little Help needed climbing 3-5 steps with a railing? : A Lot 6 Click Score: 17    End of Session    Activity Tolerance:  (limited by nausea, feeling unwell, low BP) Patient left: in bed;with call bell/phone within reach Nurse Communication:  (BP reading, pt symptoms) PT Visit Diagnosis: Other symptoms and signs involving the nervous system (M70.101)    Time: 8591-8565 PT Time Calculation (min) (ACUTE ONLY): 26 min   Charges:   PT Evaluation $PT Eval Low Complexity: 1 Low   PT General Charges $$ ACUTE PT VISIT: 1 Visit            Dannial SQUIBB, PT Acute Rehabilitation  Office: [phone]

## 2024-06-11 NOTE — Progress Notes (Signed)
 PHARMACY - ANTICOAGULATION CONSULT NOTE  Pharmacy Consult for Xarelto  Indication: VTE prophylaxis  Allergies[1]  Patient Measurements: Height: 5' 8 (172.7 cm) Weight: 104.8 kg (231 lb) IBW/kg (Calculated) : 63.9 HEPARIN  DW (KG): 87.3  Vital Signs: Temp: 99.7 F (37.6 C) (02/03 1434) Temp Source: Oral (02/03 1434) BP: 135/73 (02/03 1434) Pulse Rate: 108 (02/03 1434)  Labs: Recent Labs    06/11/24 0920  HGB 10.4*  HCT 37.3  PLT 349  CREATININE 0.88    Estimated Creatinine Clearance: 101.3 mL/min (by C-G formula based on SCr of 0.88 mg/dL).   Medical History: Past Medical History:  Diagnosis Date   Anxiety    BMI 30.0-30.9,adult    Depression    Hypertension    Multiple sclerosis    Uterine fibroid     Medications:  No prior to admission anticoagulant meds listed.  Assessment: Pharmacy consulted to dose Xarelto  for VTE prophylaxis for this 49 yo female who lists pork products as an allergy for religious beliefs.   Goal of Therapy:  VTE prophylaxis Monitor platelets by anticoagulation protocol: Yes   Plan:  Xarelto  10 mg once daily With no dose adjustment anticipated, Pharmacy will sign off on consult while continuing to monitor CBC as ordered by provider along with signs/symptoms of bleeding  Eleanor Agent, PharmD, BCPS 06/11/2024,3:58 PM      [1]  Allergies Allergen Reactions   Naproxen     Porcine (Pork) Protein-Containing Drug Products     Religous beliefs. Ok with lovenox .   Latex Rash

## 2024-06-11 NOTE — Plan of Care (Signed)
 On-call neurology note  I was called by the ED provider from [facility] regarding this patient who has been having headache as well as right arm and leg numbness and weakness since the last few days. She also has had dizziness. She has a history of multiple sclerosis, not on disease-modifying therapy Initial symptoms that led to her MS diagnosis were very similar to what is going on right now. Her last imaging in our system is from August 05, 2023-MRI of the head and C-spine with and without contrast, which showed nonenhancing demyelinating lesions in the brain and spinal cord-with the caveat that although there was no enhancement in the cord at the C4-5 and C5-6 lesion was more prominent than the prior studies. I would suspect that there might be a MS flareup more likely than complicated migraine given her history.  For that reason, I would recommend getting an MRI of the brain with and without contrast and an MRI of the C-spine with and without contrast.  If she has enhancing lesions, she will need an in person consultation and consideration for steroids.  In either case, I would suggest that she go back to her outpatient neurologist and discuss disease-modifying therapy as literature is plentiful on milder MS and even CIS patients who are on therapy doing better over time in terms of their neurological status then does who are not on treatment.  Please call back with the results as needed.  Eligio Lav, MD Neurology

## 2024-06-11 NOTE — Assessment & Plan Note (Addendum)
-   Excessive somnolence, right-sided weakness -Continue with neurochecks -Neurology consulted-appreciate further evaluation recommendation  - MRI of the brain revealed new lesions Ridging/MRI of the brain: 1. New enhancing lesion anteromedially within the left cerebral peduncle, compatible with acute demyelination. 2. Additional right lateral midbrain lesion again noted, unchanged. 3. Mild-to-moderate periventricular white matter disease, most pronounced around the atria of the lateral ventricles, as before. 4. Stable 9 mm round enhancing nodule along the right frontoparietal vertex abutting the superior sagittal sinus, compatible with a meningioma  - Continue IV fluid hydration, IVF - Please follow-up with the final home medication reconciliation to resume meds (neurology input appreciated

## 2024-06-12 DIAGNOSIS — G35D Multiple sclerosis, unspecified: Secondary | ICD-10-CM | POA: Diagnosis not present

## 2024-06-12 LAB — CBC
HCT: 36.7 % (ref 36.0–46.0)
Hemoglobin: 10.3 g/dL — ABNORMAL LOW (ref 12.0–15.0)
MCH: 19.7 pg — ABNORMAL LOW (ref 26.0–34.0)
MCHC: 28.1 g/dL — ABNORMAL LOW (ref 30.0–36.0)
MCV: 70 fL — ABNORMAL LOW (ref 80.0–100.0)
Platelets: 342 10*3/uL (ref 150–400)
RBC: 5.24 MIL/uL — ABNORMAL HIGH (ref 3.87–5.11)
RDW: 16.7 % — ABNORMAL HIGH (ref 11.5–15.5)
WBC: 5.1 10*3/uL (ref 4.0–10.5)
nRBC: 0 % (ref 0.0–0.2)

## 2024-06-12 LAB — BASIC METABOLIC PANEL WITH GFR
Anion gap: 9 (ref 5–15)
BUN: 9 mg/dL (ref 6–20)
CO2: 23 mmol/L (ref 22–32)
Calcium: 8.9 mg/dL (ref 8.9–10.3)
Chloride: 107 mmol/L (ref 98–111)
Creatinine, Ser: 0.64 mg/dL (ref 0.44–1.00)
GFR, Estimated: >60 mL/min
Glucose, Bld: 169 mg/dL — ABNORMAL HIGH (ref 70–99)
Potassium: 3.8 mmol/L (ref 3.5–5.1)
Sodium: 138 mmol/L (ref 135–145)

## 2024-06-12 LAB — GLUCOSE, CAPILLARY
Glucose-Capillary: 205 mg/dL — ABNORMAL HIGH (ref 70–99)
Glucose-Capillary: 183 mg/dL — ABNORMAL HIGH (ref 70–99)

## 2024-06-12 LAB — HEMOGLOBIN A1C
Hgb A1c MFr Bld: 5.9 % — ABNORMAL HIGH (ref 4.8–5.6)
Mean Plasma Glucose: 122.63 mg/dL

## 2024-06-12 MED ORDER — ALUM & MAG HYDROXIDE-SIMETH 200-200-20 MG/5ML PO SUSP
15.0000 mL | ORAL | Status: AC | PRN
  Administered 2024-06-12 – 2024-06-14 (×3): 15 mL via ORAL
  Filled 2024-06-12 (×3): qty 30

## 2024-06-12 MED ORDER — INSULIN ASPART 100 UNIT/ML IJ SOLN
0.0000 [IU] | Freq: Three times a day (TID) | INTRAMUSCULAR | Status: AC
  Administered 2024-06-12: 7 [IU] via SUBCUTANEOUS
  Administered 2024-06-13 (×3): 3 [IU] via SUBCUTANEOUS
  Administered 2024-06-14: 7 [IU] via SUBCUTANEOUS
  Filled 2024-06-12: qty 7
  Filled 2024-06-12: qty 3
  Filled 2024-06-12: qty 7
  Filled 2024-06-12 (×2): qty 3

## 2024-06-12 MED ORDER — INSULIN ASPART 100 UNIT/ML IJ SOLN
0.0000 [IU] | Freq: Every day | INTRAMUSCULAR | Status: AC
  Administered 2024-06-14: 2 [IU] via SUBCUTANEOUS
  Filled 2024-06-12: qty 2

## 2024-06-12 MED ORDER — LIDOCAINE 5 % EX PTCH
1.0000 | MEDICATED_PATCH | CUTANEOUS | Status: AC
  Administered 2024-06-12 – 2024-06-14 (×3): 1 via TRANSDERMAL
  Filled 2024-06-12 (×3): qty 1

## 2024-06-12 NOTE — Progress Notes (Signed)
 " PROGRESS NOTE    [name]  FMW:[id number] DOB: March 25, 1976 DOA: 06/11/2024 PCP: Loris Elsie PARAS, PA-C   Brief Narrative:  49 year old female with history of multiple sclerosis, hypertension, anxiety presented with right-sided weakness and numbness.  On presentation, MRI of the brain showed numerous enhancing lesion anteromedially within the left cerebral peduncle, compatible with acute demyelination.  Neurology was consulted: started on high-dose Solu-Medrol .  Assessment & Plan:   Possible multiple sclerosis flare present right-sided weakness and numbness -presented with right-sided weakness and numbness.  On presentation, MRI of the brain showed numerous enhancing lesion anteromedially within the left cerebral peduncle, compatible with acute demyelination.  Neurology was consulted: started on high-dose Solu-Medrol .  Neurology is recommending 3 to 5 days of Solu-Medrol  - PT/OT eval - Continue gabapentin   Orthostatic hypotension-improved  Microcytic anemia -Hemoglobin stable.  Continue oral iron supplementation  Obesity class II - Outpatient follow-up  DVT prophylaxis: Xarelto  Code Status: Full Family Communication: None at bedside Disposition Plan: Status is: Observation The patient will require care spanning > 2 midnights and should be moved to inpatient because: of severity of illness    Consultants: Neurology  Procedures: None  Antimicrobials: None   Subjective: Patient seen and examined at bedside.  Continues to have right-sided weakness and numbness but improving.  No fever or vomiting reported.  Objective: Vitals:   06/11/24 1434 06/11/24 1649 06/11/24 2048 06/12/24 0435  BP: 135/73 128/66 128/76 (!) 148/94  Pulse: (!) 108 (!) 105 80 65  Resp: 17 20 18 18   Temp: 99.7 F (37.6 C) 98.6 F (37 C) 98.4 F (36.9 C) 97.6 F (36.4 C)  TempSrc: Oral Oral Oral Oral  SpO2: 96% 98% 98% 98%  Weight:      Height:        Intake/Output Summary (Last 24 hours)  at 06/12/2024 0730 Last data filed at 06/11/2024 1810 Gross per 24 hour  Intake 2165.15 ml  Output --  Net 2165.15 ml   Filed Weights   06/11/24 0841  Weight: 104.8 kg    Examination:  General exam: Appears calm and comfortable  Respiratory system: Bilateral decreased breath sounds at bases Cardiovascular system: S1 & S2 heard, Rate controlled Gastrointestinal system: Abdomen is obese, nondistended, soft and nontender. Normal bowel sounds heard. Extremities: No cyanosis, clubbing, edema  Central nervous system: Alert and oriented.  Right-sided weakness present skin: No rashes, lesions or ulcers Psychiatry: Judgement and insight appear normal. Mood & affect appropriate.     Data Reviewed: I have personally reviewed following labs and imaging studies  CBC: Recent Labs  Lab 06/11/24 0920 06/12/24 0643  WBC 8.6 5.1  NEUTROABS 7.3  --   HGB 10.4* 10.3*  HCT 37.3 36.7  MCV 70.6* 70.0*  PLT 349 342   Basic Metabolic Panel: Recent Labs  Lab 06/11/24 0920 06/11/24 1655 06/12/24 0643  NA 138  --  138  K 3.8  --  3.8  CL 103  --  107  CO2 26  --  23  GLUCOSE 107*  --  169*  BUN 9  --  9  CREATININE 0.88  --  0.64  CALCIUM 9.0  --  8.9  MG 1.9 2.1  --   PHOS  --  2.7  --    GFR: Estimated Creatinine Clearance: 111.4 mL/min (by C-G formula based on SCr of 0.64 mg/dL). Liver Function Tests: Recent Labs  Lab 06/11/24 0920  AST 18  ALT 10  ALKPHOS 76  BILITOT 0.4  PROT  7.1  ALBUMIN 4.0   Recent Labs  Lab 06/11/24 0920  LIPASE 30   No results for input(s): AMMONIA in the last 168 hours. Coagulation Profile: No results for input(s): INR, PROTIME in the last 168 hours. Cardiac Enzymes: No results for input(s): CKTOTAL, CKMB, CKMBINDEX, TROPONINI in the last 168 hours. BNP (last 3 results) Recent Labs    06/11/24 1655  PROBNP 251.0   HbA1C: No results for input(s): HGBA1C in the last 72 hours. CBG: No results for input(s): GLUCAP in the  last 168 hours. Lipid Profile: No results for input(s): CHOL, HDL, LDLCALC, TRIG, CHOLHDL, LDLDIRECT in the last 72 hours. Thyroid Function Tests: Recent Labs    06/11/24 1655  TSH 0.319*  FREET4 0.88   Anemia Panel: No results for input(s): VITAMINB12, FOLATE, FERRITIN, TIBC, IRON, RETICCTPCT in the last 72 hours. Sepsis Labs: No results for input(s): PROCALCITON, LATICACIDVEN in the last 168 hours.  No results found for this or any previous visit (from the past 240 hours).       Radiology Studies: MR Cervical Spine W and Wo Contrast Result Date: 06/11/2024 EXAM: MRI CERVICAL SPINE WITH AND WITHOUT CONTRAST 06/11/2024 12:32:17 PM TECHNIQUE: Multiplanar multisequence MRI of the cervical spine was performed without and with the administration of 10 mL gadobutrol  (GADAVIST ) 1 MMOL/ML injection. COMPARISON: MRI of the cervical spine dated 08/05/2023. CLINICAL HISTORY: possible MS flare Possible multiple sclerosis flare. FINDINGS: BONES AND ALIGNMENT: The cervical vertebrae maintain their height and alignment. Marrow signal is unremarkable. No abnormal enhancement. There is no significant interval change. SPINAL CORD: Normal spinal cord size. There are focal areas of mildly increased T2 signal again demonstrated within the spinal cord at C2-C3 centrally and on the right at C4-C5, which is evident on image 16 of the axial series. A subtle area of increased T2 STIR signal is also seen at the C5-C6 level, axial image 20, also similar to the prior study. There are no new or enhancing lesions present. There is no significant interval change. SOFT TISSUES: No paraspinal mass. C2-C3: No significant disc herniation. No spinal canal stenosis or neural foraminal narrowing. C3-C4: No significant disc herniation. No spinal canal stenosis or neural foraminal narrowing. C4-C5: No significant disc herniation. No spinal canal stenosis or neural foraminal narrowing. C5-C6: No significant  disc herniation. No spinal canal stenosis or neural foraminal narrowing. C6-C7: No significant disc herniation. No spinal canal stenosis or neural foraminal narrowing. C7-T1: No significant disc herniation. No spinal canal stenosis or neural foraminal narrowing. IMPRESSION: 1. Stable focal areas of mildly increased T2 signal within the spinal cord at C2-3 centrally, on the right at C4-5, and at C5-6, without enhancement or new lesions. Electronically signed by: Evalene Coho MD 06/11/2024 01:05 PM EST RP Workstation: [id number]   MR Brain W and Wo Contrast Result Date: 06/11/2024 EXAM: MRI BRAIN WITH AND WITHOUT CONTRAST 06/11/2024 12:32:17 PM TECHNIQUE: Multiplanar multisequence MRI of the head/brain was performed with and without the administration of intravenous contrast. COMPARISON: MRI of the head dated 08/05/2023. CLINICAL HISTORY: possible MS flare Possible multiple sclerosis flare. FINDINGS: BRAIN AND VENTRICLES: No acute infarct. No acute intracranial hemorrhage. No mass effect or midline shift. No hydrocephalus. The sella is unremarkable. Normal flow voids. There is a new lesion present anteromedially within the left cerebral peduncle, which is mildly hyperintense on diffusion and T2 FLAIR and demonstrates contrast enhancement, compatible with acute demyelination. It is seen on image 21 of series 10 and image 66 of series 18. There is age-related  atrophy. There is mild-to-moderate periventricular white matter disease, most pronounced around the atria of the lateral ventricles, as before. A lesion within the right lateral midbrain is also again noted. A round enhancing nodule measuring approximately 9 mm in diameter is again seen along the falx at the right frontoparietal vertex. It abuts the superior sagittal sinus and appears unchanged in the interim. It is compatible with a meningioma. ORBITS: No significant abnormality. SINUSES: No significant abnormality. BONES AND SOFT TISSUES: Normal bone marrow  signal. No soft tissue abnormality. IMPRESSION: 1. New enhancing lesion anteromedially within the left cerebral peduncle, compatible with acute demyelination. 2. Additional right lateral midbrain lesion again noted, unchanged. 3. Mild-to-moderate periventricular white matter disease, most pronounced around the atria of the lateral ventricles, as before. 4. Stable 9 mm round enhancing nodule along the right frontoparietal vertex abutting the superior sagittal sinus, compatible with a meningioma. Electronically signed by: Evalene Coho MD 06/11/2024 12:55 PM EST RP Workstation: [id number]   DG Chest Portable 1 View Result Date: 06/11/2024 CLINICAL DATA:  Chest pain EXAM: PORTABLE CHEST 1 VIEW COMPARISON:  Chest radiograph dated 01/06/2024 FINDINGS: Normal lung volumes. Similar hazy left retrocardiac opacity. Unchanged nodule in the left mid lung. No pleural effusion or pneumothorax. The heart size and mediastinal contours are within normal limits. No acute osseous abnormality. IMPRESSION: No active disease. Electronically Signed   By: Limin  Xu M.D.   On: 06/11/2024 09:22        Scheduled Meds:  ferrous sulfate   325 mg Oral Q breakfast   gabapentin   800 mg Oral TID   pantoprazole   40 mg Oral BID   rivaroxaban   10 mg Oral Daily   sodium chloride  flush  3 mL Intravenous Q12H   sodium chloride  flush  3 mL Intravenous Q12H   Continuous Infusions:  lactated ringers  150 mL/hr at 06/12/24 9278   methylPREDNISolone  (SOLU-MEDROL ) injection            Sophie Mao, MD [facility] 06/12/2024, 7:30 AM   "

## 2024-06-12 NOTE — Progress Notes (Signed)
 Physical Therapy Treatment Patient Details Name: [name] MRN: [id number] DOB: 01-Apr-1976 Today's Date: 06/12/2024   History of Present Illness 49 yo female admitted on 2/3/26with HA, R side numbness/weakness, dizziness due to MS flare. MRI (+) new enhancing lesion L central peduncle. Hx of MS, anxiety, depression    PT Comments  Pt reports feeling much better today.  States she took benadryl  yesterday and it knocks her out for at least 24 hr.  She has been ambulating in room at mod I level holding IV pole.  She feels she needs UE support to help stabilize compared to her baseline of no AD.  Pt ambulating 300' with RW in hallway - did drift slightly R but no loss of balance.  Does fatigue easily and mild decrease in balance compared to baseline - will benefit from ongoing PT with recommendation updated to outpt PT.     If plan is discharge home, recommend the following: A little help with walking and/or transfers;A little help with bathing/dressing/bathroom;Assist for transportation;Help with stairs or ramp for entrance   Can travel by private vehicle        Equipment Recommendations  Rolling walker (2 wheels)    Recommendations for Other Services       Precautions / Restrictions Precautions Precautions: Fall     Mobility  Bed Mobility Overal bed mobility: Modified Independent             General bed mobility comments: Pt has been going to bathroom on her own ; demonstrated transfers safely    Transfers Overall transfer level: Modified independent Equipment used: None               General transfer comment: Demonstrated STS x 3 safely with IV pole    Ambulation/Gait Ambulation/Gait assistance: Supervision Gait Distance (Feet): 300 Feet Assistive device: IV Pole, Rolling walker (2 wheels) Gait Pattern/deviations: Step-through pattern Gait velocity: functional     General Gait Details: Pt has been ambulating in room wtih IV pole.  Demonstrated shorter  distance ambulation with IV pole without LOB but does feel she is drifting some (not noticable to therapist).  Switched to RW for longer distance.  Pt again feels drifting R - noted to be walking slightly more toward RW side of RW.  Cued for centering and getting closer to RW.  No overt LOB   Stairs             Wheelchair Mobility     Tilt Bed    Modified Rankin (Stroke Patients Only)       Balance Overall balance assessment: Needs assistance   Sitting balance-Leahy Scale: Good       Standing balance-Leahy Scale: Good                 High Level Balance Comments: Walking with head turns, stops, and U-turn without LOB with RW.  Also worked on side stepping (5 steps bil x 4) and forward/backward (5 steps each x 4).  Could stand feet together EO and EC. Attempted SLS but needs UE support and shakey.            Communication    Cognition Arousal: Alert Behavior During Therapy: WFL for tasks assessed/performed   PT - Cognitive impairments: No apparent impairments                                Cueing    Exercises  General Comments        Pertinent Vitals/Pain Pain Assessment Pain Assessment: 0-10 Pain Score: 6  Pain Location: R hand and arm (chronic shooting pains, varies at times) Pain Descriptors / Indicators: Sharp, Shooting Pain Intervention(s): Limited activity within patient's tolerance, Monitored during session, Repositioned, Other (comment) (Pt reports uses lidocaine  patches at home  - notified RN.  Also, tried heat pack for pain but discused if makes worse then stop and could consider ice)    Home Living                          Prior Function            PT Goals (current goals can now be found in the care plan section) Acute Rehab PT Goals Time For Goal Achievement: 06/26/24 Additional Goals Additional Goal #1: Pt will score >19 on DGI to indicate lower fall risk Progress towards PT goals: Progressing toward  goals    Frequency    Min 1X/week      PT Plan      Co-evaluation              AM-PAC PT 6 Clicks Mobility   Outcome Measure  Help needed turning from your back to your side while in a flat bed without using bedrails?: None Help needed moving from lying on your back to sitting on the side of a flat bed without using bedrails?: None Help needed moving to and from a bed to a chair (including a wheelchair)?: A Little Help needed standing up from a chair using your arms (e.g., wheelchair or bedside chair)?: A Little Help needed to walk in hospital room?: A Little Help needed climbing 3-5 steps with a railing? : A Little 6 Click Score: 20    End of Session Equipment Utilized During Treatment: Gait belt Activity Tolerance: Patient tolerated treatment well Patient left: in bed;with call bell/phone within reach (pt ambulating on her own to bathroom, has been declining alarm, acknowledges that if she feels dizzy will return to sitting) Nurse Communication: Mobility status PT Visit Diagnosis: Other symptoms and signs involving the nervous system (R29.898)     Time: 8566-8495 PT Time Calculation (min) (ACUTE ONLY): 31 min  Charges:    $Gait Training: 8-22 mins $Neuromuscular Re-education: 8-22 mins PT General Charges $$ ACUTE PT VISIT: 1 Visit                     Benjiman, PT Acute Rehab Services [facility] [phone]    Benjiman [name] 06/12/2024, 3:17 PM

## 2024-06-12 NOTE — Consult Note (Signed)
 NEUROLOGY CONSULT NOTE   Date of service: June 12, 2024 Patient Name: [name] MRN:  [id number] DOB:  [birth year] Chief Complaint: MS flare up Requesting Provider: Cheryle Page, MD  History of Present Illness  [name] is a 49 y.o. female with hx of obesity, HTN, Anxiety, p/w R sided weakness and numbness and clumsiness. Started around the night of 06/10/24 around 9pm.  Also reports foggy, short term memory issues over the last couple of months.  Has a hx of MS but not on DMT. She feels her MS medication could have contributed to a lung lesion.  MRI Brain and C spine with and without contrast with new enhancing L cerebral peduncle lesion.  She was started on Solumedrol 1G daily x 5 doses and neurology consulted for further evaluation and management.    ROS  Comprehensive ROS performed and pertinent positives documented in HPI   Past History   Past Medical History:  Diagnosis Date   Anxiety    BMI 30.0-30.9,adult    Depression    Hypertension    Multiple sclerosis    Uterine fibroid     Past Surgical History:  Procedure Laterality Date   ABDOMINAL HYSTERECTOMY     2010   CESAREAN SECTION     x 2    Family History: Family History  Problem Relation Age of Onset   Cancer Mother    HIV Father    Multiple sclerosis Daughter    Multiple sclerosis Cousin     Social History  reports that she has never smoked. She has never used smokeless tobacco. She reports that she does not drink alcohol  and does not use drugs.  Allergies[1]  Medications  Current Medications[2]  Vitals   Vitals:   06/11/24 1434 06/11/24 1649 06/11/24 2048 06/12/24 0435  BP: 135/73 128/66 128/76 (!) 148/94  Pulse: (!) 108 (!) 105 80 65  Resp: 17 20 18 18   Temp: 99.7 F (37.6 C) 98.6 F (37 C) 98.4 F (36.9 C) 97.6 F (36.4 C)  TempSrc: Oral Oral Oral Oral  SpO2: 96% 98% 98% 98%  Weight:      Height:        Body mass index is 35.12 kg/m.   Physical Exam    General: Laying comfortably in bed; in no acute distress.  HENT: Normal oropharynx and mucosa. Normal external appearance of ears and nose.  Neck: Supple, no pain or tenderness  CV: No JVD. No peripheral edema.  Pulmonary: Symmetric Chest rise. Normal respiratory effort.  Abdomen: Soft to touch, non-tender.  Ext: No cyanosis, edema, or deformity  Skin: No rash. Normal palpation of skin.   Musculoskeletal: Normal digits and nails by inspection. No clubbing.   Neurologic Examination  Mental status/Cognition: Alert, oriented to self, place, month and year, good attention.  Speech/language: Fluent, comprehension intact, object naming intact, repetition intact.  Cranial nerves:   CN II Pupils equal and reactive to light, no VF deficits    CN III,IV,VI EOM intact, no gaze preference or deviation, no nystagmus    CN V normal sensation in V1, V2, and V3 segments bilaterally    CN VII Slightly decreased to touch in R face.   CN VIII normal hearing to speech    CN IX & X normal palatal elevation, no uvular deviation    CN XI 5/5 head turn and 5/5 shoulder shrug bilaterally    CN XII midline tongue protrusion    Motor:  Muscle bulk: normal, tone normal,  pronator drift none, tremor none Mvmt Root Nerve  Muscle Right Left Comments  SA C5/6 Ax Deltoid 5 5   EF C5/6 Mc Biceps 5 5   EE C6/7/8 Rad Triceps 5 5   WF C6/7 Med FCR     WE C7/8 PIN ECU     F Ab C8/T1 U ADM/FDI 5 5   HF L1/2/3 Fem Illopsoas 4+ 4+   KE L2/3/4 Fem Quad 4+ 5   DF L4/5 D Peron Tib Ant 5 5   PF S1/2 Tibial Grc/Sol 5 5    Sensation:  Light touch Slightly decreased to touch in R arm.   Pin prick    Temperature    Vibration   Proprioception    Coordination/Complex Motor:  - Finger to Nose intact BL - Heel to shin intact BL - Rapid alternating movement are normal - Gait: deferred.  Labs/Imaging/Neurodiagnostic studies   CBC:  Recent Labs  Lab 07/11/2024 0920 06/12/24 0643  WBC 8.6 5.1  NEUTROABS 7.3  --    HGB 10.4* 10.3*  HCT 37.3 36.7  MCV 70.6* 70.0*  PLT 349 342   Basic Metabolic Panel:  Lab Results  Component Value Date   NA 138 06/12/2024   K 3.8 06/12/2024   CO2 23 06/12/2024   GLUCOSE 169 (H) 06/12/2024   BUN 9 06/12/2024   CREATININE 0.64 06/12/2024   CALCIUM 8.9 06/12/2024   GFRNONAA >60 06/12/2024   GFRAA >60 07/13/2017   Lipid Panel:  Lab Results  Component Value Date   LDLCALC 72 04/11/2020   HgbA1c:  Lab Results  Component Value Date   HGBA1C 5.9 (H) 04/11/2020   Urine Drug Screen: No results found for: LABOPIA, COCAINSCRNUR, LABBENZ, AMPHETMU, THCU, LABBARB  Alcohol  Level No results found for: [facility] INR  Lab Results  Component Value Date   INR 1.0 11/25/2008   APTT  Lab Results  Component Value Date   APTT 29 11/25/2008   AED levels: No results found for: PHENYTOIN, ZONISAMIDE , LAMOTRIGINE , LEVETIRACETA  MR C spine(Personally reviewed): 1. Stable focal areas of mildly increased T2 signal within the spinal cord at C2-3 centrally, on the right at C4-5, and at C5-6, without enhancement or new lesions.   MRI Brain(Personally reviewed): 1. New enhancing lesion anteromedially within the left cerebral peduncle, compatible with acute demyelination. 2. Additional right lateral midbrain lesion again noted, unchanged. 3. Mild-to-moderate periventricular white matter disease, most pronounced around the atria of the lateral ventricles, as before. 4. Stable 9 mm round enhancing nodule along the right frontoparietal vertex abutting the superior sagittal sinus, compatible with a meningioma. ASSESSMENT   [name] is a 49 y.o. female ith hx of obesity, HTN, Anxiety, MS not on DMT, p/w R sided weakness and numbness and clumsiness. Started around the night of 06/10/24 around 9pm. Found to have a new enhancing lesion in the L cerebral peduncle.  RECOMMENDATIONS  -IV Solu-Medrol  daily x 5 doses.  PPI while on steroids. She attempted PO  steroids at home with last flare up but was unable to tolerate it. - PT OT. - follow up with her outpatient neurologist in [facility] ______________________________________________________________________  Plan discussed with Dr. Cheryle and with patient at the bedside.  Signed, Jaiden Dinkins, MD [facility]     [1]  Allergies Allergen Reactions   Latex Rash, Hives and Other (See Comments)   Naproxen  Dermatitis and Other (See Comments)    Chest pain, also   Porcine (Pork) Protein-Containing Drug Products Other (See  Comments)    Religous beliefs. Ok with lovenox .   Ofatumumab  Other (See Comments)     KESIMPTA  (for MS) Fatigue/flu-like Sx  [2]  Current Facility-Administered Medications:    acetaminophen  (TYLENOL ) tablet 650 mg, 650 mg, Oral, Q6H PRN **OR** acetaminophen  (TYLENOL ) suppository 650 mg, 650 mg, Rectal, Q6H PRN, Shahmehdi, Seyed A, MD   ferrous sulfate  tablet 325 mg, 325 mg, Oral, Q breakfast, Shahmehdi, Seyed A, MD, 325 mg at 06/12/24 9061   gabapentin  (NEURONTIN ) capsule 800 mg, 800 mg, Oral, TID, Shahmehdi, Seyed A, MD, 800 mg at 06/12/24 9061   hydrALAZINE  (APRESOLINE ) injection 10 mg, 10 mg, Intravenous, Q4H PRN, Shahmehdi, Seyed A, MD   HYDROmorphone  (DILAUDID ) injection 0.5-1 mg, 0.5-1 mg, Intravenous, Q2H PRN, Shahmehdi, Seyed A, MD   ipratropium (ATROVENT ) nebulizer solution 0.5 mg, 0.5 mg, Nebulization, Q6H PRN, Shahmehdi, Adriana LABOR, MD   lactated ringers  infusion, , Intravenous, Continuous, Shahmehdi, Seyed A, MD, Last Rate: 150 mL/hr at 06/12/24 0832, Infusion Verify at 06/12/24 9167   methylPREDNISolone  sodium succinate (SOLU-MEDROL ) 1,000 mg in sodium chloride  0.9 % 50 mL IVPB, 1,000 mg, Intravenous, Daily, Michaela Aisha SQUIBB, MD, Last Rate: 66 mL/hr at 06/12/24 0941, 1,000 mg at 06/12/24 0941   ondansetron  (ZOFRAN ) tablet 4 mg, 4 mg, Oral, Q6H PRN **OR** ondansetron  (ZOFRAN ) injection 4 mg, 4 mg, Intravenous, Q6H PRN, Shahmehdi, Seyed A,  MD   Oral care mouth rinse, 15 mL, Mouth Rinse, PRN, Shahmehdi, Seyed A, MD   oxyCODONE  (Oxy IR/ROXICODONE ) immediate release tablet 5 mg, 5 mg, Oral, Q4H PRN, Shahmehdi, Seyed A, MD   pantoprazole  (PROTONIX ) EC tablet 40 mg, 40 mg, Oral, BID, Shahmehdi, Seyed A, MD, 40 mg at 06/11/24 2108   rivaroxaban  (XARELTO ) tablet 10 mg, 10 mg, Oral, Daily, James, Melissa, RPH, 10 mg at 06/12/24 9061   senna-docusate (Senokot-S) tablet 1 tablet, 1 tablet, Oral, QHS PRN, Shahmehdi, Seyed A, MD   sodium chloride  flush (NS) 0.9 % injection 3 mL, 3 mL, Intravenous, Q12H, Shahmehdi, Seyed A, MD, 3 mL at 06/11/24 2109   sodium chloride  flush (NS) 0.9 % injection 3 mL, 3 mL, Intravenous, Q12H, Shahmehdi, Seyed A, MD, 3 mL at 06/11/24 2109   sodium phosphate  (FLEET) enema 1 enema, 1 enema, Rectal, Once PRN, Shahmehdi, Seyed A, MD   traZODone  (DESYREL ) tablet 25 mg, 25 mg, Oral, QHS PRN, Shahmehdi, Seyed A, MD

## 2024-06-13 LAB — GLUCOSE, CAPILLARY
Glucose-Capillary: 130 mg/dL — ABNORMAL HIGH (ref 70–99)
Glucose-Capillary: 134 mg/dL — ABNORMAL HIGH (ref 70–99)
Glucose-Capillary: 121 mg/dL — ABNORMAL HIGH (ref 70–99)
Glucose-Capillary: 136 mg/dL — ABNORMAL HIGH (ref 70–99)

## 2024-06-13 MED ORDER — KETOROLAC TROMETHAMINE 30 MG/ML IJ SOLN
30.0000 mg | Freq: Four times a day (QID) | INTRAMUSCULAR | Status: AC | PRN
  Administered 2024-06-13: 30 mg via INTRAVENOUS
  Filled 2024-06-13: qty 1

## 2024-06-13 MED ORDER — SUMATRIPTAN SUCCINATE 50 MG PO TABS
100.0000 mg | ORAL_TABLET | ORAL | Status: AC | PRN
  Administered 2024-06-13 – 2024-06-14 (×2): 100 mg via ORAL
  Filled 2024-06-13 (×4): qty 2

## 2024-06-13 NOTE — Plan of Care (Signed)

## 2024-06-13 NOTE — Plan of Care (Signed)
   Problem: Health Behavior/Discharge Planning: Goal: Ability to manage health-related needs will improve Outcome: Progressing   Problem: Clinical Measurements: Goal: Ability to maintain clinical measurements within normal limits will improve Outcome: Progressing Goal: Will remain free from infection Outcome: Progressing Goal: Diagnostic test results will improve Outcome: Progressing

## 2024-06-13 NOTE — TOC Progression Note (Signed)
 Transition of Care [facility]) - Progression Note    Patient Details  Name: [name] MRN: [id number] Date of Birth: [birth year]  Transition of Care [facility]) CM/SW Contact  Toy LITTIE Agar, RN Phone Number:[phone]  06/13/2024, 4:01 PM  Clinical Narrative:    Inpatient care manager acknowledges consult for Home Health / DME Needs. Currently there are no HH or DME needs noted.                      Expected Discharge Plan and Services                                               Social Drivers of Health (SDOH) Interventions SDOH Screenings   Food Insecurity: No Food Insecurity (06/11/2024)  Housing: Low Risk (06/11/2024)  Transportation Needs: No Transportation Needs (06/11/2024)  Utilities: Not At Risk (06/11/2024)  Depression (PHQ2-9): High Risk (06/23/2021)  Tobacco Use: Low Risk (06/11/2024)    Readmission Risk Interventions     No data to display

## 2024-06-13 NOTE — Progress Notes (Signed)
 " PROGRESS NOTE    [name]  FMW:[id number] DOB: [birth year] DOA: 06/11/2024 PCP: Loris Elsie PARAS, PA-C   Brief Narrative:  49 year old female with history of multiple sclerosis, hypertension, anxiety presented with right-sided weakness and numbness.  On presentation, MRI of the brain showed numerous enhancing lesion anteromedially within the left cerebral peduncle, compatible with acute demyelination.  Neurology was consulted: started on high-dose Solu-Medrol .  Assessment & Plan:   Possible multiple sclerosis flare present right-sided weakness and numbness -presented with right-sided weakness and numbness.  On presentation, MRI of the brain showed numerous enhancing lesion anteromedially within the left cerebral peduncle, compatible with acute demyelination.  Neurology was consulted: started on high-dose Solu-Medrol .  Neurology is recommending 5 days of Solu-Medrol  - PT is recommending outpatient PT.  OT is recommending home health OT. - Continue gabapentin   Orthostatic hypotension-improved  Microcytic anemia -Hemoglobin stable.  Continue oral iron supplementation  Obesity class II - Outpatient follow-up  DVT prophylaxis: Xarelto  Code Status: Full Family Communication: None at bedside Disposition Plan: Status is: inpatient because: of severity of illness    Consultants: Neurology  Procedures: None  Antimicrobials: None   Subjective: Patient seen and examined at bedside.  States that her right-sided weakness and numbness is slightly better.  No fever or vomiting reported.  Complains of migraine.  Objective: Vitals:   06/12/24 1259 06/12/24 1404 06/12/24 2000 06/13/24 0423  BP: (!) 182/105 (!) 155/86 (!) 178/119 136/71  Pulse:  (!) 108 96 88  Resp:   16 18  Temp:   98.4 F (36.9 C) 97.8 F (36.6 C)  TempSrc:   Oral Oral  SpO2:   96% 99%  Weight:      Height:        Intake/Output Summary (Last 24 hours) at 06/13/2024 0821 Last data filed at 06/12/2024  1300 Gross per 24 hour  Intake 2436.97 ml  Output --  Net 2436.97 ml   Filed Weights   06/11/24 0841  Weight: 104.8 kg    Examination:  General: On room air.  No distress.  respiratory: Decreased breath sounds at bases bilaterally with some crackles CVS: Currently rate controlled; S1-S2 heard  abdominal: Soft, obese, nontender, slightly distended, no organomegaly; normal bowel sounds are heard  extremities: Trace lower extremity edema; no clubbing.       Data Reviewed: I have personally reviewed following labs and imaging studies  CBC: Recent Labs  Lab 06/11/24 0920 06/12/24 0643  WBC 8.6 5.1  NEUTROABS 7.3  --   HGB 10.4* 10.3*  HCT 37.3 36.7  MCV 70.6* 70.0*  PLT 349 342   Basic Metabolic Panel: Recent Labs  Lab 06/11/24 0920 06/11/24 1655 06/12/24 0643  NA 138  --  138  K 3.8  --  3.8  CL 103  --  107  CO2 26  --  23  GLUCOSE 107*  --  169*  BUN 9  --  9  CREATININE 0.88  --  0.64  CALCIUM 9.0  --  8.9  MG 1.9 2.1  --   PHOS  --  2.7  --    GFR: Estimated Creatinine Clearance: 111.4 mL/min (by C-G formula based on SCr of 0.64 mg/dL). Liver Function Tests: Recent Labs  Lab 06/11/24 0920  AST 18  ALT 10  ALKPHOS 76  BILITOT 0.4  PROT 7.1  ALBUMIN 4.0   Recent Labs  Lab 06/11/24 0920  LIPASE 30   No results for input(s): AMMONIA in the last 168 hours.  Coagulation Profile: No results for input(s): INR, PROTIME in the last 168 hours. Cardiac Enzymes: No results for input(s): CKTOTAL, CKMB, CKMBINDEX, TROPONINI in the last 168 hours. BNP (last 3 results) Recent Labs    06/11/24 1655  PROBNP 251.0   HbA1C: Recent Labs    06/12/24 0643  HGBA1C 5.9*   CBG: Recent Labs  Lab 06/12/24 1628 06/12/24 2144 06/13/24 0721  GLUCAP 205* 183* 130*   Lipid Profile: No results for input(s): CHOL, HDL, LDLCALC, TRIG, CHOLHDL, LDLDIRECT in the last 72 hours. Thyroid Function Tests: Recent Labs    06/11/24 1655   TSH 0.319*  FREET4 0.88   Anemia Panel: No results for input(s): VITAMINB12, FOLATE, FERRITIN, TIBC, IRON, RETICCTPCT in the last 72 hours. Sepsis Labs: No results for input(s): PROCALCITON, LATICACIDVEN in the last 168 hours.  No results found for this or any previous visit (from the past 240 hours).       Radiology Studies: MR Cervical Spine W and Wo Contrast Result Date: 06/11/2024 EXAM: MRI CERVICAL SPINE WITH AND WITHOUT CONTRAST 06/11/2024 12:32:17 PM TECHNIQUE: Multiplanar multisequence MRI of the cervical spine was performed without and with the administration of 10 mL gadobutrol  (GADAVIST ) 1 MMOL/ML injection. COMPARISON: MRI of the cervical spine dated 08/05/2023. CLINICAL HISTORY: possible MS flare Possible multiple sclerosis flare. FINDINGS: BONES AND ALIGNMENT: The cervical vertebrae maintain their height and alignment. Marrow signal is unremarkable. No abnormal enhancement. There is no significant interval change. SPINAL CORD: Normal spinal cord size. There are focal areas of mildly increased T2 signal again demonstrated within the spinal cord at C2-C3 centrally and on the right at C4-C5, which is evident on image 16 of the axial series. A subtle area of increased T2 STIR signal is also seen at the C5-C6 level, axial image 20, also similar to the prior study. There are no new or enhancing lesions present. There is no significant interval change. SOFT TISSUES: No paraspinal mass. C2-C3: No significant disc herniation. No spinal canal stenosis or neural foraminal narrowing. C3-C4: No significant disc herniation. No spinal canal stenosis or neural foraminal narrowing. C4-C5: No significant disc herniation. No spinal canal stenosis or neural foraminal narrowing. C5-C6: No significant disc herniation. No spinal canal stenosis or neural foraminal narrowing. C6-C7: No significant disc herniation. No spinal canal stenosis or neural foraminal narrowing. C7-T1: No significant  disc herniation. No spinal canal stenosis or neural foraminal narrowing. IMPRESSION: 1. Stable focal areas of mildly increased T2 signal within the spinal cord at C2-3 centrally, on the right at C4-5, and at C5-6, without enhancement or new lesions. Electronically signed by: Evalene Coho MD 06/11/2024 01:05 PM EST RP Workstation: [id number]   MR Brain W and Wo Contrast Result Date: 06/11/2024 EXAM: MRI BRAIN WITH AND WITHOUT CONTRAST 06/11/2024 12:32:17 PM TECHNIQUE: Multiplanar multisequence MRI of the head/brain was performed with and without the administration of intravenous contrast. COMPARISON: MRI of the head dated 08/05/2023. CLINICAL HISTORY: possible MS flare Possible multiple sclerosis flare. FINDINGS: BRAIN AND VENTRICLES: No acute infarct. No acute intracranial hemorrhage. No mass effect or midline shift. No hydrocephalus. The sella is unremarkable. Normal flow voids. There is a new lesion present anteromedially within the left cerebral peduncle, which is mildly hyperintense on diffusion and T2 FLAIR and demonstrates contrast enhancement, compatible with acute demyelination. It is seen on image 21 of series 10 and image 66 of series 18. There is age-related atrophy. There is mild-to-moderate periventricular white matter disease, most pronounced around the atria of the lateral ventricles, as  before. A lesion within the right lateral midbrain is also again noted. A round enhancing nodule measuring approximately 9 mm in diameter is again seen along the falx at the right frontoparietal vertex. It abuts the superior sagittal sinus and appears unchanged in the interim. It is compatible with a meningioma. ORBITS: No significant abnormality. SINUSES: No significant abnormality. BONES AND SOFT TISSUES: Normal bone marrow signal. No soft tissue abnormality. IMPRESSION: 1. New enhancing lesion anteromedially within the left cerebral peduncle, compatible with acute demyelination. 2. Additional right lateral  midbrain lesion again noted, unchanged. 3. Mild-to-moderate periventricular white matter disease, most pronounced around the atria of the lateral ventricles, as before. 4. Stable 9 mm round enhancing nodule along the right frontoparietal vertex abutting the superior sagittal sinus, compatible with a meningioma. Electronically signed by: Evalene Coho MD 06/11/2024 12:55 PM EST RP Workstation: [id number]   DG Chest Portable 1 View Result Date: 06/11/2024 CLINICAL DATA:  Chest pain EXAM: PORTABLE CHEST 1 VIEW COMPARISON:  Chest radiograph dated 01/06/2024 FINDINGS: Normal lung volumes. Similar hazy left retrocardiac opacity. Unchanged nodule in the left mid lung. No pleural effusion or pneumothorax. The heart size and mediastinal contours are within normal limits. No acute osseous abnormality. IMPRESSION: No active disease. Electronically Signed   By: Limin  Xu M.D.   On: 06/11/2024 09:22        Scheduled Meds:  ferrous sulfate   325 mg Oral Q breakfast   gabapentin   800 mg Oral TID   insulin  aspart  0-20 Units Subcutaneous TID WC   insulin  aspart  0-5 Units Subcutaneous QHS   lidocaine   1 patch Transdermal Q24H   pantoprazole   40 mg Oral BID   rivaroxaban   10 mg Oral Daily   sodium chloride  flush  3 mL Intravenous Q12H   sodium chloride  flush  3 mL Intravenous Q12H   Continuous Infusions:  methylPREDNISolone  (SOLU-MEDROL ) injection 1,000 mg (06/12/24 0941)          Sophie Mao, MD [facility] 06/13/2024, 8:21 AM   "

## 2024-06-13 NOTE — Progress Notes (Signed)
" °   06/13/24 1558  TOC Brief Assessment  Insurance and Status Reviewed;Lapsed  Patient has primary care physician Yes Jonell, Elsie PARAS, PA-C)  Home environment has been reviewed From home  Prior level of function: Independent  Prior/Current Home Services No current home services  Social Drivers of Health Review SDOH reviewed no interventions necessary  Readmission risk has been reviewed Yes  Transition of care needs no transition of care needs at this time    "

## 2024-06-13 NOTE — Progress Notes (Signed)
 Occupational Therapy Treatment Patient Details Name: [name] MRN: [id number] DOB: November 02, 1975 Today's Date: 06/13/2024   History of present illness 49 yo female admitted on 06/11/24 with HA, R side numbness/weakness, dizziness due to MS flare. MRI (+) new enhancing lesion L central peduncle. Hx of MS, anxiety, depression.   OT comments  Pt seen for OT intervention session this date focused on education, energy conservation strategies, AE/DME recommendations and hand exercises. Pt provided with handouts, theraputty & foam block for R hand weakness. Edu on energy conservation focusing on falls prevention, safe shower techniques, and use of AE to support independence during ADL/IADL. Plan to bring LB ADL kit next session for dressing. Recommend that pt receive continued outpatient neuro OT at discharge. OT will follow acutely.       If plan is discharge home, recommend the following:  A lot of help with walking and/or transfers;A lot of help with bathing/dressing/bathroom;Assistance with cooking/housework;Direct supervision/assist for medications management;Direct supervision/assist for financial management;Assist for transportation;Help with stairs or ramp for entrance;Supervision due to cognitive status   Equipment Recommendations  Tub/shower bench (HH shower head, ADL AE hip kit reacher, sock aide, dressing stick, shoe funnel)    Recommendations for Other Services  Outpatient neuro OT     Precautions / Restrictions Precautions Precautions: Fall Precaution/Restrictions Comments: monitor BP, dizziness Restrictions Weight Bearing Restrictions Per Provider Order: No       Mobility Bed Mobility Overal bed mobility: Modified Independent Bed Mobility: Supine to Sit           General bed mobility comments: Pt has been going to bathroom on her own; demonstrated transfers safely    Transfers Overall transfer level: Modified independent Equipment used: None                      Balance Overall balance assessment: Needs assistance   Sitting balance-Leahy Scale: Good                                     ADL either performed or assessed with clinical judgement   ADL Overall ADL's : Needs assistance/impaired                         Toilet Transfer: Modified Independent Toilet Transfer Details (indicate cue type and reason): pt has been completing toileting tasks in room mod independent (ambulating to bathroom end of session) Toileting- Clothing Manipulation and Hygiene: Modified independent                Cognition Arousal: Alert Behavior During Therapy: WFL for tasks assessed/performed Cognition: No apparent impairments             OT - Cognition Comments: WFL for tasks assessed, will continue to assess                 Following commands: Intact        Cueing   Cueing Techniques: Verbal cues  Exercises Other Exercises Other Exercises: provided pt the following handouts: energy conservation, ADL AE equiptment, shower / toileting DME and theraputty handouts Other Exercises: discussed energy conservation strategies with handout provided; edu on DME recommendations for shower. Pt has a tub/shower at home, recommend TTB with HH shower head. Discussed additional GB installation on wall with shower grip stickers for fall prevention. Other Exercises: Discussed AE for LB dressing including reacher, sock aide, shoe funnel, dressing  stick. Pt eager try AE, will plan to bring ADL bag next session. Other Exercises: edu on ergonomics and body mechanics for home office setup. recommended that pt take pictures of home setup for outpatient OT to suggest chair / desk setup. Suggested ergonomic mice given R hand dominance and R hand weakness from MS. Provided pt with putty and foam block for R hand exercises in hand manipulation, coordination, dexterity to support ADL/IADL bimanual skills.  Other Exercises: Recommended pt attend  outpatient neurologic specific OT for therapy.            Pertinent Vitals/ Pain       Pain Assessment Pain Assessment: 0-10 Pain Score: 8  Pain Location: back, neck, bilateral shoulders Pain Descriptors / Indicators: Sharp, Shooting Pain Intervention(s): Limited activity within patient's tolerance, Monitored during session, Patient requesting pain meds-RN notified, Heat applied (pt recieved with heat packs applied)   Frequency  Min 2X/week        Progress Toward Goals  OT Goals(current goals can now be found in the care plan section)  Progress towards OT goals: Progressing toward goals  Acute Rehab OT Goals OT Goal Formulation: With patient Time For Goal Achievement: 06/25/24 Potential to Achieve Goals: Good  Plan         AM-PAC OT 6 Clicks Daily Activity     Outcome Measure   Help from another person eating meals?: None Help from another person taking care of personal grooming?: A Little Help from another person toileting, which includes using toliet, bedpan, or urinal?: A Little Help from another person bathing (including washing, rinsing, drying)?: A Little Help from another person to put on and taking off regular upper body clothing?: A Little Help from another person to put on and taking off regular lower body clothing?: A Little 6 Click Score: 19    End of Session    OT Visit Diagnosis: Unsteadiness on feet (R26.81);Muscle weakness (generalized) (M62.81);Other symptoms and signs involving the nervous system (R29.898);Cognitive communication deficit (R41.841);Pain Symptoms and signs involving cognitive functions: Other cerebrovascular disease (MS) Pain - part of body: Hand;Shoulder;Arm (back, shoulders)   Activity Tolerance Patient tolerated treatment well   Patient Left Other (comment) (in bathroom)   Nurse Communication Mobility status;Patient requests pain meds        Time: 8565-8495 OT Time Calculation (min): 30 min  Charges: OT General  Charges $OT Visit: 1 Visit OT Treatments $Self Care/Home Management : 23-37 mins  [name] L. Tonimarie Gritz, OTR/L  06/13/24, 3:18 PM

## 2024-06-14 ENCOUNTER — Inpatient Hospital Stay (HOSPITAL_COMMUNITY)

## 2024-06-14 LAB — GLUCOSE, CAPILLARY
Glucose-Capillary: 100 mg/dL — ABNORMAL HIGH (ref 70–99)
Glucose-Capillary: 91 mg/dL (ref 70–99)
Glucose-Capillary: 222 mg/dL — ABNORMAL HIGH (ref 70–99)
Glucose-Capillary: 210 mg/dL — ABNORMAL HIGH (ref 70–99)

## 2024-06-14 MED ORDER — AMLODIPINE BESYLATE 5 MG PO TABS
5.0000 mg | ORAL_TABLET | Freq: Every day | ORAL | Status: AC
  Administered 2024-06-14: 5 mg via ORAL
  Filled 2024-06-14: qty 1

## 2024-06-14 MED ORDER — SODIUM CHLORIDE 0.9 % IV SOLN
1000.0000 mg | INTRAVENOUS | Status: AC
  Administered 2024-06-14: 1000 mg via INTRAVENOUS
  Filled 2024-06-14: qty 16

## 2024-06-14 MED ORDER — METOCLOPRAMIDE HCL 5 MG/ML IJ SOLN
10.0000 mg | Freq: Once | INTRAMUSCULAR | Status: AC
  Administered 2024-06-14: 10 mg via INTRAVENOUS
  Filled 2024-06-14: qty 2

## 2024-06-14 NOTE — Progress Notes (Signed)
 NEUROLOGY CONSULT FOLLOW UP NOTE   Date of service: June 14, 2024 Patient Name: [name] MRN:  [id number] DOB:  [birth year]  Interval Hx/subjective   She had a transient episode of left-sided tingling earlier today, and had experienced some since the previous day.  Currently is much improved.  During this episode, her blood pressures were over 200 systolic.  She also complains of unilateral headache, consistent with her previous migraines.  Vitals   Vitals:   06/14/24 1222 06/14/24 1248 06/14/24 1410 06/14/24 1718  BP: (!) 166/96 (!) 146/63 (!) 141/73 (!) 147/85  Pulse:   91 93  Resp:      Temp:   97.9 F (36.6 C)   TempSrc:   Oral   SpO2:   94% 99%  Weight:      Height:         Body mass index is 34.39 kg/m.  Physical Exam   Constitutional: Appears well-developed and well-nourished.  Neurologic Examination    MS: Awake, alert, interactive and appropriate CN: EOMI, VFF, sensation is decreased on the left side of her face Motor: She has mild weakness of the right leg Sensory: Diminished in the right arm and leg  Medications Current Medications[1]  Labs and Diagnostic Imaging   Glucose 100  Assessment   [name] is a 49 y.o. female admitted with MS failure, currently getting Solu-Medrol .  The episode of left-sided paresthesia that she had earlier today could be consistent with hypertensive emergency, complicated migraine, but also in the setting of a blood pressure that high I would favor ruling out of acute small stroke with MRI.  If her MRI is negative, no further workup would be needed and she can be discharged after her last dose of steroids tomorrow.  I doubt that the left-sided paresthesia is related to her MS, but even if it were we would not pursue further treatment past the fifth dose of steroids.  We will plan to follow-up her MRI tomorrow, but will only plan to see her if it is positive.  Recommendations  Reglan  10 mg x 1 for  headache MRI brain w/o If MRI is normal she can be discharged following her last dose of steroids tomorrow  If MRI is negative, neurology will be available as needed and she can follow-up with her outpatient neurologist. ______________________________________________________________________   Signed, Aisha Seals, MD [facility]      [1]  Current Facility-Administered Medications:    acetaminophen  (TYLENOL ) tablet 650 mg, 650 mg, Oral, Q6H PRN, 650 mg at 06/12/24 1926 **OR** acetaminophen  (TYLENOL ) suppository 650 mg, 650 mg, Rectal, Q6H PRN, Shahmehdi, Seyed A, MD   alum & mag hydroxide-simeth (MAALOX/MYLANTA) 200-200-20 MG/5ML suspension 15 mL, 15 mL, Oral, Q4H PRN, Cheryle, Kshitiz, MD, 15 mL at 06/14/24 1025   amLODipine  (NORVASC ) tablet 5 mg, 5 mg, Oral, Daily, Alekh, Kshitiz, MD, 5 mg at 06/14/24 1259   ferrous sulfate  tablet 325 mg, 325 mg, Oral, Q breakfast, Shahmehdi, Seyed A, MD, 325 mg at 06/14/24 9176   gabapentin  (NEURONTIN ) capsule 800 mg, 800 mg, Oral, TID, Shahmehdi, Seyed A, MD, 800 mg at 06/14/24 1649   hydrALAZINE  (APRESOLINE ) injection 10 mg, 10 mg, Intravenous, Q4H PRN, Shahmehdi, Seyed A, MD, 10 mg at 06/14/24 1210   HYDROmorphone  (DILAUDID ) injection 0.5-1 mg, 0.5-1 mg, Intravenous, Q2H PRN, Shahmehdi, Seyed A, MD, 1 mg at 06/13/24 1529   insulin  aspart (novoLOG ) injection 0-20 Units, 0-20 Units, Subcutaneous, TID WC, Alekh, Kshitiz, MD, 7 Units at 06/14/24  1649   insulin  aspart (novoLOG ) injection 0-5 Units, 0-5 Units, Subcutaneous, QHS, Alekh, Kshitiz, MD   ipratropium (ATROVENT ) nebulizer solution 0.5 mg, 0.5 mg, Nebulization, Q6H PRN, Shahmehdi, Seyed A, MD   ketorolac  (TORADOL ) 30 MG/ML injection 30 mg, 30 mg, Intravenous, Q6H PRN, Cheryle, Kshitiz, MD, 30 mg at 06/13/24 1113   lidocaine  (LIDODERM ) 5 % 1 patch, 1 patch, Transdermal, Q24H, Alekh, Kshitiz, MD, 1 patch at 06/14/24 1749   methylPREDNISolone  sodium succinate (SOLU-MEDROL ) 1,000 mg in  sodium chloride  0.9 % 50 mL IVPB, 1,000 mg, Intravenous, Q24H, Michaela Aisha SQUIBB, MD, Last Rate: 66 mL/hr at 06/14/24 1031, 1,000 mg at 06/14/24 1031   metoCLOPramide  (REGLAN ) injection 10 mg, 10 mg, Intravenous, Once, Donzella Carrol, Aisha SQUIBB, MD   ondansetron  (ZOFRAN ) tablet 4 mg, 4 mg, Oral, Q6H PRN **OR** ondansetron  (ZOFRAN ) injection 4 mg, 4 mg, Intravenous, Q6H PRN, Shahmehdi, Seyed A, MD, 4 mg at 06/13/24 1735   Oral care mouth rinse, 15 mL, Mouth Rinse, PRN, Shahmehdi, Seyed A, MD   oxyCODONE  (Oxy IR/ROXICODONE ) immediate release tablet 5 mg, 5 mg, Oral, Q4H PRN, Shahmehdi, Seyed A, MD, 5 mg at 06/13/24 9271   pantoprazole  (PROTONIX ) EC tablet 40 mg, 40 mg, Oral, BID, Shahmehdi, Seyed A, MD, 40 mg at 06/14/24 1020   rivaroxaban  (XARELTO ) tablet 10 mg, 10 mg, Oral, Daily, James, Melissa, RPH, 10 mg at 06/14/24 1020   senna-docusate (Senokot-S) tablet 1 tablet, 1 tablet, Oral, QHS PRN, Shahmehdi, Seyed A, MD   sodium chloride  flush (NS) 0.9 % injection 3 mL, 3 mL, Intravenous, Q12H, Shahmehdi, Seyed A, MD, 3 mL at 06/14/24 1022   sodium chloride  flush (NS) 0.9 % injection 3 mL, 3 mL, Intravenous, Q12H, Shahmehdi, Seyed A, MD, 3 mL at 06/14/24 1022   sodium phosphate  (FLEET) enema 1 enema, 1 enema, Rectal, Once PRN, Shahmehdi, Seyed A, MD   SUMAtriptan  (IMITREX ) tablet 100 mg, 100 mg, Oral, Q2H PRN, Cheryle, Kshitiz, MD, 100 mg at 06/14/24 1130   traZODone  (DESYREL ) tablet 25 mg, 25 mg, Oral, QHS PRN, Shahmehdi, Seyed A, MD

## 2024-06-14 NOTE — Progress Notes (Signed)
 Rapid Response Event Note  Upon entering pts room she stated that she felt weird some numbness and tingling in left side of face. This nurse obtained vital signs, BP read 206/120, Charge nurse regina notified, MD kirkpatrick notified. Rapid response called @1206 .  10 mg IV hydralazine  given (1210), BP read 171/102 (1215), 166/96  (1222). BP cuff changed to correct size cuff. Pt taken for STAT head CT.  Most recent blood pressure 146/63 (1248). Amlodipine  5mg  tab given (1259). Pt resting comfortably, will continue to monitor.  Reason for Call :  Change in status/BP elevated   Interventions:  IV Hydralazine  10 mg given @ 1210 STAT head CT ordered by MD Michaela Isaiah KANDICE Theo, LPN

## 2024-06-14 NOTE — Progress Notes (Signed)
 Occupational Therapy Treatment Patient Details Name: [name] MRN: [id number] DOB: [birth year] Today's Date: 06/14/2024   History of present illness 49 yo female admitted on 06/11/24 with HA, R side numbness/weakness, dizziness due to MS flare. MRI (+) new enhancing lesion L central peduncle. Hx of MS, anxiety, depression.   OT comments  Pt seen for OT treatment session focusing on education and instruction of AE/DME use for continued independence in ADL/IADL performance. Provided pt with reacher, sock aide, leg lifter and pt able to perform LB dressing to don socks + underwear with verbal cues + MIN A. Continued reinforcement of energy conservation including use of 4WW vs RW to utilize seat for rest breaks while out in community. See below for DME recommendations. Pt will benefit from neuro specific outpatient OT services at hospital discharge.       If plan is discharge home, recommend the following:  A lot of help with walking and/or transfers;A lot of help with bathing/dressing/bathroom;Assistance with cooking/housework;Direct supervision/assist for medications management;Direct supervision/assist for financial management;Assist for transportation;Help with stairs or ramp for entrance;Supervision due to cognitive status   Equipment Recommendations   (HH shower head, 4WW, TTB, dressing stick, shoe funnel)       Precautions / Restrictions Precautions Precautions: Fall Precaution/Restrictions Comments: monitor BP, dizziness Restrictions Weight Bearing Restrictions Per Provider Order: No       Mobility Bed Mobility Overal bed mobility: Modified Independent                  Transfers Overall transfer level: Modified independent Equipment used: Rolling walker (2 wheels)               General transfer comment: discussed use of RW for safe transfer technique for energy conservation, visual demo of hand placement     Balance Overall balance assessment: Needs  assistance   Sitting balance-Leahy Scale: Good Sitting balance - Comments: no LOB while performing seated ADLs EOB                                   ADL either performed or assessed with clinical judgement   ADL Overall ADL's : Needs assistance/impaired                     Lower Body Dressing: Minimal assistance;Sitting/lateral leans;With adaptive equipment Lower Body Dressing Details (indicate cue type and reason): provided pt with sock aide, reacher and leg lifter. discussed use of AE for LB dressing. pt able to return demo donning sock with sock aide, donning LB clothing items with reacher and use of leg lifter for bed mobility                     Communication Communication Communication: No apparent difficulties   Cognition Arousal: Alert Behavior During Therapy: [facility] for tasks assessed/performed Cognition: No apparent impairments       Memory impairment (select all impairments): Working memory     OT - Cognition Comments: pt endorsing memory issues. cognition WFL for tasks assessed during session.                 Following commands: Intact        Cueing   Cueing Techniques: Verbal cues  Exercises Other Exercises Other Exercises: continued education on energy conservation techniques including use of RW vs 4WW with seat for rest breaks, recommended appropriate AE for continued ADL / IADL independence  Other Exercises: Recommended pt attend outpatient neurologic specific OT for therapy.            Pertinent Vitals/ Pain       Pain Assessment Pain Assessment: Faces Faces Pain Scale: Hurts a little bit Pain Location: back, neck, bilateral shoulders Pain Descriptors / Indicators: Sharp, Shooting Pain Intervention(s): Limited activity within patient's tolerance, Monitored during session   Frequency  Min 2X/week        Progress Toward Goals  OT Goals(current goals can now be found in the care plan section)  Progress  towards OT goals: Progressing toward goals  Acute Rehab OT Goals OT Goal Formulation: With patient Time For Goal Achievement: 06/25/24 Potential to Achieve Goals: Good  Plan         AM-PAC OT 6 Clicks Daily Activity     Outcome Measure   Help from another person eating meals?: None Help from another person taking care of personal grooming?: A Little Help from another person toileting, which includes using toliet, bedpan, or urinal?: A Little Help from another person bathing (including washing, rinsing, drying)?: A Little Help from another person to put on and taking off regular upper body clothing?: A Little Help from another person to put on and taking off regular lower body clothing?: A Little 6 Click Score: 19    End of Session    OT Visit Diagnosis: Unsteadiness on feet (R26.81);Muscle weakness (generalized) (M62.81);Other symptoms and signs involving the nervous system (R29.898);Cognitive communication deficit (R41.841);Pain Symptoms and signs involving cognitive functions: Other cerebrovascular disease (MS) Pain - part of body: Hand;Shoulder;Arm   Activity Tolerance Patient tolerated treatment well   Patient Left in bed;with call bell/phone within reach   Nurse Communication Mobility status        Time: 9067-8996 OT Time Calculation (min): 31 min  Charges: OT General Charges $OT Visit: 1 Visit OT Treatments $Self Care/Home Management : 23-37 mins  Ardie Mclennan L. Adonai Helzer, OTR/L  06/14/24, 10:33 AM

## 2024-06-14 NOTE — Significant Event (Signed)
 Rapid Response Event Note   Reason for Call :  HTN and tingling/numbness in LUE.   Initial Focused Assessment:  Patient laying in bed, texting, alert and oriented x4. Called for BP of 206/120, PRN hydralazine  administered between call and rapid response arrival. Noted pt was wearing incorrect size BP cuff, exchanged for a red cuff. BP with new cuff was 166/96. Patient reports taking amlodipine  regularly at home, has not taken it in a few days Patient reported that tingling in left arm began yesterday, but noticed tingling/decreased sensation to left side of face starting around 1200 today. Strength equal in bilateral upper and lower extremities, no drift noted bilateral upper and lower extremities.  Dr. Michaela notified of tingling in LUE and face. Stated that since patient was experiencing left sided symptoms yesterday, no need to call code stroke. STAT head CT ordered, bedside nurse coordinating with CT when rapid response concluded.   Interventions:  10mg  PRN hydralazine  administered Stat head CT  Plan of Care:  Obtain head CT Continue to monitor BP  Event Summary:   MD Notified: Dr. Cheryle & Dr. Michaela Call Time: 1206 Arrival Time: 1210 End Time: 1229  Santana Nageotte, RN

## 2024-06-14 NOTE — Progress Notes (Signed)
 " PROGRESS NOTE    [name]  FMW:[id number] DOB: [birth year] DOA: 06/11/2024 PCP: Loris Elsie PARAS, PA-C   Brief Narrative:  49 year old female with history of multiple sclerosis, hypertension, anxiety presented with right-sided weakness and numbness.  On presentation, MRI of the brain showed numerous enhancing lesion anteromedially within the left cerebral peduncle, compatible with acute demyelination.  Neurology was consulted: started on high-dose Solu-Medrol .  Assessment & Plan:   Possible multiple sclerosis flare present right-sided weakness and numbness -presented with right-sided weakness and numbness.  On presentation, MRI of the brain showed numerous enhancing lesion anteromedially within the left cerebral peduncle, compatible with acute demyelination.  Neurology was consulted: started on high-dose Solu-Medrol .  Neurology is recommending 5 days of Solu-Medrol  - PT/OT is recommending outpatient PT/OT.   - Continue gabapentin   Orthostatic hypotension-improved  Microcytic anemia -Hemoglobin stable.  Continue oral iron supplementation  Obesity class II - Outpatient follow-up  DVT prophylaxis: Xarelto  Code Status: Full Family Communication: None at bedside Disposition Plan: Status is: inpatient because: of severity of illness    Consultants: Neurology  Procedures: None  Antimicrobials: None   Subjective: Patient seen and examined at bedside.  Headache is improving.  Still having right-sided weakness and numbness.  No fever or vomiting reported  Objective: Vitals:   06/13/24 0423 06/13/24 1340 06/13/24 2100 06/14/24 0507  BP: 136/71 (!) 162/106 (!) 175/101 (!) 145/76  Pulse: 88 75 61 72  Resp: 18 16 18 18   Temp: 97.8 F (36.6 C) 97.7 F (36.5 C) 97.6 F (36.4 C) 97.7 F (36.5 C)  TempSrc: Oral Oral Oral Oral  SpO2: 99% 99% 99% 98%  Weight:    102.6 kg  Height:        Intake/Output Summary (Last 24 hours) at 06/14/2024 9187 Last data filed at 06/13/2024  1800 Gross per 24 hour  Intake 1080 ml  Output --  Net 1080 ml   Filed Weights   06/11/24 0841 06/14/24 0507  Weight: 104.8 kg 102.6 kg    Examination:  General: Remains on room air and in no distress respiratory: Bilateral decreased breath sounds at bases with scattered crackles CVS: S1 and S2 are heard; rate mostly controlled abdominal: Soft, obese, nontender, distended mildly; no organomegaly; bowel sounds are heard normally  extremities: No cyanosis or clubbing     Data Reviewed: I have personally reviewed following labs and imaging studies  CBC: Recent Labs  Lab 06/11/24 0920 06/12/24 0643  WBC 8.6 5.1  NEUTROABS 7.3  --   HGB 10.4* 10.3*  HCT 37.3 36.7  MCV 70.6* 70.0*  PLT 349 342   Basic Metabolic Panel: Recent Labs  Lab 06/11/24 0920 06/11/24 1655 06/12/24 0643  NA 138  --  138  K 3.8  --  3.8  CL 103  --  107  CO2 26  --  23  GLUCOSE 107*  --  169*  BUN 9  --  9  CREATININE 0.88  --  0.64  CALCIUM 9.0  --  8.9  MG 1.9 2.1  --   PHOS  --  2.7  --    GFR: Estimated Creatinine Clearance: 110.1 mL/min (by C-G formula based on SCr of 0.64 mg/dL). Liver Function Tests: Recent Labs  Lab 06/11/24 0920  AST 18  ALT 10  ALKPHOS 76  BILITOT 0.4  PROT 7.1  ALBUMIN 4.0   Recent Labs  Lab 06/11/24 0920  LIPASE 30   No results for input(s): AMMONIA in the last 168 hours.  Coagulation Profile: No results for input(s): INR, PROTIME in the last 168 hours. Cardiac Enzymes: No results for input(s): CKTOTAL, CKMB, CKMBINDEX, TROPONINI in the last 168 hours. BNP (last 3 results) Recent Labs    06/11/24 1655  PROBNP 251.0   HbA1C: Recent Labs    06/12/24 0643  HGBA1C 5.9*   CBG: Recent Labs  Lab 06/13/24 0721 06/13/24 1124 06/13/24 1700 06/13/24 2102 06/14/24 0740  GLUCAP 130* 134* 121* 136* 100*   Lipid Profile: No results for input(s): CHOL, HDL, LDLCALC, TRIG, CHOLHDL, LDLDIRECT in the last 72  hours. Thyroid Function Tests: Recent Labs    06/11/24 1655  TSH 0.319*  FREET4 0.88   Anemia Panel: No results for input(s): VITAMINB12, FOLATE, FERRITIN, TIBC, IRON, RETICCTPCT in the last 72 hours. Sepsis Labs: No results for input(s): PROCALCITON, LATICACIDVEN in the last 168 hours.  No results found for this or any previous visit (from the past 240 hours).       Radiology Studies: No results found.       Scheduled Meds:  ferrous sulfate   325 mg Oral Q breakfast   gabapentin   800 mg Oral TID   insulin  aspart  0-20 Units Subcutaneous TID WC   insulin  aspart  0-5 Units Subcutaneous QHS   lidocaine   1 patch Transdermal Q24H   pantoprazole   40 mg Oral BID   rivaroxaban   10 mg Oral Daily   sodium chloride  flush  3 mL Intravenous Q12H   sodium chloride  flush  3 mL Intravenous Q12H   Continuous Infusions:          Sophie Mao, MD [facility] 06/14/2024, 8:12 AM  Sounds "

## 2024-06-14 NOTE — Plan of Care (Signed)

## 2024-06-14 NOTE — Plan of Care (Signed)
